# Patient Record
Sex: Female | Born: 1935 | Race: Black or African American | Hispanic: No | Smoking: Former smoker
Health system: Southern US, Community
[De-identification: ages and names within clinical notes are randomized; demographics above are authoritative.]

## PROBLEM LIST (undated history)

## (undated) DIAGNOSIS — C3490 Malignant neoplasm of unspecified part of unspecified bronchus or lung: Secondary | ICD-10-CM

## (undated) DIAGNOSIS — M1990 Unspecified osteoarthritis, unspecified site: Secondary | ICD-10-CM

## (undated) DIAGNOSIS — R0602 Shortness of breath: Secondary | ICD-10-CM

## (undated) DIAGNOSIS — I739 Peripheral vascular disease, unspecified: Secondary | ICD-10-CM

## (undated) DIAGNOSIS — E785 Hyperlipidemia, unspecified: Secondary | ICD-10-CM

## (undated) DIAGNOSIS — Z923 Personal history of irradiation: Secondary | ICD-10-CM

## (undated) DIAGNOSIS — J449 Chronic obstructive pulmonary disease, unspecified: Secondary | ICD-10-CM

## (undated) DIAGNOSIS — C801 Malignant (primary) neoplasm, unspecified: Secondary | ICD-10-CM

## (undated) DIAGNOSIS — I639 Cerebral infarction, unspecified: Secondary | ICD-10-CM

## (undated) DIAGNOSIS — I10 Essential (primary) hypertension: Secondary | ICD-10-CM

## (undated) HISTORY — DX: Cerebral infarction, unspecified: I63.9

## (undated) HISTORY — DX: Chronic obstructive pulmonary disease, unspecified: J44.9

## (undated) HISTORY — DX: Hyperlipidemia, unspecified: E78.5

## (undated) HISTORY — DX: Essential (primary) hypertension: I10

## (undated) HISTORY — PX: TONSILLECTOMY: SUR1361

## (undated) HISTORY — PX: HEMORRHOID SURGERY: SHX153

## (undated) HISTORY — DX: Unspecified osteoarthritis, unspecified site: M19.90

## (undated) HISTORY — PX: ABOVE KNEE LEG AMPUTATION: SUR20

## (undated) HISTORY — DX: Peripheral vascular disease, unspecified: I73.9

---

## 2006-11-20 ENCOUNTER — Encounter: Admission: RE | Admit: 2006-11-20 | Discharge: 2006-11-20 | Payer: Self-pay | Admitting: Family Medicine

## 2007-06-23 ENCOUNTER — Emergency Department (HOSPITAL_COMMUNITY): Admission: EM | Admit: 2007-06-23 | Discharge: 2007-06-23 | Payer: Self-pay | Admitting: Emergency Medicine

## 2007-08-13 ENCOUNTER — Ambulatory Visit: Payer: Self-pay | Admitting: Vascular Surgery

## 2007-11-21 ENCOUNTER — Encounter: Admission: RE | Admit: 2007-11-21 | Discharge: 2007-11-21 | Payer: Self-pay | Admitting: Geriatric Medicine

## 2008-02-11 ENCOUNTER — Ambulatory Visit: Payer: Self-pay | Admitting: Vascular Surgery

## 2008-02-24 ENCOUNTER — Ambulatory Visit (HOSPITAL_COMMUNITY): Admission: RE | Admit: 2008-02-24 | Discharge: 2008-02-24 | Payer: Self-pay | Admitting: Vascular Surgery

## 2008-02-24 ENCOUNTER — Ambulatory Visit: Payer: Self-pay | Admitting: Vascular Surgery

## 2008-03-17 ENCOUNTER — Ambulatory Visit: Payer: Self-pay | Admitting: Vascular Surgery

## 2008-05-26 ENCOUNTER — Ambulatory Visit: Payer: Self-pay | Admitting: Vascular Surgery

## 2008-09-22 ENCOUNTER — Ambulatory Visit: Payer: Self-pay | Admitting: Vascular Surgery

## 2008-11-23 ENCOUNTER — Encounter: Admission: RE | Admit: 2008-11-23 | Discharge: 2008-11-23 | Payer: Self-pay | Admitting: Geriatric Medicine

## 2009-01-29 ENCOUNTER — Emergency Department (HOSPITAL_COMMUNITY): Admission: EM | Admit: 2009-01-29 | Discharge: 2009-01-29 | Payer: Self-pay | Admitting: Emergency Medicine

## 2009-02-09 ENCOUNTER — Ambulatory Visit: Payer: Self-pay | Admitting: Vascular Surgery

## 2009-03-16 ENCOUNTER — Ambulatory Visit: Payer: Self-pay | Admitting: Vascular Surgery

## 2009-03-22 ENCOUNTER — Ambulatory Visit (HOSPITAL_COMMUNITY): Admission: RE | Admit: 2009-03-22 | Discharge: 2009-03-22 | Payer: Self-pay | Admitting: Vascular Surgery

## 2009-03-22 ENCOUNTER — Ambulatory Visit: Payer: Self-pay | Admitting: Vascular Surgery

## 2009-03-22 HISTORY — PX: ANGIOPLASTY / STENTING ILIAC: SUR31

## 2009-04-22 ENCOUNTER — Ambulatory Visit: Payer: Self-pay | Admitting: Vascular Surgery

## 2009-10-07 ENCOUNTER — Ambulatory Visit: Payer: Self-pay | Admitting: Vascular Surgery

## 2009-12-08 ENCOUNTER — Encounter: Admission: RE | Admit: 2009-12-08 | Discharge: 2009-12-08 | Payer: Self-pay | Admitting: Geriatric Medicine

## 2010-01-14 ENCOUNTER — Ambulatory Visit: Payer: Self-pay | Admitting: Vascular Surgery

## 2010-02-17 ENCOUNTER — Ambulatory Visit: Payer: Self-pay | Admitting: Vascular Surgery

## 2010-04-29 ENCOUNTER — Emergency Department (HOSPITAL_COMMUNITY): Admission: EM | Admit: 2010-04-29 | Discharge: 2010-04-29 | Payer: Self-pay | Admitting: Emergency Medicine

## 2010-06-06 ENCOUNTER — Encounter: Admission: RE | Admit: 2010-06-06 | Discharge: 2010-06-06 | Payer: Self-pay | Admitting: Neurology

## 2010-07-19 ENCOUNTER — Emergency Department (HOSPITAL_COMMUNITY)
Admission: EM | Admit: 2010-07-19 | Discharge: 2010-07-19 | Payer: Self-pay | Source: Home / Self Care | Admitting: Emergency Medicine

## 2010-07-26 ENCOUNTER — Encounter
Admission: RE | Admit: 2010-07-26 | Discharge: 2010-07-26 | Payer: Self-pay | Source: Home / Self Care | Attending: Internal Medicine | Admitting: Internal Medicine

## 2010-08-03 ENCOUNTER — Ambulatory Visit
Admission: RE | Admit: 2010-08-03 | Discharge: 2010-08-03 | Payer: Self-pay | Source: Home / Self Care | Attending: Vascular Surgery | Admitting: Vascular Surgery

## 2010-08-20 ENCOUNTER — Encounter: Payer: Self-pay | Admitting: Neurology

## 2010-10-10 LAB — POCT CARDIAC MARKERS
CKMB, poc: <1 ng/mL — ABNORMAL LOW (ref 1.0–8.0)
CKMB, poc: <1 ng/mL — ABNORMAL LOW (ref 1.0–8.0)
Myoglobin, poc: 40.8 ng/mL (ref 12–200)
Myoglobin, poc: 66.2 ng/mL (ref 12–200)
Troponin i, poc: <0.05 ng/mL (ref 0.00–0.09)
Troponin i, poc: <0.05 ng/mL (ref 0.00–0.09)

## 2010-10-10 LAB — POCT I-STAT, CHEM 8
BUN: 6 mg/dL (ref 6–23)
Calcium, Ion: 1.14 mmol/L (ref 1.12–1.32)
Chloride: 105 mEq/L (ref 96–112)
Creatinine, Ser: 1.1 mg/dL (ref 0.4–1.2)
Glucose, Bld: 99 mg/dL (ref 70–99)
HCT: 42 % (ref 36.0–46.0)
Hemoglobin: 14.3 g/dL (ref 12.0–15.0)
Potassium: 4.2 mEq/L (ref 3.5–5.1)
Sodium: 138 mEq/L (ref 135–145)
TCO2: 27 mmol/L (ref 0–100)

## 2010-10-13 LAB — POCT I-STAT, CHEM 8
BUN: 8 mg/dL (ref 6–23)
Calcium, Ion: 1.18 mmol/L (ref 1.12–1.32)
Chloride: 104 mEq/L (ref 96–112)
Creatinine, Ser: 0.8 mg/dL (ref 0.4–1.2)
Glucose, Bld: 115 mg/dL — ABNORMAL HIGH (ref 70–99)
HCT: 44 % (ref 36.0–46.0)
Hemoglobin: 15 g/dL (ref 12.0–15.0)
Potassium: 4.6 mEq/L (ref 3.5–5.1)
Sodium: 139 mEq/L (ref 135–145)
TCO2: 26 mmol/L (ref 0–100)

## 2010-11-05 LAB — POCT I-STAT, CHEM 8
BUN: 9 mg/dL (ref 6–23)
Calcium, Ion: 1.22 mmol/L (ref 1.12–1.32)
Chloride: 105 mEq/L (ref 96–112)
Creatinine, Ser: 0.9 mg/dL (ref 0.4–1.2)
Glucose, Bld: 92 mg/dL (ref 70–99)
HCT: 46 % (ref 36.0–46.0)
Hemoglobin: 15.6 g/dL — ABNORMAL HIGH (ref 12.0–15.0)
Potassium: 4.3 mEq/L (ref 3.5–5.1)
Sodium: 138 mEq/L (ref 135–145)
TCO2: 26 mmol/L (ref 0–100)

## 2010-11-06 LAB — BASIC METABOLIC PANEL
BUN: 11 mg/dL (ref 6–23)
CO2: 28 mEq/L (ref 19–32)
Calcium: 9.1 mg/dL (ref 8.4–10.5)
Chloride: 104 mEq/L (ref 96–112)
Creatinine, Ser: 0.92 mg/dL (ref 0.4–1.2)
GFR calc Af Amer: >60 mL/min (ref >60)
GFR calc non Af Amer: 59 mL/min — ABNORMAL LOW (ref >60)
Glucose, Bld: 105 mg/dL — ABNORMAL HIGH (ref 70–99)
Potassium: 4.4 mEq/L (ref 3.5–5.1)
Sodium: 139 mEq/L (ref 135–145)

## 2010-11-06 LAB — DIFFERENTIAL
Basophils Absolute: 0.1 10*3/uL (ref 0.0–0.1)
Basophils Relative: 1 % (ref 0–1)
Eosinophils Absolute: 0 10*3/uL (ref 0.0–0.7)
Eosinophils Relative: 1 % (ref 0–5)
Lymphocytes Relative: 37 % (ref 12–46)
Lymphs Abs: 1.8 10*3/uL (ref 0.7–4.0)
Monocytes Absolute: 0.3 10*3/uL (ref 0.1–1.0)
Monocytes Relative: 7 % (ref 3–12)
Neutro Abs: 2.7 10*3/uL (ref 1.7–7.7)
Neutrophils Relative %: 55 % (ref 43–77)

## 2010-11-06 LAB — CBC
HCT: 42.9 % (ref 36.0–46.0)
Hemoglobin: 14.6 g/dL (ref 12.0–15.0)
MCHC: 34.1 g/dL (ref 30.0–36.0)
MCV: 92.3 fL (ref 78.0–100.0)
Platelets: 218 10*3/uL (ref 150–400)
RBC: 4.65 MIL/uL (ref 3.87–5.11)
RDW: 12.8 % (ref 11.5–15.5)
WBC: 5 10*3/uL (ref 4.0–10.5)

## 2010-11-06 LAB — APTT: aPTT: 30 seconds (ref 24–37)

## 2010-11-06 LAB — PROTIME-INR
INR: 1 (ref 0.00–1.49)
Prothrombin Time: 13.7 seconds (ref 11.6–15.2)

## 2010-11-06 LAB — GLUCOSE, CAPILLARY: Glucose-Capillary: 99 mg/dL (ref 70–99)

## 2010-11-06 LAB — CK TOTAL AND CKMB (NOT AT ARMC)
CK, MB: 1.5 ng/mL (ref 0.3–4.0)
Relative Index: INVALID (ref 0.0–2.5)
Total CK: 99 U/L (ref 7–177)

## 2010-11-06 LAB — TROPONIN I: Troponin I: <0.01 ng/mL (ref 0.00–0.06)

## 2010-11-07 ENCOUNTER — Other Ambulatory Visit: Payer: Self-pay | Admitting: Internal Medicine

## 2010-11-07 DIAGNOSIS — Z1231 Encounter for screening mammogram for malignant neoplasm of breast: Secondary | ICD-10-CM

## 2010-12-12 ENCOUNTER — Ambulatory Visit
Admission: RE | Admit: 2010-12-12 | Discharge: 2010-12-12 | Disposition: A | Discharge status: Home / Self Care | Payer: Medicare Other | Source: Ambulatory Visit | Attending: Internal Medicine | Admitting: Internal Medicine

## 2010-12-12 DIAGNOSIS — Z1231 Encounter for screening mammogram for malignant neoplasm of breast: Secondary | ICD-10-CM

## 2010-12-13 NOTE — Op Note (Signed)
NAME:  [name], [name]                  ACCOUNT NO.:  [id number]   MEDICAL RECORD NO.:  [id number]          PATIENT TYPE:  AMB   LOCATION:  SDS                          FACILITY:  [facility]   PHYSICIAN:  Di Kindle. Edilia Bo, M.D.DATE OF BIRTH:  Nov 14, 1927   DATE OF PROCEDURE:  02/24/2008  DATE OF DISCHARGE:  02/24/2008                               OPERATIVE REPORT   PREOPERATIVE DIAGNOSIS:  Rest pain of the left leg with multilevel  arterial occlusive disease.   POSTOPERATIVE DIAGNOSIS:  Rest pain of the left leg with multilevel  arterial occlusive disease.   PROCEDURES:  1. Ultrasound-guided access to the left common femoral artery.  2. Aortogram with left lower extremity runoff.  3. Percutaneous transluminal angioplasty and stent to the left common      iliac artery with a Genesis PG [id number] stent.   TECHNIQUE:  The patient was taken to the [facility].  The left groin was  prepped and draped in the usual sterile fashion.  After the skin was  anesthetized with 1% lidocaine and under ultrasound guidance, the left  common femoral artery was cannulated and a guidewire was introduced into  the left iliac artery.  A 5-French sheath was introduced over the wire.  The [facility] wire would not advance into the aorta.  I used an angled  Glidewire to advance the wire into the infrarenal aorta.  Pigtail  catheter was then advanced over the wire, and a flush aortogram was  obtained.  The catheter was then positioned above the aortic bifurcation  and an oblique iliac projection was obtained.  There was a tight 90-95%  proximal left common iliac artery plaque, which was fairly extensive but  originated just below the origin of the common iliac artery.  The 5-  French sheath was exchanged for a 7-French sheath and the patient  received 3000 units of IV heparin.  A PG 397 stent was selected, and  this was positioned across the stenosis.  The dilator was used to  advance the sheath through the stenosis.  The  dilator was then removed.  The stent was positioned appropriately, and then the sheath was  retracted.  The balloon was inflated to 8 atmospheres for 30 seconds.  The balloon was then deflated.  Completion film showed no residual  stenosis.  Left lower extremity runoff film was then obtained.   FINDINGS:  Single renal arteries bilaterally with no significant renal  artery stenosis identified.  The infrarenal aorta is widely patent.  The  right common iliac, right external iliac, and hypogastric arteries are  patent.  On the left side, there was extensive plaque in the proximal  left common iliac artery, which was successfully ballooned and stented  as above.  The hypogastric and external iliac artery on the left are  patent.  The common femoral and deep femoral artery are patent on the  left.  The proximal superficial femoral artery is patent, is then  occluded in the mid thigh with reconstitution of the popliteal artery at  the level of the knee on single-vessel runoff via the  peroneal artery.  There are extensive collaterals reconstituting the below-knee popliteal  artery.   CONCLUSIONS:  1. Successful PTA and stenting of significant left common iliac artery      stenosis.  2. Superficial femoral artery occlusion with reconstitution of the      below-knee popliteal artery and single-vessel runoff via the      peroneal artery.      Di Kindle. Edilia Bo, M.D.  Electronically Signed     CSD/MEDQ  D:  02/24/2008  T:  02/25/2008  Job:  [id number]   cc:   Tanya D. Daphine Deutscher, M.D.

## 2010-12-13 NOTE — Procedures (Signed)
LOWER EXTREMITY ARTERIAL EVALUATION-SINGLE LEVEL   INDICATION:  Follow-up evaluation of left iliac PTA and stent.  Patient  denies claudication and rest pain.   HISTORY:  Diabetes:  No.  Cardiac:  No.  Hypertension:  Yes.  Smoking:  No.  Previous Surgery:  Left [facility] PTA and stent on 02/24/08 by Dr. Edilia Bo.  Patient has prosthetic right leg.   RESTING SYSTOLIC PRESSURES: (ABI)                          RIGHT                LEFT  Brachial:               134                  136  Anterior tibial:                             66  Posterior tibial:                            68 (0.50)  Peroneal:                                    64  DOPPLER WAVEFORM ANALYSIS:  Anterior tibial:                             Monophasic  Posterior tibial:                            Monophasic  Peroneal:                                    Monophasic   PREVIOUS ABI'S:  Date:  02/11/08  RIGHT:  LEFT:  0.35   IMPRESSION:  1. Patient with above knee amputation.  2. Left ankle brachial index is consistent with moderate-      moderate/severe arterial compromise, status post surgery, but has      increased from study on 02/11/08.   ___________________________________________  Di Kindle. Edilia Bo, M.D.   PB/MEDQ  D:  03/17/2008  T:  03/17/2008  Job:  [id number]

## 2010-12-13 NOTE — Assessment & Plan Note (Signed)
OFFICE VISIT   [name], [name]  DOB:  [birth year]                                       03/17/2008  ZOXWR#:[id number]   The patient has had a previous right above knee amputation and I have  been following her peripheral vascular disease on the left.  She  developed rest pain in the left foot and on 02/24/2008 underwent an  arteriogram which showed a 90% left common iliac artery stenosis which  was successfully ballooned and stented.  She was placed on Plavix for 3  months.  She also has a superficial femoral artery occlusion on the left  with extensive collaterals reconstituting with below knee popliteal  artery with single vessel runoff via the peroneal artery.  She comes in  for a followup visit.   She states that her symptoms in her left leg have improved markedly  since her PTA and stenting.  She is able to walk in the grocery store  without problems and has had no real claudication or rest pain.   PHYSICAL EXAMINATION:  On physical examination she has a palpable  femoral pulse on the left and good Doppler flow in the dorsalis pedis  position.   Overall, I am pleased with her progress.  Her ABI has gone from 35% to  50%.  I think it is unlikely she will require infrainguinal bypass.  I  will see her back in 6 months.  She knows to call sooner if she has  problems.   Di Kindle. Edilia Bo, M.D.  Electronically Signed   CSD/MEDQ  D:  03/17/2008  T:  03/18/2008  Job:  [id number]

## 2010-12-13 NOTE — Assessment & Plan Note (Signed)
OFFICE VISIT   [name], [name]  DOB:  November 04, 1927                                       01/14/2010  EAVWU#:[id number]   REASON FOR VISIT:  Left foot pain.   Patient is an 77 year old black female with a history of hypertension  and hypercholesterolemia.  She also has peripheral vascular disease with  known SFA occlusion.  She has had a PTA and stenting of her left common  iliac artery first in July 2009 for a tight 90% to 95% proximal stenosis  and then again in 03/22/2009.  On arteriogram, she had single vessel  runoff via her peroneal artery.  She was last seen by Dr. Edilia Bo in  04/22/2009, and ABI at that time was 60%,  up from 52%.  She also was  seen for a vascular lab study on 10/07/2009, and ABI at that time was  stable at 0.57.  She reports that around that time or just prior to  that, she began having left foot pain.  She felt that her symptoms  correlated with a new orthotic shoe that she has been wearing.  The pain  is somewhat progressed, and she describes a sharp pain on the top of her  left foot which occasionally radiates around her ankle.  She has mild  point tenderness on the dorsum of her left foot.  She says the pain is  fairly constant and does not necessarily seem worse with walking or with  rest.  She denies any known injury.  She takes Tylenol as needed for  pain with overall relief.  She has not had this type of pain before  March or February of this year.  She continues to have mild calf  cramping with ambulation only.  She does occasionally dangle at her  bedside during the night from her foot pain but does not notice a  significant change with the dependent position.  She has no open she has  no ulcerations.  Of note, she does have a history of right above-knee  amputation.  By her history, this was secondary to a right leg gangrene.   She is widowed.  She relies on public transportation.  She has 1 child.  She quit smoking in  2010.  She denies any premature cardiovascular  disease in her first-degree relatives.   REVIEW OF SYSTEMS:  Positive for weight gain, history of TIA, dizziness,  headaches and joint and muscle pain.  Apparently she said Dr. Edilia Bo  took her off Plavix, as she had some blood in her some stools, although  I did not see documentation of this.  She denies any recent  hematochezia.   ABIs today showed 0.61, which was no significant change from her prior  ABI in March 2011.  There was moderate to  severe decrease in the left  toe brachial index with a pulsatile PPG waveform of the left great toe  noted.   Physical exam findings show a heart rate of 66, respirations 20, oxygen  saturation 99% on room air.  She is a well-developed, pleasant female in  no acute distress.  Head is normocephalic atraumatic.  Her abdomen is  soft.  Her extremities show a 2+ femoral pulse in the left.  Evidence of  a right above-knee amputation.  Her left foot is warm with motor and  sensation  intact.  She does report some intermittent numbness of her  first to third toes on the left.  She has monophasic peroneal, posterior  tibial, and anterior tibial Doppler signals.  No ulcerations are noted.  On the dorsum of her left foot, there is a bony prominence just distal  to the ankle.  There is no surrounding erythema.  It is approximately 2  x 2 cm in size.  It is mildly tender to palpation.   Patient has moderately decreased left ABI, but this has remained stable  since September of last year.  She continues to have mild left calf  claudication symptoms but now presents with about a 3-56-month history of  left foot pain.  By her description, it does not seem exactly typical of  left foot rest pain, although the pain is fairly constant.  There does  not seem to be a significant relief in her symptoms with dependent  position of her legs, however.  She also reports that there is a  correlation between getting a new  orthotic shoe, and she does have point  tenderness on the dorsum of her foot.  Subsequently, I feel that  something other than her peripheral vascular disease may be contributing  to her left foot pain.  She is already established with a podiatrist,  Dr. Irving Shows at [facility].  I did called their office and  got her an appointment to see Dr. Wynelle Cleveland this coming Wednesday, June  22, at 1:45 p.m.  I will defer further workup of her left foot pain  until they have ruled out any orthopedic causes.  If her workup at the  podiatrist is negative, then I do feel that Dr. Edilia Bo will need to  reevaluate her and decide if he feels she would benefit for further  revascularization.  Per his last note, he felt that if her symptoms  progressed, she may need a left femoral-popliteal bypass grafting.   At this point she is supposed to see Dr. Edilia Bo in 1 month.  She can  continue as scheduled.  However, if the podiatry workup is negative, she  can call and set up an appointment sooner.   Jerold Coombe, PA   Larina Earthly, M.D.  Electronically Signed   AWZ/MEDQ  D:  01/14/2010  T:  01/14/2010  Job:  [id number]   cc:   Marlowe Aschoff, DPM

## 2010-12-13 NOTE — Assessment & Plan Note (Signed)
OFFICE VISIT   [name], [name]  DOB:  1928/06/15                                       02/11/2008  EAVWU#:[id number]   I saw the patient in the office today for continued followup of her  peripheral vascular disease.  She has had a previous right above knee  amputation and I have been following her multilevel arterial occlusive  disease in the left.  I had initially seen her in consultation on  January 13 of this year.  She comes in for a routine followup visit.  Since I saw her last she continues to have some moderate rest pain in  the left foot which appears to be quite tolerable and has not changed  since January.  She also has a slight callus on her left second toe but  no open ulcers.   REVIEW OF SYSTEMS:  On review of systems she has had no recent chest  pain, chest pressure, palpitations or arrhythmias.  She has had no  bronchitis, asthma or wheezing.  She has had no fever or chills.   PHYSICAL EXAMINATION:  General:  On physical examination this is a  pleasant 77 year old woman who appears her stated age.  Vital signs:  Blood pressure is 179/76, heart rate is 73.  Neck:  Neck is supple.  I  do not detect any carotid bruits.  Lungs:  Are clear bilaterally to  auscultation.  Cardiac:  She has a regular rate and rhythm.  Abdomen:  Soft and nontender.  She has a slightly diminished left femoral pulse.  I cannot palpate popliteal or pedal pulses on the left.  She has no open  ulcers on her left foot.  Her foot appears adequately perfused.   Doppler studies in our office today showed monophasic Doppler signals in  the left foot in a posterior tibial and dorsalis pedis position with an  ABI of 35% which has not changed significantly since January.   I have encouraged her to keep her skin well lubricated to prevent any  cracks.  I have encouraged her to stay as active as possible.  She  cannot take aspirin as she has a significant allergy from this.  I  plan  on seeing her back in 1 year.  She knows to call sooner if she has  problems.   Di Kindle. Edilia Bo, M.D.  Electronically Signed   CSD/MEDQ  D:  02/11/2008  T:  02/12/2008  Job:  [id number]

## 2010-12-13 NOTE — Assessment & Plan Note (Signed)
OFFICE VISIT   [name], [name]  DOB:  [birth year]                                       02/17/2010  EAVWU#:[id number]   I saw the patient in the office today for continued followup of her  peripheral vascular disease.  She has undergone a previous right above  the knee amputation and on the left side she has had PTA and stenting of  the left common iliac artery most recently in August of 2010.  She is  unable to take aspirin or Plavix because of bleeding problems in the  past.  She comes in for a routine visit.  She states that her biggest  complaint has been she has been having problems with her right above the  knee prosthesis and is frustrated with the company that she is working  with on this.  She would like referral to a different prosthetist.  In  essence, she is not happy with how she has to ambulate with her  prosthesis and they have not been willing to accommodate her concerns.  She does experience some mild claudication in the left calf which is  brought on by ambulation and relieved with rest.  She has had no rest  pain in the left foot and no nonhealing ulcers.   REVIEW OF SYSTEMS:  CARDIAC:  She has had no chest pain, chest pressure,  palpitations or arrhythmias.   PHYSICAL EXAMINATION:  General:  This is a pleasant 77 year old woman  who appears her stated age.  Vital signs:  Temperature is 98.  Blood  pressure is 180/71, heart rate is 71.  Lungs:  Are clear bilaterally to  auscultation.  Her right AKA has healed well.  On the left side she has  a normal femoral pulse.  I cannot palpate a popliteal or pedal pulse.  Based on previous arteriograms she has single vessel runoff via the  peroneal artery on the left side.  She also has a known superficial  femoral artery occlusion.   Her most recent Doppler study was on 01/14/2010 and showed an ABI of 61%  on the right which is stable compared to a previous ABI of 57%.  This is  consistent with her  SFA occlusion and tibial occlusive disease.  I have  scheduled her for a followup ABI in 6 months so we can continue to  follow her stent in the left common iliac artery.  She knows to call  sooner if she has problems.  In the meantime I have given her a  prescription for a new prosthetist at [employer].  Of note, she also quit  smoking 1 year ago.     Di Kindle. Edilia Bo, M.D.  Electronically Signed   CSD/MEDQ  D:  02/17/2010  T:  02/18/2010  Job:  [id number]   cc:   Marlowe Aschoff, DPM

## 2010-12-13 NOTE — Assessment & Plan Note (Signed)
OFFICE VISIT   [name], [name]  DOB:  [birth year]                                       09/22/2008  ZOXWR#:[id number]   I saw the patient in the office today for continued followup of her  peripheral vascular disease.  She has had a previous right below-the-  knee amputation.  In July of 2009 she had a PTA and stenting of the left  common iliac artery.  She has a superficial femoral artery occlusion  below this.  She has single vessel runoff on the left via the peroneal  artery.  She comes in for a routine followup visit.  She has had no  claudication or rest pain in the left leg.  She has been ambulating with  her prosthesis.  She has had no nonhealing ulcers.  Her only complaint  is some occasional heel pain.   REVIEW OF SYSTEMS:  She has had no recent chest pain, chest pressure,  palpitations or arrhythmias.  She has had no bronchitis, asthma or  wheezing.   PHYSICAL EXAMINATION:  General:  This is a pleasant 77 year old woman  who appears her stated age.  Vital signs:  Her blood pressure is 129/71,  heart rate is 65.  Lungs:  Are clear bilaterally to auscultation.  Cardiac:  She has a regular rate and rhythm.  She has a palpable left  femoral pulse with a warm, well-perfused left foot.  She has the  amputation on the right side.  She has no ischemic ulcers.   Duplex Doppler study in our office today shows an ABI of 55% on the left  which is not changed compared to her study in October of 2009.  She has  some mildly elevated velocities in the common iliac artery looks like  above her stent.  Will continue to follow her stent on our protocol.  If  her velocities increase significantly we would potentially have to  restudy her.  However, currently the velocities are only mildly  elevated.  I will see her back in 6 months.  She is on Plavix.   Di Kindle. Edilia Bo, M.D.  Electronically Signed   CSD/MEDQ  D:  09/22/2008  T:  09/23/2008  Job:  [id number]

## 2010-12-13 NOTE — Assessment & Plan Note (Signed)
OFFICE VISIT   [name], [name]  DOB:  01/12/1928                                       03/16/2009  ZOXWR#:[id number]   I saw the patient in the office today for continued followup of her  peripheral vascular disease.  This is a pleasant 77 year old woman who  had undergone PTA and stenting of a left common iliac artery stenosis on  02/24/2008.  This was for a tight 90-95% proximal left common iliac  artery stenosis which originated below the origin of the common iliac  artery.  This was addressed with a [id number] stent with an excellent result.  On her most recent followup study in February she was noted to have some  increased velocities in the common iliac artery and comes in for a  followup study today.  Of note, she is ambulatory with her prosthesis  which she has for her right leg.  She has not had any significant  claudication on the left although I think her activities are fairly  limited because she has been having some problems with her prosthesis.  She denies any history of rest pain or history of nonhealing ulcers on  the left.   REVIEW OF SYSTEMS:  She has had no recent chest pain, chest pressure,  palpitations or arrhythmias.  She has had no productive cough,  bronchitis, asthma or wheezing.   MEDICATIONS:  Of note, her medications do include Plavix.  They are also  listed on the medical history form in her chart.   PHYSICAL EXAMINATION:  This is a pleasant 77 year old woman who appears  her stated age.  Her blood pressure is 161/65, heart rate is 87.  Lungs  are clear bilaterally to auscultation.  On cardiac exam she has a  regular rate and rhythm.  Her abdomen  is soft and nontender.  She has  palpable femoral pulses.  I cannot palpate a popliteal or pedal pulses  on the left side.  On the right side she has an above the knee  amputation.   Today her ankle brachial index on the left has dropped slightly to 52%  from 55%.  She has a known  superficial femoral artery occlusion.  She  has elevated velocities in the proximal common iliac artery to 281  cm/sec and in the mid common iliac artery to 313 cm/sec.  There are no  significant elevated velocities in the external iliac artery.   Given the progression of the recurrent stenosis on the left side and a  drop in ABI I have recommend we proceed with arteriography and possible  left iliac artery angioplasty and stenting for her either recurrent  stenosis within the stent or development of a new stenosis adjacent to  the stent.  This has been scheduled for 03/22/2009.  We have discussed  the indications for the procedure and the potential complications.  All  of her questions were answered and she is agreeable to proceed.   Di Kindle. Edilia Bo, M.D.  Electronically Signed   CSD/MEDQ  D:  03/16/2009  T:  03/17/2009  Job:  [id number]

## 2010-12-13 NOTE — Assessment & Plan Note (Signed)
OFFICE VISIT   [name], [name]  DOB:  1927-12-06                                       04/22/2009  UEAVW#:[id number]   I saw the patient in the office today for followup after her recent PTA  and stenting of the left common iliac artery.  This a pleasant 77-year-  old woman who had a previous amputation on the right side who had  presented with left lower extremity claudication.  She had PTA and  stenting of the left common iliac artery in July of 2009 for a tight 90-  95% proximal left common iliac artery stenosis.  On a recent followup  study she was noted to have some increased velocities within the iliac  artery and she underwent an arteriogram on 03/22/2009 and was found to  have a recurrent left common iliac artery stenosis which was  successfully ballooned and stented.  On the left side she has a  superficial femoral artery occlusion with single vessel runoff via the  peroneal artery.  She comes in for a followup study.   PHYSICAL EXAMINATION:  Blood pressure is 154/76, heart rate is 72.  Lungs are clear bilaterally to auscultation.  On cardiac exam she has a  regular rate and rhythm.  She has a palpable femoral pulse on the left.  The left foot appears warm and adequately perfused without ischemic  ulcer.  ABI on the left has increased to 60% from 52% preoperatively.   Currently her symptoms are quite tolerable.  She has some mild  claudication in the left leg with a known superficial femoral artery  occlusion.  I would only consider left fem to below knee pop bypass  grafting if she developed progressive ischemia, rest pain or nonhealing  ulcer.  I plan on seeing her back in 6 months with followup ABIs.  She  knows to call sooner if she has problems.  Of note, she is on Plavix.   [name]. Edilia Bo, M.D.  Electronically Signed   CSD/MEDQ  D:  04/22/2009  T:  04/23/2009  Job:  [id number]

## 2010-12-13 NOTE — Assessment & Plan Note (Signed)
OFFICE VISIT   [name], Shilee F  DOB:  26-Aug-1927                                       05/26/2008  EAVWU#:[id number]   I saw the patient in the office today for continued followup of her  peripheral vascular disease.  She has undergone a PTA and stent of the  left common iliac artery.  Since I saw her last she has had no  claudication or rest pain on the left leg.  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  She  does complain of some pain in the stump on the right AKA.  This is  something that is relatively new.   REVIEW OF SYSTEMS:  On review of systems she has had no recent chest  pain, chest pressure, palpitations or arrhythmias.  She has had no  bronchitis, asthma or wheezing.   PHYSICAL EXAMINATION:  General:  On physical examination this is a  pleasant 77 year old woman who appears her stated age.  Vital signs:  Her blood pressure is 140/84, heart rate is 84.  Temperature 97.9.  Lungs:  Lungs are clear bilaterally to auscultation.  Cardiac:  She has  a regular rate and rhythm.  She has a palpable femoral pulse on the left  with a warm and well-perfused left foot.  No ischemic ulcers.  Her right  AKA looks fine.  I do not see any erythema or swelling.  There is good  soft tissue coverage over the bone.   I have recommended that she talk with [employer] about perhaps adjusting  her prosthesis as I do not see any problems with the amputation site  itself.  I will continue to follow her left iliac stent closely when she  is due back in 3 months.  I will see her back at that time.  She knows  to call sooner if she has any problems.  I have also given her a  prescription for mild compression stocking for the left leg.   Di Kindle. Edilia Bo, M.D.  Electronically Signed   CSD/MEDQ  D:  05/26/2008  T:  05/27/2008  Job:  [id number]

## 2010-12-13 NOTE — Procedures (Signed)
AORTA-ILIAC DUPLEX EVALUATION   INDICATION:  Follow up left common iliac artery stent.   HISTORY:  Diabetes:  No.  Cardiac:  No.  Hypertension:  Yes.  Smoking:  No.  Previous Surgery:  Left common iliac artery PTA and stent on 02/24/2008,  history of right above-knee amputation and left superficial femoral  artery occlusion.               SINGLE LEVEL ARTERIAL EXAM                              RIGHT                  LEFT  Brachial:                  156                    140  Anterior tibial:                                  86  Posterior tibial:                                 83  Peroneal:  Ankle/brachial index:                             0.55  Previous ABI/date:                                On 05/26/2008, 0.5   AORTA-ILIAC DUPLEX EXAM  Aorta - Proximal     51 cm/s  Aorta - Mid          69 cm/s  Aorta - Distal       61 cm/s   RIGHT                                   LEFT                    CIA-PROXIMAL          201 cm/s                    CIA-DISTAL            171 cm/s                    HYPOGASTRIC           Not visualized                    EIA-PROXIMAL          131 cm/s                    EIA-MID               102 cm/s                    EIA-DISTAL            132 cm/s   IMPRESSION:  1. Patent left common iliac artery stent with an increased velocity  of      201 cm/s noted in the proximal left common iliac artery.  2. Stable left ABI noted.  3. Decreased visualization of the aortoiliac system due to overlying      bowel gas patterns.   ___________________________________________  Di Kindle. Edilia Bo, M.D.   CH/MEDQ  D:  09/22/2008  T:  09/22/2008  Job:  [id number]

## 2010-12-13 NOTE — Consult Note (Signed)
NEW PATIENT CONSULTATION   [name], [name]  DOB:  1927-11-27                                       08/13/2007  JXBJY#:[id number]   HISTORY:  I saw the patient in the office today concerning her  peripheral vascular disease.  She was referred by Dr. Cephas Darby. Daphine Deutscher.  This is a pleasant 77 year old woman who had a right above the knee  amputation in the late 80s for gangrene of the right leg.  Since that  time she has been ambulatory with a prosthesis.  She denies any  claudication in the left calf and has had no rest pain in the left foot.  She does get some pain in her heel at night.  She has had no open ulcers  on the left foot.   PAST MEDICAL HISTORY:  Significant for hypertension,  hypercholesterolemia, glaucoma.  She denies any history of diabetes,  history of previous myocardial infarction, history of congestive heart  failure or history of COPD.   FAMILY HISTORY:  There is no history of premature cardiovascular disease  and no family history of diabetes.   SOCIAL HISTORY:  She is widowed.  She has 1 daughter.  She has smoked  for as long as she can remember, although she quit for about a year  after she had her amputation.  She smokes a pack per week.   REVIEW OF SYSTEMS:  Documented on the medical history form in her chart.   MEDICATIONS:  Documented on the medical history form in her chart.   PHYSICAL EXAMINATION:  General:  This is a pleasant 77 year old woman  who appears her stated age.  Vital signs:  Blood pressure is 166/71,  heart rate is 68.  HEENT:  Extraocular motions are intact.  There is no  cervical lymphadenopathy.  Neck:  Her neck is supple.  I do not detect  any carotid bruits.  Lungs:  Clear bilaterally to auscultation.  Cardiac:  She has a regular rate and rhythm.  Abdomen:  Soft and  nontender.  I could not palpate an aneurysm.  I could not palpate a  right femoral pulse.  She has a slightly diminished left femoral pulses.  I could  not palpable popliteal or pedal pulses on the left side.  She  has an AKA on the right.  She does have monophasic Doppler signals in  the posterior tibial and anterior tibial position on the left.  Vascular  study in our office today shows an ABI of 38% on the left.  She has no  open wound on her left foot.  She has no significant lower extremity  swelling.  Neurological:  Nonfocal.   This patient has evidence of multilevel arterial occlusive disease but  currently her symptoms are quite tolerable and she has no evidence of  limb threatening ischemia.  If she developed rest pain or nonhealing  ulcer we could consider arteriography.  She might potentially have an  iliac stenosis which is amenable to angioplasty.  However, currently I  would not recommend vascular intervention unless her symptoms progress.  I plan on seeing her back in 6 months.  She knows to call sooner if she  has problems.   Di Kindle. Edilia Bo, M.D.  Electronically Signed   CSD/MEDQ  D:  08/13/2007  T:  08/14/2007  Job:  [id number]  cc:   Tripp, Dr  Cephas Darby. Daphine Deutscher, M.D.

## 2010-12-13 NOTE — Procedures (Signed)
AORTA-ILIAC DUPLEX EVALUATION   INDICATION:  Follow up left common iliac artery stent.   HISTORY:  Diabetes:  No.  Cardiac:  No.  Hypertension:  Yes.  Smoking:  No.  Previous Surgery:  Left [facility] PTA/stent, 02/24/08.  History of right AKA.               SINGLE LEVEL ARTERIAL EXAM                              RIGHT                  LEFT  Brachial:                  144                    142  Anterior tibial:                                  75  Posterior tibial:                                 68  Peroneal:  Ankle/brachial index:      AKA                    0.52  Previous ABI/date:         09/22/08, AKA          09/22/08, 0.55   AORTA-ILIAC DUPLEX EXAM  Aorta - Proximal     79 cm/s  Aorta - Mid          74 cm/s  Aorta - Distal       52 cm/s   RIGHT                                   LEFT                    CIA-PROXIMAL          P = 281, M = 313                    CIA-DISTAL            124 cm/s                    HYPOGASTRIC           134 cm/s                    EIA-PROXIMAL          97 cm/s                    EIA-MID               126 cm/s                    EIA-DISTAL            114 cm/s   IMPRESSION:  1. Technically difficult study due to non-NPO and bowel gas.  2. Right above-the-knee amputation.  3. Left ankle brachial index appears stable from previous study.  4. Increase in left common iliac artery velocities suggestive  of >50%      stenosis.  5. Patent left common iliac artery stent.   ___________________________________________  Di Kindle. Edilia Bo, M.D.   AS/MEDQ  D:  02/09/2009  T:  02/09/2009  Job:  [id number]

## 2010-12-13 NOTE — Op Note (Signed)
NAME:  [name], Nicole                  ACCOUNT NO.:  [id number]   MEDICAL RECORD NO.:  [id number]          PATIENT TYPE:  AMB   LOCATION:  SDS                          FACILITY:  [facility]   PHYSICIAN:  Di Kindle. Edilia Bo, M.D.DATE OF BIRTH:  10/03/1927   DATE OF PROCEDURE:  DATE OF DISCHARGE:  03/22/2009                               OPERATIVE REPORT   PREOPERATIVE DIAGNOSIS:  Left common iliac artery stenosis with  multilevel arterial occlusive disease.   POSTOPERATIVE DIAGNOSIS:  Left common iliac artery stenosis with  multilevel arterial occlusive disease.   PROCEDURE:  1. Ultrasound-guided access to the left common femoral artery.  2. Aortogram with left lower extremity runoff.  3. Percutaneous transluminal angioplasty and stenting of the left      common iliac artery with a [id number] Cordis stent with postdilatation      with an 8 x 2 balloon.   SURGEON:  Di Kindle. Edilia Bo, MD   ANESTHESIA:  Local with sedation.   TECHNIQUE:  The patient was taken to the [facility] and received a milligram  of Versed.  After this, the left groin was prepped and draped in the  usual sterile fashion.  After the skin was anesthetized with 1%  lidocaine and under ultrasound guidance the left common femoral artery  was cannulated and a guidewire introduced into the infrarenal aorta  under fluoroscopic control.  A 5-French sheath was introduced over the  wire.  A pigtail catheter was positioned at the L1 vertebral body and  flush aortogram obtained.  The catheter was then repositioned above the  aortic bifurcation and an oblique iliac projection was obtained.  There  appeared to be at approximately 50% stenosis just proximal to the  previous common iliac artery stent.  There was also some irregularity  within the proximal stent.  I elected to balloon and re-stent this area.  I selected a Cordis PG [id number] stent which was selected and the 5-French  sheath was exchanged for a long 6-French sheath and then  the patient  received 2000 units of IV heparin.  The stent was positioned right at  the proximal left common iliac artery extending into the previous stent.  The sheath was retracted.  The stent was deployed to 8 atmospheres for  30 seconds.  I then did a completion run and elected to balloon with an  8 mm balloon within the stented areas.  This was done with 8 atmospheres  for 30 seconds in several areas.  Completion arteriogram showed an  excellent result with no residual stenosis.  Next, left lower extremity  runoff film was obtained.   FINDINGS:  1. Proximal left common iliac artery stenosis and proximal in-stent      stenosis on the left which was successfully ballooned and stented      as described above.  2. The left external and common femoral and deep femoral arteries are      patent.  The superficial femoral artery is occluded in the proximal      thigh, and there is reconstitution at the popliteal artery at  the      level of the knee.  There is single-vessel runoff on the left via      the peroneal artery.  On the right side the common iliac and      external iliac artery are patent.  The common femoral artery then      becomes quite diminutive in size.   CONCLUSIONS:  1. Successful PTA and stenting of the left common iliac artery.  2. Superficial femoral artery occlusion with single-vessel runoff via      the peroneal artery on the left.      Di Kindle. Edilia Bo, M.D.  Electronically Signed     CSD/MEDQ  D:  03/22/2009  T:  03/22/2009  Job:  [id number]

## 2011-02-16 ENCOUNTER — Encounter: Payer: Self-pay | Admitting: Vascular Surgery

## 2011-02-17 ENCOUNTER — Encounter: Payer: Self-pay | Admitting: Vascular Surgery

## 2011-02-22 ENCOUNTER — Ambulatory Visit (INDEPENDENT_AMBULATORY_CARE_PROVIDER_SITE_OTHER): Payer: Medicare Other | Admitting: Vascular Surgery

## 2011-02-22 ENCOUNTER — Encounter (INDEPENDENT_AMBULATORY_CARE_PROVIDER_SITE_OTHER): Payer: Medicare Other

## 2011-02-22 VITALS — BP 156/80 | HR 68 | Temp 97.9°F | Ht 64.0 in | Wt 154.0 lb

## 2011-02-22 DIAGNOSIS — I739 Peripheral vascular disease, unspecified: Secondary | ICD-10-CM

## 2011-02-22 DIAGNOSIS — I70219 Atherosclerosis of native arteries of extremities with intermittent claudication, unspecified extremity: Secondary | ICD-10-CM

## 2011-02-22 DIAGNOSIS — Z48812 Encounter for surgical aftercare following surgery on the circulatory system: Secondary | ICD-10-CM

## 2011-02-22 NOTE — Progress Notes (Signed)
Subjective:     Patient ID: [name], female   DOB: [birth year], 77 y.o.   MRN: [id number]  HPI I saw this patient in the office today with a chief complaint of cramps in the left foot. She was referred by Dr. Marlowe Aschoff. The cramps began approximately 3 months ago. They were gradual in onset. Her symptoms, come and go. She underwent a right above-knee amputation in [place] in the 80s. She is ambulatory with her prosthesis, although she has been having problems lately with the function of her prosthesis. I do not get any clear-cut history of claudication in the left leg, rest pain, or nonhealing ulcers in the left foot.  Of note this patient has had a previous common iliac artery angioplasty and stent placed and he is on Plavix.  Review of Systems  Constitutional: Negative for fever and chills.  Respiratory: Negative for chest tightness and shortness of breath.   Cardiovascular: Negative for chest pain and palpitations.       Objective:   Physical Exam  Constitutional: She is oriented to person, place, and time.  Neck: Neck supple. No JVD present. No thyromegaly present.  Cardiovascular: Normal rate, regular rhythm and normal heart sounds.  Exam reveals no friction rub.   No murmur heard. Pulses:      Radial pulses are 2+ on the right side, and 2+ on the left side.       Femoral pulses are 2+ on the right side, and 2+ on the left side.      Popliteal pulses are 0 on the left side.       Dorsalis pedis pulses are 0 on the left side.       Posterior tibial pulses are 0 on the left side.       She has a right above-the-knee amputation.  Pulmonary/Chest: Breath sounds normal. She has no wheezes. She has no rales.  Abdominal: Soft. Bowel sounds are normal. There is no tenderness.       No palpable aneurysm detected.  Musculoskeletal: She exhibits no edema.  Lymphadenopathy:    She has no cervical adenopathy.  Neurological: She is alert and oriented to person, place, and time.  She has normal strength. No sensory deficit.  Skin: No lesion and no rash noted.       Assessment:     I have independently interpreted her arterial Doppler study today which shows that she has an ankle-brachial index of 0.66 on the left. She has a biphasic dorsalis pedis and posterior tibial signal.  I have reviewed her previous arteriogram which shows that she has a known left superficial femoral artery occlusion with single vessel runoff via the peroneal artery on the left.  I believe that she has stable infrainguinal arterial occlusive disease. I do not think any further vascular workup is indicated at this time. I have recommended a followup at-year-old Doppler study in one year and I have ordered this study. Fortunately, she quit smoking 2 years ago. I have encouraged her to stay as active as possible.    Plan:       Followup in one year with ankle-brachial indices. The patient knows to call sooner if she has problems.

## 2011-02-22 NOTE — Progress Notes (Unsigned)
Pt c/o increase cramping in Left foot.   Prosthetic problems with fitting on Left AKA ( Went to [employer]) Pt states she fell in February and her leg has been hurting since, no xrays or treatment done then.

## 2011-04-28 LAB — POCT I-STAT, CHEM 8
BUN: 13
Calcium, Ion: 1.11 — ABNORMAL LOW
Chloride: 104
Creatinine, Ser: 1
Glucose, Bld: 99
HCT: 48 — ABNORMAL HIGH
Hemoglobin: 16.3 — ABNORMAL HIGH
Potassium: 3.3 — ABNORMAL LOW
Sodium: 139
TCO2: 25

## 2011-05-09 LAB — URINALYSIS, ROUTINE W REFLEX MICROSCOPIC
Bilirubin Urine: NEGATIVE
Glucose, UA: NEGATIVE
Hgb urine dipstick: NEGATIVE
Ketones, ur: NEGATIVE
Nitrite: NEGATIVE
Protein, ur: NEGATIVE
Specific Gravity, Urine: 1.013
Urobilinogen, UA: 0.2
pH: 6

## 2011-05-09 LAB — DIFFERENTIAL
Basophils Absolute: 0
Basophils Relative: 1
Eosinophils Absolute: 0 — ABNORMAL LOW
Eosinophils Relative: 1
Lymphocytes Relative: 33
Lymphs Abs: 2.3
Monocytes Absolute: 0.3
Monocytes Relative: 5
Neutro Abs: 4.3
Neutrophils Relative %: 61

## 2011-05-09 LAB — CBC
HCT: 44.3
Hemoglobin: 14.6
MCHC: 33.1
MCV: 90.8
Platelets: 218
RBC: 4.88
RDW: 12.7
WBC: 6.9

## 2011-05-09 LAB — BASIC METABOLIC PANEL
BUN: 14
CO2: 28
Calcium: 9.2
Chloride: 109
Creatinine, Ser: 1.06
GFR calc Af Amer: >60
GFR calc non Af Amer: 50 — ABNORMAL LOW
Glucose, Bld: 98
Potassium: 4.5
Sodium: 143

## 2011-08-17 ENCOUNTER — Other Ambulatory Visit: Payer: Self-pay | Admitting: Internal Medicine

## 2011-08-17 ENCOUNTER — Ambulatory Visit
Admission: RE | Admit: 2011-08-17 | Discharge: 2011-08-17 | Disposition: A | Discharge status: Home / Self Care | Payer: Medicare Other | Source: Ambulatory Visit | Attending: Internal Medicine | Admitting: Internal Medicine

## 2011-08-17 DIAGNOSIS — G8929 Other chronic pain: Secondary | ICD-10-CM

## 2011-09-01 HISTORY — PX: COLONOSCOPY: SHX174

## 2011-10-04 ENCOUNTER — Ambulatory Visit (INDEPENDENT_AMBULATORY_CARE_PROVIDER_SITE_OTHER): Payer: Medicare Other | Admitting: Cardiology

## 2011-10-04 ENCOUNTER — Encounter: Payer: Self-pay | Admitting: Cardiology

## 2011-10-04 DIAGNOSIS — I10 Essential (primary) hypertension: Secondary | ICD-10-CM

## 2011-10-04 DIAGNOSIS — R079 Chest pain, unspecified: Secondary | ICD-10-CM

## 2011-10-04 DIAGNOSIS — E785 Hyperlipidemia, unspecified: Secondary | ICD-10-CM

## 2011-10-04 NOTE — Assessment & Plan Note (Signed)
Continue statin. Management per primary care. 

## 2011-10-04 NOTE — Assessment & Plan Note (Signed)
They increase with moving her left upper extremity. Her chest wall pain is reproduced with palpation. She has no substernal symptoms and no exertional chest pain. I do not think further cardiac testing is indicated.

## 2011-10-04 NOTE — Assessment & Plan Note (Signed)
Continue present medications. She will monitor her blood pressure and her medications can be adjusted by primary care.

## 2011-10-04 NOTE — Progress Notes (Signed)
HPI: 78 year old female with no prior cardiac history for evaluation of chest and shoulder pain. Patient states she had a negative cardiac evaluation approximately 10 years ago. I do not have those records available. She complains of left shoulder pain for at least one year. The pain is persistent and increases with certain movements. There is no associated symptoms. She also has some pain in the left lateral chest that increases when lying on her left side. She does not have any substernal pain, exertional chest pain, dyspnea on exertion, orthopnea, PND, pedal edema, syncope. Because of her symptoms we were asked to further evaluate.  Current Outpatient Prescriptions  Medication Sig Dispense Refill  . Acetaminophen (TYLENOL EXTRA STRENGTH PO) Take by mouth as needed.      . cholecalciferol (VITAMIN D) 1000 UNITS tablet Take 1,000 Units by mouth daily.      . clopidogrel (PLAVIX) 75 MG tablet Take 75 mg by mouth daily.        . furosemide (LASIX) 20 MG tablet prn      . lisinopril (PRINIVIL,ZESTRIL) 40 MG tablet Take 40 mg by mouth daily.      . meclizine (ANTIVERT) 25 MG tablet Take 25 mg by mouth 3 (three) times daily as needed.        Marland Kitchen omeprazole (PRILOSEC) 20 MG capsule Take 20 mg by mouth daily.        Bertram Gala Glycol-Propyl Glycol (SYSTANE) 0.4-0.3 % SOLN Apply to eye.        . Sennosides (SENNA LAX PO) Take by mouth as needed.      . simvastatin (ZOCOR) 40 MG tablet Take 40 mg by mouth every evening.      . traMADol (ULTRAM) 50 MG tablet Take 50 mg by mouth as needed.          Allergies  Allergen Reactions  . Aspirin   . Morphine And Related     Past Medical History  Diagnosis Date  . Stroke   . Peripheral vascular disease, unspecified   . Arthritis   . Hypertension   . Hyperlipidemia   . Glaucoma     Past Surgical History  Procedure Date  . Above knee leg amputation     Right  . Angioplasty / stenting iliac 03/22/09    Aortogram-  left common iliac artery  .  Hemorrhoid surgery   . Tonsillectomy     History   Social History  . Marital Status: Widowed    Spouse Name: N/A    Number of Children: 1  . Years of Education: N/A   Occupational History  .     Social History Main Topics  . Smoking status: Former Smoker    Types: Cigarettes    Quit date: 02/28/2009  . Smokeless tobacco: Not on file  . Alcohol Use: No  . Drug Use: No  . Sexually Active:    Other Topics Concern  . Not on file   Social History Narrative  . No narrative on file    Family History  Problem Relation Age of Onset  . Heart disease Brother     unknown type    ROS: Some pain in right stump from prosthetic but no fevers or chills, productive cough, hemoptysis, dysphasia, odynophagia, melena, hematochezia, dysuria, hematuria, rash, seizure activity, orthopnea, PND, pedal edema, claudication. Remaining systems are negative.  Physical Exam:   Blood pressure 160/80, pulse 79, weight 151 lb (68.493 kg).  General:  Well developed/well nourished in NAD Skin warm/dry Patient  not depressed No peripheral clubbing Back-normal HEENT-normal/normal eyelids Neck supple/normal carotid upstroke bilaterally; no bruits; no JVD; no thyromegaly chest - CTA/ normal expansion; pain reproduced with palpation under left breast. CV - RRR/normal S1 and S2; no murmurs, rubs or gallops;  PMI nondisplaced Abdomen -NT/ND, no HSM, no mass, + bowel sounds, no bruit 2+ femoral pulses, no bruits Ext-no edema, chords; status post amputation above the knee on the right, decreased dorsalis pedis on the left. Neuro-grossly nonfocal  ECG  normal sinus rhythm at a rate of 79. Incomplete left bundle branch block.

## 2011-11-06 ENCOUNTER — Other Ambulatory Visit: Payer: Self-pay | Admitting: Geriatric Medicine

## 2011-11-06 DIAGNOSIS — Z1231 Encounter for screening mammogram for malignant neoplasm of breast: Secondary | ICD-10-CM

## 2011-12-13 ENCOUNTER — Ambulatory Visit
Admission: RE | Admit: 2011-12-13 | Discharge: 2011-12-13 | Disposition: A | Discharge status: Home / Self Care | Payer: Medicare Other | Source: Ambulatory Visit | Attending: Geriatric Medicine | Admitting: Geriatric Medicine

## 2011-12-13 DIAGNOSIS — Z1231 Encounter for screening mammogram for malignant neoplasm of breast: Secondary | ICD-10-CM

## 2012-02-15 ENCOUNTER — Other Ambulatory Visit: Payer: Self-pay

## 2012-02-15 ENCOUNTER — Encounter (HOSPITAL_COMMUNITY): Payer: Self-pay | Admitting: *Deleted

## 2012-02-15 ENCOUNTER — Emergency Department (HOSPITAL_COMMUNITY)
Admission: EM | Admit: 2012-02-15 | Discharge: 2012-02-15 | Disposition: A | Discharge status: Home / Self Care | Payer: Medicare Other | Attending: Emergency Medicine | Admitting: Emergency Medicine

## 2012-02-15 ENCOUNTER — Emergency Department (HOSPITAL_COMMUNITY): Payer: Medicare Other

## 2012-02-15 DIAGNOSIS — R5381 Other malaise: Secondary | ICD-10-CM | POA: Insufficient documentation

## 2012-02-15 DIAGNOSIS — Z8673 Personal history of transient ischemic attack (TIA), and cerebral infarction without residual deficits: Secondary | ICD-10-CM | POA: Insufficient documentation

## 2012-02-15 DIAGNOSIS — R079 Chest pain, unspecified: Secondary | ICD-10-CM

## 2012-02-15 DIAGNOSIS — I10 Essential (primary) hypertension: Secondary | ICD-10-CM | POA: Insufficient documentation

## 2012-02-15 DIAGNOSIS — E785 Hyperlipidemia, unspecified: Secondary | ICD-10-CM | POA: Insufficient documentation

## 2012-02-15 DIAGNOSIS — R51 Headache: Secondary | ICD-10-CM | POA: Insufficient documentation

## 2012-02-15 DIAGNOSIS — R0602 Shortness of breath: Secondary | ICD-10-CM | POA: Insufficient documentation

## 2012-02-15 DIAGNOSIS — I739 Peripheral vascular disease, unspecified: Secondary | ICD-10-CM | POA: Insufficient documentation

## 2012-02-15 DIAGNOSIS — S78119A Complete traumatic amputation at level between unspecified hip and knee, initial encounter: Secondary | ICD-10-CM | POA: Insufficient documentation

## 2012-02-15 DIAGNOSIS — R5383 Other fatigue: Secondary | ICD-10-CM | POA: Insufficient documentation

## 2012-02-15 DIAGNOSIS — Z79899 Other long term (current) drug therapy: Secondary | ICD-10-CM | POA: Insufficient documentation

## 2012-02-15 LAB — BASIC METABOLIC PANEL
BUN: 8 mg/dL (ref 6–23)
CO2: 24 mEq/L (ref 19–32)
Calcium: 9.1 mg/dL (ref 8.4–10.5)
Chloride: 100 mEq/L (ref 96–112)
Creatinine, Ser: 0.78 mg/dL (ref 0.50–1.10)
GFR calc Af Amer: 88 mL/min — ABNORMAL LOW (ref >90)
GFR calc non Af Amer: 76 mL/min — ABNORMAL LOW (ref >90)
Glucose, Bld: 91 mg/dL (ref 70–99)
Potassium: 4.2 mEq/L (ref 3.5–5.1)
Sodium: 136 mEq/L (ref 135–145)

## 2012-02-15 LAB — CBC
HCT: 40.5 % (ref 36.0–46.0)
Hemoglobin: 13.6 g/dL (ref 12.0–15.0)
MCH: 30 pg (ref 26.0–34.0)
MCHC: 33.6 g/dL (ref 30.0–36.0)
MCV: 89.2 fL (ref 78.0–100.0)
Platelets: 262 10*3/uL (ref 150–400)
RBC: 4.54 MIL/uL (ref 3.87–5.11)
RDW: 13 % (ref 11.5–15.5)
WBC: 5.9 10*3/uL (ref 4.0–10.5)

## 2012-02-15 LAB — D-DIMER, QUANTITATIVE: D-Dimer, Quant: 0.75 ug/mL-FEU — ABNORMAL HIGH (ref 0.00–0.48)

## 2012-02-15 LAB — POCT I-STAT TROPONIN I: Troponin i, poc: 0 ng/mL (ref 0.00–0.08)

## 2012-02-15 LAB — CARDIAC PANEL(CRET KIN+CKTOT+MB+TROPI)
CK, MB: 2.8 ng/mL (ref 0.3–4.0)
Relative Index: 2.2 (ref 0.0–2.5)
Total CK: 126 U/L (ref 7–177)
Troponin I: <0.3 ng/mL (ref <0.30)

## 2012-02-15 MED ORDER — PANTOPRAZOLE SODIUM 20 MG PO TBEC: 20.0000 mg | DELAYED_RELEASE_TABLET | Freq: Every day | ORAL | Status: DC

## 2012-02-15 MED ORDER — HYDRALAZINE HCL 10 MG PO TABS: 10.0000 mg | ORAL_TABLET | Freq: Two times a day (BID) | ORAL | Status: DC

## 2012-02-15 MED ORDER — XENON XE 133 GAS
20.0000 | GAS_FOR_INHALATION | Freq: Once | RESPIRATORY_TRACT | Status: AC | PRN
  Administered 2012-02-15: 20 via RESPIRATORY_TRACT

## 2012-02-15 MED ORDER — TECHNETIUM TO 99M ALBUMIN AGGREGATED
3.0000 | Freq: Once | INTRAVENOUS | Status: AC | PRN
  Administered 2012-02-15: 3 via INTRAVENOUS

## 2012-02-15 MED ORDER — ALBUTEROL SULFATE HFA 108 (90 BASE) MCG/ACT IN AERS
2.0000 | INHALATION_SPRAY | Freq: Four times a day (QID) | RESPIRATORY_TRACT | Status: DC
  Administered 2012-02-15: 2 via RESPIRATORY_TRACT
  Filled 2012-02-15: qty 6.7

## 2012-02-15 NOTE — ED Provider Notes (Addendum)
History     CSN: [id number]  Arrival date & time 02/15/12  1017   First MD Initiated Contact with Patient 02/15/12 1100      Chief Complaint  Patient presents with  . Shortness of Breath  . Headache   HPI Pt recently had a change in her medication.  Her hydralizine was increased from 10 mg twice daily to 50 mg twice daily.  She has been having trouble with shortness of breath.  She noticed it after first taking the pill on Tuesday.  It has been steady.  She feels worse with exertion but she feels like she is choking more and she gets more "flickering" in her head.  No chest pain although she did have discomfort in her upper abdomen and back yesterday. No history of heart problems.  Pt has had cardiac testing in the past year that was normal. No fever, no vomiting or diarrhea.  Past Medical History  Diagnosis Date  . Stroke   . Peripheral vascular disease, unspecified   . Arthritis   . Hypertension   . Hyperlipidemia   . Glaucoma     Past Surgical History  Procedure Date  . Above knee leg amputation     Right  . Angioplasty / stenting iliac 03/22/09    Aortogram-  left common iliac artery  . Hemorrhoid surgery   . Tonsillectomy     Family History  Problem Relation Age of Onset  . Heart disease Brother     unknown type    History  Substance Use Topics  . Smoking status: Former Smoker    Types: Cigarettes    Quit date: 02/28/2009  . Smokeless tobacco: Not on file  . Alcohol Use: No    OB History    Grav Para Term Preterm Abortions TAB SAB Ect Mult Living                  Review of Systems  Constitutional: Negative for fever.  All other systems reviewed and are negative.    Allergies  Aspirin and Morphine and related  Home Medications   Current Outpatient Rx  Name Route Sig Dispense Refill  . TYLENOL EXTRA STRENGTH PO Oral Take by mouth as needed.    Marland Kitchen VITAMIN D 1000 UNITS PO TABS Oral Take 1,000 Units by mouth daily.    Marland Kitchen CLOPIDOGREL BISULFATE 75  MG PO TABS Oral Take 75 mg by mouth 3 (three) times a week. Mon, Wed and Friday    . FUROSEMIDE 20 MG PO TABS Oral Take 40 mg by mouth as needed. Twice weekly as needed for fluid    . HYDRALAZINE HCL 50 MG PO TABS Oral Take 50 mg by mouth 2 (two) times daily.    Marland Kitchen LISINOPRIL 40 MG PO TABS Oral Take 40 mg by mouth daily.    Marland Kitchen MECLIZINE HCL 25 MG PO TABS Oral Take 25 mg by mouth 3 (three) times daily as needed. For dizziness    . OMEPRAZOLE 20 MG PO CPDR Oral Take 20 mg by mouth daily.      Marland Kitchen POLYETHYL GLYCOL-PROPYL GLYCOL 0.4-0.3 % OP SOLN Ophthalmic Apply to eye.      . SENNA LAX PO Oral Take 1-2 tablets by mouth daily as needed. For constipation    . SIMVASTATIN 40 MG PO TABS Oral Take 40 mg by mouth every evening.    Marland Kitchen TRAMADOL HCL 50 MG PO TABS Oral Take 50 mg by mouth daily as needed. For pain  BP 182/66  Pulse 66  Temp 97.5 F (36.4 C) (Oral)  Resp 24  Ht 5\' 4"  (1.626 m)  SpO2 100%  Physical Exam  Nursing note and vitals reviewed. Constitutional: She appears well-developed and well-nourished. No distress.  HENT:  Head: Normocephalic and atraumatic.  Right Ear: External ear normal.  Left Ear: External ear normal.  Eyes: Conjunctivae are normal. Right eye exhibits no discharge. Left eye exhibits no discharge. No scleral icterus.  Neck: Neck supple. No tracheal deviation present.  Cardiovascular: Normal rate, regular rhythm and intact distal pulses.   Pulmonary/Chest: Effort normal and breath sounds normal. No stridor. No respiratory distress. She has no wheezes. She has no rales.  Abdominal: Soft. Bowel sounds are normal. She exhibits no distension. There is no tenderness. There is no rebound and no guarding.  Musculoskeletal: She exhibits no edema and no tenderness.       Artificial limb RLE  Neurological: She is alert. She has normal strength. No sensory deficit. Cranial nerve deficit:  no gross defecits noted. She exhibits normal muscle tone. She displays no seizure  activity. Coordination normal.  Skin: Skin is warm and dry. No rash noted.  Psychiatric: She has a normal mood and affect.    ED Course  Procedures (including critical care time) EKG Rate 63 Normal sinus rhythm Low voltage QRS No significant change was found from 07-19-10   Labs Reviewed  BASIC METABOLIC PANEL - Abnormal; Notable for the following:    GFR calc non Af Amer 76 (*)     GFR calc Af Amer 88 (*)     All other components within normal limits  D-DIMER, QUANTITATIVE - Abnormal; Notable for the following:    D-Dimer, Quant 0.75 (*)     All other components within normal limits  CBC   Dg Chest 2 View  02/15/2012  *RADIOLOGY REPORT*  Clinical Data: Shortness of breath and weakness.  CHEST - 2 VIEW  Comparison: 01/29/2009.  Findings: Trachea is midline.  Heart size normal.  Thoracic aorta is calcified.  There is added density in the apex of the right hemithorax, in the region of the right first costochondral junction, more prominent than on 01/29/2009.  Minimal scarring at the left lung base.  Lungs are otherwise clear.  No pleural fluid. Degenerative changes are seen in the mid thoracic spine.  IMPRESSION: Added density in the region of the right first costochondral junction, increased in prominence from 01/29/2009.  In this patient with a smoking history, non emergent of CT chest without contrast would be helpful in further evaluation, as clinically indicated, as a pulmonary nodule cannot be excluded. These results will be called to the ordering clinician or representative by the Radiologist Assistant, and communication documented in the PACS Dashboard.  Original Report Authenticated By: Reyes Ivan, M.D.      MDM  Reviewed old records.  Pt has been seen by [facility] in the past year.  Last cath per their records was 10 years ago at another facility.  Pt presents with choking sensation , chest discomfort.  Will discuss with [facility] cardiology.  VQ scan ordered to  assess for possible PE considering her increased d dimer although no other risk factors.  Pt does not have a large enough IV for ct angio chest.    3:14 PM  Pt was seen by Dr Graciela Husbands.  Feels this is not likely to be cardiac in nature.  With her persistent symptoms and her normal troponin does not need  further cardiac workup.  Will check her vq scan and reassess.  If negative would consider adding and antacid and follow up with PCP, GI  Celene Kras, MD 02/15/12 1515

## 2012-02-15 NOTE — ED Notes (Signed)
Pt having [facility] Pulmonary per & vent completed at this time

## 2012-02-15 NOTE — ED Notes (Signed)
Patient reports they changed her bp medication,  She started the new med on Tuesday and she has not felt well since.  Patient was on hydralazine 10 mg 2 x day and she is now on hydralazine 50 mg 2 x day.  Patient states she is feeling short of breath and feels like her head is "flickering"  She had chest pain/upper abd pain on yesterday

## 2012-02-15 NOTE — ED Notes (Signed)
Pt back from VQ scan

## 2012-02-15 NOTE — Consult Note (Signed)
CARDIOLOGY CONSULT NOTE  Patient ID: [name], MRN: [id number], DOB/AGE: [id number] 77 y.o. Admit date: 02/15/2012 Date of Consult: 02/15/2012  Primary Physician: Florentina Jenny, MD Primary Cardiologist: bc  Chief Complaint: chest pain   HPI [name] is a 78 y.o. female seen at the request of the emergency room because of an unusual chest pain syndrome.  She has a history of hypertension and remotely had a chest pain evaluation was apparently negative. When she saw Dr. Jens Som in March there was left-sided chest pain which he thought was not cardiac and no further testing was indicated.  She recently had her hydralazine dose increased. That occurred on Tuesday. By the second dosing, she began to have a collection of complaints that have been intermittent/persistent since then. Specifically, she has had a tightness in her upper chest which has been nonstop for the last 48 hours and is unrelated to exertion or position. She has had a dysphonia which is identified also by her caregiver. This has been constant. She has noted some intermittent shortness of breath with exertion and has had a positional "rattling" in her head. She usesher hands tingling  to try to describe it. It is relieved or at least goes back to baseline when she sits still.  Her past medical history is notable for hyperlipidemia and peripheral vascular disease status post iliac artery angioplasty for which she takes Plavix. She also has a long-standing history of gastroesophageal reflux   Past Medical History  Diagnosis Date  . Stroke   . Peripheral vascular disease, unspecified   . Arthritis   . Hypertension   . Hyperlipidemia   . Glaucoma       Surgical History:  Past Surgical History  Procedure Date  . Above knee leg amputation     Right  . Angioplasty / stenting iliac 03/22/09    Aortogram-  left common iliac artery  . Hemorrhoid surgery   . Tonsillectomy      Home Meds: Prior to Admission  medications   Medication Sig Start Date End Date Taking? Authorizing Provider  Acetaminophen (TYLENOL EXTRA STRENGTH PO) Take by mouth as needed.   Yes Historical Provider, MD  cholecalciferol (VITAMIN D) 1000 UNITS tablet Take 1,000 Units by mouth daily.   Yes Historical Provider, MD  clopidogrel (PLAVIX) 75 MG tablet Take 75 mg by mouth 3 (three) times a week. Mon, Wed and Friday   Yes Historical Provider, MD  furosemide (LASIX) 20 MG tablet Take 40 mg by mouth as needed. Twice weekly as needed for fluid   Yes Historical Provider, MD  hydrALAZINE (APRESOLINE) 50 MG tablet Take 50 mg by mouth 2 (two) times daily.   Yes Historical Provider, MD  lisinopril (PRINIVIL,ZESTRIL) 40 MG tablet Take 40 mg by mouth daily.   Yes Historical Provider, MD  meclizine (ANTIVERT) 25 MG tablet Take 25 mg by mouth 3 (three) times daily as needed. For dizziness   Yes Historical Provider, MD  omeprazole (PRILOSEC) 20 MG capsule Take 20 mg by mouth daily.     Yes Historical Provider, MD  Polyethyl Glycol-Propyl Glycol (SYSTANE) 0.4-0.3 % SOLN Apply to eye.     Yes Historical Provider, MD  Sennosides (SENNA LAX PO) Take 1-2 tablets by mouth daily as needed. For constipation   Yes Historical Provider, MD  simvastatin (ZOCOR) 40 MG tablet Take 40 mg by mouth every evening.   Yes Historical Provider, MD  traMADol (ULTRAM) 50 MG tablet Take 50 mg by mouth daily  as needed. For pain   Yes Historical Provider, MD    Allergies:  Allergies  Allergen Reactions  . Aspirin Anaphylaxis  . Morphine And Related Other (See Comments)    unknown    History   Social History  . Marital Status: Widowed    Spouse Name: N/A    Number of Children: 1  . Years of Education: N/A   Occupational History  .     Social History Main Topics  . Smoking status: Former Smoker    Types: Cigarettes    Quit date: 02/28/2009  . Smokeless tobacco: Not on file  . Alcohol Use: No  . Drug Use: No  . Sexually Active:    Other Topics  Concern  . Not on file   Social History Narrative  . No narrative on file     Family History  Problem Relation Age of Onset  . Heart disease Brother     unknown type     ROS:  Please see the history of present illness.     All other systems reviewed and negative.    Physical Exam:  Blood pressure 177/83, pulse 70, temperature 98.2 F (36.8 C), temperature source Oral, resp. rate 24, height 5\' 4"  (1.626 m), SpO2 99.00%. General: Well developed, well nourished  age appearing African American female in no acute distress. Head: Normocephalic, atraumatic, sclera non-icteric, no xanthomas, nares are without discharge. Lymph Nodes:  none Neck: Negative for carotid bruits. JVD not elevated. Back:without scoliosis  Lungs: Clear bilaterally to auscultation without wheezes, rales, or rhonchi. Breathing is unlabored. Heart: RRR with S1 S2. 2/6 systolic  murmur . No rubs, or gallops appreciated. Abdomen: Soft, non-tender, non-distended with normoactive bowel sounds. No hepatomegaly. No rebound/guarding. No obvious abdominal masses. Msk:  Strength and tone appear normal for age. Extremities: No clubbing or cyanosis. No  edema.  Distal pedal pulses are 2+ and equal bilaterally. Skin: Warm and Dry Neuro: Alert and oriented X 3. CN III-XII intact Grossly normal sensory and motor function . Psych:  Responds to questions appropriately with a normal affect.      Labs: Cardiac Enzymes No results found for this basename: CKTOTAL:4,CKMB:4,TROPONINI:4 in the last 72 hours CBC Lab Results  Component Value Date   WBC 5.9 02/15/2012   HGB 13.6 02/15/2012   HCT 40.5 02/15/2012   MCV 89.2 02/15/2012   PLT 262 02/15/2012   PROTIME: No results found for this basename: LABPROT:3,INR:3 in the last 72 hours Chemistry  Lab 02/15/12 1113  NA 136  K 4.2  CL 100  CO2 24  BUN 8  CREATININE 0.78  CALCIUM 9.1  PROT --  BILITOT --  ALKPHOS --  ALT --  AST --  GLUCOSE 91   Lipids No results found  for this basename: CHOL, HDL, LDLCALC, TRIG   BNP No results found for this basename: probnp   Miscellaneous Lab Results  Component Value Date   DDIMER 0.75* 02/15/2012    Radiology/Studies:  Dg Chest 2 View  02/15/2012  *RADIOLOGY REPORT*  Clinical Data: Shortness of breath and weakness.  CHEST - 2 VIEW  Comparison: 01/29/2009.  Findings: Trachea is midline.  Heart size normal.  Thoracic aorta is calcified.  There is added density in the apex of the right hemithorax, in the region of the right first costochondral junction, more prominent than on 01/29/2009.  Minimal scarring at the left lung base.  Lungs are otherwise clear.  No pleural fluid. Degenerative changes are seen in the  mid thoracic spine.  IMPRESSION: Added density in the region of the right first costochondral junction, increased in prominence from 01/29/2009.  In this patient with a smoking history, non emergent of CT chest without contrast would be helpful in further evaluation, as clinically indicated, as a pulmonary nodule cannot be excluded. These results will be called to the ordering clinician or representative by the Radiologist Assistant, and communication documented in the PACS Dashboard.  Original Report Authenticated By: Reyes Ivan, M.D.    EKG: Sinus rhythm with intervals 16/11/43 axis is 102 which is markedly right shifted from March 2013; is not clearly related to lead placement error   Assessment and Plan:  The patient presents with an atypical chest pain syndrome with an elevated d-dimer and right axis deviation. VQ scan is pending  Symptoms are quite atypical; cardiac troponins are still pending. In the event that they are negative after 48 hours of constant discomfort, I think it is likely the context of her atypical: Current symptoms to say that this is not ischemic in origin not withstanding the pretest probability being moderate given her known severe peripheral vascular disease.   Given the temporal  association with the new medication any associated dysphonia I wonder whether she might have pill esophagitis. This would not explain her usual sensation in her head however. I don't have a good explanation for that.  Recommendations based on the above I will therefore 1. Check serial troponin 2. Repeat electrocardiogram 3. If the above are negative/unchanged, I don't think there is a indication for cardiac workup at this time.  Thank you for the consultation  Sherryl Manges

## 2012-02-15 NOTE — ED Notes (Signed)
Pt not in room at this time

## 2012-02-15 NOTE — ED Notes (Signed)
PT AMBULATED TO THE RESTROOM WITH HER WALKER. TOLERATED WELL.

## 2012-02-15 NOTE — ED Provider Notes (Signed)
6:07 PM Patient sent to CDU holding for troponin and VQ scan.   Sign out received from Dr Roselyn Bering.  Patient reports she is currently feeling fine, no CP, SOB.  On exam, pt is A&Ox4, NAD, RRR, no m/r/g, CTAB though with shallow breathing/decreased air movement throughout, abd soft, NT, LLE without edema, distal pulses intact and equal bilaterally.  VQ scan shows low probability for PE, troponin is negative.  Discussed results with patient.  Plan is for d/c home with PPI. Pt to follow up with PCP.  Pt is out of low dose hydralazine that she was taking (10mg  BID) prior to being increased to 50mg  BID and symptoms starting.  I have told her I will refill this but have asked that she follow up with Dr Redmond School tomorrow morning to discuss instructions for these medications. I will also add an albuterol inhaler given COPD changes on VQ scan and shallow breathing on exam.  Return precautions given.  Patient verbalizes understanding and agrees with plan.    Results for orders placed during the hospital encounter of 02/15/12  CBC      Component Value Range   WBC 5.9  4.0 - 10.5 K/uL   RBC 4.54  3.87 - 5.11 MIL/uL   Hemoglobin 13.6  12.0 - 15.0 g/dL   HCT 96.0  45.4 - 09.8 %   MCV 89.2  78.0 - 100.0 fL   MCH 30.0  26.0 - 34.0 pg   MCHC 33.6  30.0 - 36.0 g/dL   RDW 11.9  14.7 - 82.9 %   Platelets 262  150 - 400 K/uL  BASIC METABOLIC PANEL      Component Value Range   Sodium 136  135 - 145 mEq/L   Potassium 4.2  3.5 - 5.1 mEq/L   Chloride 100  96 - 112 mEq/L   CO2 24  19 - 32 mEq/L   Glucose, Bld 91  70 - 99 mg/dL   BUN 8  6 - 23 mg/dL   Creatinine, Ser 5.62  0.50 - 1.10 mg/dL   Calcium 9.1  8.4 - 13.0 mg/dL   GFR calc non Af Amer 76 (*) >90 mL/min   GFR calc Af Amer 88 (*) >90 mL/min  D-DIMER, QUANTITATIVE      Component Value Range   D-Dimer, Quant 0.75 (*) 0.00 - 0.48 ug/mL-FEU  CARDIAC PANEL(CRET KIN+CKTOT+MB+TROPI)      Component Value Range   Total CK 126  7 - 177 U/L   CK, MB 2.8  0.3 - 4.0  ng/mL   Troponin I <0.30  <0.30 ng/mL   Relative Index 2.2  0.0 - 2.5  POCT I-STAT TROPONIN I      Component Value Range   Troponin i, poc 0.00  0.00 - 0.08 ng/mL   Comment 3            Dg Chest 2 View  02/15/2012  *RADIOLOGY REPORT*  Clinical Data: Shortness of breath and weakness.  CHEST - 2 VIEW  Comparison: 01/29/2009.  Findings: Trachea is midline.  Heart size normal.  Thoracic aorta is calcified.  There is added density in the apex of the right hemithorax, in the region of the right first costochondral junction, more prominent than on 01/29/2009.  Minimal scarring at the left lung base.  Lungs are otherwise clear.  No pleural fluid. Degenerative changes are seen in the mid thoracic spine.  IMPRESSION: Added density in the region of the right first costochondral junction, increased in  prominence from 01/29/2009.  In this patient with a smoking history, non emergent of CT chest without contrast would be helpful in further evaluation, as clinically indicated, as a pulmonary nodule cannot be excluded. These results will be called to the ordering clinician or representative by the Radiologist Assistant, and communication documented in the PACS Dashboard.  Original Report Authenticated By: Reyes Ivan, M.D.   Nm Pulmonary Per & Vent  02/15/2012  *RADIOLOGY REPORT*  Clinical Data:  Short of breath  NUCLEAR MEDICINE VENTILATION - PERFUSION LUNG SCAN  Technique:  Wash-in, equilibrium, and wash-out phase ventilation images were obtained using Xe-133 gas.  Perfusion images were obtained in multiple projections after intravenous injection of Tc- 64m MAA.  Radiopharmaceuticals:  20.0 mCi Xe-133 gas and 3.0 mCi Tc-74m MAA.  Comparison: Chest radiograph 02/15/2012  Findings:  Ventilation:  There is decreased ventilation to the right upper lobe.  Mild air trapping at the bases.  Perfusion:  There is decreased perfusion to the right upper lobe to a lesser degree than the ventilation defect.  Overall there is  a heterogeneous perfusion.  No wedge shaped peripheral perfusion defects.  IMPRESSION:  1.  Very low probably for acute pulmonary embolism. 2.  Decreased ventilation and perfusion in the right upper lobe may relate to obstructive pulmonary disease.  3.  Air trapping and heterogeneous heterogeneous perfusion relates to COPD.  4.  See recommendation on comparison chest radiograph for follow-up of potential right upper lobe pulmonary nodule.  Original Report Authenticated By: Genevive Bi, M.D.      Dillard Cannon [facility]) [place], [place] 02/15/12 [phone]

## 2012-02-15 NOTE — ED Notes (Signed)
Error in charting phlebotomy completed all blood draws

## 2012-02-16 NOTE — ED Provider Notes (Signed)
Medical screening examination/treatment/procedure(s) were conducted as a shared visit with non-physician practitioner(s) and myself.  I personally evaluated the patient during the encounter  Please see my note same visit.  Additionally, did discuss follow up testing regarding the chest xray finding with the patient.  Celene Kras, MD 02/16/12 [phone]

## 2012-02-21 ENCOUNTER — Ambulatory Visit: Payer: Medicare Other | Admitting: Neurosurgery

## 2012-02-27 ENCOUNTER — Encounter: Payer: Self-pay | Admitting: Neurosurgery

## 2012-02-28 ENCOUNTER — Ambulatory Visit (INDEPENDENT_AMBULATORY_CARE_PROVIDER_SITE_OTHER): Payer: Medicare Other | Admitting: Neurosurgery

## 2012-02-28 ENCOUNTER — Ambulatory Visit (INDEPENDENT_AMBULATORY_CARE_PROVIDER_SITE_OTHER): Payer: Medicare Other | Admitting: *Deleted

## 2012-02-28 ENCOUNTER — Encounter: Payer: Self-pay | Admitting: Neurosurgery

## 2012-02-28 VITALS — BP 162/79 | HR 75 | Resp 18 | Ht 64.0 in | Wt 160.7 lb

## 2012-02-28 DIAGNOSIS — I739 Peripheral vascular disease, unspecified: Secondary | ICD-10-CM

## 2012-02-28 DIAGNOSIS — Z48812 Encounter for surgical aftercare following surgery on the circulatory system: Secondary | ICD-10-CM

## 2012-02-28 DIAGNOSIS — T82898A Other specified complication of vascular prosthetic devices, implants and grafts, initial encounter: Secondary | ICD-10-CM

## 2012-02-28 NOTE — Progress Notes (Addendum)
[facility] [place] [facility] Office Note  CC: Annual ABIs for surveillance Referring Physician: Edilia Bo  History of Present Illness: 78 year old female patient of Dr. Edilia Bo who is status post a right AKA in [place] in the 1980s, she had a left CIA stent and PTA in August 2010. Patient states she does have some increasing pain in her left lower extremity although her ABI is unchanged from one year ago. The patient also states she has some left rib cage type pain that radiates upwards that's been worse for about one week. Patient denies any new medical diagnoses or recent surgery.  Past Medical History  Diagnosis Date  . Stroke   . Arthritis   . Hypertension   . Hyperlipidemia   . Glaucoma   . Peripheral vascular disease, unspecified     with Claudication    ROS: [x]  Positive   [ ]  Denies    General: [ ]  Weight loss, [ ]  Fever, [ ]  chills Neurologic: [ ]  Dizziness, [ ]  Blackouts, [ ]  Seizure [ ]  Stroke, [ ]  "Mini stroke", [ ]  Slurred speech, [ ]  Temporary blindness; [ ]  weakness in arms or legs, [ ]  Hoarseness Cardiac: [x ] Chest pain/pressure, [ ]  Shortness of breath at rest [ ]  Shortness of breath with exertion, [ x] Atrial fibrillation or irregular heartbeat Vascular: [x ] Pain in legs with walking, [ ]  Pain in legs at rest, [ ]  Pain in legs at night,  [ ]  Non-healing ulcer, [ ]  Blood clot in vein/DVT,   Pulmonary: [ ]  Home oxygen, [ ]  Productive cough, [ ]  Coughing up blood, [ ]  Asthma,  [ ]  Wheezing Musculoskeletal:  [ ]  Arthritis, [ ]  Low back pain, [ ]  Joint pain Hematologic: [ ]  Easy Bruising, [ ]  Anemia; [ ]  Hepatitis Gastrointestinal: [ ]  Blood in stool, [ ]  Gastroesophageal Reflux/heartburn, [ ]  Trouble swallowing Urinary: [ ]  chronic Kidney disease, [ ]  on HD - [ ]  MWF or [ ]  TTHS, [ ]  Burning with urination, [ ]  Difficulty urinating Skin: [ ]  Rashes, [ ]  Wounds Psychological: [ ]  Anxiety, [ ]  Depression   Social History History  Substance  Use Topics  . Smoking status: Former Smoker    Types: Cigarettes    Quit date: 02/28/2009  . Smokeless tobacco: Not on file  . Alcohol Use: No    Family History Family History  Problem Relation Age of Onset  . Heart disease Brother     unknown type    Allergies  Allergen Reactions  . Aspirin Anaphylaxis  . Morphine And Related Other (See Comments)    unknown    Current Outpatient Prescriptions  Medication Sig Dispense Refill  . Acetaminophen (TYLENOL EXTRA STRENGTH PO) Take 500 mg by mouth as needed.       Marland Kitchen albuterol (PROVENTIL) (2.5 MG/3ML) 0.083% nebulizer solution Take 2.5 mg by nebulization as needed.      . cholecalciferol (VITAMIN D) 1000 UNITS tablet Take 1,000 Units by mouth daily.      . clopidogrel (PLAVIX) 75 MG tablet Take 75 mg by mouth 3 (three) times a week. Mon, Wed and Friday      . furosemide (LASIX) 20 MG tablet Take 40 mg by mouth as needed. Twice weekly as needed for fluid      . hydrALAZINE (APRESOLINE) 10 MG tablet Take 1 tablet (10 mg total) by mouth 2 (two) times daily.  60 tablet  0  . meclizine (ANTIVERT) 25 MG  tablet Take 25 mg by mouth 3 (three) times daily as needed. For dizziness      . pantoprazole (PROTONIX) 20 MG tablet Take 1 tablet (20 mg total) by mouth daily.  30 tablet  0  . Polyethyl Glycol-Propyl Glycol (SYSTANE) 0.4-0.3 % SOLN Apply to eye.        . Sennosides (SENNA LAX PO) Take 1-2 tablets by mouth daily as needed. For constipation      . simvastatin (ZOCOR) 40 MG tablet Take 40 mg by mouth every evening.      . traMADol (ULTRAM) 50 MG tablet Take 50 mg by mouth daily as needed. For pain      . lisinopril (PRINIVIL,ZESTRIL) 40 MG tablet Take 40 mg by mouth daily.      Marland Kitchen omeprazole (PRILOSEC) 20 MG capsule Take 20 mg by mouth daily.          Physical Examination  Filed Vitals:   02/28/12 0947  BP: 162/79  Pulse: 75  Resp: 18    Body mass index is 27.58 kg/(m^2).  General:  WDWN in NAD Gait: Normal HEENT: WNL Eyes:  Pupils equal Pulmonary: normal non-labored breathing , without Rales, rhonchi,  wheezing Cardiac: RRR, without  Murmurs, rubs or gallops; No carotid bruits Abdomen: soft, NT, no masses Skin: no rashes, ulcers noted Vascular Exam/Pulses: Palpable femoral pulse on the left, lower stream the pulses are not palpable, no carotid bruits are heard  Extremities without ischemic changes, no Gangrene , no cellulitis; no open wounds;  Musculoskeletal: no muscle wasting or atrophy  Neurologic: A&O X 3; Appropriate Affect ; SENSATION: normal; MOTOR FUNCTION:  moving all extremities equally. Speech is fluent/normal  Non-Invasive Vascular Imaging: Left ABI today is 0.61 monophasic  ASSESSMENT/PLAN: I discussed the above findings with Dr. Edilia Bo who has requested a duplex of her left CIA stent and return to see him in the next one to 2 weeks. The patient's in agreement with this, her questions were encouraged and answered.  Lauree Chandler ANP  Clinic M.D.: Edilia Bo

## 2012-02-28 NOTE — Addendum Note (Signed)
Addended by: Sharee Pimple on: 02/28/2012 12:47 PM   Modules accepted: Orders

## 2012-03-06 ENCOUNTER — Other Ambulatory Visit: Payer: Self-pay | Admitting: Otolaryngology

## 2012-03-06 DIAGNOSIS — H9319 Tinnitus, unspecified ear: Secondary | ICD-10-CM

## 2012-03-06 DIAGNOSIS — H905 Unspecified sensorineural hearing loss: Secondary | ICD-10-CM

## 2012-03-12 ENCOUNTER — Encounter: Payer: Self-pay | Admitting: Vascular Surgery

## 2012-03-13 ENCOUNTER — Encounter: Payer: Self-pay | Admitting: Vascular Surgery

## 2012-03-13 ENCOUNTER — Ambulatory Visit (INDEPENDENT_AMBULATORY_CARE_PROVIDER_SITE_OTHER): Payer: Medicare Other | Admitting: *Deleted

## 2012-03-13 ENCOUNTER — Other Ambulatory Visit (INDEPENDENT_AMBULATORY_CARE_PROVIDER_SITE_OTHER): Payer: Medicare Other | Admitting: *Deleted

## 2012-03-13 ENCOUNTER — Ambulatory Visit (INDEPENDENT_AMBULATORY_CARE_PROVIDER_SITE_OTHER): Payer: Medicare Other | Admitting: Vascular Surgery

## 2012-03-13 VITALS — BP 181/64 | HR 84 | Resp 18 | Ht 64.0 in | Wt 161.0 lb

## 2012-03-13 DIAGNOSIS — I739 Peripheral vascular disease, unspecified: Secondary | ICD-10-CM

## 2012-03-13 DIAGNOSIS — Z48812 Encounter for surgical aftercare following surgery on the circulatory system: Secondary | ICD-10-CM

## 2012-03-13 DIAGNOSIS — I771 Stricture of artery: Secondary | ICD-10-CM

## 2012-03-13 NOTE — Progress Notes (Signed)
[facility] [place]  Patient name: [name] MRN: [id number] DOB: [birth year] Sex: female  REASON FOR VISIT: peripheral vascular disease  HPI: [name] is a 78 y.o. female has a right AKA. She is ambulatory with her prosthesis. We keep a very close eye on her left leg because of her history. She had a left common iliac artery stent placed in 2010. She came in for a routine study and ABIs. She has stable claudication of the left calf. Her pain is brought on by ambulation and relieved with rest. She's had no rest pain in the left foot. She does get cramps in her left leg. She also experiences some pain in her thigh at night which I do not think and be attributed to peripheral vascular disease.   REVIEW OF SYSTEMS: [name] ] denotes positive finding; [  ] denotes negative finding  CARDIOVASCULAR:  [ ]  chest pain   [ ]  dyspnea on exertion    CONSTITUTIONAL:  [ ]  fever   [ ]  chills  PHYSICAL EXAM: Filed Vitals:   03/13/12 0932  BP: 181/64  Pulse: 84  Resp: 18  Height: 5\' 4"  (1.626 m)  Weight: 161 lb (73.029 kg)   Body mass index is 27.64 kg/(m^2). GENERAL: The patient is a well-nourished female, in no acute distress. The vital signs are documented above. CARDIOVASCULAR: There is a regular rate and rhythm  PULMONARY: There is good air exchange bilaterally without wheezing or rales. She has a palpable left femoral pulse. I cannot palpate a right femoral pulse. She has monophasic Doppler signals in the left foot. She has no ischemic ulcers.  I independently interpreted her ABIs which showed an ABI of 63% on the right. The stent could not be visualized because of overlying bowel gas.  MEDICAL ISSUES:  Peripheral vascular disease, unspecified This patient has fairly stable ABIs on the left and stable claudication of the left leg. Unfortunately we were unable to visualize her stent with the duplex today because of overlying bowel gas. She has some pain in the thigh at rest  which I do not think can be attributed to peripheral vascular disease. At this point given that she has a good left femoral pulse and stable ABIs with a known superficial femoral artery occlusion, I do not think we need to proceed with arteriography at this time. I have ordered follow up ABIs in duplex of her iliac stent in 6 months and I'll see her back at that time. She knows to call sooner if she has problems. Fortunately she quit smoking in 2010 at the time of her original stent.   Sheniece Ruggles S [facility] [place] Beeper: [phone]

## 2012-03-13 NOTE — Assessment & Plan Note (Signed)
This patient has fairly stable ABIs on the left and stable claudication of the left leg. Unfortunately we were unable to visualize her stent with the duplex today because of overlying bowel gas. She has some pain in the thigh at rest which I do not think can be attributed to peripheral vascular disease. At this point given that she has a good left femoral pulse and stable ABIs with a known superficial femoral artery occlusion, I do not think we need to proceed with arteriography at this time. I have ordered follow up ABIs in duplex of her iliac stent in 6 months and I'll see her back at that time. She knows to call sooner if she has problems. Fortunately she quit smoking in 2010 at the time of her original stent.

## 2012-03-14 ENCOUNTER — Other Ambulatory Visit: Payer: Medicare Other

## 2012-03-14 NOTE — Addendum Note (Signed)
Addended by: Sharee Pimple on: 03/14/2012 07:50 AM   Modules accepted: Orders

## 2012-03-21 ENCOUNTER — Ambulatory Visit
Admission: RE | Admit: 2012-03-21 | Discharge: 2012-03-21 | Disposition: A | Discharge status: Home / Self Care | Payer: Medicare Other | Source: Ambulatory Visit | Attending: Otolaryngology | Admitting: Otolaryngology

## 2012-03-21 DIAGNOSIS — H9319 Tinnitus, unspecified ear: Secondary | ICD-10-CM

## 2012-03-21 DIAGNOSIS — H905 Unspecified sensorineural hearing loss: Secondary | ICD-10-CM

## 2012-03-21 MED ORDER — GADOBENATE DIMEGLUMINE 529 MG/ML IV SOLN
15.0000 mL | Freq: Once | INTRAVENOUS | Status: AC | PRN
  Administered 2012-03-21: 15 mL via INTRAVENOUS

## 2012-09-05 ENCOUNTER — Other Ambulatory Visit: Payer: Self-pay | Admitting: *Deleted

## 2012-09-05 DIAGNOSIS — I739 Peripheral vascular disease, unspecified: Secondary | ICD-10-CM

## 2012-09-05 DIAGNOSIS — Z48812 Encounter for surgical aftercare following surgery on the circulatory system: Secondary | ICD-10-CM

## 2012-09-17 ENCOUNTER — Encounter: Payer: Self-pay | Admitting: Vascular Surgery

## 2012-09-18 ENCOUNTER — Encounter (INDEPENDENT_AMBULATORY_CARE_PROVIDER_SITE_OTHER): Payer: Medicare Other | Admitting: *Deleted

## 2012-09-18 ENCOUNTER — Other Ambulatory Visit: Payer: Self-pay | Admitting: *Deleted

## 2012-09-18 ENCOUNTER — Ambulatory Visit (INDEPENDENT_AMBULATORY_CARE_PROVIDER_SITE_OTHER): Payer: Medicare Other | Admitting: Vascular Surgery

## 2012-09-18 ENCOUNTER — Encounter: Payer: Self-pay | Admitting: Vascular Surgery

## 2012-09-18 ENCOUNTER — Other Ambulatory Visit (INDEPENDENT_AMBULATORY_CARE_PROVIDER_SITE_OTHER): Payer: Medicare Other | Admitting: *Deleted

## 2012-09-18 VITALS — BP 167/82 | HR 84 | Ht 64.0 in | Wt 166.1 lb

## 2012-09-18 DIAGNOSIS — Z48812 Encounter for surgical aftercare following surgery on the circulatory system: Secondary | ICD-10-CM

## 2012-09-18 DIAGNOSIS — I739 Peripheral vascular disease, unspecified: Secondary | ICD-10-CM

## 2012-09-18 DIAGNOSIS — I771 Stricture of artery: Secondary | ICD-10-CM

## 2012-09-18 NOTE — Assessment & Plan Note (Signed)
The patient's left common iliac artery stent is widely patent with biphasic flow throughout the iliac system on the left. She does have a infrainguinal arterial occlusive disease in her ABI is down slightly to 55%, from 63% in August of 2013. Fortunately she is not a smoker. I've encouraged her to stay as active as possible. We'll have her come back in 6 months for follow up ABIs in duplex of her stent. She knows to call sooner if she has problems.

## 2012-09-18 NOTE — Progress Notes (Signed)
[facility] [place]  Patient name: [name] MRN: [id number] DOB: May 27, 1928 Sex: female  REASON FOR VISIT: follow up of left common iliac artery stent and peripheral vascular disease.  HPI: [name] is a 79 y.o. female who has undergone previous left common iliac artery stenting in August of 2010. She has known infrainguinal arterial occlusive disease. She is status post previous right above-the-knee amputation and is ambulatory with her prosthesis. He quit smoking 4 years ago. She denies claudication in her left leg and denies rest pain. She does occasionally get some pain in the arch of her left foot. She denies any problems with her right above-the-knee amputation site.   Past Medical History  Diagnosis Date  . Stroke   . Arthritis   . Hypertension   . Hyperlipidemia   . Glaucoma   . Peripheral vascular disease, unspecified     with Claudication    Family History  Problem Relation Age of Onset  . Heart disease Brother     unknown type    SOCIAL HISTORY: History  Substance Use Topics  . Smoking status: Former Smoker    Types: Cigarettes    Quit date: 02/28/2009  . Smokeless tobacco: Never Used  . Alcohol Use: No    Allergies  Allergen Reactions  . Aspirin Anaphylaxis  . Morphine And Related Other (See Comments)    unknown    Current Outpatient Prescriptions  Medication Sig Dispense Refill  . Acetaminophen (TYLENOL EXTRA STRENGTH PO) Take 500 mg by mouth as needed.       Marland Kitchen albuterol (PROVENTIL) (2.5 MG/3ML) 0.083% nebulizer solution Take 2.5 mg by nebulization as needed.      . cholecalciferol (VITAMIN D) 1000 UNITS tablet Take 1,000 Units by mouth daily.      . clopidogrel (PLAVIX) 75 MG tablet Take 75 mg by mouth 3 (three) times a week. Mon, Wed and Friday      . furosemide (LASIX) 20 MG tablet Take 40 mg by mouth as needed. Twice weekly as needed for fluid      . hydrALAZINE (APRESOLINE) 10 MG tablet Take 1 tablet (10 mg total) by  mouth 2 (two) times daily.  60 tablet  0  . lisinopril (PRINIVIL,ZESTRIL) 40 MG tablet Take 40 mg by mouth daily.      . meclizine (ANTIVERT) 25 MG tablet Take 25 mg by mouth 3 (three) times daily as needed. For dizziness      . omeprazole (PRILOSEC) 20 MG capsule Take 20 mg by mouth daily.        . pantoprazole (PROTONIX) 20 MG tablet Take 1 tablet (20 mg total) by mouth daily.  30 tablet  0  . Polyethyl Glycol-Propyl Glycol (SYSTANE) 0.4-0.3 % SOLN Apply to eye.        . Sennosides (SENNA LAX PO) Take 1-2 tablets by mouth daily as needed. For constipation      . simvastatin (ZOCOR) 40 MG tablet Take 40 mg by mouth every evening.      . traMADol (ULTRAM) 50 MG tablet Take 50 mg by mouth daily as needed. For pain       No current facility-administered medications for this visit.    REVIEW OF SYSTEMS: Arly.Keller ] denotes positive finding; [  ] denotes negative finding  CARDIOVASCULAR:  [ ]  chest pain   [ ]  chest pressure   [ ]  palpitations   [ ]  orthopnea   [ ]  dyspnea on exertion   Arly.Keller ]  claudication   [ ]  rest pain   [ ]  DVT   [ ]  phlebitis PULMONARY:   [ ]  productive cough   [ ]  asthma   [ ]  wheezing NEUROLOGIC:   [ ]  weakness  [ ]  paresthesias  [ ]  aphasia  [ ]  amaurosis  Arly.Keller ] dizziness- Occasional HEMATOLOGIC:   [ ]  bleeding problems   [ ]  clotting disorders MUSCULOSKELETAL:  [ ]  joint pain   [ ]  joint swelling [ ]  leg swelling GASTROINTESTINAL: [ ]   blood in stool  [ ]   hematemesis GENITOURINARY:  Arly.Keller ]  dysuria  [ ]   hematuria PSYCHIATRIC:  [ ]  history of major depression INTEGUMENTARY:  [ ]  rashes  [ ]  ulcers CONSTITUTIONAL:  [ ]  fever   [ ]  chills  PHYSICAL EXAM: Filed Vitals:   09/18/12 0946  BP: 167/82  Pulse: 84  Height: 5\' 4"  (1.626 m)  Weight: 166 lb 1.6 oz (75.342 kg)  SpO2: 100%   Body mass index is 28.5 kg/(m^2). GENERAL: The patient is a well-nourished female, in no acute distress. The vital signs are documented above. CARDIOVASCULAR: There is a regular rate and rhythm. I  do not detect carotid bruits. She has palpable left femoral pulse with diminished right femoral pulse. I cannot palpate a popliteal or pedal pulses on the left. PULMONARY: There is good air exchange bilaterally without wheezing or rales. ABDOMEN: Soft and non-tender with normal pitched bowel sounds.  MUSCULOSKELETAL: There are no major deformities or cyanosis. NEUROLOGIC: No focal weakness or paresthesias are detected. SKIN: There are no ulcers or rashes noted. PSYCHIATRIC: The patient has a normal affect.  DATA:  I have independently interpreted her duplex of her stent which shows biphasic signals throughout the common iliac artery stent which is patent.  I have also independently interpreted her arterial Doppler study which shows monophasic Doppler signals in the dorsalis pedis and posterior tibial positions on the left with an ABI of 55%. This is decreased slightly compared to her study in August of 2013 when her ABI was 63%.  MEDICAL ISSUES:  Peripheral vascular disease, unspecified The patient's left common iliac artery stent is widely patent with biphasic flow throughout the iliac system on the left. She does have a infrainguinal arterial occlusive disease in her ABI is down slightly to 55%, from 63% in August of 2013. Fortunately she is not a smoker. I've encouraged her to stay as active as possible. We'll have her come back in 6 months for follow up ABIs in duplex of her stent. She knows to call sooner if she has problems.   [name] S [facility] [place] Beeper: [phone]

## 2012-11-04 ENCOUNTER — Other Ambulatory Visit: Payer: Self-pay

## 2012-11-04 DIAGNOSIS — Z1231 Encounter for screening mammogram for malignant neoplasm of breast: Secondary | ICD-10-CM

## 2012-12-05 ENCOUNTER — Other Ambulatory Visit (HOSPITAL_COMMUNITY): Payer: Self-pay | Admitting: Cardiology

## 2012-12-05 DIAGNOSIS — R079 Chest pain, unspecified: Secondary | ICD-10-CM

## 2012-12-13 ENCOUNTER — Ambulatory Visit
Admission: RE | Admit: 2012-12-13 | Discharge: 2012-12-13 | Disposition: A | Discharge status: Home / Self Care | Payer: Medicare Other | Source: Ambulatory Visit

## 2012-12-13 DIAGNOSIS — Z1231 Encounter for screening mammogram for malignant neoplasm of breast: Secondary | ICD-10-CM

## 2012-12-18 ENCOUNTER — Ambulatory Visit (HOSPITAL_COMMUNITY)
Admission: RE | Admit: 2012-12-18 | Discharge: 2012-12-18 | Disposition: A | Discharge status: Home / Self Care | Payer: Medicare Other | Source: Ambulatory Visit | Attending: Cardiology | Admitting: Cardiology

## 2012-12-18 ENCOUNTER — Encounter (HOSPITAL_COMMUNITY)
Admission: RE | Admit: 2012-12-18 | Discharge: 2012-12-18 | Disposition: A | Discharge status: Home / Self Care | Payer: Medicare Other | Source: Ambulatory Visit | Attending: Cardiology | Admitting: Cardiology

## 2012-12-18 ENCOUNTER — Other Ambulatory Visit: Payer: Self-pay

## 2012-12-18 DIAGNOSIS — R079 Chest pain, unspecified: Secondary | ICD-10-CM | POA: Insufficient documentation

## 2012-12-18 DIAGNOSIS — I10 Essential (primary) hypertension: Secondary | ICD-10-CM | POA: Insufficient documentation

## 2012-12-18 DIAGNOSIS — E785 Hyperlipidemia, unspecified: Secondary | ICD-10-CM | POA: Insufficient documentation

## 2012-12-18 MED ORDER — TECHNETIUM TC 99M SESTAMIBI GENERIC - CARDIOLITE
30.0000 | Freq: Once | INTRAVENOUS | Status: AC | PRN
  Administered 2012-12-18: 30 via INTRAVENOUS

## 2012-12-18 MED ORDER — REGADENOSON 0.4 MG/5ML IV SOLN
0.4000 mg | Freq: Once | INTRAVENOUS | Status: AC
  Administered 2012-12-18: 0.4 mg via INTRAVENOUS

## 2012-12-18 MED ORDER — TECHNETIUM TC 99M SESTAMIBI GENERIC - CARDIOLITE
10.0000 | Freq: Once | INTRAVENOUS | Status: AC | PRN
  Administered 2012-12-18: 10 via INTRAVENOUS

## 2013-02-17 ENCOUNTER — Ambulatory Visit (INDEPENDENT_AMBULATORY_CARE_PROVIDER_SITE_OTHER): Payer: Medicare Other | Admitting: Obstetrics & Gynecology

## 2013-02-17 ENCOUNTER — Encounter: Payer: Self-pay | Admitting: Obstetrics & Gynecology

## 2013-02-17 ENCOUNTER — Ambulatory Visit: Payer: Self-pay | Admitting: Obstetrics & Gynecology

## 2013-02-17 VITALS — BP 147/77 | HR 80 | Temp 98.4°F | Ht 60.0 in | Wt 164.0 lb

## 2013-02-17 DIAGNOSIS — N951 Menopausal and female climacteric states: Secondary | ICD-10-CM

## 2013-02-17 DIAGNOSIS — Z01419 Encounter for gynecological examination (general) (routine) without abnormal findings: Secondary | ICD-10-CM

## 2013-02-17 NOTE — Progress Notes (Signed)
.   Subjective:     [name] is a 79 y.o. female here for a routine exam.  No current complaints.  Personal health questionnaire reviewed: no.   Gynecologic History No LMP recorded. Patient is postmenopausal. Contraception: none Last Pap: 2001. Results were: normal Last mammogram: 2014. Results were: normal  Obstetric History OB History   Grav Para Term Preterm Abortions TAB SAB Ect Mult Living                   The following portions of the patient's history were reviewed and updated as appropriate: allergies, current medications, past family history, past medical history, past social history, past surgical history and problem list.  Review of Systems Pertinent items are noted in HPI.    Objective:   General:  alert  Breasts No masses, NT, no discharge  Abdomen: soft, non-tender; bowel sounds normal; no masses,  no organomegaly   Vulva:  atrophic  Vagina: atrophic vagina  Cervix:  no lesions  Corpus: normal size, contour, position, consistency, mobility, non-tender  Adnexa:  normal adnexa      Assessment:    Healthy female exam.    Plan:    Return prn

## 2013-02-18 ENCOUNTER — Encounter: Payer: Self-pay | Admitting: Obstetrics & Gynecology

## 2013-02-18 NOTE — Patient Instructions (Signed)

## 2013-02-20 ENCOUNTER — Other Ambulatory Visit: Payer: Self-pay | Admitting: *Deleted

## 2013-02-20 DIAGNOSIS — Z48812 Encounter for surgical aftercare following surgery on the circulatory system: Secondary | ICD-10-CM

## 2013-02-20 DIAGNOSIS — I739 Peripheral vascular disease, unspecified: Secondary | ICD-10-CM

## 2013-03-05 ENCOUNTER — Other Ambulatory Visit: Payer: Self-pay | Admitting: Cardiology

## 2013-03-05 ENCOUNTER — Ambulatory Visit
Admission: RE | Admit: 2013-03-05 | Discharge: 2013-03-05 | Disposition: A | Discharge status: Home / Self Care | Payer: Medicare Other | Source: Ambulatory Visit | Attending: Cardiology | Admitting: Cardiology

## 2013-03-05 DIAGNOSIS — K048 Radicular cyst: Secondary | ICD-10-CM

## 2013-03-06 ENCOUNTER — Other Ambulatory Visit (HOSPITAL_COMMUNITY): Payer: Self-pay | Admitting: Cardiology

## 2013-03-06 DIAGNOSIS — R05 Cough: Secondary | ICD-10-CM

## 2013-03-06 DIAGNOSIS — R059 Cough, unspecified: Secondary | ICD-10-CM

## 2013-03-06 DIAGNOSIS — R0602 Shortness of breath: Secondary | ICD-10-CM

## 2013-03-11 ENCOUNTER — Ambulatory Visit (HOSPITAL_COMMUNITY)
Admission: RE | Admit: 2013-03-11 | Discharge: 2013-03-11 | Disposition: A | Discharge status: Home / Self Care | Payer: Medicare Other | Source: Ambulatory Visit | Attending: Cardiology | Admitting: Cardiology

## 2013-03-11 ENCOUNTER — Encounter (HOSPITAL_COMMUNITY): Payer: Self-pay

## 2013-03-11 DIAGNOSIS — R490 Dysphonia: Secondary | ICD-10-CM | POA: Insufficient documentation

## 2013-03-11 DIAGNOSIS — R059 Cough, unspecified: Secondary | ICD-10-CM | POA: Insufficient documentation

## 2013-03-11 DIAGNOSIS — J438 Other emphysema: Secondary | ICD-10-CM | POA: Insufficient documentation

## 2013-03-11 DIAGNOSIS — R0602 Shortness of breath: Secondary | ICD-10-CM

## 2013-03-11 DIAGNOSIS — R05 Cough: Secondary | ICD-10-CM | POA: Insufficient documentation

## 2013-03-11 DIAGNOSIS — R61 Generalized hyperhidrosis: Secondary | ICD-10-CM | POA: Insufficient documentation

## 2013-03-11 DIAGNOSIS — R222 Localized swelling, mass and lump, trunk: Secondary | ICD-10-CM | POA: Insufficient documentation

## 2013-03-11 MED ORDER — IOHEXOL 300 MG/ML  SOLN
100.0000 mL | Freq: Once | INTRAMUSCULAR | Status: AC | PRN
  Administered 2013-03-11: 100 mL via INTRAVENOUS

## 2013-03-14 ENCOUNTER — Ambulatory Visit (INDEPENDENT_AMBULATORY_CARE_PROVIDER_SITE_OTHER): Payer: Medicare Other | Admitting: Internal Medicine

## 2013-03-14 ENCOUNTER — Encounter: Payer: Self-pay | Admitting: Internal Medicine

## 2013-03-14 VITALS — BP 130/66 | HR 70 | Temp 97.3°F | Ht 60.0 in | Wt 163.8 lb

## 2013-03-14 DIAGNOSIS — R918 Other nonspecific abnormal finding of lung field: Secondary | ICD-10-CM | POA: Insufficient documentation

## 2013-03-14 DIAGNOSIS — R911 Solitary pulmonary nodule: Secondary | ICD-10-CM

## 2013-03-14 MED ORDER — OMEPRAZOLE 20 MG PO CPDR: 20.0000 mg | DELAYED_RELEASE_CAPSULE | Freq: Every day | ORAL | Status: DC

## 2013-03-14 NOTE — Patient Instructions (Addendum)
Be sure you're not taking prinivil lisinorpil or zestoril  Continue tramadol for shoulder pain and let Dr Sharyn Lull refill or change it if it's not strong enough  Increase omeprazole 20 mg Take 30- 60 min before your first and last meals of the day   GERD (REFLUX)  is an extremely common cause of respiratory symptoms, many times with no significant heartburn at all.    It can be treated with medication, but also with lifestyle changes including avoidance of late meals, excessive alcohol, smoking cessation, and avoid fatty foods, chocolate, peppermint, colas, red wine, and acidic juices such as orange juice.  NO MINT OR MENTHOL PRODUCTS SO NO COUGH DROPS  USE SUGARLESS CANDY INSTEAD (jolley ranchers or Stover's)  NO OIL BASED VITAMINS - use powdered substitutes.    Please schedule a follow up office visit in  2 weeks

## 2013-03-14 NOTE — Progress Notes (Signed)
  Subjective:    Patient ID: [name], female    DOB: Jan 31, 1928  MRN: [id number]  HPI  55 yobf quit smoking 2011 referred 03/14/13 to pulmonary clinic by Dr Sharyn Lull for eval of lung nodule   03/14/2013 1st pulmonary eval cc sob x 5 weeks with anything but sitting still or lying down assoc with dry cough ? On ACei,  indolent progressive > CT chest c/w Rapical mass and copd, no PE or effusion. Also R > L bilateral should pain and stiffness over about the same period of time.   Some better with saba bur very confused with meds/ inhalers/ nebs which ones she's actually taking and whether or not she's still on ACEi  No obvious daytime variabilty or assoc chronic cough or cp or chest tightness, subjective wheeze overt sinus or hb symptoms. No unusual exp hx or h/o childhood pna/ asthma or knowledge of premature birth.   Sleeping ok without nocturnal  or early am exacerbation  of respiratory  c/o's or need for noct saba. Also denies any obvious fluctuation of symptoms with weather or environmental changes or other aggravating or alleviating factors except as outlined above   Current Medications, Allergies, Past Medical History, Past Surgical History, Family History, and Social History were reviewed in [employer] record.             Review of Systems  Constitutional: Negative for fever, chills and unexpected weight change.  HENT: Negative for ear pain, nosebleeds, congestion, sore throat, rhinorrhea, sneezing, trouble swallowing, dental problem, voice change, postnasal drip and sinus pressure.   Eyes: Negative for visual disturbance.  Respiratory: Positive for cough and shortness of breath. Negative for choking.   Cardiovascular: Negative for chest pain and leg swelling.  Gastrointestinal: Negative for vomiting, abdominal pain and diarrhea.  Genitourinary: Negative for difficulty urinating.  Musculoskeletal: Positive for arthralgias.  Skin: Negative for rash.   Neurological: Positive for headaches. Negative for tremors and syncope.  Hematological: Does not bruise/bleed easily.       Objective:   Physical Exam  amb bf needed 1 person assist and sob x 5 ft to exam table very hoarse   Wt Readings from Last 3 Encounters:  03/14/13 163 lb 12.8 oz (74.299 kg)  02/17/13 164 lb (74.39 kg)  09/18/12 166 lb 1.6 oz (75.342 kg)    HEENT mild turbinate edema.  Oropharynx no thrush or excess pnd or cobblestoning.  No JVD or cervical adenopathy. Mild accessory muscle hypertrophy. Trachea midline, nl thryroid. Chest was hyperinflated by percussion with diminished breath sounds and moderate increased exp time without wheeze. Hoover sign positive at mid inspiration. Regular rate and rhythm without murmur gallop or rub or increase P2 or edema.  Abd: no hsm, nl excursion. Ext warm without cyanosis or clubbing.     CT chest 03/11/13 1. 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 Tissue sampling recommended.  2. No evidence of metastatic disease.  3. Mild centrilobular emphysema.     Assessment & Plan:

## 2013-03-16 NOTE — Assessment & Plan Note (Addendum)
Although there are clearly abnormalities on CT scan, they should probably be considered "microscopic" in the big picture.     In the setting of obvious "macroscopic" health issues,  I am very reluctatnt to embark on an invasive w/u at this point but will arrange consevative  follow up and in the meantime see what we can do to address the patient's subjective concerns, ie sob x 5 feet, as can't consider any form of dx or therapuetic intervention at this point   First need to be sure not on acei and rx gerd with meds/diet and then return in 2 weeks to see if any better and get pfts to see if she's a candidate for fob bx as clearly no a candidate for excisional bx and poor fna candidate with high risk ptx  Discussed in detail all the  indications, usual  risks and alternatives  relative to the benefits with patient who agrees to proceed with rx as planned

## 2013-03-18 ENCOUNTER — Encounter: Payer: Self-pay | Admitting: Family

## 2013-03-19 ENCOUNTER — Ambulatory Visit (INDEPENDENT_AMBULATORY_CARE_PROVIDER_SITE_OTHER): Payer: Medicare Other | Admitting: Family

## 2013-03-19 ENCOUNTER — Encounter (INDEPENDENT_AMBULATORY_CARE_PROVIDER_SITE_OTHER): Payer: Medicare Other | Admitting: *Deleted

## 2013-03-19 ENCOUNTER — Encounter: Payer: Self-pay | Admitting: Family

## 2013-03-19 ENCOUNTER — Other Ambulatory Visit (INDEPENDENT_AMBULATORY_CARE_PROVIDER_SITE_OTHER): Payer: Medicare Other | Admitting: *Deleted

## 2013-03-19 VITALS — BP 174/79 | HR 78 | Resp 20 | Ht 60.0 in | Wt 163.0 lb

## 2013-03-19 DIAGNOSIS — I739 Peripheral vascular disease, unspecified: Secondary | ICD-10-CM

## 2013-03-19 DIAGNOSIS — Z48812 Encounter for surgical aftercare following surgery on the circulatory system: Secondary | ICD-10-CM

## 2013-03-19 DIAGNOSIS — I771 Stricture of artery: Secondary | ICD-10-CM

## 2013-03-19 DIAGNOSIS — M25569 Pain in unspecified knee: Secondary | ICD-10-CM

## 2013-03-19 DIAGNOSIS — M25562 Pain in left knee: Secondary | ICD-10-CM

## 2013-03-19 NOTE — Progress Notes (Addendum)
[facility] [place] HISTORY AND PHYSICAL -PAD  History of Present Illness  [name] is a 79 y.o. female patient who has undergone previous left common iliac artery stenting in August of 2010. She has known infrainguinal arterial occlusive disease. She is status post previous right above-the-knee amputation and is ambulatory with her prosthesis. She quit smoking 4 1/2 years ago. Her chief complaint is pain in both shoulders for which she is undergoing evaluation. She does have some pain in the muscle of her left leg when walking what seems to be short distance with her walker. She also complains of occasional right stump pain.  She denies non healing ulcers on lower extremities.  Pt Diabetic: No Pt smoker: No  Pt meds include: Statin :Yes Betablocker: No ASA: No Other anticoagulants/antiplatelets:Plavix  Past Medical History  Diagnosis Date  . Stroke   . Arthritis   . Hypertension   . Hyperlipidemia   . Glaucoma   . Peripheral vascular disease, unspecified     with Claudication    Social History History  Substance Use Topics  . Smoking status: Former Smoker -- 0.50 packs/day for 60 years    Types: Cigarettes    Quit date: 07/31/2009  . Smokeless tobacco: Never Used  . Alcohol Use: No    Family History Family History  Problem Relation Age of Onset  . Stroke Brother   . Stomach cancer Mother   . Asthma Sister   . Colon cancer Brother     Past Surgical History  Procedure Laterality Date  . Above knee leg amputation      Right  . Angioplasty / stenting iliac  03/22/09    Aortogram-  left common iliac artery  . Hemorrhoid surgery    . Tonsillectomy    . Colonoscopy  Feb. 2013    Allergies  Allergen Reactions  . Aspirin Anaphylaxis  . Morphine And Related Other (See Comments)    unknown    Current Outpatient Prescriptions  Medication Sig Dispense Refill  . Acetaminophen (TYLENOL EXTRA STRENGTH PO) Take 500 mg by mouth as needed.        Marland Kitchen albuterol (PROAIR HFA) 108 (90 BASE) MCG/ACT inhaler Inhale 2 puffs into the lungs every 6 (six) hours as needed for wheezing.      Marland Kitchen albuterol (PROVENTIL) (2.5 MG/3ML) 0.083% nebulizer solution Take 2.5 mg by nebulization as needed.      . cholecalciferol (VITAMIN D) 1000 UNITS tablet Take 1,000 Units by mouth daily.      . clopidogrel (PLAVIX) 75 MG tablet Take 75 mg by mouth 3 (three) times a week. Mon, Wed and Friday      . furosemide (LASIX) 20 MG tablet 2 tablets twice per wk      . hydrALAZINE (APRESOLINE) 25 MG tablet Take 25 mg by mouth 3 (three) times daily.      . meclizine (ANTIVERT) 25 MG tablet Take 25 mg by mouth 3 (three) times daily as needed. For dizziness      . omeprazole (PRILOSEC) 20 MG capsule Take 1 capsule (20 mg total) by mouth daily.  60 capsule  2  . pravastatin (PRAVACHOL) 20 MG tablet Take 20 mg by mouth daily.      . RESTASIS 0.05 % ophthalmic emulsion Place 1 drop into both eyes every 12 (twelve) hours.       . Sennosides (SENNA LAX PO) Take 1-2 tablets by mouth daily as needed. For constipation      .  traMADol (ULTRAM) 50 MG tablet Take 50 mg by mouth daily as needed. For pain       No current facility-administered medications for this visit.    ROS: [x]  Positive   [ ]  Denies  General:[ ]  Weight loss,  [ ]  Weight gain, [ ]  Fever, [ ]  chills Neurologic: [ ]  Dizziness, [ ]  Blackouts, [ ]  Seizure [ ]  Stroke, Arly.Keller ] "Mini stroke" 3 years ago, [ ]  Slurred speech, [ ]  Temporary blindness;  [ ] weakness, [ ]  Hoarseness Cardiac: [ ]  Chest pain/pressure, [ ]  Shortness of breath at rest [ ]  Shortness of breath with exertion,  [ ]   Atrial fibrillation or irregular heartbeat Vascular:[X ] Pain in legs with walking, [ ]  Pain in legs at rest ,[ ]  Pain in legs at night,  [ ]   Non-healing ulcer, Arly.Keller ] Blood clot in vein/DVT [history],   Pulmonary: [ ]  Home oxygen, [ ]   Productive cough, [ ]  Coughing up blood,  [ ]  Asthma,  Arly.Keller ] Wheezing Musculoskeletal:  [ ]  Arthritis,  [ ]  Low back pain,  Arly.Keller ] Joint pain Hematologic:[x ] Easy Bruising, [ ]  Anemia; [ ]  Hepatitis Gastrointestinal: [ ]  Blood in stool,  [ ]  Gastroesophageal Reflux, [ ]  Trouble swallowing Urinary: [ ]  chronic Kidney disease, [ ]  on HD, [ ]  Burning with urination, [ ]  Frequent urination, [ ]  Difficulty urinating;  Skin: [ ]  Rashes, [ ]  Wounds     Physical Examination  Filed Vitals:   03/19/13 1140  BP: 174/79  Pulse: 78  Resp: 20    General: A&O x 3, WDWN,  Gait: with walker Eyes: PERRLA, Pulmonary: CTAB, without wheezes , rales or rhonchi Cardiac: regular Rythm , without murmur          Carotid Bruits Left Right   Negative Negative                             VASCULAR EXAM: Extremities: left leg without ischemic changes, right AKA with prosthesis in place. No open wounds.                                                                                                          LE Pulses LEFT RIGHT       FEMORAL  not palpable  not palpable        POPLITEAL  not palpable   AKA       POSTERIOR TIBIAL  palpable   AKA        DORSALIS PEDIS      ANTERIOR TIBIAL NOT palpable  AKA    Abdomen: soft, NT, no masses Skin: no rashes, ulcers noted Musculoskeletal: no muscle wasting or atrophy  Neurologic: A&O X 3; Appropriate Affect ; SENSATION: normal; MOTOR FUNCTION:  moving all extremities equally. Speech is fluent/normal. Hard of hearing.  Non-Invasive Vascular Imaging: DATE: 03/19/2013  LEFT 0.66, Waveforms: MONOPHASIC DUPLEX SCAN OF stent: suggests greater than 50% stenosis left proximal common iliac artery; 6 months ago demonstrated  less than 50% stenosis.   ASSESSMENT: DARRELYN [name] is a 79 y.o. female who presents with: greater than 50% stenosis left PCIA, seems slightly worse than 6 months ago.  PLAN: After discussion with Dr. Edilia Bo and based on the patient's vascular studies and examination, pt will return to clinic in 6 months for repeat ABI left leg and aorto-iliac  Duplex for surveillance of stent and stenosis. Charisse March, RN, MSN, FNP-C Office Phone: [phone]  Clinic MD: Edilia Bo  03/19/2013 12:55 PM

## 2013-03-28 ENCOUNTER — Encounter: Payer: Self-pay | Admitting: Internal Medicine

## 2013-03-28 ENCOUNTER — Ambulatory Visit (INDEPENDENT_AMBULATORY_CARE_PROVIDER_SITE_OTHER): Payer: Medicare Other | Admitting: Internal Medicine

## 2013-03-28 ENCOUNTER — Ambulatory Visit: Payer: Medicare Other | Admitting: Internal Medicine

## 2013-03-28 VITALS — BP 130/64 | HR 66 | Temp 97.8°F | Ht 60.0 in | Wt 163.2 lb

## 2013-03-28 DIAGNOSIS — R911 Solitary pulmonary nodule: Secondary | ICD-10-CM

## 2013-03-28 NOTE — Progress Notes (Signed)
  Subjective:    Patient ID: [name], female    DOB: 19-Aug-1927  MRN: [id number]    Brief patient profile:  47 yobf quit smoking 2011 referred 03/14/13 to pulmonary clinic by Dr Sharyn Lull for eval of lung nodule  HPI 03/14/2013 1st pulmonary eval cc sob x 5 weeks with anything but sitting still or lying down assoc with dry cough ? On ACei,  indolent progressive > CT chest c/w Rapical mass and copd, no PE or effusion. Also R > L bilateral should pain and stiffness over about the same period of time.   Some better with saba bur very confused with meds/ inhalers/ nebs which ones she's actually taking and whether or not she's still on ACEi rec Be sure you're not taking prinivil lisinorpil or zestoril Continue tramadol for shoulder pain and let Dr Sharyn Lull refill or change it if it's not strong enough Increase omeprazole 20 mg Take 30- 60 min before your first and last meals of the day  GERD diet   03/28/2013 f/u ov/[name] f/u re copd and need for lung bx  Chief Complaint  Patient presents with  . Follow-up    Pt states that her breathing is much improved since her last visit. No new co's today.     No obvious daytime variabilty or assoc chronic cough or cp or chest tightness, subjective wheeze overt sinus or hb symptoms. No unusual exp hx or h/o childhood pna/ asthma or knowledge of premature birth.   Sleeping ok without nocturnal  or early am exacerbation  of respiratory  c/o's or need for noct saba. Also denies any obvious fluctuation of symptoms with weather or environmental changes or other aggravating or alleviating factors except as outlined above   Current Medications, Allergies, Past Medical History, Past Surgical History, Family History, and Social History were reviewed in [employer] record.                   Objective:   Physical Exam  amb bf needed 1 person assist and sob x 5 ft to exam table very hoarse   03/30/2013       163  Wt Readings from  Last 3 Encounters:  03/14/13 163 lb 12.8 oz (74.299 kg)  02/17/13 164 lb (74.39 kg)  09/18/12 166 lb 1.6 oz (75.342 kg)    HEENT mild turbinate edema.  Oropharynx no thrush or excess pnd or cobblestoning.  No JVD or cervical adenopathy. Mild accessory muscle hypertrophy. Trachea midline, nl thryroid. Chest was hyperinflated by percussion with diminished breath sounds and moderate increased exp time without wheeze. Hoover sign positive at mid inspiration. Regular rate and rhythm without murmur gallop or rub or increase P2 or edema.  Abd: no hsm, nl excursion. Ext warm without cyanosis or clubbing.     CT chest 03/11/13 1. 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 Tissue sampling recommended.  2. No evidence of metastatic disease.  3. Mild centrilobular emphysema.     Assessment & Plan:

## 2013-03-28 NOTE — Patient Instructions (Addendum)
Stop plavix on Monday the 8th and come to outpatient registration 9/11 at 715 am [facility] outpatient registration and nothing to eat after midnight on Wednesday but ok to take pills with small amt of water.

## 2013-03-30 NOTE — Assessment & Plan Note (Signed)
CT chest 03/11/13  1. 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 Tissue sampling recommended.  2. No evidence of metastatic disease.  3. Mild centrilobular emphysema.  I had an extended discussion with the patient today lasting 15 to 20 minutes of a 25 minute visit on the following issues:  This is almost certainly a tumor,  Not scar tissue, and since her berthing is so much better I feel there is a potential benefit at relatively low risk of diagnosing the cause with fob and then considering RT sooner rather than later (when she will likely live long enough to experience symptoms from this or symptoms from metastatic dz)  Discussed in detail all the  indications, usual  risks and alternatives  relative to the benefits with patient who agrees to proceed with bronchoscopy with biopsy.

## 2013-04-10 ENCOUNTER — Ambulatory Visit (HOSPITAL_COMMUNITY)
Admission: RE | Admit: 2013-04-10 | Discharge: 2013-04-10 | Disposition: A | Discharge status: Home / Self Care | Payer: Medicare Other | Source: Ambulatory Visit | Attending: Internal Medicine | Admitting: Internal Medicine

## 2013-04-10 ENCOUNTER — Ambulatory Visit (HOSPITAL_COMMUNITY): Payer: Medicare Other

## 2013-04-10 ENCOUNTER — Encounter (HOSPITAL_COMMUNITY)
Admission: RE | Disposition: A | Discharge status: Home / Self Care | Payer: Self-pay | Source: Ambulatory Visit | Attending: Internal Medicine

## 2013-04-10 ENCOUNTER — Encounter (HOSPITAL_COMMUNITY): Payer: Self-pay | Admitting: Respiratory Therapy

## 2013-04-10 ENCOUNTER — Encounter (HOSPITAL_COMMUNITY)
Admission: RE | Disposition: A | Discharge status: Home / Self Care | Payer: Medicare Other | Source: Ambulatory Visit | Attending: Internal Medicine

## 2013-04-10 ENCOUNTER — Inpatient Hospital Stay (HOSPITAL_COMMUNITY)
Admission: RE | Admit: 2013-04-10 | Discharge: 2013-04-11 | DRG: 920 | Disposition: A | Discharge status: Home / Self Care | Payer: Medicare Other | Source: Ambulatory Visit | Attending: Internal Medicine | Admitting: Internal Medicine

## 2013-04-10 DIAGNOSIS — R222 Localized swelling, mass and lump, trunk: Secondary | ICD-10-CM | POA: Diagnosis present

## 2013-04-10 DIAGNOSIS — R0789 Other chest pain: Secondary | ICD-10-CM | POA: Diagnosis present

## 2013-04-10 DIAGNOSIS — R061 Stridor: Secondary | ICD-10-CM | POA: Diagnosis present

## 2013-04-10 DIAGNOSIS — E785 Hyperlipidemia, unspecified: Secondary | ICD-10-CM

## 2013-04-10 DIAGNOSIS — Z8673 Personal history of transient ischemic attack (TIA), and cerebral infarction without residual deficits: Secondary | ICD-10-CM

## 2013-04-10 DIAGNOSIS — Y849 Medical procedure, unspecified as the cause of abnormal reaction of the patient, or of later complication, without mention of misadventure at the time of the procedure: Secondary | ICD-10-CM | POA: Diagnosis present

## 2013-04-10 DIAGNOSIS — J438 Other emphysema: Secondary | ICD-10-CM | POA: Diagnosis present

## 2013-04-10 DIAGNOSIS — R918 Other nonspecific abnormal finding of lung field: Secondary | ICD-10-CM

## 2013-04-10 DIAGNOSIS — IMO0002 Reserved for concepts with insufficient information to code with codable children: Principal | ICD-10-CM | POA: Diagnosis present

## 2013-04-10 DIAGNOSIS — I739 Peripheral vascular disease, unspecified: Secondary | ICD-10-CM

## 2013-04-10 DIAGNOSIS — M25561 Pain in right knee: Secondary | ICD-10-CM

## 2013-04-10 DIAGNOSIS — S78119A Complete traumatic amputation at level between unspecified hip and knee, initial encounter: Secondary | ICD-10-CM

## 2013-04-10 DIAGNOSIS — H409 Unspecified glaucoma: Secondary | ICD-10-CM | POA: Diagnosis present

## 2013-04-10 DIAGNOSIS — I10 Essential (primary) hypertension: Secondary | ICD-10-CM

## 2013-04-10 DIAGNOSIS — Z87891 Personal history of nicotine dependence: Secondary | ICD-10-CM

## 2013-04-10 DIAGNOSIS — I771 Stricture of artery: Secondary | ICD-10-CM

## 2013-04-10 DIAGNOSIS — K219 Gastro-esophageal reflux disease without esophagitis: Secondary | ICD-10-CM | POA: Diagnosis present

## 2013-04-10 DIAGNOSIS — Y9210 Unspecified residential institution as the place of occurrence of the external cause: Secondary | ICD-10-CM | POA: Diagnosis present

## 2013-04-10 DIAGNOSIS — R911 Solitary pulmonary nodule: Secondary | ICD-10-CM

## 2013-04-10 DIAGNOSIS — R042 Hemoptysis: Secondary | ICD-10-CM | POA: Diagnosis present

## 2013-04-10 HISTORY — PX: VIDEO BRONCHOSCOPY: SHX5072

## 2013-04-10 LAB — CBC WITH DIFFERENTIAL/PLATELET
Basophils Absolute: 0 10*3/uL (ref 0.0–0.1)
Basophils Relative: 0 % (ref 0–1)
Eosinophils Absolute: 0 10*3/uL (ref 0.0–0.7)
Eosinophils Relative: 0 % (ref 0–5)
HCT: 40.9 % (ref 36.0–46.0)
Hemoglobin: 13.3 g/dL (ref 12.0–15.0)
Lymphocytes Relative: 26 % (ref 12–46)
Lymphs Abs: 1.8 10*3/uL (ref 0.7–4.0)
MCH: 29.3 pg (ref 26.0–34.0)
MCHC: 32.5 g/dL (ref 30.0–36.0)
MCV: 90.1 fL (ref 78.0–100.0)
Monocytes Absolute: 0.4 10*3/uL (ref 0.1–1.0)
Monocytes Relative: 5 % (ref 3–12)
Neutro Abs: 4.8 10*3/uL (ref 1.7–7.7)
Neutrophils Relative %: 69 % (ref 43–77)
Platelets: 238 10*3/uL (ref 150–400)
RBC: 4.54 MIL/uL (ref 3.87–5.11)
RDW: 13.5 % (ref 11.5–15.5)
WBC: 7 10*3/uL (ref 4.0–10.5)

## 2013-04-10 LAB — PHOSPHORUS: Phosphorus: 3.7 mg/dL (ref 2.3–4.6)

## 2013-04-10 LAB — COMPREHENSIVE METABOLIC PANEL
ALT: 10 U/L (ref 0–35)
AST: 15 U/L (ref 0–37)
Albumin: 3.3 g/dL — ABNORMAL LOW (ref 3.5–5.2)
Alkaline Phosphatase: 54 U/L (ref 39–117)
BUN: 13 mg/dL (ref 6–23)
CO2: 25 mEq/L (ref 19–32)
Calcium: 8.9 mg/dL (ref 8.4–10.5)
Chloride: 105 mEq/L (ref 96–112)
Creatinine, Ser: 0.86 mg/dL (ref 0.50–1.10)
GFR calc Af Amer: 70 mL/min — ABNORMAL LOW (ref >90)
GFR calc non Af Amer: 61 mL/min — ABNORMAL LOW (ref >90)
Glucose, Bld: 108 mg/dL — ABNORMAL HIGH (ref 70–99)
Potassium: 4.1 mEq/L (ref 3.5–5.1)
Sodium: 137 mEq/L (ref 135–145)
Total Bilirubin: 0.2 mg/dL — ABNORMAL LOW (ref 0.3–1.2)
Total Protein: 6.6 g/dL (ref 6.0–8.3)

## 2013-04-10 LAB — APTT: aPTT: 28 seconds (ref 24–37)

## 2013-04-10 LAB — TROPONIN I
Troponin I: <0.3 ng/mL (ref <0.30)
Troponin I: <0.3 ng/mL (ref <0.30)
Troponin I: <0.3 ng/mL (ref <0.30)

## 2013-04-10 LAB — PROTIME-INR
INR: 1.14 (ref 0.00–1.49)
Prothrombin Time: 14.4 seconds (ref 11.6–15.2)

## 2013-04-10 LAB — PRO B NATRIURETIC PEPTIDE: Pro B Natriuretic peptide (BNP): 195.9 pg/mL (ref 0–450)

## 2013-04-10 LAB — MAGNESIUM: Magnesium: 2.1 mg/dL (ref 1.5–2.5)

## 2013-04-10 SURGERY — BRONCHOSCOPY, FLEXIBLE
Anesthesia: Moderate Sedation | Laterality: Bilateral

## 2013-04-10 SURGERY — BRONCHOSCOPY, WITH FLUOROSCOPY
Anesthesia: Moderate Sedation | Laterality: Bilateral

## 2013-04-10 MED ORDER — LIDOCAINE HCL 1 % IJ SOLN
INTRAMUSCULAR | Status: DC | PRN
  Administered 2013-04-10: 6 mL via RESPIRATORY_TRACT

## 2013-04-10 MED ORDER — MEPERIDINE HCL 25 MG/ML IJ SOLN
50.0000 mg | Freq: Once | INTRAMUSCULAR | Status: AC
  Administered 2013-04-10: 50 mg via INTRAVENOUS

## 2013-04-10 MED ORDER — SIMVASTATIN 10 MG PO TABS
10.0000 mg | ORAL_TABLET | Freq: Every day | ORAL | Status: DC
  Administered 2013-04-10: 10 mg via ORAL
  Filled 2013-04-10 (×2): qty 1

## 2013-04-10 MED ORDER — IPRATROPIUM BROMIDE 0.02 % IN SOLN
0.5000 mg | Freq: Four times a day (QID) | RESPIRATORY_TRACT | Status: DC
  Administered 2013-04-10 – 2013-04-11 (×4): 0.5 mg via RESPIRATORY_TRACT
  Filled 2013-04-10 (×5): qty 2.5

## 2013-04-10 MED ORDER — HYDROCODONE-ACETAMINOPHEN 5-325 MG PO TABS: 1.0000 | ORAL_TABLET | ORAL | Status: DC | PRN

## 2013-04-10 MED ORDER — CYCLOSPORINE 0.05 % OP EMUL
1.0000 [drp] | Freq: Two times a day (BID) | OPHTHALMIC | Status: DC
  Administered 2013-04-10 – 2013-04-11 (×2): 1 [drp] via OPHTHALMIC
  Filled 2013-04-10 (×4): qty 1

## 2013-04-10 MED ORDER — HYDROCOD POLST-CHLORPHEN POLST 10-8 MG/5ML PO LQCR
5.0000 mL | Freq: Two times a day (BID) | ORAL | Status: DC | PRN
  Administered 2013-04-11: 5 mL via ORAL
  Filled 2013-04-10: qty 5

## 2013-04-10 MED ORDER — PHENYLEPHRINE HCL 0.25 % NA SOLN
NASAL | Status: DC | PRN
  Administered 2013-04-10: 1 via NASAL

## 2013-04-10 MED ORDER — SODIUM CHLORIDE 0.9 % IV SOLN
INTRAVENOUS | Status: DC
  Administered 2013-04-10: 08:00:00 via INTRAVENOUS

## 2013-04-10 MED ORDER — MIDAZOLAM HCL 10 MG/2ML IJ SOLN
INTRAMUSCULAR | Status: DC | PRN
  Administered 2013-04-10: 2.5 mg via INTRAVENOUS

## 2013-04-10 MED ORDER — MEPERIDINE HCL 100 MG/ML IJ SOLN
INTRAMUSCULAR | Status: AC
  Filled 2013-04-10: qty 2

## 2013-04-10 MED ORDER — MEPERIDINE HCL 25 MG/ML IJ SOLN
INTRAMUSCULAR | Status: DC | PRN
  Administered 2013-04-10: 25 mg via INTRAVENOUS

## 2013-04-10 MED ORDER — LIDOCAINE HCL 2 % EX GEL
CUTANEOUS | Status: DC | PRN
  Administered 2013-04-10: 1

## 2013-04-10 MED ORDER — ALBUTEROL SULFATE (5 MG/ML) 0.5% IN NEBU
2.5000 mg | INHALATION_SOLUTION | Freq: Four times a day (QID) | RESPIRATORY_TRACT | Status: DC
  Administered 2013-04-10 – 2013-04-11 (×4): 2.5 mg via RESPIRATORY_TRACT
  Filled 2013-04-10 (×5): qty 0.5

## 2013-04-10 MED ORDER — MEPERIDINE HCL 100 MG/ML IJ SOLN
INTRAMUSCULAR | Status: AC
  Filled 2013-04-10: qty 1

## 2013-04-10 MED ORDER — EPINEPHRINE HCL 0.1 MG/ML IJ SOSY
PREFILLED_SYRINGE | INTRAMUSCULAR | Status: DC | PRN
  Administered 2013-04-10: 1 via INTRAVENOUS

## 2013-04-10 MED ORDER — INFLUENZA VAC SPLIT QUAD 0.5 ML IM SUSP
0.5000 mL | INTRAMUSCULAR | Status: AC
  Administered 2013-04-11: 0.5 mL via INTRAMUSCULAR
  Filled 2013-04-10 (×2): qty 0.5

## 2013-04-10 MED ORDER — VITAMIN D3 25 MCG (1000 UNIT) PO TABS
1000.0000 [IU] | ORAL_TABLET | Freq: Every day | ORAL | Status: DC
  Administered 2013-04-10 – 2013-04-11 (×2): 1000 [IU] via ORAL
  Filled 2013-04-10 (×2): qty 1

## 2013-04-10 MED ORDER — SODIUM CHLORIDE 0.9 % IV SOLN
INTRAVENOUS | Status: DC
  Administered 2013-04-10 – 2013-04-11 (×2): via INTRAVENOUS

## 2013-04-10 MED ORDER — MIDAZOLAM HCL 10 MG/2ML IJ SOLN
INTRAMUSCULAR | Status: AC
  Filled 2013-04-10: qty 4

## 2013-04-10 MED ORDER — PANTOPRAZOLE SODIUM 40 MG PO TBEC
40.0000 mg | DELAYED_RELEASE_TABLET | Freq: Every day | ORAL | Status: DC
  Administered 2013-04-10 – 2013-04-11 (×2): 40 mg via ORAL
  Filled 2013-04-10 (×2): qty 1

## 2013-04-10 MED ORDER — TRAMADOL HCL 50 MG PO TABS
50.0000 mg | ORAL_TABLET | Freq: Four times a day (QID) | ORAL | Status: DC
  Administered 2013-04-10 – 2013-04-11 (×5): 50 mg via ORAL
  Filled 2013-04-10 (×5): qty 1

## 2013-04-10 NOTE — Discharge Summary (Signed)
Physician Discharge Summary     Patient ID: [name] MRN: [id number] DOB/AGE: 1928-07-16 79 y.o.  Admit date: 04/10/2013 Discharge date: 04/11/2013  Discharge Diagnoses:  Right upper lobe (RUL) Lung mass Chest pain Hemoptysis RUL pulmonary infiltrate COPD/emphysema HTN H/o CVA and PVD GERD Detailed Hospital Course:   79 year old female f/b Dr Sherene Sires in outpt setting for RUL lung mass. She underwent RUL washings and TbBx early in the am of 9/11. Post-procedure had marked RUL CP, dyspnea and wheezing, followed by hemoptysis. PCXR suggesting new RUL hemorrhage. Will be admitted for CP and hemoptysis. Was admitted to the medical/tele ward after bronch.. Troponin I and BNP was negative. We treated her with cough suppression and focused on observation. Her cough improved, as did her chest discomfort. She was cleared for discharge as of 9/12. She will return to home on cough suppression w/ Hycodan Will be clear to resume plavix as of 9/15 and follow up with [place] on 9/19 at 1030.      Discharge Plan by diagnoses  RUL Lung mass w/ new Chest pain/ hemoptysis and RUL infiltrate s/p bronch.  Think that this represents mild post bx intraparenchymal hemorrhage.  Plan  Resume plavix on 9/15 assuming no more hemoptysis Home with cough suppression Hycodan  F/u cytology / biopsy results  F/u with [facility] on 9/19  COPD/emphysema  Plan:  Cont BDs  Cont FIO2   H/O HTN   Plan:  Cont  oral antihypertensives.   Prior CVA and PVD.  Plan:  Resume plavix per above  GERD  Plan:  Cont PPI   glaucoma  Plan:  Cont eye gtts.   Significant Hospital tests/ studies/ interventions and procedures   SIGNIFICANT EVENTS / STUDIES:  RUL washings and TbBx 9/11  Cytology 9/11>>> neg Pathology 9/11>>>   CULTURES:  Sputum 9/11>>>  Discharge Exam: BP 142/47  Pulse 74  Temp(Src) 98.1 F (36.7 C) (Oral)  Resp 16  Ht 5' (1.524 m)  Wt 69.945 kg (154 lb 3.2 oz)  BMI 30.12 kg/m2  SpO2  100%  General: 79 year old female, not in acute distress.  Neuro: Awake, alert, no focal def  HEENT: [place], no JVD  Cardiovascular: rrr  Lungs: Crackles/ on rul  Abdomen: Soft. nt + bowel sounds  Musculoskeletal: RLE prosthesis    Labs at discharge Lab Results  Component Value Date   CREATININE 0.86 04/10/2013   BUN 13 04/10/2013   NA 137 04/10/2013   K 4.1 04/10/2013   CL 105 04/10/2013   CO2 25 04/10/2013   Lab Results  Component Value Date   WBC 7.0 04/10/2013   HGB 13.3 04/10/2013   HCT 40.9 04/10/2013   MCV 90.1 04/10/2013   PLT 238 04/10/2013   Lab Results  Component Value Date   ALT 10 04/10/2013   AST 15 04/10/2013   ALKPHOS 54 04/10/2013   BILITOT 0.2* 04/10/2013   Lab Results  Component Value Date   INR 1.14 04/10/2013   INR 1.0 01/29/2009    Current radiology studies Dg Chest Port 1 View  04/11/2013   *RADIOLOGY REPORT*  Clinical Data: Chest pain  PORTABLE CHEST - 1 VIEW  Comparison: 04/10/2013  Findings: Right upper lobe mass lesion unchanged.  Surrounding right upper lobe infiltrate shows slight interval improvement and may represent post biopsy hemorrhage or pneumonia.  No pneumothorax.  No significant pleural effusion.  Left lung remains clear.  IMPRESSION: Mild improvement in right upper lobe airspace disease which may represent post  biopsy hemorrhage.  No pneumothorax.   Original Report Authenticated By: Janeece Riggers, M.D.   Dg Chest Port 1 View  04/10/2013   CLINICAL DATA:  Post bronchoscopy  EXAM: PORTABLE CHEST - 1 VIEW  COMPARISON:  Prior chest x-ray same day  FINDINGS: The heart size and mediastinal contours are within normal limits. No diagnostic pneumothorax. Again noted spiculated lesion in right upper lobe measures about 3.4 cm. Persistent hazy alveolar airspace is in right upper lobe suspicious for hemorrhage or pneumonia. Slight improved aeration in right apex. Left lung is clear.  IMPRESSION: No diagnostic pneumothorax. Again noted spiculated lesion in right  upper lobe measures about 3.4 cm. Persistent hazy alveolar airspace is in right upper lobe suspicious for hemorrhage or pneumonia. Slight improved aeration in right apex. Left lung is clear.   Electronically Signed   By: Natasha Mead   On: 04/10/2013 10:08   Dg Chest Port 1 View  04/10/2013   *RADIOLOGY REPORT*  Clinical Data: Status post bronchoscopy.  PORTABLE CHEST - 1 VIEW  Comparison: January 03, 2013.  Findings: Cardiomediastinal silhouette appears normal.  No pneumothorax is noted.  Left lung is clear. There is interval development of large alveolar density seen in the right upper lobe; given the history of biopsy via bronchoscopy of right apical lesion, this is most consistent with pulmonary contusion or hemorrhage.  However, pneumonia cannot be excluded.  IMPRESSION: No pneumothorax is seen following bronchoscopy.  However, there is a large area of alveolar densities seen in the right upper lobe in the vicinity of the right apical lesion which was object of the biopsy via bronchoscopy, and therefore this is concerning for pulmonary contusion or hemorrhage.  Pneumonia cannot be excluded.   Original Report Authenticated By: Lupita Raider.,  M.D.   Dg C-arm Bronchoscopy  04/10/2013   CLINICAL DATA: bronch   C-ARM BRONCHOSCOPY  Fluoroscopy was utilized by the requesting physician.  No radiographic  interpretation.   agree/ improved aeration   Disposition:  01-Home or Self Care      Discharge Orders   Future Appointments Provider Department Dept Phone   04/18/2013 10:30 AM Nyoka Cowden, MD [facility] [phone]   09/18/2013 9:00 AM [facility] [phone]   Eat a light meal the night before the exam but please avoid gaseous foods.   Nothing to eat or drink for at least 8 hours prior to the exam. No gum chewing or smoking the morning of the exam. Please take your morning medications with small sips of water, especially blood pressure  medication. If you have several vascular lab exams and will see physician, please bring a snack with you.   09/18/2013 10:00 AM [facility] [phone]   09/18/2013 10:40 AM Carma Lair Nickel, NP [facility] [phone]   Future Orders Complete By Expires   Diet - low sodium heart healthy  As directed    Discharge instructions  As directed    Comments:     Resume plavix on Monday if not coughing up any more blood. If still having blood then would resume Wednesday instead.   Increase activity slowly  As directed        Medication List         acetaminophen 500 MG tablet  Commonly known as:  TYLENOL  Take 500 mg by mouth 2 (two) times daily as needed for pain.     cholecalciferol  1000 UNITS tablet  Commonly known as:  VITAMIN D  Take 1,000 Units by mouth daily.     clopidogrel 75 MG tablet  Commonly known as:  PLAVIX  Resume M/WF next week     furosemide 20 MG tablet  Commonly known as:  LASIX  Take 20 mg by mouth See admin instructions. Takes 2 tablets twice a week on Mon and Thurs as needed for edema.     hydrALAZINE 25 MG tablet  Commonly known as:  APRESOLINE  Take 25 mg by mouth 2 (two) times daily.     HYDROcodone-acetaminophen 5-325 MG per tablet  Commonly known as:  NORCO/VICODIN  Take 1-2 tablets by mouth every 4 (four) hours as needed.     HYDROcodone-homatropine 5-1.5 MG/5ML syrup  Commonly known as:  HYCODAN  Take 2.5 mLs by mouth every 6 (six) hours as needed for cough.     meclizine 25 MG tablet  Commonly known as:  ANTIVERT  Take 25 mg by mouth 3 (three) times daily as needed. For dizziness     omeprazole 20 MG capsule  Commonly known as:  PRILOSEC  Take 20 mg by mouth daily with breakfast.     pravastatin 20 MG tablet  Commonly known as:  PRAVACHOL  Take 20 mg by mouth daily at 6 PM.     PROAIR HFA 108 (90 BASE) MCG/ACT inhaler  Generic drug:  albuterol  Inhale 2 puffs into  the lungs every 6 (six) hours as needed for wheezing.     RESTASIS 0.05 % ophthalmic emulsion  Generic drug:  cycloSPORINE  Place 1 drop into both eyes every 12 (twelve) hours.     SENNA LAX PO  Take 1-2 tablets by mouth daily as needed. For constipation     traMADol 50 MG tablet  Commonly known as:  ULTRAM  Take 0.5 tablets (25 mg total) by mouth 2 (two) times daily as needed for pain.       Follow-up Information   Follow up with Sandrea Hughs, MD On 04/18/2013. (1030 am )    Specialty:  Pulmonary Disease   Contact information:   [address] [place] [address] [phone]       Discharged Condition: good  Physician Statement:   The Patient was personally examined, the discharge assessment and plan has been personally reviewed and I agree with ACNP Babcock's assessment and plan. > 30 minutes of time have been dedicated to discharge assessment, planning and discharge instructions.   Signed: BABCOCK,PETE 04/11/2013, 2:56 PM   Staff Dr. Kalman Shan, M.D., F.C.C.P Pulmonary and Critical Care Medicine Staff Physician [facility] Pager: [phone], If no answer or between  15:00h - 7:00h: call [phone]  04/16/2013 7:15 AM

## 2013-04-10 NOTE — H&P (Signed)
  Brief patient profile:  85 yobf quit smoking 2011 referred 03/14/13 to pulmonary clinic by Dr [name] for eval of lung nodule   HPI  03/14/2013 1st pulmonary eval cc sob x 5 weeks with anything but sitting still or lying down assoc with dry cough ? On ACei, indolent progressive > CT chest c/w Rapical mass and copd, no PE or effusion.  Also R > L bilateral should pain and stiffness over about the same period of time.  Some better with saba bur very confused with meds/ inhalers/ nebs which ones she's actually taking and whether or not she's still on ACEi  rec  Be sure you're not taking prinivil lisinorpil or zestoril  Continue tramadol for shoulder pain and let Dr [name] refill or change it if it's not strong enough  Increase omeprazole 20 mg Take 30- 60 min before your first and last meals of the day  GERD diet   03/28/2013 f/u ov/[name] f/u re copd and need for lung bx  Chief Complaint   Patient presents with   .  Follow-up     Pt states that her breathing is much improved since her last visit. No new co's today.   No obvious daytime variabilty or assoc chronic cough or cp or chest tightness, subjective wheeze overt sinus or hb symptoms. No unusual exp hx or h/o childhood pna/ asthma or knowledge of premature birth.  Imp apical mass, enlarging  rec fob   04/10/2013 FOB :  No new complaints   Sleeping ok without nocturnal or early am exacerbation of respiratory c/o's or need for noct saba. Also denies any obvious fluctuation of symptoms with weather or environmental changes or other aggravating or alleviating factors except as outlined above  Current Medications, Allergies, Past Medical History, Past Surgical History, Family History, and Social History were reviewed in [employer] record.    Current Medications, Allergies, Complete Past Medical History, Past Surgical History, Family History, and Social History were reviewed in [employer]  record.  ROS  The following are not active complaints unless bolded sore throat, dysphagia, dental problems, itching, sneezing,  nasal congestion or excess/ purulent secretions, ear ache,   fever, chills, sweats, unintended wt loss, pleuritic or exertional cp, hemoptysis,  orthopnea pnd or leg swelling, presyncope, palpitations, heartburn, abdominal pain, anorexia, nausea, vomiting, diarrhea  or change in bowel or urinary habits, change in stools or urine, dysuria,hematuria,  rash, arthralgias, visual complaints, headache, numbness weakness or ataxia or problems with walking or coordination,  change in mood/affect or memory.       Pex  HEENT mild turbinate edema.  Oropharynx no thrush or excess pnd or cobblestoning.  No JVD or cervical adenopathy. Mild accessory muscle hypertrophy. Trachea midline, nl thryroid. Chest was hyperinflated by percussion with diminished breath sounds and moderate increased exp time without wheeze. Hoover sign positive at mid inspiration. Regular rate and rhythm without murmur gallop or rub or increase P2 or edema.  Abd: no hsm, nl excursion. Ext warm without cyanosis or clubbing.    Imp:   R apical mass  Discussed in detail all the  indications, usual  risks and alternatives  relative to the benefits with patient who agrees to proceed with bronchoscopy with biopsy.    Nicole Hughs, MD Pulmonary and Critical Care Medicine [facility] Cell [phone] After 5:30 PM or weekends, call [phone]

## 2013-04-10 NOTE — Op Note (Addendum)
Bronchoscopy Procedure Note  Date of Operation: 04/10/2013   Pre-op Diagnosis: lung mass  Post-op Diagnosis: same  Surgeon: Sandrea Hughs  Anesthesia: Monitored Local Anesthesia with Sedation  Operation: Video Flexible fiberoptic bronchoscopy, diagnostic   Findings: nl airways  Specimen: bronchial washings RUL/ Tbbx RUL  Estimated Blood Loss: 25 cc   Complications: alv hem noted p tbbx and unable to wedge scope effectively so placed in RLat decub position and given one amp of epi into RUL /  monitored for excess bleeding   Indications and History: See updated H and P same date. The risks, benefits, complications, treatment options and expected outcomes were discussed with the patient.  The possibilities of reaction to medication, pulmonary aspiration, perforation of a viscus, bleeding, failure to diagnose a condition and creating a complication requiring transfusion or operation were discussed with the patient who freely signed the consent.    Description of Procedure: The patient was re-examined in the bronchoscopy suite and the site of surgery properly noted/marked.  The patient was identified  and the procedure verified as Flexible Fiberoptic Bronchoscopy.  A Time Out was held and the above information confirmed.   After the induction of topical nasopharyngeal anesthesia, the patient was positioned  and the bronchoscope was passed through the R naris. The vocal cords were visualized and  1% buffered lidocaine 5 ml was topically placed onto the cords. The cords were nl. The scope was then passed into the trachea.  1% buffered lidocaine given topically. Airways inspected bilaterally to the subsegmental level with the following findings:  All airways patent to subsegmental level although there was marked collapse with expiration  Interventions  Bronchial Washings RUL Tbbx RUL for histology x 4     The Patient was taken to the Endoscopy Recovery area in satisfactory  condition.  Attestation: I performed the procedure.  Sandrea Hughs, MD Pulmonary and Critical Care Medicine [facility] Cell [phone] After 5:30 PM or weekends, call [phone]

## 2013-04-10 NOTE — Progress Notes (Signed)
Post procedure note  Pt taken to endo recovery.  While in transport pt c/o chest discomfort while breathing and coughing.  Once in endo pt was placed on 2 lpm [place]. MD was called a second chest x-ray ordered.. RN was given orders for additional pain medication and Dr. Sherene Sires will have nurse practitioner assess pt.

## 2013-04-10 NOTE — Progress Notes (Signed)
Seen by PA.  Coughing up large amounts of blood-tinged mucous. Encouraged to keep coughing up productively.  The hemoptysis persists.

## 2013-04-10 NOTE — Progress Notes (Signed)
Pain evaluated as a 7 at this time.  Report called to [facility] nurse on 4W division.  Appears more comfortable and calm.  Will transfer to telemetry.

## 2013-04-10 NOTE — Progress Notes (Addendum)
Demerol order received from Dr. Sherene Sires to give 50 mg IV.  Demerol given, CXR ordered and completed.  Pt. In semifowlers position.  States feeling slightly better since Dermerol given Rate from 10 to 9.  PA called and will be down to evaluate pt.  Bp stable.  O2 remains at 2 with 98% sat.Wheezes noted with inspiration.  Lungs sound fairly clear in lower lobes.

## 2013-04-10 NOTE — Progress Notes (Signed)
Patient declines activation of MyChart-no computer 

## 2013-04-10 NOTE — Progress Notes (Signed)
Video Bronchoscopy done  Intervention Bronchial washings  done Intervention Bronchial biopsy done  Procedure tolerated well

## 2013-04-10 NOTE — H&P (Signed)
PULMONARY  / CRITICAL CARE MEDICINE  Name: [name] MRN: [id number] DOB: [birth year]    ADMISSION DATE:  04/23/2013 CONSULTATION DATE:  Apr 24, 2023  REFERRING MD :  Sherene Sires  PRIMARY SERVICE:  PCCM   CHIEF COMPLAINT:  CP and hemoptysis s/p FOB   BRIEF PATIENT DESCRIPTION:  This is a 79 year old female f/b Dr Sherene Sires in outpt setting for RUL lung mass. She underwent RUL washings and TbBx early in the am of 2023-04-24. Post-procedure had marked RUL CP, dyspnea and wheezing, followed by hemoptysis. PCXR suggesting new RUL hemorrhage. Will be admitted for CP and hemoptysis   SIGNIFICANT EVENTS / STUDIES:   RUL washings and TbBx 04-24-2023 Cytology 9/11>>>  LINES / TUBES:  CULTURES: Sputum 9/11>>>  ANTIBIOTICS:   HISTORY OF PRESENT ILLNESS:   This is a 79 year old female f/b Dr Sherene Sires in outpt setting for RUL lung mass. She underwent RUL washings and TbBx early in the am of 24-Apr-2023. Post-procedure had marked RUL CP, dyspnea and wheezing, followed by hemoptysis. PCXR suggesting new RUL hemorrhage. Will be admitted for CP and hemoptysis   PAST MEDICAL HISTORY :  Past Medical History  Diagnosis Date  . Stroke   . Arthritis   . Hypertension   . Hyperlipidemia   . Glaucoma   . Peripheral vascular disease, unspecified     with Claudication   Past Surgical History  Procedure Laterality Date  . Above knee leg amputation      Right  . Angioplasty / stenting iliac  03/22/09    Aortogram-  left common iliac artery  . Hemorrhoid surgery    . Tonsillectomy    . Colonoscopy  Feb. 2013   Prior to Admission medications   Medication Sig Start Date End Date Taking? Authorizing Provider  Acetaminophen (TYLENOL EXTRA STRENGTH PO) Take 500 mg by mouth as needed.     Historical Provider, MD  albuterol (PROAIR HFA) 108 (90 BASE) MCG/ACT inhaler Inhale 2 puffs into the lungs every 6 (six) hours as needed for wheezing.    Historical Provider, MD  albuterol (PROVENTIL) (2.5 MG/3ML) 0.083% nebulizer solution Take 2.5 mg by  nebulization as needed.    Historical Provider, MD  cholecalciferol (VITAMIN D) 1000 UNITS tablet Take 1,000 Units by mouth daily.    Historical Provider, MD  furosemide (LASIX) 20 MG tablet 2 tablets twice per wk    Historical Provider, MD  hydrALAZINE (APRESOLINE) 25 MG tablet Take 25 mg by mouth 3 (three) times daily.    Historical Provider, MD  meclizine (ANTIVERT) 25 MG tablet Take 25 mg by mouth 3 (three) times daily as needed. For dizziness    Historical Provider, MD  omeprazole (PRILOSEC) 20 MG capsule Take 20 mg by mouth 2 (two) times daily before a meal. 03/14/13   Nyoka Cowden, MD  pravastatin (PRAVACHOL) 20 MG tablet Take 20 mg by mouth daily.    Historical Provider, MD  RESTASIS 0.05 % ophthalmic emulsion Place 1 drop into both eyes every 12 (twelve) hours.  01/06/13   Historical Provider, MD  Sennosides (SENNA LAX PO) Take 1-2 tablets by mouth daily as needed. For constipation    Historical Provider, MD  traMADol (ULTRAM) 50 MG tablet Take 50 mg by mouth daily as needed. For pain    Historical Provider, MD   Allergies  Allergen Reactions  . Aspirin Anaphylaxis  . Morphine And Related Other (See Comments)    unknown    FAMILY HISTORY:  Family History  Problem Relation Age of Onset  . Stroke Brother   . Stomach cancer Mother   . Asthma Sister   . Colon cancer Brother    SOCIAL HISTORY:  reports that she quit smoking about 3 years ago. Her smoking use included Cigarettes. She has a 30 pack-year smoking history. She has never used smokeless tobacco. She reports that she does not drink alcohol or use illicit drugs.  REVIEW OF SYSTEMS positives are highlighted :   Constitutional: Negative for fever, chills, weight loss, malaise/fatigue and diaphoresis initially, now resolved.  HENT: Negative for hearing loss, ear pain, nosebleeds, congestion, sore throat, neck pain, tinnitus and ear discharge.   Eyes: Negative for blurred vision, double vision, photophobia, pain, discharge and  redness.  Respiratory:, hemoptysis, sputum production, no shortness of breath, wheezing, initially s/p FOB, now resolved and stridor.   Cardiovascular: chest pain, pleuritic in nature over right chest, palpitations, orthopnea, claudication, leg swelling and PND.  Gastrointestinal: Negative for heartburn, nausea, vomiting, abdominal pain, diarrhea, constipation, blood in stool and melena.  Genitourinary: Negative for dysuria, urgency, frequency, hematuria and flank pain.  Musculoskeletal: Negative for myalgias, back pain, joint pain and falls.  Skin: Negative for itching and rash.  Neurological: Negative for dizziness, tingling, tremors, sensory change, speech change, focal weakness, seizures, loss of consciousness, weakness and headaches.  Endo/Heme/Allergies: Negative for environmental allergies and polydipsia. Does not bruise/bleed easily.  SUBJECTIVE:  C/o Right sided CP VITAL SIGNS: Temp:  [98 F (36.7 C)] 98 F (36.7 C) (09/11 0917) Pulse Rate:  [66-100] 80 (09/11 0910) Resp:  [12-44] 18 (09/11 1000) BP: (95-163)/(40-83) 95/50 mmHg (09/11 1000) SpO2:  [90 %-100 %] 98 % (09/11 1000)  PHYSICAL EXAMINATION: General:  79 year old female, not in acute distress.  Neuro:  Awake, alert, no focal def  HEENT:  [place], no JVD  Cardiovascular:  rrr Lungs:  Crackles/rales on right  Abdomen:  Soft. nt + bowel sounds  Musculoskeletal:  RLE prosthesis   No results found for this basename: NA, K, CL, CO2, BUN, CREATININE, GLUCOSE,  in the last 168 hours No results found for this basename: HGB, HCT, WBC, PLT,  in the last 168 hours Dg Chest Port 1 View  04/10/2013   CLINICAL DATA:  Post bronchoscopy  EXAM: PORTABLE CHEST - 1 VIEW  COMPARISON:  Prior chest x-ray same day  FINDINGS: The heart size and mediastinal contours are within normal limits. No diagnostic pneumothorax. Again noted spiculated lesion in right upper lobe measures about 3.4 cm. Persistent hazy alveolar airspace is in right upper lobe  suspicious for hemorrhage or pneumonia. Slight improved aeration in right apex. Left lung is clear.  IMPRESSION: No diagnostic pneumothorax. Again noted spiculated lesion in right upper lobe measures about 3.4 cm. Persistent hazy alveolar airspace is in right upper lobe suspicious for hemorrhage or pneumonia. Slight improved aeration in right apex. Left lung is clear.   Electronically Signed   By: Natasha Mead   On: 04/10/2013 10:08   Dg Chest Port 1 View  04/10/2013   *RADIOLOGY REPORT*  Clinical Data: Status post bronchoscopy.  PORTABLE CHEST - 1 VIEW  Comparison: January 03, 2013.  Findings: Cardiomediastinal silhouette appears normal.  No pneumothorax is noted.  Left lung is clear. There is interval development of large alveolar density seen in the right upper lobe; given the history of biopsy via bronchoscopy of right apical lesion, this is most consistent with pulmonary contusion or hemorrhage.  However, pneumonia cannot be excluded.  IMPRESSION: No  pneumothorax is seen following bronchoscopy.  However, there is a large area of alveolar densities seen in the right upper lobe in the vicinity of the right apical lesion which was object of the biopsy via bronchoscopy, and therefore this is concerning for pulmonary contusion or hemorrhage.  Pneumonia cannot be excluded.   Original Report Authenticated By: Lupita Raider.,  M.D.  agree concerned about new RUL hemorrhage s/p bronch   ASSESSMENT / PLAN: 1) RUL Lung mass w/ new Chest pain/ hemoptysis and RUL infiltrate s/p bronch.  Think that this represents mild post bx intraparenchymal hemorrhage. Plan Admit to tele eval CEs, 12 lead EKG Sputum culture  Hold plavix, no heparin PRN analgesia  Cough suppression Supplemental oxygen   F/u cytology / biopsy results  2) COPD/emphysema Plan: Cont BDs Cont FIO2  3) H/O HTN Currently BP on low side Plan: Hold oral antihypertensives.   4) Prior CVA and PVD.  Plan: Will be holding plavix for now due  to hemoptysis  5) GERD Plan: Cont PPI  6) glaucoma Plan: Cont eye gtts.    Hope to discharge in 24 h   [facility] V.  Pulmonary and Critical Care Medicine [facility] Pager: [phone]  04/10/2013, 10:36 AM

## 2013-04-11 ENCOUNTER — Inpatient Hospital Stay (HOSPITAL_COMMUNITY): Payer: Medicare Other

## 2013-04-11 ENCOUNTER — Encounter (HOSPITAL_COMMUNITY): Payer: Self-pay | Admitting: Internal Medicine

## 2013-04-11 MED ORDER — HYDROCODONE-ACETAMINOPHEN 5-325 MG PO TABS: 1.0000 | ORAL_TABLET | ORAL | Status: DC | PRN

## 2013-04-11 MED ORDER — HYDROCODONE-HOMATROPINE 5-1.5 MG/5ML PO SYRP: 2.5000 mL | ORAL_SOLUTION | Freq: Four times a day (QID) | ORAL | Status: DC | PRN

## 2013-04-11 MED ORDER — TRAMADOL HCL 50 MG PO TABS: 25.0000 mg | ORAL_TABLET | Freq: Two times a day (BID) | ORAL | Status: DC | PRN

## 2013-04-11 MED ORDER — CLOPIDOGREL BISULFATE 75 MG PO TABS: ORAL_TABLET | ORAL | Status: DC

## 2013-04-11 NOTE — Progress Notes (Signed)
Spoke with pt concerning discharge needs, Home Health. Pt states she do not need any help at present time.

## 2013-04-16 ENCOUNTER — Encounter: Payer: Self-pay | Admitting: Internal Medicine

## 2013-04-18 ENCOUNTER — Encounter: Payer: Self-pay | Admitting: Internal Medicine

## 2013-04-18 ENCOUNTER — Ambulatory Visit (INDEPENDENT_AMBULATORY_CARE_PROVIDER_SITE_OTHER): Payer: Medicare Other | Admitting: Internal Medicine

## 2013-04-18 VITALS — BP 134/62 | HR 89 | Temp 98.1°F | Ht 60.0 in | Wt 163.0 lb

## 2013-04-18 DIAGNOSIS — R222 Localized swelling, mass and lump, trunk: Secondary | ICD-10-CM

## 2013-04-18 DIAGNOSIS — R0600 Dyspnea, unspecified: Secondary | ICD-10-CM

## 2013-04-18 DIAGNOSIS — J449 Chronic obstructive pulmonary disease, unspecified: Secondary | ICD-10-CM | POA: Insufficient documentation

## 2013-04-18 DIAGNOSIS — R0609 Other forms of dyspnea: Secondary | ICD-10-CM

## 2013-04-18 DIAGNOSIS — J441 Chronic obstructive pulmonary disease with (acute) exacerbation: Secondary | ICD-10-CM

## 2013-04-18 DIAGNOSIS — R918 Other nonspecific abnormal finding of lung field: Secondary | ICD-10-CM

## 2013-04-18 DIAGNOSIS — R0989 Other specified symptoms and signs involving the circulatory and respiratory systems: Secondary | ICD-10-CM

## 2013-04-18 HISTORY — DX: Chronic obstructive pulmonary disease, unspecified: J44.9

## 2013-04-18 NOTE — Patient Instructions (Signed)
Please see patient coordinator before you leave today  to schedule PET scan and I will call you with the next step when I have a chance to review

## 2013-04-18 NOTE — Progress Notes (Signed)
Subjective:    Patient ID: [name], female    DOB: Jan 31, 1928  MRN: [id number]    Brief patient profile:  46 yobf quit smoking 2011 referred 03/14/13 to pulmonary clinic by Dr Sharyn Lull for eval of lung nodule  HPI 03/14/2013 1st pulmonary eval cc sob x 5 weeks with anything but sitting still or lying down assoc with dry cough ? On ACei,  indolent progressive > CT chest c/w Rapical mass and copd, no PE or effusion. Also R > L bilateral should pain and stiffness over about the same period of time.   Some better with saba bur very confused with meds/ inhalers/ nebs which ones she's actually taking and whether or not she's still on ACEi rec Be sure you're not taking prinivil lisinorpil or zestoril Continue tramadol for shoulder pain and let Dr Sharyn Lull refill or change it if it's not strong enough Increase omeprazole 20 mg Take 30- 60 min before your first and last meals of the day  GERD diet   03/28/2013 f/u ov/[name] f/u re copd and need for lung bx  Chief Complaint  Patient presents with  . Follow-up    Pt states that her breathing is much improved since her last visit. No new co's today.   rec fob 9/11> neg bx / alv hem   04/18/2013 f/u ov/Hobson Lax re: lung mass Chief Complaint  Patient presents with  . HFU    Pt states her breathing is overall doing well, with less cough. She states using proair at least twice per day.    experienced pleuritic cp p bx but no effusion/ ptx/ cp almost completely gone  Sob walking across the room    No obvious day to day or daytime variabilty or assoc   or chest tightness, subjective wheeze overt sinus or hb symptoms. No unusual exp hx or h/o childhood pna/ asthma or knowledge of premature birth.  Sleeping ok without nocturnal  or early am exacerbation  of respiratory  c/o's or need for noct saba. Also denies any obvious fluctuation of symptoms with weather or environmental changes or other aggravating or alleviating factors except as outlined above    Current Medications, Allergies, Complete Past Medical History, Past Surgical History, Family History, and Social History were reviewed in [employer] record.  ROS  The following are not active complaints unless bolded sore throat, dysphagia, dental problems, itching, sneezing,  nasal congestion or excess/ purulent secretions, ear ache,   fever, chills, sweats, unintended wt loss, pleuritic or exertional cp, hemoptysis,  orthopnea pnd or leg swelling, presyncope, palpitations, heartburn, abdominal pain, anorexia, nausea, vomiting, diarrhea  or change in bowel or urinary habits, change in stools or urine, dysuria,hematuria,  rash, arthralgias, visual complaints, headache, numbness weakness or ataxia or problems with walking or coordination,  change in mood/affect or memory.                        Objective:   Physical Exam  amb bf needed 1 person assist and sob x 5 ft to exam table     Wt Readings from Last 3 Encounters:  04/18/13 163 lb (73.936 kg)  04/11/13 154 lb 3.2 oz (69.945 kg)  04/11/13 154 lb 3.2 oz (69.945 kg)       HEENT mild turbinate edema.  Oropharynx no thrush or excess pnd or cobblestoning.  No JVD or cervical adenopathy. Mild accessory muscle hypertrophy. Trachea midline, nl thryroid. Chest was hyperinflated by percussion with  diminished breath sounds and moderate increased exp time without wheeze. Hoover sign positive at mid inspiration. Regular rate and rhythm without murmur gallop or rub or increase P2 or edema.  Abd: no hsm, nl excursion. Ext warm without cyanosis or clubbing with R aka/prosthesis.     CT chest 03/11/13 1. 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 Tissue sampling recommended.  2. No evidence of metastatic disease.  3. Mild centrilobular emphysema.  pcxr 04/10/13  Mild improvement in right upper lobe airspace disease which may  represent post biopsy hemorrhage. No pneumothorax       Assessment & Plan:

## 2013-04-20 ENCOUNTER — Encounter: Payer: Self-pay | Admitting: Internal Medicine

## 2013-04-20 NOTE — Assessment & Plan Note (Signed)
1. 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 Tissue sampling recommended.  2. No evidence of metastatic disease.  3. Mild centrilobular emphysema. - FOB 04/10/2013 > nl airways x collapse on exp > tbbx RUL > small lymphoid aggregate  I thought the bx's were adequate but pathology doubts this sample is representative.  Looking at her today I strongly doubt she'd tol RULobectomy but might benefit from chemo/RT if we can establish a tissue dx.  Discussed in detail all the  indications, usual  risks and alternatives  relative to the benefits with patient who agrees to proceed with PET next

## 2013-04-30 ENCOUNTER — Encounter (HOSPITAL_COMMUNITY)
Admission: RE | Admit: 2013-04-30 | Discharge: 2013-04-30 | Disposition: A | Discharge status: Home / Self Care | Payer: Medicare Other | Source: Ambulatory Visit | Attending: Internal Medicine | Admitting: Internal Medicine

## 2013-04-30 DIAGNOSIS — D1430 Benign neoplasm of unspecified bronchus and lung: Secondary | ICD-10-CM | POA: Insufficient documentation

## 2013-04-30 DIAGNOSIS — R918 Other nonspecific abnormal finding of lung field: Secondary | ICD-10-CM

## 2013-04-30 LAB — GLUCOSE, CAPILLARY: Glucose-Capillary: 95 mg/dL (ref 70–99)

## 2013-04-30 MED ORDER — FLUDEOXYGLUCOSE F - 18 (FDG) INJECTION
17.2000 | Freq: Once | INTRAVENOUS | Status: AC | PRN
  Administered 2013-04-30: 17.2 via INTRAVENOUS

## 2013-05-01 ENCOUNTER — Encounter: Payer: Self-pay | Admitting: Internal Medicine

## 2013-05-05 ENCOUNTER — Telehealth: Payer: Self-pay | Admitting: Internal Medicine

## 2013-05-05 NOTE — Telephone Encounter (Signed)
These are the exact kinds of details the surgeon will go over with her and if she's been cleared by cards then I strongly rec  go ahead and refer to gerheardt and hendrickson  If she still declines set up ov with her fm present to regroup and "put the card on the table"

## 2013-05-05 NOTE — Telephone Encounter (Signed)
I spoke with pt. She wants to know what will happen if she does not have the surgery to have tumor removed? Are there any tests she will have to have done before and after surgery? What are the risks/complications of the surgery? She stated she wants to know anything and everything Dr. Sherene Sires can tell her and does not want him to hold back. She also reports she saw her cardiologists last week and was told her heart was fine to have surgery. Please advise MW thanks

## 2013-05-05 NOTE — Telephone Encounter (Signed)
I spoke with pt and she wants to set up OV with MW. appt scheduled and nothing further needed

## 2013-05-07 ENCOUNTER — Ambulatory Visit: Payer: Medicare Other | Admitting: Internal Medicine

## 2013-05-12 ENCOUNTER — Other Ambulatory Visit: Payer: Self-pay | Admitting: *Deleted

## 2013-05-12 ENCOUNTER — Encounter: Payer: Self-pay | Admitting: Internal Medicine

## 2013-05-12 ENCOUNTER — Ambulatory Visit (INDEPENDENT_AMBULATORY_CARE_PROVIDER_SITE_OTHER): Payer: Medicare Other | Admitting: Internal Medicine

## 2013-05-12 VITALS — BP 144/80 | HR 74 | Temp 98.1°F | Ht 60.0 in | Wt 160.0 lb

## 2013-05-12 DIAGNOSIS — R222 Localized swelling, mass and lump, trunk: Secondary | ICD-10-CM

## 2013-05-12 DIAGNOSIS — J449 Chronic obstructive pulmonary disease, unspecified: Secondary | ICD-10-CM

## 2013-05-12 DIAGNOSIS — R918 Other nonspecific abnormal finding of lung field: Secondary | ICD-10-CM

## 2013-05-12 NOTE — Assessment & Plan Note (Signed)
CT chest 03/11/13  1. 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 Tissue sampling recommended.  2. No evidence of metastatic disease.  3. Mild centrilobular emphysema. - FOB 04/10/2013 > nl airways x collapse on exp > tbbx RUL > small lymphoid aggregate - PETscheduled  04/30/13 > c/w T1N1M0 >  05/12/2013 rec consider RULobectomy vs wedge and RT> referred for T surg eval  I had an extended discussion with the patient today lasting 15 to 20 minutes of a 25 minute visit on the following issues:  Discussed in detail all the  indications, usual  risks and alternatives  relative to the benefits with patient who agrees to proceed with t surgery opinion as Dr Sharyn Lull has cleared her heart for surgery and she could tol RULobectomy if T surgery agrees.

## 2013-05-12 NOTE — Progress Notes (Signed)
Subjective:    Patient ID: [name], female    DOB: [birth year]  MRN: [id number]    Brief patient profile:  71 yobf quit smoking 2011 referred 03/14/13 to pulmonary clinic by Dr Sharyn Lull for eval of lung nodule.    HPI 03/14/2013 1st pulmonary eval cc sob x 5 weeks with anything but sitting still or lying down assoc with dry cough ? On ACei,  indolent progressive > CT chest c/w R apical mass and copd, no PE or effusion. Also R > L bilateral should pain and stiffness over about the same period of time.   Some better with saba bur very confused with meds/ inhalers/ nebs which ones she's actually taking and whether or not she's still on ACEi rec Be sure you're not taking prinivil lisinorpil or zestoril Continue tramadol for shoulder pain and let Dr Sharyn Lull refill or change it if it's not strong enough Increase omeprazole 20 mg Take 30- 60 min before your first and last meals of the day  GERD diet   03/28/2013 f/u ov/Shravan Salahuddin f/u re copd and need for lung bx  Chief Complaint  Patient presents with  . Follow-up    Pt states that her breathing is much improved since her last visit. No new co's today.   rec fob 9/11> neg bx / alv hem   04/18/2013 f/u ov/[name] re: lung mass Chief Complaint  Patient presents with  . HFU    Pt states her breathing is overall doing well, with less cough. She states using proair at least twice per day.   Experienced pleuritic cp p bx but no effusion/ ptx/ cp almost completely gone  Sob walking across the room. rec PET >  C/w T1N1 dz RUL    05/12/2013 f/u ov/Anuar Walgren re:  Chief Complaint  Patient presents with  . Follow-up    Pt states here to discuss surgery options. She c/o having back pain that wraps around to underneath breasts x 1 wk. No changes in her breathing.    Pain is positional and present for 6 months, just worse for the last week. No limiting sob, no cough.   Not using any inhalers at all   No obvious day to day or daytime variabilty or assoc cp,  chest tightness, subjective wheeze overt sinus or hb symptoms. No unusual exp hx or h/o childhood pna/ asthma or knowledge of premature birth.  Sleeping ok without nocturnal  or early am exacerbation  of respiratory  c/o's or need for noct saba. Also denies any obvious fluctuation of symptoms with weather or environmental changes or other aggravating or alleviating factors except as outlined above   Current Medications, Allergies, Complete Past Medical History, Past Surgical History, Family History, and Social History were reviewed in [employer] record.  ROS  The following are not active complaints unless bolded sore throat, dysphagia, dental problems, itching, sneezing,  nasal congestion or excess/ purulent secretions, ear ache,   fever, chills, sweats, unintended wt loss, pleuritic or exertional cp, hemoptysis,  orthopnea pnd or leg swelling, presyncope, palpitations, heartburn, abdominal pain, anorexia, nausea, vomiting, diarrhea  or change in bowel or urinary habits, change in stools or urine, dysuria,hematuria,  rash, arthralgias, visual complaints, headache, numbness weakness or ataxia or problems with walking or coordination,  change in mood/affect or memory.                        Objective:   Physical Exam  amb bf  no longer needing assist to get on exam table, much more robust  05/12/2013       160  Wt Readings from Last 3 Encounters:  04/18/13 163 lb (73.936 kg)  04/11/13 154 lb 3.2 oz (69.945 kg)  04/11/13 154 lb 3.2 oz (69.945 kg)       HEENT mild turbinate edema.  Oropharynx no thrush or excess pnd or cobblestoning.  No JVD or cervical adenopathy. Mild accessory muscle hypertrophy. Trachea midline, nl thryroid. Chest was min hyperinflated by percussion with diminished breath sounds and mild  increased exp time without wheeze. Hoover sign positive at very end of inspiration. Regular rate and rhythm without murmur gallop or rub or increase P2 or  edema.  Abd: no hsm, nl excursion. Ext warm without cyanosis or clubbing with R aka/prosthesis.     CT chest 03/11/13 1. 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 Tissue sampling recommended.  2. No evidence of metastatic disease.  3. Mild centrilobular emphysema.  pcxr 04/10/13  Mild improvement in right upper lobe airspace disease which may  represent post biopsy hemorrhage. No pneumothorax      Assessment & Plan:

## 2013-05-12 NOTE — Assessment & Plan Note (Signed)
-  spirometry 04/18/2013 > FEV1  1.08 (84%) ratio 54   Not limited by breathing at all and could tol RULobectomy assuming no complications, though would need to accept the usual risks involved

## 2013-05-12 NOTE — Patient Instructions (Addendum)
Please see patient coordinator before you leave today  to schedule evaluation by T surgery and call me if you have questions after the evaluation

## 2013-05-13 ENCOUNTER — Institutional Professional Consult (permissible substitution) (INDEPENDENT_AMBULATORY_CARE_PROVIDER_SITE_OTHER): Payer: Medicare Other | Admitting: Cardiothoracic Surgery

## 2013-05-13 ENCOUNTER — Ambulatory Visit (HOSPITAL_COMMUNITY)
Admission: RE | Admit: 2013-05-13 | Discharge: 2013-05-13 | Disposition: A | Discharge status: Home / Self Care | Payer: Medicare Other | Source: Ambulatory Visit | Attending: Cardiothoracic Surgery | Admitting: Cardiothoracic Surgery

## 2013-05-13 ENCOUNTER — Other Ambulatory Visit: Payer: Self-pay | Admitting: *Deleted

## 2013-05-13 ENCOUNTER — Encounter: Payer: Self-pay | Admitting: Cardiothoracic Surgery

## 2013-05-13 ENCOUNTER — Other Ambulatory Visit: Payer: Medicare Other

## 2013-05-13 VITALS — BP 170/80 | HR 88 | Resp 20 | Ht 60.0 in | Wt 160.0 lb

## 2013-05-13 DIAGNOSIS — R0989 Other specified symptoms and signs involving the circulatory and respiratory systems: Secondary | ICD-10-CM | POA: Insufficient documentation

## 2013-05-13 DIAGNOSIS — R918 Other nonspecific abnormal finding of lung field: Secondary | ICD-10-CM

## 2013-05-13 DIAGNOSIS — R222 Localized swelling, mass and lump, trunk: Secondary | ICD-10-CM | POA: Insufficient documentation

## 2013-05-13 DIAGNOSIS — J988 Other specified respiratory disorders: Secondary | ICD-10-CM | POA: Insufficient documentation

## 2013-05-13 DIAGNOSIS — R0609 Other forms of dyspnea: Secondary | ICD-10-CM | POA: Insufficient documentation

## 2013-05-13 LAB — PULMONARY FUNCTION TEST

## 2013-05-13 MED ORDER — ALBUTEROL SULFATE (5 MG/ML) 0.5% IN NEBU
2.5000 mg | INHALATION_SOLUTION | Freq: Once | RESPIRATORY_TRACT | Status: AC
  Administered 2013-05-13: 2.5 mg via RESPIRATORY_TRACT

## 2013-05-13 NOTE — Patient Instructions (Signed)
Lung Cancer  Lung cancer is a tumor which starts as a growth in your lungs. Cancer is a group of many related diseases that begin in cells, the building blocks of the body. Normally, cells grow and divide to produce more cells only when the body needs them. Sometimes cells keep dividing when new cells are not needed. These extra cells may form a mass of tissue called a growth or tumor. Tumors can be either benign (not cancerous) or malignant (cancerous). Cancer can begin in any organ or tissue of the body. The original tumor (where the tumor started out) is called the primary cancer and is usually named for where it begins.   Lung cancer is the most common cause of cancer death in men and women. There are several different types of lung cancers. Usually, lung cancer is described as either small-cell lung cancer or non-small-cell lung cancer. Other types of cancer occur in the lungs, including carcinoid and cancers spread from other organs. The types of cancer have different behavior and treatment.  CAUSES   This cancer usually starts when the lungs are exposed to harmful chemicals. When you quit smoking, your risk of lung cancer falls each year (but is never the same as a person who has never smoked).   Other risks include:    Radon gas exposure.   Asbestos and other industrial substance exposure.   Second hand tobacco smoke.   Air pollution.   Family or personal history of lung cancer.   Age over 65.  SYMPTOMS   Lung cancer can cause many symptoms. They depend on the type of cancer, its location and other factors.  Symptoms of lung cancer can include:   Cough (either new, different or more severe).   Shortness of breath.   Coughing up blood (hemoptysis).   Chest pain.   Hoarseness.   Swelling of the face.   Drooping eyelid.   Changes in blood tests: low sodium (hyponatremia), high calcium (hypercalcemia) or low blood count (anemia).   Weight loss.  In its early stages, lung cancer may not have  symptoms and can be discovered by accident. Many of the symptoms above can be caused by diseases other than lung cancer.  DIAGNOSIS   In early lung cancer, the patient often does not notice problems. It usually has spread by the time problems are first noticed. Your caregiver may suspect lung cancer based on your symptoms, your exam or based on tests (such as x-rays) obtained for other reasons. Common tests that help your caregiver diagnose your condition include:   Chest x-ray.   CT scan of the lungs and chest.   Blood tests.  If a tumor is found, a biopsy will be necessary to confirm that cancer is present and to determine the type of cancer.  TREATMENT    Surgery offers a hope for a cure if the cancer has not spread and the cancer is not a small cell (oat cell) cancer of the lung. Surgery cannot cure the small cell type of cancer.   Radiation Therapy is a form of high energy X-ray that helps slow or kill the cancer. It is often used along with medications (chemotherapy) to help treat the cancer and control pain.   Chemotherapy is used in combination with surgery in advanced cancer. It is also used in all small cell cancers.   Many new treatments look promising.   Your caregiver can give you more information and discuss treatment options that are best for   your type of cancer.  HOME CARE INSTRUCTIONS    If you smoke, stop!   Take all medications as told.   Keep all appointments with your caregiver and other specialists.   Ask your caregiver if you should see a cancer specialist, if that has not been arranged.   If you require oxygen or breathing equipment, be sure you know how to use it and who to call with questions.   Follow any special diet directions. If you have problems with appetite, ask your caregiver for help.  SEEK MEDICAL CARE IF:    You have had a surgical procedure are you are having trouble recovering.   You have ongoing weight loss.   You have decreased strength or energy past the  point when your caregiver said you would feel better.   You develop nausea or lightheadedness.   You have pain that is not improving.  SEEK IMMEDIATE MEDICAL CARE IF:    You cough up clotted blood or bright red blood.   Your pain is uncontrolled.   You develop new difficulty breathing or chest pain.   You develop swelling in one or both ankles or legs, or swelling in your face or neck.   You develop new headache or confusion.  Document Released: 10/23/2000 Document Revised: 10/09/2011 Document Reviewed: 08/03/2008  Exit[employer] Patient Information 2014 [employer].

## 2013-05-13 NOTE — Progress Notes (Signed)
[address]       [place] [address]             [phone]                    KATRYN PLUMMER [facility] Medical Record #[id number] Date of Birth: 13-Feb-1928  Referring: Nyoka Cowden, MD Primary Care: Florentina Jenny, MD  Chief Complaint:    Chief Complaint  Patient presents with  . Lung Mass    Surgical eval on Lung mass, Chest CT 03/11/13, PET scan 04/30/13, Path 04/10/13, PFT's 05/13/13    History of Present Illness:    Patient is an 79 year old female referred by Dr. Sherene Sires for a 3.5 cm right upper lobe lung mass highly suspicious for carcinoma the lung. The patient had a chest x-ray in July of 2013 which commented on possible right upper lung lesion. Subsequent chest x-ray almost a year later showed this area to be more prominent. A CT scan of the chest was performed leading to a PET scan and bronchoscopy. The patient developed shortness of breath and pulmonary bleed following bronchoscopy and was hospitalized for several days ultimately discharged home. The path was negative. She is now referred to thoracic surgery office for evaluation of treatment options.  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 The patient's granddaughter notes that she does get short of breath with exertion but is able to walk around the grocery store with assistance. In larger stores she usually rides a cart. The patient has been a long-term smoker more than 50 years and quit, quit in 2011.     Current Activity/ Functional Status:  Patient is independent with mobility/ambulation, transfers, ADL's, IADL's.  Zubrod Score: At the time of surgery this patient's most appropriate activity status/level should be described as: []  Normal activity, no symptoms []  Symptoms, fully ambulatory [x]  Symptoms, in bed less than or  equal to 50% of the time []  Symptoms, in bed greater than 50% of the time but less than 100% []  Bedridden []  Moribund   Past Medical History  Diagnosis Date  . Stroke   . Arthritis   . Hypertension   . Hyperlipidemia   . Glaucoma   . Peripheral vascular disease, unspecified     with Claudication  . COPD GOLD I 04/18/2013    Past Surgical History  Procedure Laterality Date  . Above knee leg amputation- [place]  1993    Right  . Angioplasty / stenting iliac  03/22/09    Aortogram-  left common iliac artery  . Hemorrhoid surgery    . Tonsillectomy    . Colonoscopy  Feb. 2013  . Video bronchoscopy Bilateral 04/10/2013    Procedure: VIDEO BRONCHOSCOPY WITH FLUORO;  Surgeon: Nyoka Cowden, MD;  Location: [facility];  Service: Cardiopulmonary;  Laterality: Bilateral;    Family History  Problem Relation Age of Onset  . Stroke Brother   . Stomach cancer Mother   . Asthma Sister   . Colon cancer Brother     History   Social History  . Marital Status: Widowed    Spouse Name: N/A    Number of Children: 1  . Years of Education: N/A   Occupational History  . 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 Smoking status: Former Smoker -- 0.50 packs/day for 60 years    Types: Cigarettes    Quit date: 07/31/2009  . Smokeless tobacco: Never Used  . Alcohol Use: No  . Drug Use: No  . Sexual Activity: No          Social History Narrative   Pt lives alone. She is HOH, especially left ear.    History  Smoking status  . Former Smoker -- 0.50 packs/day for 60 years  . Types: Cigarettes  . Quit date: 07/31/2009  Smokeless tobacco  . Never Used    History  Alcohol Use No     Allergies  Allergen Reactions  . Aspirin Anaphylaxis  . Morphine And Related Other (See Comments)    confusion    Current Outpatient Prescriptions  Medication Sig Dispense Refill  . acetaminophen (TYLENOL) 500 MG  tablet Take 500 mg by mouth 2 (two) times daily as needed for pain.      Marland Kitchen albuterol (PROAIR HFA) 108 (90 BASE) MCG/ACT inhaler Inhale 2 puffs into the lungs every 6 (six) hours as needed for wheezing.      . cholecalciferol (VITAMIN D) 1000 UNITS tablet Take 1,000 Units by mouth daily.      . clopidogrel (PLAVIX) 75 MG tablet Resume M/WF next week      . furosemide (LASIX) 20 MG tablet Take 20 mg by mouth See admin instructions. Takes 2 tablets twice a week on Mon and Thurs as needed for edema.      . hydrALAZINE (APRESOLINE) 25 MG tablet Take 25 mg by mouth 2 (two) times daily.      Marland Kitchen HYDROcodone-acetaminophen (NORCO/VICODIN) 5-325 MG per tablet Take 1-2 tablets by mouth every 4 (four) hours as needed.  30 tablet  0  . HYDROcodone-homatropine (HYCODAN) 5-1.5 MG/5ML syrup Take 2.5 mLs by mouth every 6 (six) hours as needed for cough.  120 mL  0  . meclizine (ANTIVERT) 25 MG tablet Take 25 mg by mouth 3 (three) times daily as needed. For dizziness      . omeprazole (PRILOSEC) 20 MG capsule Take 20 mg by mouth daily as needed.       . pravastatin (PRAVACHOL) 20 MG tablet Take 20 mg by mouth daily at 6 PM.      . RESTASIS 0.05 % ophthalmic emulsion Place 1 drop into both eyes every 12 (twelve) hours.       . Sennosides (SENNA LAX PO) Take 1-2 tablets by mouth daily as needed. For constipation      . traMADol (ULTRAM) 50 MG tablet Take 0.5 tablets (25 mg total) by mouth 2 (two) times daily as needed for pain.  30 tablet     No current facility-administered medications for this visit.       Review of Systems:     Cardiac Review of Systems: Y or N  Chest Pain [   n ]  Resting SOB [n   ] Exertional SOB  Cove.Etienne  ]  Orthopnea [ n ]   Pedal Edema [ n  ]    Palpitations [n  ] Syncope  [ n ]   Presyncope [  y ]  General Review of Systems: [Y] = yes [  ]=no Constitional: recent weight change [ n ]; anorexia [  n]; fatigue [ y ]; nausea [n  ]; night sweats [ n ]; fever [ n ]; or chills [n  ];  Dental: poor dentition[ n ]; Last Dentist visit:   Eye : blurred vision [  ]; diplopia [   ]; vision changes [ n ];  Amaurosis fugax[n  ]; history of 3 tis but does not remember symptoms Resp: cough [ y ];  wheezing[n  ];  hemoptysis[n  ]; shortness of breath[y  ]; paroxysmal nocturnal dyspnea[n  ]; dyspnea on exertion[  y]; or orthopnea[  ];  GI:  gallstones[  ], vomiting[ n ];  dysphagia[  ]; melena[  ];  hematochezia [ n ]; heartburn[ n ];   Hx of  Colonoscopy[  ]; GU: kidney stones [  ]; hematuria[  ];   dysuria [  ];  nocturia[  ];  history of     obstruction [  ]; urinary frequency [  ]             Skin: rash, swelling[  ];, hair loss[  ];  peripheral edema[  ];  or itching[  ]; Musculosketetal: myalgias[  ];  joint swelling[  ];  joint erythema[  ];  joint pain[  ];  back pain[  ];  Heme/Lymph: bruising[  ];  bleeding[  ];  anemia[  ];  Neuro: TIA[  ];  headaches[  ];  stroke[  ];  vertigo[  ];  seizures[ n ];   paresthesias[  ];  difficulty walking[ y ];  Psych:depression[  ]; anxiety[  ];  Endocrine: diabetes[ n ];  thyroid dysfunction[n  ];  Immunizations: Flu [ y ]; Pneumococcal[n  ];  Other:  Physical Exam: BP 170/80  Pulse 88  Resp 20  Ht 5' (1.524 m)  Wt 160 lb (72.576 kg)  BMI 31.25 kg/m2  SpO2 96%  General appearance: alert, cooperative and appears stated age Neurologic: intact Heart: regular rate and rhythm, S1, S2 normal, no murmur, click, rub or gallop Lungs: clear to auscultation bilaterally Abdomen: soft, non-tender; bowel sounds normal; no masses,  no organomegaly Extremities: extremities normal, atraumatic, no cyanosis or edema and rt aka, no pedal pulses on left, no carotid bruits Patient has no cervical supraclavicular or axillary adenopathy   Diagnostic Studies & Laboratory data:     Recent Radiology Findings:  Nm Pet Image Initial  (pi) Skull Base To Thigh  04/30/2013   CLINICAL DATA:  Initial treatment strategy for pulmonary nodule.  EXAM: NUCLEAR MEDICINE PET SKULL BASE TO THIGH  FASTING BLOOD GLUCOSE:  Value:  95 mg/dl  TECHNIQUE: 47.8 mCi G-95 FDG was injected intravenously. CT data was obtained and used for attenuation correction and anatomic localization only. (This was not acquired as a diagnostic CT examination.) Additional exam technical data entered on technologist worksheet.  COMPARISON:  CT thorax 03/11/2013  FINDINGS: NECK  No hypermetabolic lymph nodes in the neck.  CHEST  Within the right upper lobe there is a hypermetabolic spiculated mass measuring 34 x 28 mm with SUV max = 23.5. No additional hypermetabolic nodules are identified. There is a single hypermetabolic right hilar lymph node which is difficult to define on the CT portion but felt to correlate to a 12 mm node on image 77 with SUV max = max 7.4. No discrete hypermetabolic mediastinal lymph nodes.  ABDOMEN/PELVIS  No abnormal hypermetabolic activity within the liver, pancreas, adrenal glands, or spleen. No hypermetabolic lymph nodes in the abdomen or pelvis.  SKELETON  No focal hypermetabolic activity to suggest skeletal metastasis.  IMPRESSION: Hypermetabolic right upper lobe mass without right hilar nodal metastasis. Staging by FDG PET/CT imaging  T2a N1 M0 .   Electronically Signed   By: Genevive Bi M.D.   On: 04/30/2013 16:09   RADIOLOGY REPORT*  02/15/2012 Clinical Data: Shortness of breath and weakness.  CHEST - 2 VIEW  Comparison: 01/29/2009.  Findings: Trachea is midline. Heart size normal. Thoracic aorta  is calcified. There is added density in the apex of the right  hemithorax, in the region of the right first costochondral  junction, more prominent than on 01/29/2009. Minimal scarring at  the left lung base. Lungs are otherwise clear. No pleural fluid.  Degenerative changes are seen in the mid thoracic spine.  IMPRESSION:  Added density in  the region of the right first costochondral  junction, increased in prominence from 01/29/2009. In this patient  with a smoking history, non emergent of CT chest without contrast  would be helpful in further evaluation, as clinically indicated, as  a pulmonary nodule cannot be excluded. These results will be called  to the ordering clinician or representative by the Radiologist  Assistant, and communication documented in the PACS Dashboard.  Original Report Authenticated By: Reyes Ivan, M.D.   RADIOLOGY REPORT*  Clinical Data: Cough and shortness of breath. Known apical mass.  CHEST - 2 VIEW  Comparison: 02/15/2012.  Findings: The cardiac silhouette, mediastinal and hilar contours  are normal and stable. Mild tortuosity and calcification of the  thoracic aorta. The pulmonary hila appear normal. There is a  right apical lung lesion worrisome for neoplasm and definitely  progressive since prior chest x-ray. No infiltrates or effusions.  The bony thorax is intact.  IMPRESSION:  Right apical lesion. Recommend chest CT for further evaluation.  No acute infiltrates or effusions.  Original Report Authenticated By: Rudie Meyer, M.D.      RADIOLOGY REPORT*  Clinical Data: Enlarging right apical mass on recent chest  radiographs. Cough with night sweats and hoarseness.  CT CHEST WITH CONTRAST  Technique: Multidetector CT imaging of the chest was performed  following the standard protocol during bolus administration of  intravenous contrast.  Contrast: OMNIPAQUE IOHEXOL 300 MG/ML SOLN  Comparison: Chest radiographs 03/05/2013 and 02/15/2012.  Findings: Corresponding with the radiographic finding is a  spiculated right apical mass measuring 3.6 x 2.9 x 2.0 cm  consistent with bronchogenic carcinoma. This lesion demonstrates  no chest wall involvement. No other suspicious pulmonary nodules  demonstrated. There is mild centrilobular emphysema.  Small AP window and right hilar  lymph nodes are not pathologically  enlarged. There is no pleural or pericardial effusion.  There is mild atherosclerosis of the aorta, great vessels and  coronary arteries. No suspicious findings are present within the  upper abdomen. There are small calcified splenic granulomas.  There is mild reflux of contrasted blood into the IVC and hepatic  veins. There are no worrisome osseous findings.  IMPRESSION:  1. 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 Tissue sampling recommended.  2. No evidence of metastatic disease.  3. Mild centrilobular emphysema.  Original Report Authenticated By: Carey Bullocks, M.D.   Recent Lab Findings: Lab Results  Component Value Date   WBC 7.0 04/10/2013   HGB 13.3 04/10/2013   HCT 40.9 04/10/2013   PLT 238 04/10/2013   GLUCOSE 108* 04/10/2013   ALT 10 04/10/2013   AST 15 04/10/2013   NA 137 04/10/2013   K 4.1 04/10/2013   CL 105 04/10/2013   CREATININE 0.86 04/10/2013   BUN 13 04/10/2013   CO2 25 04/10/2013   INR  1.14 04/10/2013   PFT's ;Attempted DLCO but unable to do, FEV1 1.1 82% - 6% increase with bronchiodilators watled in office more then 5 min- became SOB O2 sat 98% on RA  Assessment / Plan:   Clinicial cT2a,cN1,cM0 Carcinoma of  the right Upper Lung Limitied mobility secondary to rt leg ambulation and peripherial vascular disease Mild COPD, unable to do DLCO  Have spent more than an hour face-to-face with the patient and her granddaughter reviewing the radiographic findings the probable diagnosis of clinical stage IIB carcinoma the lung. Consideration for surgical resection was discussed with the patient. At this point she is not sure how she would like to proceed. We will obtain a MRI of the brain over the next one to 2 days and I will plan to see her back on October 16 2 further review the studies and treatment options.    Delight Ovens MD      [address] [place] [place] [address] Office  [phone]   Beeper [phone]  05/13/2013 4:58 PM

## 2013-05-14 ENCOUNTER — Ambulatory Visit
Admission: RE | Admit: 2013-05-14 | Discharge: 2013-05-14 | Disposition: A | Discharge status: Home / Self Care | Payer: Medicare Other | Source: Ambulatory Visit | Attending: Cardiothoracic Surgery | Admitting: Cardiothoracic Surgery

## 2013-05-14 DIAGNOSIS — R918 Other nonspecific abnormal finding of lung field: Secondary | ICD-10-CM

## 2013-05-14 MED ORDER — GADOBENATE DIMEGLUMINE 529 MG/ML IV SOLN: 15.0000 mL | Freq: Once | INTRAVENOUS | Status: AC | PRN

## 2013-05-15 ENCOUNTER — Ambulatory Visit (INDEPENDENT_AMBULATORY_CARE_PROVIDER_SITE_OTHER): Payer: Medicare Other | Admitting: Cardiothoracic Surgery

## 2013-05-15 ENCOUNTER — Telehealth: Payer: Self-pay | Admitting: Internal Medicine

## 2013-05-15 ENCOUNTER — Other Ambulatory Visit: Payer: Medicare Other

## 2013-05-15 ENCOUNTER — Encounter: Payer: Self-pay | Admitting: Cardiothoracic Surgery

## 2013-05-15 VITALS — BP 156/70 | HR 85 | Resp 20 | Ht 60.0 in | Wt 160.0 lb

## 2013-05-15 DIAGNOSIS — R918 Other nonspecific abnormal finding of lung field: Secondary | ICD-10-CM

## 2013-05-15 DIAGNOSIS — R222 Localized swelling, mass and lump, trunk: Secondary | ICD-10-CM

## 2013-05-15 NOTE — Telephone Encounter (Signed)
Discussed with pt, she is favoring surgery and I supported that

## 2013-05-15 NOTE — Progress Notes (Signed)
[address]       [place] [address]             [id number]                        DESHAUN SCHOU [facility] Medical Record #[id number] Date of Birth: 02-Mar-1928  Referring: Nyoka Cowden, MD Primary Care: Florentina Jenny, MD  Chief Complaint:    Chief Complaint  Patient presents with  . Lung Mass    Surgical eval on Lung mass, Chest CT 03/11/13, PET scan 04/30/13, Path 04/10/13, PFT's 05/13/13    History of Present Illness:    Patient is an 79 year old female referred by Dr. Sherene Sires for a 3.5 cm right upper lobe lung mass highly suspicious for carcinoma the lung. The patient had a chest x-ray in July of 2013 which commented on possible right upper lung lesion. Subsequent chest x-ray almost a year later showed this area to be more prominent. A CT scan of the chest was performed leading to a PET scan and bronchoscopy. The patient developed shortness of breath and pulmonary bleed following bronchoscopy and was hospitalized for several days ultimately discharged home. The path was negative. She is now referred to thoracic surgery office for evaluation of treatment options.  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 The patient's granddaughter notes that she does get short of breath with exertion but is able to walk around the grocery store with assistance. In larger stores she usually rides a cart. The patient has been a long-term smoker more than 50 years and quit in 2011.     Current Activity/ Functional Status:  Patient is independent with mobility/ambulation, transfers, ADL's, IADL's.  Zubrod Score: At the time of surgery this patient's most appropriate activity status/level should be described as: []  Normal activity, no symptoms []  Symptoms, fully ambulatory [x]  Symptoms, in bed less  than or equal to 50% of the time []  Symptoms, in bed greater than 50% of the time but less than 100% []  Bedridden []  Moribund   Past Medical History  Diagnosis Date  . Stroke   . Arthritis   . Hypertension   . Hyperlipidemia   . Glaucoma   . Peripheral vascular disease, unspecified     with Claudication  . COPD GOLD I 04/18/2013    Past Surgical History  Procedure Laterality Date  . Above knee leg amputation- [place]  1993    Right  . Angioplasty / stenting iliac  03/22/09    Aortogram-  left common iliac artery  . Hemorrhoid surgery    . Tonsillectomy    . Colonoscopy  Feb. 2013  . Video bronchoscopy Bilateral 04/10/2013    Procedure: VIDEO BRONCHOSCOPY WITH FLUORO;  Surgeon: Nyoka Cowden, MD;  Location: [facility];  Service: Cardiopulmonary;  Laterality: Bilateral;    Family History  Problem Relation Age of Onset  . Stroke Brother   . Stomach cancer Mother   . Asthma Sister   . Colon cancer Brother     History   Social History  . Marital Status: Widowed    Spouse Name: N/A    Number of Children: 1  . Years of Education: N/A   Occupational History  . 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 Smoking status: Former Smoker -- 0.50 packs/day for 60 years    Types: Cigarettes    Quit date: 07/31/2009  . Smokeless tobacco: Never Used  . Alcohol Use: No  . Drug Use: No  . Sexual Activity: No          Social History Narrative   Pt lives alone. She is HOH, especially left ear.    History  Smoking status  . Former Smoker -- 0.50 packs/day for 60 years  . Types: Cigarettes  . Quit date: 07/31/2009  Smokeless tobacco  . Never Used    History  Alcohol Use No     Allergies  Allergen Reactions  . Aspirin Anaphylaxis  . Morphine And Related Other (See Comments)    confusion    Current Outpatient Prescriptions  Medication Sig Dispense Refill  . acetaminophen (TYLENOL)  500 MG tablet Take 500 mg by mouth 2 (two) times daily as needed for pain.      Marland Kitchen albuterol (PROAIR HFA) 108 (90 BASE) MCG/ACT inhaler Inhale 2 puffs into the lungs every 6 (six) hours as needed for wheezing.      . cholecalciferol (VITAMIN D) 1000 UNITS tablet Take 1,000 Units by mouth daily.      . clopidogrel (PLAVIX) 75 MG tablet Resume M/WF next week      . furosemide (LASIX) 20 MG tablet Take 20 mg by mouth See admin instructions. Takes 2 tablets twice a week on Mon and Thurs as needed for edema.      . hydrALAZINE (APRESOLINE) 25 MG tablet Take 25 mg by mouth 2 (two) times daily.      Marland Kitchen HYDROcodone-acetaminophen (NORCO/VICODIN) 5-325 MG per tablet Take 1-2 tablets by mouth every 4 (four) hours as needed.  30 tablet  0  . HYDROcodone-homatropine (HYCODAN) 5-1.5 MG/5ML syrup Take 2.5 mLs by mouth every 6 (six) hours as needed for cough.  120 mL  0  . meclizine (ANTIVERT) 25 MG tablet Take 25 mg by mouth 3 (three) times daily as needed. For dizziness      . omeprazole (PRILOSEC) 20 MG capsule Take 20 mg by mouth daily as needed.       . pravastatin (PRAVACHOL) 20 MG tablet Take 20 mg by mouth daily at 6 PM.      . RESTASIS 0.05 % ophthalmic emulsion Place 1 drop into both eyes every 12 (twelve) hours.       . Sennosides (SENNA LAX PO) Take 1-2 tablets by mouth daily as needed. For constipation      . traMADol (ULTRAM) 50 MG tablet Take 0.5 tablets (25 mg total) by mouth 2 (two) times daily as needed for pain.  30 tablet     No current facility-administered medications for this visit.       Review of Systems:     Cardiac Review of Systems: Y or N  Chest Pain [   n ]  Resting SOB [n   ] Exertional SOB  Cove.Etienne  ]  Orthopnea [ n ]   Pedal Edema [ n  ]    Palpitations [n  ] Syncope  [ n ]   Presyncope [  y ]  General Review of Systems: [Y] = yes [  ]=no Constitional: recent weight change [ n ]; anorexia [  n]; fatigue [ y ]; nausea [n  ]; night sweats [ n ]; fever [ n ];  or chills Milo.Brash  ];                                                                                                                                           Dental: poor dentition[ n ]; Last Dentist visit:   Eye : blurred vision [  ]; diplopia [   ]; vision changes [ n ];  Amaurosis fugax[n  ]; history of 3 tis but does not remember symptoms Resp: cough [ y ];  wheezing[n  ];  hemoptysis[n  ]; shortness of breath[y  ]; paroxysmal nocturnal dyspnea[n  ]; dyspnea on exertion[  y]; or orthopnea[  ];  GI:  gallstones[  ], vomiting[ n ];  dysphagia[  ]; melena[  ];  hematochezia [ n ]; heartburn[ n ];   Hx of  Colonoscopy[  ]; GU: kidney stones [  ]; hematuria[  ];   dysuria [  ];  nocturia[  ];  history of     obstruction [  ]; urinary frequency [  ]             Skin: rash, swelling[  ];, hair loss[  ];  peripheral edema[  ];  or itching[  ]; Musculosketetal: myalgias[  ];  joint swelling[  ];  joint erythema[  ];  joint pain[  ];  back pain[  ];  Heme/Lymph: bruising[  ];  bleeding[  ];  anemia[  ];  Neuro: TIA[  ];  headaches[  ];  stroke[  ];  vertigo[  ];  seizures[ n ];   paresthesias[  ];  difficulty walking[ y ];  Psych:depression[  ]; anxiety[  ];  Endocrine: diabetes[ n ];  thyroid dysfunction[n  ];  Immunizations: Flu [ y ]; Pneumococcal[n  ];  Other:  Physical Exam: BP 156/70  Pulse 85  Resp 20  Ht 5' (1.524 m)  Wt 160 lb (72.576 kg)  BMI 31.25 kg/m2  SpO2 97%  General appearance: alert, cooperative and appears stated age Neurologic: intact Heart: regular rate and rhythm, S1, S2 normal, no murmur, click, rub or gallop Lungs: clear to auscultation bilaterally Abdomen: soft, non-tender; bowel sounds normal; no masses,  no organomegaly Extremities: extremities normal, atraumatic, no cyanosis or edema and rt aka, no pedal pulses on left, no carotid bruits Patient has no cervical supraclavicular or axillary adenopathy   Diagnostic Studies & Laboratory data:     Recent Radiology Findings:  Mr Lodema Pilot  Contrast  05/14/2013   CLINICAL DATA:  Lung cancer. Question intracranial metastatic disease. Hypertension and hyperlipidemia.  EXAM: MRI HEAD WITHOUT AND WITH CONTRAST  TECHNIQUE: Multiplanar, multiecho pulse sequences of the brain and surrounding structures were obtained according to standard protocol without and with intravenous contrast  CONTRAST:  15 cc MultiHance.  COMPARISON:  03/21/2012.  FINDINGS: No acute infarct.  No intracranial hemorrhage.  Incidentally noted is a small  developmental venous anomaly right cerebellum.  No intracranial enhancing lesion or bony destructive lesion to suggest the presence of intracranial metastatic disease.  Partial opacification left mastoid air cells without obstructing lesion noted in the posterior superior nasopharynx. Minimal to mild paranasal sinus scratch at minimal paranasal sinus mucosal thickening.  Major intracranial vascular structures are patent.  Moderate small vessel disease type changes.  Global atrophy without hydrocephalus.  Cervical spondylotic changes and spinal stenosis C3-4 and C4-5.  Orbital structures unremarkable.  IMPRESSION: No evidence of intracranial metastatic disease. Please see above.   Electronically Signed   By: Bridgett Larsson M.D.   On: 05/14/2013 12:32   Nm Pet Image Initial (pi) Skull Base To Thigh  04/30/2013   CLINICAL DATA:  Initial treatment strategy for pulmonary nodule.  EXAM: NUCLEAR MEDICINE PET SKULL BASE TO THIGH  FASTING BLOOD GLUCOSE:  Value:  95 mg/dl  TECHNIQUE: 09.8 mCi J-19 FDG was injected intravenously. CT data was obtained and used for attenuation correction and anatomic localization only. (This was not acquired as a diagnostic CT examination.) Additional exam technical data entered on technologist worksheet.  COMPARISON:  CT thorax 03/11/2013  FINDINGS: NECK  No hypermetabolic lymph nodes in the neck.  CHEST  Within the right upper lobe there is a hypermetabolic spiculated mass measuring 34 x 28 mm with SUV max =  23.5. No additional hypermetabolic nodules are identified. There is a single hypermetabolic right hilar lymph node which is difficult to define on the CT portion but felt to correlate to a 12 mm node on image 77 with SUV max = max 7.4. No discrete hypermetabolic mediastinal lymph nodes.  ABDOMEN/PELVIS  No abnormal hypermetabolic activity within the liver, pancreas, adrenal glands, or spleen. No hypermetabolic lymph nodes in the abdomen or pelvis.  SKELETON  No focal hypermetabolic activity to suggest skeletal metastasis.  IMPRESSION: Hypermetabolic right upper lobe mass without right hilar nodal metastasis. Staging by FDG PET/CT imaging T2a N1 M0 .   Electronically Signed   By: Genevive Bi M.D.   On: 04/30/2013 16:09   RADIOLOGY REPORT*  02/15/2012 Clinical Data: Shortness of breath and weakness.  CHEST - 2 VIEW  Comparison: 01/29/2009.  Findings: Trachea is midline. Heart size normal. Thoracic aorta  is calcified. There is added density in the apex of the right  hemithorax, in the region of the right first costochondral  junction, more prominent than on 01/29/2009. Minimal scarring at  the left lung base. Lungs are otherwise clear. No pleural fluid.  Degenerative changes are seen in the mid thoracic spine.  IMPRESSION:  Added density in the region of the right first costochondral  junction, increased in prominence from 01/29/2009. In this patient  with a smoking history, non emergent of CT chest without contrast  would be helpful in further evaluation, as clinically indicated, as  a pulmonary nodule cannot be excluded. These results will be called  to the ordering clinician or representative by the Radiologist  Assistant, and communication documented in the PACS Dashboard.  Original Report Authenticated By: Reyes Ivan, M.D.   RADIOLOGY REPORT*  Clinical Data: Cough and shortness of breath. Known apical mass.  CHEST - 2 VIEW  Comparison: 02/15/2012.  Findings: The cardiac  silhouette, mediastinal and hilar contours  are normal and stable. Mild tortuosity and calcification of the  thoracic aorta. The pulmonary hila appear normal. There is a  right apical lung lesion worrisome for neoplasm and definitely  progressive since prior chest x-ray. No infiltrates or effusions.  The bony thorax is intact.  IMPRESSION:  Right apical lesion. Recommend chest CT for further evaluation.  No acute infiltrates or effusions.  Original Report Authenticated By: Rudie Meyer, M.D.      RADIOLOGY REPORT*  Clinical Data: Enlarging right apical mass on recent chest  radiographs. Cough with night sweats and hoarseness.  CT CHEST WITH CONTRAST  Technique: Multidetector CT imaging of the chest was performed  following the standard protocol during bolus administration of  intravenous contrast.  Contrast: OMNIPAQUE IOHEXOL 300 MG/ML SOLN  Comparison: Chest radiographs 03/05/2013 and 02/15/2012.  Findings: Corresponding with the radiographic finding is a  spiculated right apical mass measuring 3.6 x 2.9 x 2.0 cm  consistent with bronchogenic carcinoma. This lesion demonstrates  no chest wall involvement. No other suspicious pulmonary nodules  demonstrated. There is mild centrilobular emphysema.  Small AP window and right hilar lymph nodes are not pathologically  enlarged. There is no pleural or pericardial effusion.  There is mild atherosclerosis of the aorta, great vessels and  coronary arteries. No suspicious findings are present within the  upper abdomen. There are small calcified splenic granulomas.  There is mild reflux of contrasted blood into the IVC and hepatic  veins. There are no worrisome osseous findings.  IMPRESSION:  1. 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 Tissue sampling recommended.  2. No evidence of metastatic disease.  3. Mild centrilobular emphysema.  Original Report Authenticated By: Carey Bullocks, M.D.   Recent  Lab Findings: Lab Results  Component Value Date   WBC 7.0 04/10/2013   HGB 13.3 04/10/2013   HCT 40.9 04/10/2013   PLT 238 04/10/2013   GLUCOSE 108* 04/10/2013   ALT 10 04/10/2013   AST 15 04/10/2013   NA 137 04/10/2013   K 4.1 04/10/2013   CL 105 04/10/2013   CREATININE 0.86 04/10/2013   BUN 13 04/10/2013   CO2 25 04/10/2013   INR 1.14 04/10/2013   PFT's ;Attempted DLCO but unable to do, FEV1 1.1 82% - 6% increase with bronchiodilators Walked  in office more then 5 min- became SOB but with  O2 sat 98% on RA  Assessment / Plan:   Clinicial cT2a,cN1,cM0 Carcinoma of  the right Upper Lung Limitied mobility secondary to rt leg ambulation and peripherial vascular disease Mild COPD, unable to do DLCO MRI of the brain shows no evidence of metastatic disease Patient case was presented at the multidisciplinary thoracic oncology conference today, treatment options of high risk surgical resection versus biopsy with needle or ENB were discussed with chemo radiation were discussed. The options were again discussed with the patient and her grandaughter.  Have spent more than an 45 min face-to-face with the patient and her granddaughter reviewing the options. She will discuss with her family over the weekend and call back on Monday. She is on plavix so resection or bx would have to be delayed for plavix washout.  Delight Ovens MD   [address] [place] [place] [address] Office [phone]   Beeper [phone]  05/15/2013 5:59 PM

## 2013-05-15 NOTE — Telephone Encounter (Signed)
I spoke with pt. She reports she wants to speak with MW regarding her appt with Dr. Tyrone Sage today. She stated she would like to discuss some things. Please advise MW thanks

## 2013-05-16 ENCOUNTER — Telehealth: Payer: Self-pay | Admitting: Internal Medicine

## 2013-05-16 DIAGNOSIS — R918 Other nonspecific abnormal finding of lung field: Secondary | ICD-10-CM

## 2013-05-16 NOTE — Telephone Encounter (Signed)
Why don't we send her to [facility] but explain usually that's only done after the diagnostic procedure because can't give a prognosis or treatment plan s a tissue dx

## 2013-05-16 NOTE — Telephone Encounter (Signed)
I spoke with pt grand daughter. She stated pt is requesting to have a second opinion regarding options. They are requesting a referral to see someone at the [facility] cancer center. Please advise Dr. Sherene Sires thanks

## 2013-05-19 NOTE — Telephone Encounter (Signed)
Returning call.

## 2013-05-19 NOTE — Telephone Encounter (Signed)
[email or web address] x1 

## 2013-05-19 NOTE — Telephone Encounter (Signed)
Called, spoke with pt's grandaugter, [name].  Informed her of below per MW.  She verbalized understanding of this and is ok with proceeding with [facility] referral.  I have placed order for this.  [name] aware she will receive another call regarding appt date and time. She voiced no further questions or concerns at this time.

## 2013-05-20 ENCOUNTER — Telehealth: Payer: Self-pay | Admitting: *Deleted

## 2013-05-20 ENCOUNTER — Other Ambulatory Visit: Payer: Self-pay

## 2013-05-20 ENCOUNTER — Telehealth: Payer: Self-pay | Admitting: Internal Medicine

## 2013-05-20 DIAGNOSIS — D381 Neoplasm of uncertain behavior of trachea, bronchus and lung: Secondary | ICD-10-CM

## 2013-05-20 NOTE — Telephone Encounter (Signed)
Looks like this was sent over to [facility] yesterday. Please advise PCC's thanks

## 2013-05-20 NOTE — Telephone Encounter (Signed)
Spoke with pt today. I explained that Dr. Arbutus Ped needs tissue dx before she could be treated.  I further explained the process of dx lung mass.  She verbalized understanding and will call [facility] today to be set up for tissue dx.

## 2013-05-20 NOTE — Telephone Encounter (Signed)
Referral was sent on 05/19/13. We are currently waiting on this appointment. I have reached out to [facility] and waiting on her reply. Rhonda J Cobb

## 2013-05-21 NOTE — Telephone Encounter (Signed)
Referral was sent to [facility] and after speaking with Dr. Dennie Maizes office, biopsy has been scheduled for Monday 05/26/13 at 9:00 at [facility]. Pt decided to move forward with biopsy instead of obtaining second opinion.  Called and spoke with patient and she is aware of this appointment. She stated to" thank Dr. Sherene Sires for everything he has done for her, that she appreciates [place] all". Advised patient that if she needed [place] to call us.  [name]

## 2013-05-21 NOTE — Telephone Encounter (Signed)
Will forward to MW as an FYI 

## 2013-05-22 ENCOUNTER — Encounter (HOSPITAL_COMMUNITY): Payer: Self-pay | Admitting: Pharmacy Technician

## 2013-05-22 ENCOUNTER — Other Ambulatory Visit: Payer: Self-pay | Admitting: Radiology

## 2013-05-23 ENCOUNTER — Telehealth: Payer: Self-pay

## 2013-05-23 ENCOUNTER — Telehealth: Payer: Self-pay | Admitting: Internal Medicine

## 2013-05-23 NOTE — Telephone Encounter (Signed)
Will forward to MW so that he can be aware of this.

## 2013-05-26 ENCOUNTER — Telehealth: Payer: Self-pay | Admitting: *Deleted

## 2013-05-26 ENCOUNTER — Ambulatory Visit (HOSPITAL_COMMUNITY): Admission: RE | Admit: 2013-05-26 | Payer: Medicare Other | Source: Ambulatory Visit

## 2013-05-28 ENCOUNTER — Other Ambulatory Visit: Payer: Self-pay | Admitting: Radiology

## 2013-05-29 ENCOUNTER — Encounter (HOSPITAL_COMMUNITY): Payer: Self-pay

## 2013-05-29 ENCOUNTER — Ambulatory Visit (HOSPITAL_COMMUNITY)
Admission: RE | Admit: 2013-05-29 | Discharge: 2013-05-29 | Disposition: A | Discharge status: Home / Self Care | Payer: Medicare Other | Source: Ambulatory Visit | Attending: Interventional Radiology | Admitting: Interventional Radiology

## 2013-05-29 ENCOUNTER — Ambulatory Visit (HOSPITAL_COMMUNITY)
Admission: RE | Admit: 2013-05-29 | Discharge: 2013-05-29 | Disposition: A | Discharge status: Home / Self Care | Payer: Medicare Other | Source: Ambulatory Visit | Attending: Cardiothoracic Surgery | Admitting: Cardiothoracic Surgery

## 2013-05-29 DIAGNOSIS — J449 Chronic obstructive pulmonary disease, unspecified: Secondary | ICD-10-CM | POA: Diagnosis not present

## 2013-05-29 DIAGNOSIS — R222 Localized swelling, mass and lump, trunk: Secondary | ICD-10-CM | POA: Diagnosis present

## 2013-05-29 DIAGNOSIS — J4489 Other specified chronic obstructive pulmonary disease: Secondary | ICD-10-CM | POA: Insufficient documentation

## 2013-05-29 DIAGNOSIS — E785 Hyperlipidemia, unspecified: Secondary | ICD-10-CM | POA: Insufficient documentation

## 2013-05-29 DIAGNOSIS — I739 Peripheral vascular disease, unspecified: Secondary | ICD-10-CM | POA: Insufficient documentation

## 2013-05-29 DIAGNOSIS — Z8673 Personal history of transient ischemic attack (TIA), and cerebral infarction without residual deficits: Secondary | ICD-10-CM | POA: Insufficient documentation

## 2013-05-29 DIAGNOSIS — I10 Essential (primary) hypertension: Secondary | ICD-10-CM | POA: Diagnosis not present

## 2013-05-29 DIAGNOSIS — Z87891 Personal history of nicotine dependence: Secondary | ICD-10-CM | POA: Diagnosis not present

## 2013-05-29 DIAGNOSIS — C3410 Malignant neoplasm of upper lobe, unspecified bronchus or lung: Secondary | ICD-10-CM | POA: Insufficient documentation

## 2013-05-29 DIAGNOSIS — D381 Neoplasm of uncertain behavior of trachea, bronchus and lung: Secondary | ICD-10-CM

## 2013-05-29 LAB — CBC
HCT: 38.5 % (ref 36.0–46.0)
Hemoglobin: 13 g/dL (ref 12.0–15.0)
MCH: 30 pg (ref 26.0–34.0)
MCHC: 33.8 g/dL (ref 30.0–36.0)
MCV: 88.9 fL (ref 78.0–100.0)
Platelets: 290 10*3/uL (ref 150–400)
RBC: 4.33 MIL/uL (ref 3.87–5.11)
RDW: 13.3 % (ref 11.5–15.5)
WBC: 5.3 10*3/uL (ref 4.0–10.5)

## 2013-05-29 LAB — PROTIME-INR
INR: 1.11 (ref 0.00–1.49)
Prothrombin Time: 14.1 seconds (ref 11.6–15.2)

## 2013-05-29 LAB — APTT: aPTT: 31 seconds (ref 24–37)

## 2013-05-29 MED ORDER — SODIUM CHLORIDE 0.9 % IV SOLN: Freq: Once | INTRAVENOUS | Status: DC

## 2013-05-29 MED ORDER — MIDAZOLAM HCL 2 MG/2ML IJ SOLN
INTRAMUSCULAR | Status: AC
  Filled 2013-05-29: qty 4

## 2013-05-29 MED ORDER — LIDOCAINE HCL 1 % IJ SOLN
INTRAMUSCULAR | Status: AC
  Filled 2013-05-29: qty 10

## 2013-05-29 MED ORDER — MIDAZOLAM HCL 2 MG/2ML IJ SOLN
INTRAMUSCULAR | Status: AC | PRN
  Administered 2013-05-29 (×2): 1 mg via INTRAVENOUS

## 2013-05-29 MED ORDER — FENTANYL CITRATE 0.05 MG/ML IJ SOLN
INTRAMUSCULAR | Status: AC
  Filled 2013-05-29: qty 4

## 2013-05-29 MED ORDER — FENTANYL CITRATE 0.05 MG/ML IJ SOLN
INTRAMUSCULAR | Status: AC | PRN
  Administered 2013-05-29 (×3): 25 ug via INTRAVENOUS

## 2013-05-29 NOTE — H&P (Signed)
Chief Complaint: "I am here for a right lung biopsy." Referring Physician: Dr. Tyrone Sage HPI: [name] is an 79 y.o. female with PMHx of > 50 year tobacco use, quit 2011. The patient also has a history of PVD with stents and is on plavix which she states she has stopped 6 days ago. She denies any active bleeding, blood in her stool or urine. She has RLE AKA secondary to gangrene. She denies any recent illness, fever or chills. She does admit to night sweats which have been going on for awhile with no recent change. She denies any weight loss or decrease in appetite. She denies any chest pain or change in chronic shortness of breath. She had CXR 01/2012 which revealed prominent density in upper lobe of the right lung compared to 01/2009 CXR. Follow-up CT 02/2013 revealed spiculated right apical mass consistent with bronchogenic carcinoma. The patient underwent bronchoscopy 04/10/13 and pathology showed no malignant cells. PET performed 04/2013 showed hypermetabolic activity RUL, patient is here today for requested and scheduled image guided biopsy of upper lobe mass of right lung.   Past Medical History:  Past Medical History  Diagnosis Date  . Stroke   . Arthritis   . Hypertension   . Hyperlipidemia   . Glaucoma   . Peripheral vascular disease, unspecified     with Claudication  . COPD GOLD I 04/18/2013    Past Surgical History:  Past Surgical History  Procedure Laterality Date  . Above knee leg amputation      Right  . Angioplasty / stenting iliac  03/22/09    Aortogram-  left common iliac artery  . Hemorrhoid surgery    . Tonsillectomy    . Colonoscopy  Feb. 2013  . Video bronchoscopy Bilateral 04/10/2013    Procedure: VIDEO BRONCHOSCOPY WITH FLUORO;  Surgeon: Nyoka Cowden, MD;  Location: [facility];  Service: Cardiopulmonary;  Laterality: Bilateral;    Family History:  Family History  Problem Relation Age of Onset  . Stroke Brother   . Stomach cancer Mother   . Asthma Sister    . Colon cancer Brother     Social History:  reports that she quit smoking about 3 years ago. Her smoking use included Cigarettes. She has a 30 pack-year smoking history. She has never used smokeless tobacco. She reports that she does not drink alcohol or use illicit drugs.  Allergies:  Allergies  Allergen Reactions  . Aspirin Anaphylaxis  . Morphine And Related Other (See Comments)    confusion      Medication List    ASK your doctor about these medications       acetaminophen 500 MG tablet  Commonly known as:  TYLENOL  Take 500 mg by mouth 2 (two) times daily as needed for pain.     cholecalciferol 1000 UNITS tablet  Commonly known as:  VITAMIN D  Take 1,000 Units by mouth daily.     clopidogrel 75 MG tablet  Commonly known as:  PLAVIX  Take 75 mg by mouth 3 (three) times a week. Monday, Wednesday, friday     furosemide 20 MG tablet  Commonly known as:  LASIX  Take 20 mg by mouth 2 (two) times a week. 1 tablet twice a week on Mon and Thurs as needed for edema.     hydrALAZINE 25 MG tablet  Commonly known as:  APRESOLINE  Take 25 mg by mouth 2 (two) times daily.     meclizine 25 MG tablet  Commonly  known as:  ANTIVERT  Take 25 mg by mouth 3 (three) times daily as needed. For dizziness     omeprazole 20 MG capsule  Commonly known as:  PRILOSEC  Take 20 mg by mouth daily before breakfast.     pravastatin 20 MG tablet  Commonly known as:  PRAVACHOL  Take 20 mg by mouth at bedtime.     PROAIR HFA 108 (90 BASE) MCG/ACT inhaler  Generic drug:  albuterol  Inhale 2 puffs into the lungs every 6 (six) hours as needed for wheezing.     RESTASIS 0.05 % ophthalmic emulsion  Generic drug:  cycloSPORINE  Place 1 drop into both eyes every 12 (twelve) hours.     SENNA LAX PO  Take 1-2 tablets by mouth daily as needed. For constipation     traMADol 50 MG tablet  Commonly known as:  ULTRAM  Take 0.5 tablets (25 mg total) by mouth 2 (two) times daily as needed for pain.        Please HPI for pertinent positives, otherwise complete 10 system ROS negative.  Physical Exam: BP 169/78  Pulse 62  Temp(Src) 98.1 F (36.7 C) (Oral)  Resp 18  Ht 5' (1.524 m)  Wt 163 lb (73.936 kg)  BMI 31.83 kg/m2  SpO2 99% Body mass index is 31.83 kg/(m^2).  General Appearance:  Alert, cooperative, no distress  Head:  Normocephalic, without obvious abnormality, atraumatic  Neck: Supple, symmetrical, trachea midline  Lungs:   Clear to auscultation bilaterally, no w/r/r, respirations unlabored without use of accessory muscles.  Chest Wall:  No tenderness or deformity  Heart:  Regular rate and rhythm, S1, S2 normal, no murmur, rub or gallop.  Abdomen:   Soft, non-tender, non distended, (+) BS  Extremities: RLE AKA amputation with prosthesis, atraumatic, no cyanosis or edema  Pulses: LLE 1+ and symmetric  Neurologic: Normal affect, no gross deficits.   Results for orders placed during the hospital encounter of 05/29/13 (from the past 48 hour(s))  APTT     Status: None   Collection Time    05/29/13 10:07 AM      Result Value Range   aPTT 31  24 - 37 seconds  CBC     Status: None   Collection Time    05/29/13 10:07 AM      Result Value Range   WBC 5.3  4.0 - 10.5 K/uL   RBC 4.33  3.87 - 5.11 MIL/uL   Hemoglobin 13.0  12.0 - 15.0 g/dL   HCT 11.9  14.7 - 82.9 %   MCV 88.9  78.0 - 100.0 fL   MCH 30.0  26.0 - 34.0 pg   MCHC 33.8  30.0 - 36.0 g/dL   RDW 56.2  13.0 - 86.5 %   Platelets 290  150 - 400 K/uL  PROTIME-INR     Status: None   Collection Time    05/29/13 10:07 AM      Result Value Range   Prothrombin Time 14.1  11.6 - 15.2 seconds   INR 1.11  0.00 - 1.49   No results found.  Assessment/Plan Right upper lobe lung mass (+) PET 04/2013 S/p bronchoscopy 04/10/13 with no malignant cells seen on pathology. History of > 50 years tobacco use, quit 2011. Request for image guided right upper lobe lung mass biopsy. Patient has held plavix 6 days, NPO and labs  reviewed. Risks and Benefits discussed with the patient including but not limited to infection, bleeding, collapse of the  lung or even death. All of the patient's questions were answered, patient is agreeable to proceed. Consent signed and in chart.   Pattricia Boss D PA-C 05/29/2013, 10:41 AM

## 2013-05-29 NOTE — Progress Notes (Signed)
Voided 300cc of yellow urine 

## 2013-05-29 NOTE — Procedures (Signed)
Successful RUL MASS 18 G CORE BXS NO COMP STABLE FULL REPORT IN PACS

## 2013-05-29 NOTE — Progress Notes (Signed)
Discharge instruction given to Pt and cg .  Pt and CG able to verbalize understanding of instructons.  Pt able to tolerate fluid and food intake.   Pt to car via wheelchair.

## 2013-06-03 ENCOUNTER — Telehealth: Payer: Self-pay | Admitting: *Deleted

## 2013-06-03 NOTE — Telephone Encounter (Signed)
Tried to call with appt for mtoc this week. Phone line busy will call back

## 2013-06-03 NOTE — Telephone Encounter (Signed)
Spoke with pt regarding appt for mtoc 06/05/13 at 1:00.  She verbalized understanding of time and place of appt

## 2013-06-05 ENCOUNTER — Ambulatory Visit (HOSPITAL_BASED_OUTPATIENT_CLINIC_OR_DEPARTMENT_OTHER): Payer: Medicare Other | Admitting: Internal Medicine

## 2013-06-05 ENCOUNTER — Ambulatory Visit
Admission: RE | Admit: 2013-06-05 | Discharge: 2013-06-05 | Disposition: A | Discharge status: Home / Self Care | Payer: Medicare Other | Source: Ambulatory Visit | Attending: Radiation Oncology | Admitting: Radiation Oncology

## 2013-06-05 ENCOUNTER — Ambulatory Visit: Payer: Medicare Other | Attending: Internal Medicine | Admitting: Physical Therapy

## 2013-06-05 ENCOUNTER — Encounter: Payer: Self-pay | Admitting: Internal Medicine

## 2013-06-05 ENCOUNTER — Encounter: Payer: Self-pay | Admitting: *Deleted

## 2013-06-05 DIAGNOSIS — R269 Unspecified abnormalities of gait and mobility: Secondary | ICD-10-CM | POA: Insufficient documentation

## 2013-06-05 DIAGNOSIS — C3410 Malignant neoplasm of upper lobe, unspecified bronchus or lung: Secondary | ICD-10-CM

## 2013-06-05 DIAGNOSIS — C3490 Malignant neoplasm of unspecified part of unspecified bronchus or lung: Secondary | ICD-10-CM | POA: Insufficient documentation

## 2013-06-05 DIAGNOSIS — M25519 Pain in unspecified shoulder: Secondary | ICD-10-CM | POA: Insufficient documentation

## 2013-06-05 DIAGNOSIS — IMO0001 Reserved for inherently not codable concepts without codable children: Secondary | ICD-10-CM | POA: Insufficient documentation

## 2013-06-05 NOTE — Progress Notes (Signed)
[facility] [phone]   [phone] Multidisciplinary thoracic oncology clinic (MTOC)  CONSULT NOTE  REFERRING PHYSICIAN: Dr. Sheliah Plane  REASON FOR CONSULTATION:  79 years old African American female recently diagnosed with lung cancer.  HPI STEELE [name] is a 79 y.o. female with a past medical history significant for multiple medical problems including history of many TIA, hypertension, dyslipidemia, COPD, peripheral vascular disease status post angioplasty, arthritis as well as long history of smoking but quit in 2011. The patient mentions that in July of 2013, she was complaining of right shoulder pain and chest x-ray performed on 02/15/2012 and it showed density in the apex of the right hemithorax. This was followed by observation and repeat chest x-ray on 03/05/2013 showed right apical lesion.  CT scan of the chest on 03/11/2013 showed a spiculated right apical mass measuring 3.6 x 2.9 x 2.0 cm consistent with bronchogenic carcinoma. This lesion demonstrates no chest wall involvement. No other suspicious pulmonary nodules demonstrated. There is mild centrilobular emphysema. Small AP window and right hilar lymph nodes are not pathologically enlarged. There is no pleural or pericardial effusion. The patient was referred to Dr. Tyrone Sage and a PET scan was performed on 10/02/2014and it showed hypermetabolic right upper lobe mass with right hilar nodal metastasis.  MRI of the brain on 05/14/2013 was negative for brain metastasis. On 05/29/2013 the patient underwent CT-guided core biopsy of the right upper lobe lung mass. The final pathology (Accession: [id number]) showed adenocarcinoma. Sections of the needle core biopsies show an invasive non-small cell carcinoma with features of adenocarcinoma. Some of the tumor cells display clear cytoplasmic features. Confirmatory immunohistochemical stains were performed including Cytokeratin 7, Cytokeratin 20,  TTF-1, Napsin A and CDX-2 with appropriate controls. The tumor cells stain with Cytokeratin 7, Napsin A and TTF-1. The tumor cells are negative for CDX-2 and mostly negative for Cytokeratin 20. The features are compatible with adenocarcinoma of lung primary. Dr. Tyrone Sage recommended surgical resection for the patient but she preferred to talk to a medical oncologist before proceeding with surgery. He kindly referred her to me today for evaluation and discussion of her treatment options. The patient is feeling fine except for anxiety about her recent diagnosis she also continues to complain of bilateral shoulder pain and shortness breath with exertion but no cough or hemoptysis. She lost around 12 pounds over the last 4 months. She has no nausea or vomiting. The patient denied having any headache or blurry vision. Her family history significant for a mother diagnosed with stomach cancer, brother with colon cancer and another brother with stroke. The patient is a widow and has one living child. She is currently retired and used to do a Statistician. She has a history of smoking for around 60 years but quit in 2011 and no history of alcohol or drug abuse. HPI  Past Medical History  Diagnosis Date  . Stroke   . Arthritis   . Hypertension   . Hyperlipidemia   . Glaucoma   . Peripheral vascular disease, unspecified     with Claudication  . COPD GOLD I 04/18/2013    Past Surgical History  Procedure Laterality Date  . Above knee leg amputation      Right  . Angioplasty / stenting iliac  03/22/09    Aortogram-  left common iliac artery  . Hemorrhoid surgery    . Tonsillectomy    . Colonoscopy  Feb. 2013  . Video bronchoscopy Bilateral 04/10/2013  Procedure: VIDEO BRONCHOSCOPY WITH FLUORO;  Surgeon: Nyoka Cowden, MD;  Location: [facility];  Service: Cardiopulmonary;  Laterality: Bilateral;    Family History  Problem Relation Age of Onset  . Stroke Brother   . Stomach cancer Mother    . Asthma Sister   . Colon cancer Brother     Social History History  Substance Use Topics  . Smoking status: Former Smoker -- 0.50 packs/day for 60 years    Types: Cigarettes    Quit date: 07/31/2009  . Smokeless tobacco: Never Used  . Alcohol Use: No    Allergies  Allergen Reactions  . Aspirin Anaphylaxis  . Morphine And Related Other (See Comments)    confusion    Current Outpatient Prescriptions  Medication Sig Dispense Refill  . acetaminophen (TYLENOL) 500 MG tablet Take 500 mg by mouth 2 (two) times daily as needed for pain.      Marland Kitchen albuterol (PROAIR HFA) 108 (90 BASE) MCG/ACT inhaler Inhale 2 puffs into the lungs every 6 (six) hours as needed for wheezing.      . cholecalciferol (VITAMIN D) 1000 UNITS tablet Take 1,000 Units by mouth daily.      . clopidogrel (PLAVIX) 75 MG tablet Take 75 mg by mouth 3 (three) times a week. Monday, Wednesday, friday      . furosemide (LASIX) 20 MG tablet Take 20 mg by mouth 2 (two) times a week. 1 tablet twice a week on Mon and Thurs as needed for edema.      . hydrALAZINE (APRESOLINE) 25 MG tablet Take 25 mg by mouth 2 (two) times daily.      . meclizine (ANTIVERT) 25 MG tablet Take 25 mg by mouth 3 (three) times daily as needed. For dizziness      . omeprazole (PRILOSEC) 20 MG capsule Take 20 mg by mouth daily before breakfast.       . pravastatin (PRAVACHOL) 20 MG tablet Take 20 mg by mouth at bedtime.       . RESTASIS 0.05 % ophthalmic emulsion Place 1 drop into both eyes every 12 (twelve) hours.       . Sennosides (SENNA LAX PO) Take 1-2 tablets by mouth daily as needed. For constipation      . traMADol (ULTRAM) 50 MG tablet Take 0.5 tablets (25 mg total) by mouth 2 (two) times daily as needed for pain.  30 tablet     No current facility-administered medications for this visit.    Review of Systems  Constitutional: negative Eyes: negative Ears, nose, mouth, throat, and face: negative Respiratory: positive for dyspnea on  exertion Cardiovascular: negative Gastrointestinal: negative Genitourinary:negative Integument/breast: negative Hematologic/lymphatic: negative Musculoskeletal:positive for arthralgias Neurological: negative Behavioral/Psych: positive for anxiety Endocrine: negative Allergic/Immunologic: negative  Physical Exam  ZOX:WRUEA, healthy, no distress, well nourished, well developed and anxious SKIN: skin color, texture, turgor are normal, no rashes or significant lesions HEAD: Normocephalic, No masses, lesions, tenderness or abnormalities EYES: normal, PERRLA EARS: External ears normal OROPHARYNX:no exudate, no erythema and lips, buccal mucosa, and tongue normal  NECK: supple, no adenopathy, no JVD LYMPH:  no palpable lymphadenopathy, no hepatosplenomegaly BREAST:not examined LUNGS: clear to auscultation , and palpation HEART: regular rate & rhythm, no murmurs and no gallops ABDOMEN:abdomen soft, non-tender, normal bowel sounds and no masses or organomegaly BACK: Back symmetric, no curvature., No CVA tenderness EXTREMITIES:no joint deformities, effusion, or inflammation, no edema, no skin discoloration  NEURO: alert & oriented x 3 with fluent speech, no focal motor/sensory deficits  PERFORMANCE STATUS: ECOG 1  LABORATORY DATA: Lab Results  Component Value Date   WBC 5.3 05/29/2013   HGB 13.0 05/29/2013   HCT 38.5 05/29/2013   MCV 88.9 05/29/2013   PLT 290 05/29/2013      Chemistry      Component Value Date/Time   NA 137 04/10/2013 1200   K 4.1 04/10/2013 1200   CL 105 04/10/2013 1200   CO2 25 04/10/2013 1200   BUN 13 04/10/2013 1200   CREATININE 0.86 04/10/2013 1200      Component Value Date/Time   CALCIUM 8.9 04/10/2013 1200   ALKPHOS 54 04/10/2013 1200   AST 15 04/10/2013 1200   ALT 10 04/10/2013 1200   BILITOT 0.2* 04/10/2013 1200       RADIOGRAPHIC STUDIES: Dg Chest 1 View  05/29/2013   CLINICAL DATA:  History of right upper lobe biopsy.  EXAM: CHEST - 1 VIEW   COMPARISON:  CHEST x-ray 04/11/2013.  FINDINGS: Previously noted right upper lobe mass in his similar in appearance. No definite evidence of significant right-sided pneumothorax. No acute consolidative airspace disease. No pleural effusions. Pulmonary vasculature is normal. Mild cardiomegaly. Upper mediastinal contours are unremarkable. Atherosclerosis in the thoracic aorta.  IMPRESSION: 1. No evidence for post procedural pneumothorax in this patient status post right upper lobe biopsy. 2. The appearance the chest is similar to prior studies, as above.   Electronically Signed   By: Trudie Reed M.D.   On: 05/29/2013 14:12   Mr Laqueta Jean ZO Contrast  05/14/2013   CLINICAL DATA:  Lung cancer. Question intracranial metastatic disease. Hypertension and hyperlipidemia.  EXAM: MRI HEAD WITHOUT AND WITH CONTRAST  TECHNIQUE: Multiplanar, multiecho pulse sequences of the brain and surrounding structures were obtained according to standard protocol without and with intravenous contrast  CONTRAST:  15 cc MultiHance.  COMPARISON:  03/21/2012.  FINDINGS: No acute infarct.  No intracranial hemorrhage.  Incidentally noted is a small developmental venous anomaly right cerebellum.  No intracranial enhancing lesion or bony destructive lesion to suggest the presence of intracranial metastatic disease.  Partial opacification left mastoid air cells without obstructing lesion noted in the posterior superior nasopharynx. Minimal to mild paranasal sinus scratch at minimal paranasal sinus mucosal thickening.  Major intracranial vascular structures are patent.  Moderate small vessel disease type changes.  Global atrophy without hydrocephalus.  Cervical spondylotic changes and spinal stenosis C3-4 and C4-5.  Orbital structures unremarkable.  IMPRESSION: No evidence of intracranial metastatic disease. Please see above.   Electronically Signed   By: Bridgett Larsson M.D.   On: 05/14/2013 12:32   Ct Biopsy  05/29/2013   CLINICAL DATA:  3.4  CM RIGHT UPPER LOBE SPICULATED MASS  EXAM: CT GUIDED NEEDLE CORE BIOPSY OF RIGHT UPPER LOBE MASS  ANESTHESIA/SEDATION: 2  mg IV Versed; 75 mcg IV Fentanyl  Total Moderate Sedation Time: 15 minutes.  PROCEDURE: The procedure risks, benefits, and alternatives were explained to the patient. Questions regarding the procedure were encouraged and answered. The patient understands and consents to the procedure.  The right anterior chest was prepped with betadinein a sterile fashion, and a sterile drape was applied covering the operative field. A sterile gown and sterile gloves were used for the procedure. Local anesthesia was provided with 1% Lidocaine.  Patient was positioned supine. Noncontrast localization CT performed. Right upper lobe anterior mass was localized. Under sterile conditions and local anesthesia, CT guidance was utilized to advance a 17 gauge 11.8 cm access needle from an anterior approach.  Needle position confirmed within the lesion by CT. 18 gauge core biopsies obtained and placed in formalin. No immediate complication. Needle tract was embolized with a blood patch. No effusion or pneumothorax following needle removal. Patient tolerated the biopsy well.  Complications: None immediate  FINDINGS: CT imaging confirms needle placed in in the anterior right upper lobe mass.  IMPRESSION: Successful CT-guided right upper lobe mass 18 gauge core biopsy   Electronically Signed   By: Ruel Favors M.D.   On: 05/29/2013 13:28    ASSESSMENT: This is a very pleasant 79 years old African American female recently diagnosed with a stage IIA (T2a., N1, M0) non-small cell lung cancer, adenocarcinoma diagnosed in October of 2014.   PLAN:The patient is doing fine today. I had a lengthy discussion with the patient and her caregiver about her current disease stage, prognosis and treatment options. I explained to the patient that surgical resection is probably the best option for her at this point if she is a candidate  for resection. I recommended for her to see Dr. Tyrone Sage again for evaluation and consideration of surgical resection of her tumor. If the patient is not a surgical candidate for resection, we may consider her for a course of concurrent chemoradiation. The patient had several questions today and I answered them completely to her satisfaction. Based on her final staging after resection, the patient may benefit from adjuvant chemotherapy and I would be happy to see her again after her surgical resection for discussion of this option.  I did not give her a follow up appointment but I will monitor her case closely and will get her to come back to the cancer center few weeks after her surgery The patient was advised to call immediately if she has any concerning symptoms in the interval. The patient voices understanding of current disease status and treatment options and is in agreement with the current care plan.  All questions were answered. The patient knows to call the clinic with any problems, questions or concerns. We can certainly see the patient much sooner if necessary.  Thank you so much for allowing me to participate in the care of [name]. I will continue to follow up the patient with you and assist in her care.  I spent 40 minutes counseling the patient face to face. The total time spent in the appointment was 60 minutes.  Raya Mckinstry K. 06/05/2013, 2:00 PM

## 2013-06-06 ENCOUNTER — Encounter: Payer: Self-pay | Admitting: *Deleted

## 2013-06-06 NOTE — Progress Notes (Signed)
Gas card application sent today to [facility]

## 2013-06-06 NOTE — Progress Notes (Signed)
Faxed [facility] to pathology dept

## 2013-06-07 DIAGNOSIS — C3490 Malignant neoplasm of unspecified part of unspecified bronchus or lung: Secondary | ICD-10-CM

## 2013-06-07 DIAGNOSIS — C3432 Malignant neoplasm of lower lobe, left bronchus or lung: Secondary | ICD-10-CM | POA: Insufficient documentation

## 2013-06-07 HISTORY — DX: Malignant neoplasm of unspecified part of unspecified bronchus or lung: C34.90

## 2013-06-07 NOTE — Patient Instructions (Signed)
You are recently diagnosed with a stage IIb non-small cell lung cancer. I recommended for you surgical resection and follow up visit with Dr. Tyrone Sage

## 2013-06-09 ENCOUNTER — Encounter: Payer: Self-pay | Admitting: Cardiothoracic Surgery

## 2013-06-09 ENCOUNTER — Ambulatory Visit (INDEPENDENT_AMBULATORY_CARE_PROVIDER_SITE_OTHER): Payer: Medicare Other | Admitting: Cardiothoracic Surgery

## 2013-06-09 ENCOUNTER — Other Ambulatory Visit: Payer: Self-pay | Admitting: *Deleted

## 2013-06-09 VITALS — BP 156/76 | HR 104 | Resp 20 | Ht 60.0 in | Wt 159.0 lb

## 2013-06-09 DIAGNOSIS — R918 Other nonspecific abnormal finding of lung field: Secondary | ICD-10-CM

## 2013-06-09 DIAGNOSIS — R222 Localized swelling, mass and lump, trunk: Secondary | ICD-10-CM

## 2013-06-09 NOTE — Progress Notes (Signed)
[name] [facility] Medical Record #[id number] Date of Birth: 02-08-1928  Referring: Nyoka Cowden, MD Primary Care: Florentina Jenny, MD  Chief Complaint:    Chief Complaint  Patient presents with  . Lung Mass    Surgical eval on Lung mass, Chest CT 03/11/13, PET scan 04/30/13, Path 04/10/13, PFT's 05/13/13    History of Present Illness:    Patient is an 79 year old female referred by Dr. Sherene Sires for a 3.5 cm right upper lobe lung mass highly suspicious for carcinoma the lung. The patient had a chest x-ray in July of 2013 which commented on possible right upper lung lesion. Subsequent chest x-ray almost a year later showed this area to be more prominent. A CT scan of the chest was performed leading to a PET scan and bronchoscopy. The patient developed shortness of breath and pulmonary bleed following bronchoscopy and was hospitalized for several days ultimately discharged home. The path was negative. She is now referred to thoracic surgery office for evaluation of treatment options.  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 The patient's granddaughter notes that she does get short of breath with exertion but is able to walk around the grocery store with assistance. In larger stores she usually rides a cart. The patient has been a long-term smoker more than 50 years and quit in 2011.     Current Activity/ Functional Status:  Patient is independent with mobility/ambulation, transfers, ADL's, IADL's.  Zubrod Score: At the time of surgery this patient's most appropriate activity status/level should be described as: []  Normal activity, no symptoms []  Symptoms, fully ambulatory [x]  Symptoms, in bed less than or equal to 50% of the time []  Symptoms, in bed greater than 50% of the time  but less than 100% []  Bedridden []  Moribund   Past Medical History  Diagnosis Date  . Stroke   . Arthritis   . Hypertension   . Hyperlipidemia   . Glaucoma   . Peripheral vascular disease, unspecified     with Claudication  . COPD GOLD I 04/18/2013    Past Surgical History  Procedure Laterality Date  . Above knee leg amputation- [place]  1993    Right  . Angioplasty / stenting iliac  03/22/09    Aortogram-  left common iliac artery  . Hemorrhoid surgery    . Tonsillectomy    . Colonoscopy  Feb. 2013  . Video bronchoscopy Bilateral 04/10/2013    Procedure: VIDEO BRONCHOSCOPY WITH FLUORO;  Surgeon: Nyoka Cowden, MD;  Location: [facility];  Service: Cardiopulmonary;  Laterality: Bilateral;    Family History  Problem Relation Age of Onset  . Stroke Brother   . Stomach cancer Mother   . Asthma Sister   . Colon cancer Brother     History   Social History  . Marital Status: Widowed    Spouse Name: N/A    Number of Children: 1  . Years of Education: N/A   Occupational History  . 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 Smoking status: Former Smoker -- 0.50 packs/day for 60 years  Types: Cigarettes    Quit date: 07/31/2009  . Smokeless tobacco: Never Used  . Alcohol Use: No  . Drug Use: No  . Sexual Activity: No          Social History Narrative   Pt lives alone. She is HOH, especially left ear.    History  Smoking status  . Former Smoker -- 0.50 packs/day for 60 years  . Types: Cigarettes  . Quit date: 07/31/2009  Smokeless tobacco  . Never Used    History  Alcohol Use No     Allergies  Allergen Reactions  . Aspirin Anaphylaxis  . Morphine And Related Other (See Comments)    confusion    Current Outpatient Prescriptions  Medication Sig Dispense Refill  . acetaminophen (TYLENOL) 500 MG tablet Take 500 mg by mouth 2 (two) times daily as needed for pain.      Marland Kitchen  albuterol (PROAIR HFA) 108 (90 BASE) MCG/ACT inhaler Inhale 2 puffs into the lungs every 6 (six) hours as needed for wheezing.      . cholecalciferol (VITAMIN D) 1000 UNITS tablet Take 1,000 Units by mouth daily.      . furosemide (LASIX) 20 MG tablet Take 20 mg by mouth 2 (two) times a week. 1 tablet twice a week on Mon and Thurs as needed for edema.      . hydrALAZINE (APRESOLINE) 25 MG tablet Take 25 mg by mouth 2 (two) times daily.      . meclizine (ANTIVERT) 25 MG tablet Take 25 mg by mouth 3 (three) times daily as needed. For dizziness      . omeprazole (PRILOSEC) 20 MG capsule Take 20 mg by mouth daily before breakfast.       . pravastatin (PRAVACHOL) 20 MG tablet Take 20 mg by mouth at bedtime.       . RESTASIS 0.05 % ophthalmic emulsion Place 1 drop into both eyes every 12 (twelve) hours.       . Sennosides (SENNA LAX PO) Take 1-2 tablets by mouth daily as needed. For constipation      . traMADol (ULTRAM) 50 MG tablet Take 0.5 tablets (25 mg total) by mouth 2 (two) times daily as needed for pain.  30 tablet    . clopidogrel (PLAVIX) 75 MG tablet Take 75 mg by mouth 3 (three) times a week. Monday, Wednesday, friday       No current facility-administered medications for this visit.       Review of Systems:     Cardiac Review of Systems: Y or N  Chest Pain [   n ]  Resting SOB [n   ] Exertional SOB  Cove.Etienne  ]  Orthopnea [ n ]   Pedal Edema [ n  ]    Palpitations [n  ] Syncope  [ n ]   Presyncope [  y ]  General Review of Systems: [Y] = yes [  ]=no Constitional: recent weight change [ n ]; anorexia [  n]; fatigue [ y ]; nausea [n  ]; night sweats [ n ]; fever [ n ]; or chills [n  ];  Dental: poor dentition[ n ]; Last Dentist visit:   Eye : blurred vision [  ]; diplopia [   ]; vision changes [ n ];  Amaurosis fugax[n  ]; history of 3 tis but does not  remember symptoms Resp: cough [ y ];  wheezing[n  ];  hemoptysis[n  ]; shortness of breath[y  ]; paroxysmal nocturnal dyspnea[n  ]; dyspnea on exertion[  y]; or orthopnea[  ];  GI:  gallstones[  ], vomiting[ n ];  dysphagia[  ]; melena[  ];  hematochezia [ n ]; heartburn[ n ];   Hx of  Colonoscopy[  ]; GU: kidney stones [  ]; hematuria[  ];   dysuria [  ];  nocturia[  ];  history of     obstruction [  ]; urinary frequency [  ]             Skin: rash, swelling[  ];, hair loss[  ];  peripheral edema[  ];  or itching[  ]; Musculosketetal: myalgias[  ];  joint swelling[  ];  joint erythema[  ];  joint pain[  ];  back pain[  ];  Heme/Lymph: bruising[  ];  bleeding[  ];  anemia[  ];  Neuro: TIA[  ];  headaches[  ];  stroke[  ];  vertigo[  ];  seizures[ n ];   paresthesias[  ];  difficulty walking[ y ];  Psych:depression[  ]; anxiety[  ];  Endocrine: diabetes[ n ];  thyroid dysfunction[n  ];  Immunizations: Flu [ y ]; Pneumococcal[n  ];  Other:  Physical Exam: BP 156/76  Pulse 104  Resp 20  Ht 5' (1.524 m)  Wt 159 lb (72.122 kg)  BMI 31.05 kg/m2  SpO2 97%  General appearance: alert, cooperative and appears stated age Neurologic: intact Heart: regular rate and rhythm, S1, S2 normal, no murmur, click, rub or gallop Lungs: clear to auscultation bilaterally Abdomen: soft, non-tender; bowel sounds normal; no masses,  no organomegaly Extremities: extremities normal, atraumatic, no cyanosis or edema and rt aka, no pedal pulses on left, no carotid bruits Patient has no cervical supraclavicular or axillary adenopathy   Diagnostic Studies & Laboratory data:     Recent Radiology Findings:  Mr Lodema Pilot Contrast  05/14/2013   CLINICAL DATA:  Lung cancer. Question intracranial metastatic disease. Hypertension and hyperlipidemia.  EXAM: MRI HEAD WITHOUT AND WITH CONTRAST  TECHNIQUE: Multiplanar, multiecho pulse sequences of the brain and surrounding structures were obtained according to standard  protocol without and with intravenous contrast  CONTRAST:  15 cc MultiHance.  COMPARISON:  03/21/2012.  FINDINGS: No acute infarct.  No intracranial hemorrhage.  Incidentally noted is a small developmental venous anomaly right cerebellum.  No intracranial enhancing lesion or bony destructive lesion to suggest the presence of intracranial metastatic disease.  Partial opacification left mastoid air cells without obstructing lesion noted in the posterior superior nasopharynx. Minimal to mild paranasal sinus scratch at minimal paranasal sinus mucosal thickening.  Major intracranial vascular structures are patent.  Moderate small vessel disease type changes.  Global atrophy without hydrocephalus.  Cervical spondylotic changes and spinal stenosis C3-4 and C4-5.  Orbital structures unremarkable.  IMPRESSION: No evidence of intracranial metastatic disease. Please see above.   Electronically Signed   By: Bridgett Larsson M.D.   On: 05/14/2013 12:32   Nm Pet Image Initial (pi) Skull Base To Thigh  04/30/2013   CLINICAL DATA:  Initial treatment strategy for pulmonary nodule.  EXAM: NUCLEAR MEDICINE PET SKULL BASE TO  THIGH  FASTING BLOOD GLUCOSE:  Value:  95 mg/dl  TECHNIQUE: 16.1 mCi W-96 FDG was injected intravenously. CT data was obtained and used for attenuation correction and anatomic localization only. (This was not acquired as a diagnostic CT examination.) Additional exam technical data entered on technologist worksheet.  COMPARISON:  CT thorax 03/11/2013  FINDINGS: NECK  No hypermetabolic lymph nodes in the neck.  CHEST  Within the right upper lobe there is a hypermetabolic spiculated mass measuring 34 x 28 mm with SUV max = 23.5. No additional hypermetabolic nodules are identified. There is a single hypermetabolic right hilar lymph node which is difficult to define on the CT portion but felt to correlate to a 12 mm node on image 77 with SUV max = max 7.4. No discrete hypermetabolic mediastinal lymph nodes.   ABDOMEN/PELVIS  No abnormal hypermetabolic activity within the liver, pancreas, adrenal glands, or spleen. No hypermetabolic lymph nodes in the abdomen or pelvis.  SKELETON  No focal hypermetabolic activity to suggest skeletal metastasis.  IMPRESSION: Hypermetabolic right upper lobe mass without right hilar nodal metastasis. Staging by FDG PET/CT imaging T2a N1 M0 .   Electronically Signed   By: Genevive Bi M.D.   On: 04/30/2013 16:09   RADIOLOGY REPORT*  02/15/2012 Clinical Data: Shortness of breath and weakness.  CHEST - 2 VIEW  Comparison: 01/29/2009.  Findings: Trachea is midline. Heart size normal. Thoracic aorta  is calcified. There is added density in the apex of the right  hemithorax, in the region of the right first costochondral  junction, more prominent than on 01/29/2009. Minimal scarring at  the left lung base. Lungs are otherwise clear. No pleural fluid.  Degenerative changes are seen in the mid thoracic spine.  IMPRESSION:  Added density in the region of the right first costochondral  junction, increased in prominence from 01/29/2009. In this patient  with a smoking history, non emergent of CT chest without contrast  would be helpful in further evaluation, as clinically indicated, as  a pulmonary nodule cannot be excluded. These results will be called  to the ordering clinician or representative by the Radiologist  Assistant, and communication documented in the PACS Dashboard.  Original Report Authenticated By: Reyes Ivan, M.D.   RADIOLOGY REPORT*  Clinical Data: Cough and shortness of breath. Known apical mass.  CHEST - 2 VIEW  Comparison: 02/15/2012.  Findings: The cardiac silhouette, mediastinal and hilar contours  are normal and stable. Mild tortuosity and calcification of the  thoracic aorta. The pulmonary hila appear normal. There is a  right apical lung lesion worrisome for neoplasm and definitely  progressive since prior chest x-ray. No infiltrates or  effusions.  The bony thorax is intact.  IMPRESSION:  Right apical lesion. Recommend chest CT for further evaluation.  No acute infiltrates or effusions.  Original Report Authenticated By: Rudie Meyer, M.D.      RADIOLOGY REPORT*  Clinical Data: Enlarging right apical mass on recent chest  radiographs. Cough with night sweats and hoarseness.  CT CHEST WITH CONTRAST  Technique: Multidetector CT imaging of the chest was performed  following the standard protocol during bolus administration of  intravenous contrast.  Contrast: OMNIPAQUE IOHEXOL 300 MG/ML SOLN  Comparison: Chest radiographs 03/05/2013 and 02/15/2012.  Findings: Corresponding with the radiographic finding is a  spiculated right apical mass measuring 3.6 x 2.9 x 2.0 cm  consistent with bronchogenic carcinoma. This lesion demonstrates  no chest wall involvement. No other suspicious pulmonary nodules  demonstrated. There is  mild centrilobular emphysema.  Small AP window and right hilar lymph nodes are not pathologically  enlarged. There is no pleural or pericardial effusion.  There is mild atherosclerosis of the aorta, great vessels and  coronary arteries. No suspicious findings are present within the  upper abdomen. There are small calcified splenic granulomas.  There is mild reflux of contrasted blood into the IVC and hepatic  veins. There are no worrisome osseous findings.  IMPRESSION:  1. 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 Tissue sampling recommended.  2. No evidence of metastatic disease.  3. Mild centrilobular emphysema.  Original Report Authenticated By: Carey Bullocks, M.D.   Recent Lab Findings: Lab Results  Component Value Date   WBC 5.3 05/29/2013   HGB 13.0 05/29/2013   HCT 38.5 05/29/2013   PLT 290 05/29/2013   GLUCOSE 108* 04/10/2013   ALT 10 04/10/2013   AST 15 04/10/2013   NA 137 04/10/2013   K 4.1 04/10/2013   CL 105 04/10/2013   CREATININE 0.86 04/10/2013     BUN 13 04/10/2013   CO2 25 04/10/2013   INR 1.11 05/29/2013   PFT's ;Attempted DLCO but unable to do, FEV1 1.1 82% - 6% increase with bronchiodilators Walked  in office more then 5 min- became SOB but with  O2 sat 98% on RA  Assessment / Plan:   Clinicial cT2a,cN1,cM0 Carcinoma of  the right Upper Lung Limitied mobility secondary to rt leg ambulation and peripherial vascular disease Mild COPD, unable to do DLCO MRI of the brain shows no evidence of metastatic disease Patient case was seen at the multidisciplinary thoracic oncology conference last week, treatment options of high risk surgical resection versus biopsy with needle or ENB were discussed with chemo radiation were discussed. The options were again discussed with the patient and her grandaughter.  After multiple visits and review with the patient and her family, the patient wishes to proceed with surgical resection. She understands there is some increase surgical risk because of her age, but is willing to proceed. Tentative arrangements for surgery Monday, November 17.    Delight Ovens MD   [address] [place].[address] [place] [address] Office [phone]   Beeper [phone]  06/09/2013 2:58 PM

## 2013-06-09 NOTE — Patient Instructions (Signed)
Lung Cancer  Lung cancer is a tumor which starts as a growth in your lungs. Cancer is a group of many related diseases that begin in cells, the building blocks of the body. Normally, cells grow and divide to produce more cells only when the body needs them. Sometimes cells keep dividing when new cells are not needed. These extra cells may form a mass of tissue called a growth or tumor. Tumors can be either benign (not cancerous) or malignant (cancerous). Cancer can begin in any organ or tissue of the body. The original tumor (where the tumor started out) is called the primary cancer and is usually named for where it begins.   Lung cancer is the most common cause of cancer death in men and women. There are several different types of lung cancers. Usually, lung cancer is described as either small-cell lung cancer or non-small-cell lung cancer. Other types of cancer occur in the lungs, including carcinoid and cancers spread from other organs. The types of cancer have different behavior and treatment.  CAUSES   This cancer usually starts when the lungs are exposed to harmful chemicals. When you quit smoking, your risk of lung cancer falls each year (but is never the same as a person who has never smoked).   Other risks include:    Radon gas exposure.   Asbestos and other industrial substance exposure.   Second hand tobacco smoke.   Air pollution.   Family or personal history of lung cancer.   Age over 65.  SYMPTOMS   Lung cancer can cause many symptoms. They depend on the type of cancer, its location and other factors.  Symptoms of lung cancer can include:   Cough (either new, different or more severe).   Shortness of breath.   Coughing up blood (hemoptysis).   Chest pain.   Hoarseness.   Swelling of the face.   Drooping eyelid.   Changes in blood tests: low sodium (hyponatremia), high calcium (hypercalcemia) or low blood count (anemia).   Weight loss.  In its early stages, lung cancer may not have  symptoms and can be discovered by accident. Many of the symptoms above can be caused by diseases other than lung cancer.  DIAGNOSIS   In early lung cancer, the patient often does not notice problems. It usually has spread by the time problems are first noticed. Your caregiver may suspect lung cancer based on your symptoms, your exam or based on tests (such as x-rays) obtained for other reasons. Common tests that help your caregiver diagnose your condition include:   Chest x-ray.   CT scan of the lungs and chest.   Blood tests.  If a tumor is found, a biopsy will be necessary to confirm that cancer is present and to determine the type of cancer.  TREATMENT    Surgery offers a hope for a cure if the cancer has not spread and the cancer is not a small cell (oat cell) cancer of the lung. Surgery cannot cure the small cell type of cancer.   Radiation Therapy is a form of high energy X-ray that helps slow or kill the cancer. It is often used along with medications (chemotherapy) to help treat the cancer and control pain.   Chemotherapy is used in combination with surgery in advanced cancer. It is also used in all small cell cancers.   Many new treatments look promising.   Your caregiver can give you more information and discuss treatment options that are best for   see a cancer specialist, if that has not been arranged.  If you require oxygen or breathing equipment, be sure you know how to use it and who to call with questions.  Follow any special diet directions. If you have problems with appetite, ask your caregiver for help. SEEK MEDICAL CARE IF:   You have had a surgical procedure are you are having trouble recovering.  You have ongoing weight loss.  You have decreased strength or energy past the  point when your caregiver said you would feel better.  You develop nausea or lightheadedness.  You have pain that is not improving. SEEK IMMEDIATE MEDICAL CARE IF:   You cough up clotted blood or bright red blood.  Your pain is uncontrolled.  You develop new difficulty breathing or chest pain.  You develop swelling in one or both ankles or legs, or swelling in your face or neck.  You develop new headache or confusion. Document Released: 10/23/2000 Document Revised: 10/09/2011 Document Reviewed: 08/03/2008 [facility] Patient Information 2014 [place], [place]. Lung Resection A lung resection is surgery to remove a lung. When an entire lung is removed, the procedure is called a pneumonectomy. When only part of a lung is removed, the procedure is called a lobectomy. A lung resection is typically done to get rid of a tumor or cancer. This surgery can help relieve some or all of your symptoms. The surgery can also help keep the problem from getting worse. It may provide the best chance for curing your disease. However, surgery may not necessarily cure lung cancer, if that is the problem. Most people need to stay in the hospital for several days after this procedure.  LET YOUR CAREGIVER KNOW ABOUT:  Allergies to food or medicine.  Medicines taken, including vitamins, herbs, eyedrops, over-the-counter medicines, and creams.  Use of steroids (by mouth or creams).  Previous problems with anesthetics or numbing medicines.  History of bleeding problems or blood clots.  Previous surgery.  Other health problems, including diabetes and kidney problems.  Possibility of pregnancy, if this applies. RISKS AND COMPLICATIONS  Lung resections have been done for many years with good results and few complications. However, all surgery is associated with possible risks. Some of these risks are:  Excessive bleeding.  Infection.  Inability to breath without a ventilator.  Persistent shortness of  breath.  Heart problems, including abnormal rhythms and a risk of heart attack or heart failure.  Blood clots.  Injury to a blood vessel.  Injury to a nerve.  Failure to heal properly.  Stroke.  Bronchopleural fistula. This is a small hole between one of the main breathing tubes and the lining of the lungs. BEFORE THE PROCEDURE  In order to prepare for surgery, your caregiver may ask for several tests to be done. These may include:  Blood tests.  Urine tests.  X-rays.  Imaging tests, such as CT scans, MRI scans, and PET scans. These tests are done to find the exact size and location of the tumor that will be removed.  Pulmonary function tests (PFTs). These are breathing tests to assess the function of your lungs before surgery and to decide how to best help your breathing after surgery.  Heart testing. This is done to make sure your heart is strong enough for the procedure.  Bronchoscopy. This is a technique that allows your caregiver to look at the inside of your airways. This is done using a soft, flexible tube (bronchoscope). Along with imaging tests, this can help your  caregiver know the exact location and size of the area that will be removed during surgery.  Lymph node sampling. This may need to be done to see if the tumor has spread. It may be done as a separate surgery or right before your lung resection procedure. PROCEDURE  An intravenous line (IV) will be placed in your arm. You will be given medicine that makes you sleep (general anesthetic).  Once you are asleep, a breathing tube is placed into your windpipe. You may also get pain medicine through a thin, flexible tube (catheter) in your back. The catheter is put through your skin and next to your spinal cord, where it releases anesthetic medicine.  Next, you will be turned onto your side. This makes it easier for your surgeon to reach the area of your ribcage where the surgical cut (incision) will be made. This  area is washed with a disinfectant solution and might also be shaved. A catheter will be put into your bladder to collect urine. Another tube will be carefully passed through your throat and into your stomach.  The surgeon will make an incision on your side, which will start between two of your ribs and go around to your back. Your ribs will be spread and held open. Part of one rib may be removed to make it easier for the surgeon to reach your lung.  Your surgeon will carefully cut the veins, arteries, and bronchus leading to the lung. After being cut, each of these pieces will be sewn or stapled closed. Then, the lung or part of the lung will be removed.  Your surgeon will check inside your chest to make sure there is no bleeding in or around the lungs. Lymph nodes near the lung may also be removed for later tests. This is done to check if your problems have spread to the lymph nodes.  Depending on your situation, your surgeon may put tubes into your chest to drain extra fluid and air from the chest cavity after surgery. After the tubes are in, your ribcage will be closed with stitches. The stitches help your ribcage heal and keep it from moving. After this, the layers of tissue under the skin are closed with more stitches, which will dissolve inside your body over time. Finally, your skin is closed with stitches or staples and covered with a bandage. AFTER THE PROCEDURE   After surgery, you will be taken to the recovery area where a nurse will monitor your progress. You may still have a breathing tube, spinal catheter, bladder catheter, stomach tube, and possibly chest tubes inside your body. These will be removed during your recovery. You may be put on a respirator following surgery if some assistance is needed to help your breathing. When you are awake, stable, and without complications, you will likely continue recovery in the intensive care unit (ICU).  As you wake up, you might feel some aches  and pains in your chest and throat. Sometimes during recovery, patients may shiver or feel nauseous. Both of these symptoms are temporary and may be caused by the anesthesia. Your caregivers can give you medicine to help these problems go away.  The breathing tube will be taken out as soon as your caregivers feel you can breathe on your own. For most people, this happens on the same day as the surgery.  If your surgery and time in the ICU go well, most of the tubes and equipment will be taken out within the first 1  to 2 days after surgery. This is about how long most people stay in the ICU. You may need to stay longer, depending on how you are doing.  You should also start respiratory therapy in the ICU. This therapy uses breathing exercises to help your other lung stay healthy and get stronger.  As you improve, you will be moved to a regular hospital room for continued respiratory therapy, help with your bladder and bowels, and to continue medicines. Most people stay in the hospital for 5 to 7 days. However, your stay may be longer, depending on how your surgery went and how well you are doing.  After your lung or part of your lung is taken out, there will be a space inside your chest. This space will often fill up with fluid over time. The amount of time this takes is different for each person. Because your chest needs to fill with fluid, your surgeon may or may not put a drainage tube in your chest. If there is a chest tube, it will most likely be removed within 24 hours after the surgery.  You will receive care until you are doing well and your caregiver feels it is safe for you to go home or to transfer to an extended care facility. Document Released: 10/07/2002 Document Revised: 10/09/2011 Document Reviewed: 03/16/2011 [facility] Patient Information 2014 [place], [place]. Lung Resection Care After Refer to this sheet in the next few weeks. These instructions provide you with information on caring  for yourself after your procedure. Your caregiver may also give you more specific instructions. Your treatment has been planned according to current medical practices, but problems sometimes occur. Call your caregiver if you have any problems or questions after your procedure. HOME CARE INSTRUCTIONS  You may resume a normal diet and activities as directed.  Do not smoke or use tobacco products.  Change your bandages (dressings) as directed.  Only take over-the-counter or prescription medicines for pain, discomfort, or fever as directed by your caregiver.  Keep all follow-up appointments as directed.  Try to breathe deeply and cough as directed. Holding a pillow firmly over your ribs may help with discomfort.  If you were given an incentive spirometer in the hospital, continue to use it as directed.  Walk as directed by your caregiver.  You may take a shower and gently wash the area of your surgical cut (incision) with water and soap as directed. Do not use anything else to clean your incision except as directed by your caregiver. Do not take baths or sit in a hot tub. SEEK MEDICAL CARE IF:  You notice redness, swelling, or increasing pain in the incision.  You are bleeding from the incision.  You see pus coming from the incision.  You notice a bad smell coming from the incision or dressing.  Your incision breaks open.  You cough up blood or pus, or you develop a cough that produces bad smelling sputum.  You have pain or swelling in your legs.  You have increasing pain that is not controlled with medicine.  You have trouble managing any of the tubes that have been left in place after surgery. SEEK IMMEDIATE MEDICAL CARE IF:   You have a fever or chills.  You have any reaction or side effects to medicines given.  You have chest pain or an irregular or rapid heartbeat.  You have dizzy episodes or fainting.  You have shortness of breath or difficulty breathing.  You  have persistent nausea or  vomiting.  You have a rash. MAKE SURE YOU:  Understand these instructions.  Will watch your condition.  Will get help right away if you are not doing well or get worse. Document Released: 02/03/2005 Document Revised: 10/09/2011 Document Reviewed: 03/16/2011 [facility] Patient Information 2014 [place], [place].

## 2013-06-10 ENCOUNTER — Ambulatory Visit: Payer: Medicare Other | Admitting: Cardiothoracic Surgery

## 2013-06-11 NOTE — Pre-Procedure Instructions (Signed)
[name]  06/11/2013   Your procedure is scheduled on:  06/16/13  [facility]  2 * 3 at 530 AM.  Call this number if you have problems the morning of surgery: [phone]   Remember:   Do not eat food or drink liquids after midnight.   Take these medicines the morning of surgery with A SIP OF WATER: all inhalers,hydrazine,prilosec   Do not wear jewelry, make-up or nail polish.  Do not wear lotions, powders, or perfumes. You may wear deodorant.  Do not shave 48 hours prior to surgery. Men may shave face and neck.  Do not bring valuables to the hospital.  [facility] is not responsible                  for any belongings or valuables.               Contacts, dentures or bridgework may not be worn into surgery.  Leave suitcase in the car. After surgery it may be brought to your room.  For patients admitted to the hospital, discharge time is determined by your                treatment team.               Patients discharged the day of surgery will not be allowed to drive  home.  Name and phone number of your driver: family  Special Instructions: Shower using CHG 2 nights before surgery and the night before surgery.  If you shower the day of surgery use CHG.  Use special wash - you have one bottle of CHG for all showers.  You should use approximately 1/3 of the bottle for each shower.   Please read over the following fact sheets that you were given: Pain Booklet, Coughing and Deep Breathing, Blood Transfusion Information, MRSA Information and Surgical Site Infection Prevention

## 2013-06-12 ENCOUNTER — Other Ambulatory Visit: Payer: Self-pay

## 2013-06-12 ENCOUNTER — Ambulatory Visit (HOSPITAL_COMMUNITY)
Admission: RE | Admit: 2013-06-12 | Discharge: 2013-06-12 | Disposition: A | Discharge status: Home / Self Care | Payer: Medicare Other | Source: Ambulatory Visit | Attending: Cardiothoracic Surgery | Admitting: Cardiothoracic Surgery

## 2013-06-12 ENCOUNTER — Encounter (HOSPITAL_COMMUNITY): Payer: Self-pay

## 2013-06-12 ENCOUNTER — Encounter (HOSPITAL_COMMUNITY)
Admission: RE | Admit: 2013-06-12 | Discharge: 2013-06-12 | Disposition: A | Discharge status: Home / Self Care | Payer: Medicare Other | Source: Ambulatory Visit | Attending: Cardiothoracic Surgery | Admitting: Cardiothoracic Surgery

## 2013-06-12 VITALS — BP 142/69 | HR 74 | Temp 97.8°F | Resp 20 | Ht 60.0 in | Wt 158.5 lb

## 2013-06-12 DIAGNOSIS — R918 Other nonspecific abnormal finding of lung field: Secondary | ICD-10-CM

## 2013-06-12 DIAGNOSIS — R222 Localized swelling, mass and lump, trunk: Secondary | ICD-10-CM | POA: Diagnosis not present

## 2013-06-12 DIAGNOSIS — Z01818 Encounter for other preprocedural examination: Secondary | ICD-10-CM | POA: Insufficient documentation

## 2013-06-12 HISTORY — DX: Shortness of breath: R06.02

## 2013-06-12 HISTORY — DX: Malignant (primary) neoplasm, unspecified: C80.1

## 2013-06-12 LAB — URINALYSIS, ROUTINE W REFLEX MICROSCOPIC
Bilirubin Urine: NEGATIVE
Glucose, UA: NEGATIVE mg/dL
Hgb urine dipstick: NEGATIVE
Ketones, ur: NEGATIVE mg/dL
Nitrite: NEGATIVE
Protein, ur: NEGATIVE mg/dL
Specific Gravity, Urine: 1.021 (ref 1.005–1.030)
Urobilinogen, UA: 1 mg/dL (ref 0.0–1.0)
pH: 5.5 (ref 5.0–8.0)

## 2013-06-12 LAB — COMPREHENSIVE METABOLIC PANEL
ALT: 9 U/L (ref 0–35)
AST: 17 U/L (ref 0–37)
Albumin: 3.4 g/dL — ABNORMAL LOW (ref 3.5–5.2)
Alkaline Phosphatase: 61 U/L (ref 39–117)
BUN: 13 mg/dL (ref 6–23)
CO2: 23 mEq/L (ref 19–32)
Calcium: 9.1 mg/dL (ref 8.4–10.5)
Chloride: 104 mEq/L (ref 96–112)
Creatinine, Ser: 0.87 mg/dL (ref 0.50–1.10)
GFR calc Af Amer: 69 mL/min — ABNORMAL LOW (ref >90)
GFR calc non Af Amer: 60 mL/min — ABNORMAL LOW (ref >90)
Glucose, Bld: 103 mg/dL — ABNORMAL HIGH (ref 70–99)
Potassium: 3.8 mEq/L (ref 3.5–5.1)
Sodium: 138 mEq/L (ref 135–145)
Total Bilirubin: 0.1 mg/dL — ABNORMAL LOW (ref 0.3–1.2)
Total Protein: 7.3 g/dL (ref 6.0–8.3)

## 2013-06-12 LAB — BLOOD GAS, ARTERIAL
Acid-base deficit: 2.8 mmol/L — ABNORMAL HIGH (ref 0.0–2.0)
Bicarbonate: 20.6 mEq/L (ref 20.0–24.0)
Drawn by: 344381
FIO2: 0.21 %
O2 Saturation: 97 %
Patient temperature: 98.6
TCO2: 21.5 mmol/L (ref 0–100)
pCO2 arterial: 30.2 mmHg — ABNORMAL LOW (ref 35.0–45.0)
pH, Arterial: 7.448 (ref 7.350–7.450)
pO2, Arterial: 84.3 mmHg (ref 80.0–100.0)

## 2013-06-12 LAB — CBC
HCT: 40.4 % (ref 36.0–46.0)
Hemoglobin: 13.5 g/dL (ref 12.0–15.0)
MCH: 29.4 pg (ref 26.0–34.0)
MCHC: 33.4 g/dL (ref 30.0–36.0)
MCV: 88 fL (ref 78.0–100.0)
Platelets: 243 10*3/uL (ref 150–400)
RBC: 4.59 MIL/uL (ref 3.87–5.11)
RDW: 13.7 % (ref 11.5–15.5)
WBC: 6.7 10*3/uL (ref 4.0–10.5)

## 2013-06-12 LAB — ABO/RH: ABO/RH(D): AB POS

## 2013-06-12 LAB — SURGICAL PCR SCREEN
MRSA, PCR: NEGATIVE
Staphylococcus aureus: NEGATIVE

## 2013-06-12 LAB — APTT: aPTT: 30 seconds (ref 24–37)

## 2013-06-12 LAB — URINE MICROSCOPIC-ADD ON

## 2013-06-12 LAB — PROTIME-INR
INR: 1.02 (ref 0.00–1.49)
Prothrombin Time: 13.2 seconds (ref 11.6–15.2)

## 2013-06-12 NOTE — Pre-Procedure Instructions (Signed)
[name]  06/12/2013   Your procedure is scheduled on:  06/16/13  Report to [facility]  2 * 3 at 530 AM.  Call this number if you have problems the morning of surgery: [phone]   Remember:   Do not eat food or drink liquids after midnight.   Take these medicines the morning of surgery with A SIP OF WATER: all inhalers,hydrlazine,prilosec   Do not wear jewelry, make-up or nail polish.  Do not wear lotions, powders, or perfumes. You may wear deodorant.  Do not shave 48 hours prior to surgery. Men may shave face and neck.  Do not bring valuables to the hospital.  [facility] is not responsible                  for any belongings or valuables.               Contacts, dentures or bridgework may not be worn into surgery.  Leave suitcase in the car. After surgery it may be brought to your room.  For patients admitted to the hospital, discharge time is determined by your                treatment team.               Patients discharged the day of surgery will not be allowed to drive  home.  Name and phone number of your driver: family  Special Instructions: Incentive Spirometry - Practice and bring it with you on the day of surgery.   Please read over the following fact sheets that you were given: Pain Booklet, Coughing and Deep Breathing, Blood Transfusion Information, Surgical Site Infection Prevention and Anesthesia Post-op Instructions

## 2013-06-13 NOTE — Progress Notes (Signed)
Anesthesia Chart Review:  Patient is a 79 year old female scheduled for bronchoscopy, right VATS, lung resection for RUL mass on 06/16/13 by Dr. Tyrone Sage.  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 History includes former smoker, lung cancer, obesity, HTN, HLD, CVA, glaucoma, COPD Gold I, PAD s/p left CIA stent and s/p right AKA, left BBB. Pulmonologist is Dr. Sherene Sires. ONC is Dr. Arbutus Ped. VASC is Dr. Edilia Bo.  She saw cardiologist Dr. Jens Som in 2013, but more recently had a stress test in May 2014 that was ordered by Dr. Sharyn Lull.  EKG on 06/12/13 showed NSR, left BBB. It was not felt significantly changed from her previous EKG.  Nuclear stress test on 12/18/12 showed: 1. Small left ventricle without evidence of pharmacologically induced myocardial ischemia.  2. The calculated left ventricular ejection fraction is 96%. Ejection fractions are typically overestimated with this technique and small cardiac volumes. She had an incomplete left BBB on baseline EKG.  PFTs from 05/13/13: Attempted DLCO but unable to do, FEV1 1.1 82% - 6% increase with bronchodilators. Walked in office more then 5 min- became SOB but with O2 sat 98% on RA.  Preoperative CXR and labs noted.  She has a left BBB, but with a non-ischemic stress test earlier this year.  If no acute changes then I would anticipate that she could proceed as planned.  Velna Ochs [facility] Stay [facility]/Anesthesiology Phone [phone] 06/13/2013 9:58 AM

## 2013-06-15 ENCOUNTER — Encounter (HOSPITAL_COMMUNITY): Payer: Self-pay | Admitting: Certified Registered Nurse Anesthetist

## 2013-06-15 MED ORDER — DEXTROSE 5 % IV SOLN
1.5000 g | INTRAVENOUS | Status: AC
  Administered 2013-06-16: 1.5 g via INTRAVENOUS
  Filled 2013-06-15: qty 1.5

## 2013-06-16 ENCOUNTER — Encounter (HOSPITAL_COMMUNITY): Payer: Medicare Other | Admitting: Vascular Surgery

## 2013-06-16 ENCOUNTER — Encounter (HOSPITAL_COMMUNITY)
Admission: RE | Disposition: A | Discharge status: Home Health | Payer: Self-pay | Source: Ambulatory Visit | Attending: Cardiothoracic Surgery

## 2013-06-16 ENCOUNTER — Inpatient Hospital Stay (HOSPITAL_COMMUNITY): Payer: Medicare Other | Admitting: Certified Registered Nurse Anesthetist

## 2013-06-16 ENCOUNTER — Inpatient Hospital Stay (HOSPITAL_COMMUNITY)
Admission: RE | Admit: 2013-06-16 | Discharge: 2013-06-25 | DRG: 164 | Disposition: A | Discharge status: Home Health | Payer: Medicare Other | Source: Ambulatory Visit | Attending: Cardiothoracic Surgery | Admitting: Cardiothoracic Surgery

## 2013-06-16 ENCOUNTER — Encounter (HOSPITAL_COMMUNITY): Payer: Self-pay | Admitting: *Deleted

## 2013-06-16 ENCOUNTER — Inpatient Hospital Stay (HOSPITAL_COMMUNITY): Payer: Medicare Other

## 2013-06-16 DIAGNOSIS — M129 Arthropathy, unspecified: Secondary | ICD-10-CM | POA: Diagnosis present

## 2013-06-16 DIAGNOSIS — J9382 Other air leak: Secondary | ICD-10-CM | POA: Diagnosis not present

## 2013-06-16 DIAGNOSIS — D381 Neoplasm of uncertain behavior of trachea, bronchus and lung: Secondary | ICD-10-CM

## 2013-06-16 DIAGNOSIS — I10 Essential (primary) hypertension: Secondary | ICD-10-CM | POA: Diagnosis present

## 2013-06-16 DIAGNOSIS — R918 Other nonspecific abnormal finding of lung field: Secondary | ICD-10-CM

## 2013-06-16 DIAGNOSIS — J449 Chronic obstructive pulmonary disease, unspecified: Secondary | ICD-10-CM | POA: Diagnosis present

## 2013-06-16 DIAGNOSIS — R062 Wheezing: Secondary | ICD-10-CM | POA: Diagnosis not present

## 2013-06-16 DIAGNOSIS — C3432 Malignant neoplasm of lower lobe, left bronchus or lung: Secondary | ICD-10-CM | POA: Diagnosis present

## 2013-06-16 DIAGNOSIS — S78119A Complete traumatic amputation at level between unspecified hip and knee, initial encounter: Secondary | ICD-10-CM

## 2013-06-16 DIAGNOSIS — D649 Anemia, unspecified: Secondary | ICD-10-CM | POA: Diagnosis not present

## 2013-06-16 DIAGNOSIS — R5381 Other malaise: Secondary | ICD-10-CM | POA: Diagnosis not present

## 2013-06-16 DIAGNOSIS — C3410 Malignant neoplasm of upper lobe, unspecified bronchus or lung: Principal | ICD-10-CM | POA: Diagnosis present

## 2013-06-16 DIAGNOSIS — I739 Peripheral vascular disease, unspecified: Secondary | ICD-10-CM | POA: Diagnosis present

## 2013-06-16 DIAGNOSIS — K219 Gastro-esophageal reflux disease without esophagitis: Secondary | ICD-10-CM | POA: Diagnosis present

## 2013-06-16 DIAGNOSIS — Z87891 Personal history of nicotine dependence: Secondary | ICD-10-CM

## 2013-06-16 DIAGNOSIS — E119 Type 2 diabetes mellitus without complications: Secondary | ICD-10-CM | POA: Diagnosis present

## 2013-06-16 DIAGNOSIS — K5900 Constipation, unspecified: Secondary | ICD-10-CM | POA: Diagnosis not present

## 2013-06-16 DIAGNOSIS — J982 Interstitial emphysema: Secondary | ICD-10-CM | POA: Diagnosis present

## 2013-06-16 DIAGNOSIS — Z8673 Personal history of transient ischemic attack (TIA), and cerebral infarction without residual deficits: Secondary | ICD-10-CM

## 2013-06-16 DIAGNOSIS — E785 Hyperlipidemia, unspecified: Secondary | ICD-10-CM | POA: Diagnosis present

## 2013-06-16 HISTORY — DX: Malignant neoplasm of unspecified part of unspecified bronchus or lung: C34.90

## 2013-06-16 HISTORY — PX: VIDEO BRONCHOSCOPY: SHX5072

## 2013-06-16 HISTORY — PX: VIDEO ASSISTED THORACOSCOPY (VATS)/WEDGE RESECTION: SHX6174

## 2013-06-16 LAB — BLOOD GAS, ARTERIAL
Acid-base deficit: 3.3 mmol/L — ABNORMAL HIGH (ref 0.0–2.0)
Bicarbonate: 22.3 mEq/L (ref 20.0–24.0)
O2 Content: 4 L/min
O2 Saturation: 96.7 %
Patient temperature: 98.6
TCO2: 23.8 mmol/L (ref 0–100)
pCO2 arterial: 48.4 mmHg — ABNORMAL HIGH (ref 35.0–45.0)
pH, Arterial: 7.287 — ABNORMAL LOW (ref 7.350–7.450)
pO2, Arterial: 103 mmHg — ABNORMAL HIGH (ref 80.0–100.0)

## 2013-06-16 LAB — TYPE AND SCREEN
ABO/RH(D): AB POS
ABO/RH(D): AB POS
Antibody Screen: NEGATIVE
Antibody Screen: NEGATIVE

## 2013-06-16 SURGERY — BRONCHOSCOPY, VIDEO-ASSISTED
Anesthesia: General | Site: Chest | Laterality: Right | Wound class: Clean Contaminated

## 2013-06-16 MED ORDER — ONDANSETRON HCL 4 MG/2ML IJ SOLN: 4.0000 mg | Freq: Once | INTRAMUSCULAR | Status: DC | PRN

## 2013-06-16 MED ORDER — ALBUTEROL SULFATE HFA 108 (90 BASE) MCG/ACT IN AERS
4.0000 | INHALATION_SPRAY | Freq: Four times a day (QID) | RESPIRATORY_TRACT | Status: AC
  Administered 2013-06-16 – 2013-06-18 (×7): 4 via RESPIRATORY_TRACT
  Filled 2013-06-16 (×2): qty 6.7

## 2013-06-16 MED ORDER — KCL IN DEXTROSE-NACL 20-5-0.45 MEQ/L-%-% IV SOLN
INTRAVENOUS | Status: DC
  Administered 2013-06-16 – 2013-06-20 (×4): via INTRAVENOUS
  Filled 2013-06-16 (×8): qty 1000

## 2013-06-16 MED ORDER — LIDOCAINE HCL (CARDIAC) 20 MG/ML IV SOLN
INTRAVENOUS | Status: DC | PRN
  Administered 2013-06-16: 80 mg via INTRAVENOUS

## 2013-06-16 MED ORDER — HEMOSTATIC AGENTS (NO CHARGE) OPTIME
TOPICAL | Status: DC | PRN
  Administered 2013-06-16: 1 via TOPICAL

## 2013-06-16 MED ORDER — EPINEPHRINE HCL 0.1 MG/ML IJ SOSY: PREFILLED_SYRINGE | INTRAMUSCULAR | Status: DC | PRN

## 2013-06-16 MED ORDER — NEOSTIGMINE METHYLSULFATE 1 MG/ML IJ SOLN
INTRAMUSCULAR | Status: DC | PRN
  Administered 2013-06-16: 3 mg via INTRAVENOUS

## 2013-06-16 MED ORDER — FENTANYL 10 MCG/ML IV SOLN
INTRAVENOUS | Status: DC
  Administered 2013-06-16: 16:00:00 via INTRAVENOUS
  Administered 2013-06-16: 130 ug via INTRAVENOUS
  Administered 2013-06-16: 60 ug via INTRAVENOUS
  Administered 2013-06-17: 07:00:00 via INTRAVENOUS
  Administered 2013-06-17: 70 ug via INTRAVENOUS
  Administered 2013-06-17: 180 ug via INTRAVENOUS
  Administered 2013-06-17: 30 ug/h via INTRAVENOUS
  Administered 2013-06-17: 90 ug via INTRAVENOUS
  Administered 2013-06-17: 100 ug via INTRAVENOUS
  Administered 2013-06-18: 60 ug via INTRAVENOUS
  Administered 2013-06-18: 20 ug via INTRAVENOUS
  Administered 2013-06-18 (×2): 30 ug via INTRAVENOUS
  Administered 2013-06-19: 20 ug via INTRAVENOUS
  Administered 2013-06-19: 10 ug via INTRAVENOUS
  Administered 2013-06-19: 50 ug via INTRAVENOUS
  Administered 2013-06-19: 14:00:00 via INTRAVENOUS
  Administered 2013-06-19: 70 ug via INTRAVENOUS
  Administered 2013-06-20: 108.1 ug via INTRAVENOUS
  Administered 2013-06-20: 40 ug via INTRAVENOUS
  Administered 2013-06-20: 30 ug via INTRAVENOUS
  Administered 2013-06-20: 95.99 ug via INTRAVENOUS
  Administered 2013-06-20: 100 ug via INTRAVENOUS
  Administered 2013-06-20: 40 ug via INTRAVENOUS
  Administered 2013-06-20: 20:00:00 via INTRAVENOUS
  Administered 2013-06-20: 22 ug via INTRAVENOUS
  Administered 2013-06-21: 80 ug via INTRAVENOUS
  Administered 2013-06-21: 10 ug via INTRAVENOUS
  Administered 2013-06-21: 20 ug via INTRAVENOUS
  Administered 2013-06-21: 40 ug via INTRAVENOUS
  Administered 2013-06-21 – 2013-06-22 (×2): 20 ug via INTRAVENOUS
  Administered 2013-06-22: 10 ug via INTRAVENOUS
  Administered 2013-06-22: 20 ug via INTRAVENOUS
  Administered 2013-06-22: 10 ug via INTRAVENOUS
  Administered 2013-06-22: 50 ug via INTRAVENOUS
  Administered 2013-06-23: 10 ug/h via INTRAVENOUS
  Administered 2013-06-23: 3 ug via INTRAVENOUS
  Administered 2013-06-23: 10 ug via INTRAVENOUS
  Filled 2013-06-16 (×4): qty 50

## 2013-06-16 MED ORDER — HYDROMORPHONE HCL PF 1 MG/ML IJ SOLN
0.2500 mg | INTRAMUSCULAR | Status: DC | PRN
  Administered 2013-06-16 (×4): 0.5 mg via INTRAVENOUS

## 2013-06-16 MED ORDER — CYCLOSPORINE 0.05 % OP EMUL
1.0000 [drp] | Freq: Two times a day (BID) | OPHTHALMIC | Status: DC
  Administered 2013-06-16 – 2013-06-18 (×5): 1 [drp] via OPHTHALMIC
  Administered 2013-06-19: 11:00:00 via OPHTHALMIC
  Administered 2013-06-19 – 2013-06-24 (×11): 1 [drp] via OPHTHALMIC
  Filled 2013-06-16 (×20): qty 1

## 2013-06-16 MED ORDER — HYDROMORPHONE HCL PF 1 MG/ML IJ SOLN
INTRAMUSCULAR | Status: AC
  Filled 2013-06-16: qty 1

## 2013-06-16 MED ORDER — LACTATED RINGERS IV SOLN
INTRAVENOUS | Status: DC | PRN
  Administered 2013-06-16 (×2): via INTRAVENOUS

## 2013-06-16 MED ORDER — ACETAMINOPHEN 160 MG/5ML PO SOLN: 1000.0000 mg | Freq: Four times a day (QID) | ORAL | Status: AC

## 2013-06-16 MED ORDER — MIDAZOLAM HCL 5 MG/5ML IJ SOLN
INTRAMUSCULAR | Status: DC | PRN
  Administered 2013-06-16: 1 mg via INTRAVENOUS

## 2013-06-16 MED ORDER — DIPHENHYDRAMINE HCL 12.5 MG/5ML PO ELIX
12.5000 mg | ORAL_SOLUTION | Freq: Four times a day (QID) | ORAL | Status: DC | PRN
  Filled 2013-06-16: qty 5

## 2013-06-16 MED ORDER — BUPIVACAINE 0.5 % ON-Q PUMP SINGLE CATH 400 ML
400.0000 mL | INJECTION | Status: DC
  Filled 2013-06-16: qty 400

## 2013-06-16 MED ORDER — PHENYLEPHRINE HCL 10 MG/ML IJ SOLN
INTRAMUSCULAR | Status: DC | PRN
  Administered 2013-06-16 (×2): 40 ug via INTRAVENOUS

## 2013-06-16 MED ORDER — DIPHENHYDRAMINE HCL 50 MG/ML IJ SOLN: 12.5000 mg | Freq: Four times a day (QID) | INTRAMUSCULAR | Status: DC | PRN

## 2013-06-16 MED ORDER — PROPOFOL 10 MG/ML IV BOLUS
INTRAVENOUS | Status: DC | PRN
  Administered 2013-06-16: 200 mg via INTRAVENOUS

## 2013-06-16 MED ORDER — BUPIVACAINE ON-Q PAIN PUMP (FOR ORDER SET NO CHG)
INJECTION | Status: DC
  Filled 2013-06-16: qty 1

## 2013-06-16 MED ORDER — ACETAMINOPHEN 500 MG PO TABS
1000.0000 mg | ORAL_TABLET | Freq: Four times a day (QID) | ORAL | Status: AC
  Administered 2013-06-16 – 2013-06-17 (×3): 1000 mg via ORAL
  Filled 2013-06-16 (×4): qty 2

## 2013-06-16 MED ORDER — OXYCODONE HCL 5 MG PO TABS: 5.0000 mg | ORAL_TABLET | ORAL | Status: AC | PRN

## 2013-06-16 MED ORDER — HYDRALAZINE HCL 25 MG PO TABS
25.0000 mg | ORAL_TABLET | Freq: Two times a day (BID) | ORAL | Status: DC
  Administered 2013-06-16 – 2013-06-24 (×17): 25 mg via ORAL
  Filled 2013-06-16 (×19): qty 1

## 2013-06-16 MED ORDER — GLYCOPYRROLATE 0.2 MG/ML IJ SOLN
INTRAMUSCULAR | Status: DC | PRN
  Administered 2013-06-16: .4 mg via INTRAVENOUS

## 2013-06-16 MED ORDER — BUPIVACAINE 0.5 % ON-Q PUMP SINGLE CATH 400 ML
INJECTION | Status: DC | PRN
  Administered 2013-06-16: 400 mL

## 2013-06-16 MED ORDER — FENTANYL CITRATE 0.05 MG/ML IJ SOLN
INTRAMUSCULAR | Status: DC | PRN
  Administered 2013-06-16 (×4): 50 ug via INTRAVENOUS
  Administered 2013-06-16: 150 ug via INTRAVENOUS
  Administered 2013-06-16: 100 ug via INTRAVENOUS
  Administered 2013-06-16: 50 ug via INTRAVENOUS

## 2013-06-16 MED ORDER — OXYCODONE-ACETAMINOPHEN 5-325 MG PO TABS
ORAL_TABLET | ORAL | Status: AC
  Filled 2013-06-16: qty 1

## 2013-06-16 MED ORDER — ONDANSETRON HCL 4 MG/2ML IJ SOLN
4.0000 mg | Freq: Four times a day (QID) | INTRAMUSCULAR | Status: DC | PRN
  Administered 2013-06-16: 4 mg via INTRAVENOUS

## 2013-06-16 MED ORDER — ROCURONIUM BROMIDE 100 MG/10ML IV SOLN
INTRAVENOUS | Status: DC | PRN
  Administered 2013-06-16: 10 mg via INTRAVENOUS
  Administered 2013-06-16: 50 mg via INTRAVENOUS
  Administered 2013-06-16: 10 mg via INTRAVENOUS

## 2013-06-16 MED ORDER — BISACODYL 5 MG PO TBEC
10.0000 mg | DELAYED_RELEASE_TABLET | Freq: Every day | ORAL | Status: DC
  Administered 2013-06-16 – 2013-06-23 (×7): 10 mg via ORAL
  Filled 2013-06-16 (×9): qty 2

## 2013-06-16 MED ORDER — NALOXONE HCL 0.4 MG/ML IJ SOLN
0.4000 mg | INTRAMUSCULAR | Status: DC | PRN
  Filled 2013-06-16: qty 1

## 2013-06-16 MED ORDER — PANTOPRAZOLE SODIUM 40 MG PO TBEC
40.0000 mg | DELAYED_RELEASE_TABLET | Freq: Every day | ORAL | Status: DC
  Administered 2013-06-16 – 2013-06-24 (×9): 40 mg via ORAL
  Filled 2013-06-16 (×8): qty 1

## 2013-06-16 MED ORDER — SIMVASTATIN 10 MG PO TABS
10.0000 mg | ORAL_TABLET | Freq: Every day | ORAL | Status: DC
  Administered 2013-06-16 – 2013-06-24 (×8): 10 mg via ORAL
  Filled 2013-06-16 (×10): qty 1

## 2013-06-16 MED ORDER — TRAMADOL HCL 50 MG PO TABS
50.0000 mg | ORAL_TABLET | Freq: Four times a day (QID) | ORAL | Status: DC | PRN
  Administered 2013-06-18 – 2013-06-21 (×4): 100 mg via ORAL
  Administered 2013-06-23 (×2): 50 mg via ORAL
  Filled 2013-06-16 (×2): qty 2
  Filled 2013-06-16 (×2): qty 1
  Filled 2013-06-16 (×2): qty 2

## 2013-06-16 MED ORDER — SENNOSIDES-DOCUSATE SODIUM 8.6-50 MG PO TABS
1.0000 | ORAL_TABLET | Freq: Every evening | ORAL | Status: DC | PRN
  Filled 2013-06-16: qty 1

## 2013-06-16 MED ORDER — ONDANSETRON HCL 4 MG/2ML IJ SOLN
INTRAMUSCULAR | Status: DC | PRN
  Administered 2013-06-16: 4 mg via INTRAVENOUS

## 2013-06-16 MED ORDER — OXYCODONE-ACETAMINOPHEN 5-325 MG PO TABS
1.0000 | ORAL_TABLET | ORAL | Status: DC | PRN
  Administered 2013-06-16 – 2013-06-23 (×2): 1 via ORAL
  Filled 2013-06-16: qty 2
  Filled 2013-06-16: qty 1

## 2013-06-16 MED ORDER — LACTATED RINGERS IV SOLN
INTRAVENOUS | Status: DC | PRN
  Administered 2013-06-16: 07:00:00 via INTRAVENOUS

## 2013-06-16 MED ORDER — SODIUM CHLORIDE 0.9 % IJ SOLN: 9.0000 mL | INTRAMUSCULAR | Status: DC | PRN

## 2013-06-16 MED ORDER — POTASSIUM CHLORIDE 10 MEQ/50ML IV SOLN: 10.0000 meq | Freq: Every day | INTRAVENOUS | Status: DC | PRN

## 2013-06-16 MED ORDER — VITAMIN D3 25 MCG (1000 UNIT) PO TABS
1000.0000 [IU] | ORAL_TABLET | Freq: Every day | ORAL | Status: DC
  Administered 2013-06-16 – 2013-06-24 (×9): 1000 [IU] via ORAL
  Filled 2013-06-16 (×10): qty 1

## 2013-06-16 MED ORDER — 0.9 % SODIUM CHLORIDE (POUR BTL) OPTIME
TOPICAL | Status: DC | PRN
  Administered 2013-06-16: 3000 mL

## 2013-06-16 MED ORDER — EPHEDRINE SULFATE 50 MG/ML IJ SOLN
INTRAMUSCULAR | Status: DC | PRN
  Administered 2013-06-16 (×4): 10 mg via INTRAVENOUS

## 2013-06-16 MED ORDER — DEXTROSE 5 % IV SOLN
1.5000 g | Freq: Two times a day (BID) | INTRAVENOUS | Status: AC
  Administered 2013-06-16 – 2013-06-17 (×2): 1.5 g via INTRAVENOUS
  Filled 2013-06-16 (×2): qty 1.5

## 2013-06-16 MED ORDER — ONDANSETRON HCL 4 MG/2ML IJ SOLN
4.0000 mg | Freq: Four times a day (QID) | INTRAMUSCULAR | Status: DC | PRN
  Filled 2013-06-16: qty 2

## 2013-06-16 SURGICAL SUPPLY — 98 items
ADH SKN CLS APL DERMABOND .7 (GAUZE/BANDAGES/DRESSINGS) ×2
APL SRG 22X2 LUM MLBL SLNT (VASCULAR PRODUCTS)
APL SRG 7X2 LUM MLBL SLNT (VASCULAR PRODUCTS)
APPLICATOR TIP COSEAL (VASCULAR PRODUCTS) IMPLANT
APPLICATOR TIP EXT COSEAL (VASCULAR PRODUCTS) IMPLANT
BAG RETRIEVAL 10 (BASKET) ×1
BLADE SURG 11 STRL SS (BLADE) IMPLANT
BRUSH CYTOL CELLEBRITY 1.5X140 (MISCELLANEOUS) IMPLANT
CANISTER SUCTION 2500CC (MISCELLANEOUS) ×3 IMPLANT
CATH KIT ON Q 5IN SLV (PAIN MANAGEMENT) ×1 IMPLANT
CATH THORACIC 28FR (CATHETERS) IMPLANT
CATH THORACIC 36FR (CATHETERS) IMPLANT
CATH THORACIC 36FR RT ANG (CATHETERS) IMPLANT
CLIP TI MEDIUM 6 (CLIP) IMPLANT
CONN 3/8X3/8 GISH STERILE (MISCELLANEOUS) ×1 IMPLANT
CONN ST 1/4X3/8  BEN (MISCELLANEOUS) ×1
CONN ST 1/4X3/8 BEN (MISCELLANEOUS) IMPLANT
CONN Y 3/8X3/8X3/8  BEN (MISCELLANEOUS)
CONN Y 3/8X3/8X3/8 BEN (MISCELLANEOUS) IMPLANT
CONT SPEC 4OZ CLIKSEAL STRL BL (MISCELLANEOUS) ×13 IMPLANT
COVER TABLE BACK 60X90 (DRAPES) ×3 IMPLANT
DERMABOND ADVANCED (GAUZE/BANDAGES/DRESSINGS) ×1
DERMABOND ADVANCED .7 DNX12 (GAUZE/BANDAGES/DRESSINGS) IMPLANT
DRAIN CHANNEL 32F RND 10.7 FF (WOUND CARE) ×1 IMPLANT
DRAPE LAPAROSCOPIC ABDOMINAL (DRAPES) ×3 IMPLANT
DRAPE WARM FLUID 44X44 (DRAPE) ×3 IMPLANT
DRILL BIT 7/64X5 (BIT) ×1 IMPLANT
DRSG AQUACEL AG ADV 3.5X14 (GAUZE/BANDAGES/DRESSINGS) ×3 IMPLANT
ELECT BLADE 4.0 EZ CLEAN MEGAD (MISCELLANEOUS) ×3
ELECT REM PT RETURN 9FT ADLT (ELECTROSURGICAL) ×3
ELECTRODE BLDE 4.0 EZ CLN MEGD (MISCELLANEOUS) ×2 IMPLANT
ELECTRODE REM PT RTRN 9FT ADLT (ELECTROSURGICAL) ×2 IMPLANT
FORCEPS BIOP RJ4 1.8 (CUTTING FORCEPS) IMPLANT
GLOVE BIO SURGEON STRL SZ 6.5 (GLOVE) ×7 IMPLANT
GLOVE BIOGEL PI IND STRL 7.0 (GLOVE) IMPLANT
GLOVE BIOGEL PI INDICATOR 7.0 (GLOVE) ×1
GOWN PREVENTION PLUS XLARGE (GOWN DISPOSABLE) ×2 IMPLANT
GOWN STRL NON-REIN LRG LVL3 (GOWN DISPOSABLE) ×12 IMPLANT
HANDLE STAPLE ENDO GIA SHORT (STAPLE) ×1
KIT BASIN OR (CUSTOM PROCEDURE TRAY) ×3 IMPLANT
KIT ROOM TURNOVER OR (KITS) ×3 IMPLANT
KIT SUCTION CATH 14FR (SUCTIONS) ×3 IMPLANT
MARKER SKIN DUAL TIP RULER LAB (MISCELLANEOUS) ×3 IMPLANT
NDL BIOPSY TRANSBRONCH 21G (NEEDLE) IMPLANT
NEEDLE BIOPSY TRANSBRONCH 21G (NEEDLE) IMPLANT
NS IRRIG 1000ML POUR BTL (IV SOLUTION) ×6 IMPLANT
OIL SILICONE PENTAX (PARTS (SERVICE/REPAIRS)) ×3 IMPLANT
PACK CHEST (CUSTOM PROCEDURE TRAY) ×3 IMPLANT
PAD ARMBOARD 7.5X6 YLW CONV (MISCELLANEOUS) ×6 IMPLANT
PASSER SUT SWANSON 36MM LOOP (INSTRUMENTS) ×1 IMPLANT
RELOAD EGIA 45 TAN VASC (STAPLE) ×1 IMPLANT
RELOAD EGIA 60 MED/THCK PURPLE (STAPLE) ×6 IMPLANT
RELOAD EGIA BLACK ROTIC 45MM (STAPLE) ×6 IMPLANT
RELOAD EGIA TRIS TAN 45 CVD (STAPLE) ×6 IMPLANT
RELOAD STAPLE 45 BLK XTHK (STAPLE) IMPLANT
RELOAD STAPLE 45 TAN MED CVD (STAPLE) IMPLANT
RELOAD STAPLE 60 MED/THCK ART (STAPLE) IMPLANT
SCISSORS LAP 5X35 DISP (ENDOMECHANICALS) IMPLANT
SEALANT PROGEL (MISCELLANEOUS) IMPLANT
SEALANT SURG COSEAL 4ML (VASCULAR PRODUCTS) IMPLANT
SEALANT SURG COSEAL 8ML (VASCULAR PRODUCTS) ×1 IMPLANT
SOLUTION ANTI FOG 6CC (MISCELLANEOUS) ×3 IMPLANT
SPONGE GAUZE 4X4 12PLY (GAUZE/BANDAGES/DRESSINGS) ×3 IMPLANT
STAPLER ENDO GIA 12 SHRT THIN (STAPLE) IMPLANT
STAPLER ENDO GIA 12MM SHORT (STAPLE) ×2 IMPLANT
SUT PROLENE 3 0 SH DA (SUTURE) IMPLANT
SUT PROLENE 4 0 RB 1 (SUTURE) ×3
SUT PROLENE 4-0 RB1 .5 CRCL 36 (SUTURE) IMPLANT
SUT SILK  1 MH (SUTURE) ×6
SUT SILK 1 MH (SUTURE) ×4 IMPLANT
SUT SILK 2 0 SH (SUTURE) IMPLANT
SUT SILK 2 0SH CR/8 30 (SUTURE) IMPLANT
SUT SILK 3 0SH CR/8 30 (SUTURE) IMPLANT
SUT STEEL 1 (SUTURE) IMPLANT
SUT VIC AB 1 CTX 18 (SUTURE) ×1 IMPLANT
SUT VIC AB 1 CTX 36 (SUTURE)
SUT VIC AB 1 CTX36XBRD ANBCTR (SUTURE) IMPLANT
SUT VIC AB 2-0 CTX 36 (SUTURE) ×1 IMPLANT
SUT VIC AB 2-0 UR6 27 (SUTURE) IMPLANT
SUT VIC AB 3-0 SH 8-18 (SUTURE) IMPLANT
SUT VIC AB 3-0 X1 27 (SUTURE) ×1 IMPLANT
SUT VICRYL 2 TP 1 (SUTURE) ×1 IMPLANT
SWAB COLLECTION DEVICE MRSA (MISCELLANEOUS) IMPLANT
SYR 20ML ECCENTRIC (SYRINGE) ×3 IMPLANT
SYS BAG RETRIEVAL 10MM (BASKET) ×2
SYSTEM BAG RETRIEVAL 10MM (BASKET) IMPLANT
SYSTEM SAHARA CHEST DRAIN ATS (WOUND CARE) ×3 IMPLANT
TAPE CLOTH SURG 4X10 WHT LF (GAUZE/BANDAGES/DRESSINGS) ×1 IMPLANT
TIP APPLICATOR SPRAY EXTEND 16 (VASCULAR PRODUCTS) IMPLANT
TOWEL OR 17X24 6PK STRL BLUE (TOWEL DISPOSABLE) ×6 IMPLANT
TOWEL OR 17X26 10 PK STRL BLUE (TOWEL DISPOSABLE) ×6 IMPLANT
TOWEL OR NON WOVEN STRL DISP B (DISPOSABLE) ×1 IMPLANT
TRAP SPECIMEN MUCOUS 40CC (MISCELLANEOUS) ×3 IMPLANT
TRAY FOLEY CATH 14FRSI W/METER (CATHETERS) ×3 IMPLANT
TUBE ANAEROBIC SPECIMEN COL (MISCELLANEOUS) IMPLANT
TUBE CONNECTING 12X1/4 (SUCTIONS) ×6 IMPLANT
TUNNELER SHEATH ON-Q 11GX8 DSP (PAIN MANAGEMENT) ×4 IMPLANT
WATER STERILE IRR 1000ML POUR (IV SOLUTION) ×6 IMPLANT

## 2013-06-16 NOTE — Anesthesia Postprocedure Evaluation (Signed)
  Anesthesia Post-op Note  Patient: [name]  Procedure(s) Performed: Procedure(s): VIDEO BRONCHOSCOPY (N/A) VIDEO ASSISTED THORACOSCOPY (VATS)/WEDGE RESECTION (Right)  Patient Location: PACU  Anesthesia Type:General  Level of Consciousness: awake, oriented, sedated and patient cooperative  Airway and Oxygen Therapy: Patient Spontanous Breathing  Post-op Pain: mild  Post-op Assessment: Post-op Vital signs reviewed, Patient's Cardiovascular Status Stable, Respiratory Function Stable, Patent Airway, No signs of Nausea or vomiting, Adequate PO intake and Pain level controlled  Post-op Vital Signs: stable  Complications: No apparent anesthesia complications

## 2013-06-16 NOTE — Progress Notes (Signed)
[facility] BRIEF SICU PROGRESS NOTE  Day of Surgery  S/P Procedure(s) (LRB): VIDEO BRONCHOSCOPY (N/A) VIDEO ASSISTED THORACOSCOPY (VATS)/WEDGE RESECTION (Right)   Resting comfortably in SICU, good analgesia NSR w/ stable BP O2 sats 98% Chest tube output low, no air leak  Plan: Continue routine early postop  Nhia Heaphy H 06/16/2013 5:25 PM

## 2013-06-16 NOTE — Transfer of Care (Signed)
Immediate Anesthesia Transfer of Care Note  Patient: [name]  Procedure(s) Performed: Procedure(s): VIDEO BRONCHOSCOPY (N/A) VIDEO ASSISTED THORACOSCOPY (VATS)/WEDGE RESECTION (Right)  Patient Location: PACU  Anesthesia Type:General  Level of Consciousness: awake, alert  and oriented  Airway & Oxygen Therapy: Patient Spontanous Breathing  Post-op Assessment: Report given to PACU RN  Post vital signs: Reviewed and stable  Complications: No apparent anesthesia complications

## 2013-06-16 NOTE — Brief Op Note (Addendum)
      [address]       [place] [address]             [phone]      06/16/2013  11:47 AM  PATIENT:  Tonye Becket  79 y.o. female  PRE-OPERATIVE DIAGNOSIS:  RIGHT UPPER LOBE MASS  POST-OPERATIVE DIAGNOSIS:  NON-SMALL CELL CARCINOMA, RIGHT UPPER LOBE  PROCEDURE:   VIDEO BRONCHOSCOPY  RIGHT VIDEO ASSISTED THORACOSCOPY/MINI-THORACOTOMY, RIGHT UPPER LOBECTOMY, LYMPH NODE DISSECTION  Placement of ON Q   SURGEON:  Delight Ovens, MD  ASSISTANT: Coral Ceo, PA-C  ANESTHESIA:   general  SPECIMEN:  Source of Specimen:  right upper lobe, mediastinal lymph nodes  DISPOSITION OF SPECIMEN:  Pathology  DRAINS: 28 Fr CT, 32 Fr Bard   PATIENT CONDITION:  PACU - hemodynamically stable. no heparin dur to risk of bleeding

## 2013-06-16 NOTE — H&P (Signed)
[address]       [place] [address]             [phone]                        ANNELISA RYBACK [facility] Medical Record #[id number] Date of Birth: October 19, 1927  Referring: Nyoka Cowden, MD Primary Care: Florentina Jenny, MD  Chief Complaint:    Chief Complaint  Patient presents with  . Lung Mass    Surgical eval on Lung mass, Chest CT 03/11/13, PET scan 04/30/13, Path 04/10/13, PFT's 05/13/13    History of Present Illness:    Patient is an 79 year old female referred by Dr. Sherene Sires for a 3.5 cm right upper lobe lung mass highly suspicious for carcinoma the lung. The patient had a chest x-ray in July of 2013 which commented on possible right upper lung lesion. Subsequent chest x-ray almost a year later showed this area to be more prominent. A CT scan of the chest was performed leading to a PET scan and bronchoscopy. The patient developed shortness of breath and pulmonary bleed following bronchoscopy and was hospitalized for several days ultimately discharged home. The path was negative. She is now referred to thoracic surgery office for evaluation of treatment options.  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 The patient's granddaughter notes that she does get short of breath with exertion but is able to walk around the grocery store with assistance. In larger stores she usually rides a cart. The patient has been a long-term smoker more than 50 years and quit in 2011.     Current Activity/ Functional Status:  Patient is independent with mobility/ambulation, transfers, ADL's, IADL's.  Zubrod Score: At the time of surgery this patient's most appropriate activity status/level should be described as: []  Normal activity, no symptoms []  Symptoms, fully ambulatory [x]  Symptoms, in bed less than  or equal to 50% of the time []  Symptoms, in bed greater than 50% of the time but less than 100% []  Bedridden []  Moribund   Past Medical History  Diagnosis Date  . Stroke   . Arthritis   . Hypertension   . Hyperlipidemia   . Glaucoma   . Peripheral vascular disease, unspecified     with Claudication  . COPD GOLD I 04/18/2013  . Cancer     rt lung  . Shortness of breath     Past Surgical History  Procedure Laterality Date  . Above knee leg amputation- [place]  1993    Right  . Angioplasty / stenting iliac  03/22/09    Aortogram-  left common iliac artery  . Hemorrhoid surgery    . Tonsillectomy    . Colonoscopy  Feb. 2013  . Video bronchoscopy Bilateral 04/10/2013    Procedure: VIDEO BRONCHOSCOPY WITH FLUORO;  Surgeon: Nyoka Cowden, MD;  Location: [facility];  Service: Cardiopulmonary;  Laterality: Bilateral;    Family History  Problem Relation Age of Onset  . Stroke Brother   . Stomach cancer Mother   . Asthma Sister   . Colon cancer Brother     History   Social History  . Marital Status: Widowed    Spouse Name: N/A    Number of Children: 1  . Years of Education: N/A  Occupational History  . 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 Smoking status: Former Smoker -- 0.50 packs/day for 60 years    Types: Cigarettes    Quit date: 07/31/2009  . Smokeless tobacco: Never Used  . Alcohol Use: No  . Drug Use: No  . Sexual Activity: No          Social History Narrative   Pt lives alone. She is HOH, especially left ear.    History  Smoking status  . Former Smoker -- 0.50 packs/day for 60 years  . Types: Cigarettes  . Quit date: 07/31/2009  Smokeless tobacco  . Never Used    History  Alcohol Use No     Allergies  Allergen Reactions  . Aspirin Anaphylaxis  . Morphine And Related Other (See Comments)    confusion    Current Facility-Administered Medications    Medication Dose Route Frequency Provider Last Rate Last Dose  . cefUROXime (ZINACEF) 1.5 g in dextrose 5 % 50 mL IVPB  1.5 g Intravenous 60 min Pre-Op Delight Ovens, MD       Facility-Administered Medications Ordered in Other Encounters  Medication Dose Route Frequency Provider Last Rate Last Dose  . lactated ringers infusion    Continuous PRN Dairl Ponder, CRNA           Review of Systems:     Cardiac Review of Systems: Y or N  Chest Pain [   n ]  Resting SOB [n   ] Exertional SOB  Cove.Etienne  ]  Orthopnea [ n ]   Pedal Edema [ n  ]    Palpitations [n  ] Syncope  [ n ]   Presyncope [  y ]  General Review of Systems: [Y] = yes [  ]=no Constitional: recent weight change [ n ]; anorexia [  n]; fatigue [ y ]; nausea [n  ]; night sweats [ n ]; fever [ n ]; or chills [n  ];                                                                                                                                          Dental: poor dentition[ n ]; Last Dentist visit:   Eye : blurred vision [  ]; diplopia [   ]; vision changes [ n ];  Amaurosis fugax[n  ]; history of 3 tis but does not remember symptoms Resp: cough [ y ];  wheezing[n  ];  hemoptysis[n  ]; shortness of breath[y  ]; paroxysmal nocturnal dyspnea[n  ]; dyspnea on exertion[  y]; or orthopnea[  ];  GI:  gallstones[  ], vomiting[ n ];  dysphagia[  ]; melena[  ];  hematochezia [ n ]; heartburn[ n ];   Hx of  Colonoscopy[  ]; GU: kidney stones [  ];  hematuria[  ];   dysuria [  ];  nocturia[  ];  history of     obstruction [  ]; urinary frequency [  ]             Skin: rash, swelling[  ];, hair loss[  ];  peripheral edema[  ];  or itching[  ]; Musculosketetal: myalgias[  ];  joint swelling[  ];  joint erythema[  ];  joint pain[  ];  back pain[  ];  Heme/Lymph: bruising[  ];  bleeding[  ];  anemia[  ];  Neuro: TIA[  ];  headaches[  ];  stroke[  ];  vertigo[  ];  seizures[ n ];   paresthesias[  ];  difficulty walking[ y ];  Psych:depression[  ];  anxiety[  ];  Endocrine: diabetes[ n ];  thyroid dysfunction[n  ];  Immunizations: Flu [ y ]; Pneumococcal[n  ];  Other:  Physical Exam: BP 161/67  Pulse 69  Temp(Src) 97 F (36.1 C) (Oral)  Resp 20  SpO2 99%  General appearance: alert, cooperative and appears stated age Neurologic: intact Heart: regular rate and rhythm, S1, S2 normal, no murmur, click, rub or gallop Lungs: clear to auscultation bilaterally Abdomen: soft, non-tender; bowel sounds normal; no masses,  no organomegaly Extremities: extremities normal, atraumatic, no cyanosis or edema and rt aka, no pedal pulses on left, no carotid bruits Patient has no cervical supraclavicular or axillary adenopathy   Diagnostic Studies & Laboratory data:     Recent Radiology Findings:    Dg Chest 2 View  06/12/2013   CLINICAL DATA:  Preoperative evaluation for right lung surgery  EXAM: CHEST  2 VIEW  COMPARISON:  05/29/2013  FINDINGS: Persistent right upper lobe mass lesion is identified. The cardiac shadow is within normal limits. The lungs are otherwise clear. No acute bony abnormality is noted.  IMPRESSION: Stable right upper lobe mass.   Electronically Signed   By: Alcide Clever M.D.   On: 06/12/2013 17:00   Ct Biopsy  05/29/2013   CLINICAL DATA:  3.4 CM RIGHT UPPER LOBE SPICULATED MASS  EXAM: CT GUIDED NEEDLE CORE BIOPSY OF RIGHT UPPER LOBE MASS  ANESTHESIA/SEDATION: 2  mg IV Versed; 75 mcg IV Fentanyl  Total Moderate Sedation Time: 15 minutes.  PROCEDURE: The procedure risks, benefits, and alternatives were explained to the patient. Questions regarding the procedure were encouraged and answered. The patient understands and consents to the procedure.  The right anterior chest was prepped with betadinein a sterile fashion, and a sterile drape was applied covering the operative field. A sterile gown and sterile gloves were used for the procedure. Local anesthesia was provided with 1% Lidocaine.  Patient was positioned supine.  Noncontrast localization CT performed. Right upper lobe anterior mass was localized. Under sterile conditions and local anesthesia, CT guidance was utilized to advance a 17 gauge 11.8 cm access needle from an anterior approach. Needle position confirmed within the lesion by CT. 18 gauge core biopsies obtained and placed in formalin. No immediate complication. Needle tract was embolized with a blood patch. No effusion or pneumothorax following needle removal. Patient tolerated the biopsy well.  Complications: None immediate  FINDINGS: CT imaging confirms needle placed in in the anterior right upper lobe mass.  IMPRESSION: Successful CT-guided right upper lobe mass 18 gauge core biopsy   Electronically Signed   By: Ruel Favors M.D.   On: 05/29/2013 13:28 Mr Laqueta Jean AV Contrast  05/14/2013   CLINICAL DATA:  Lung cancer. Question  intracranial metastatic disease. Hypertension and hyperlipidemia.  EXAM: MRI HEAD WITHOUT AND WITH CONTRAST  TECHNIQUE: Multiplanar, multiecho pulse sequences of the brain and surrounding structures were obtained according to standard protocol without and with intravenous contrast  CONTRAST:  15 cc MultiHance.  COMPARISON:  03/21/2012.  FINDINGS: No acute infarct.  No intracranial hemorrhage.  Incidentally noted is a small developmental venous anomaly right cerebellum.  No intracranial enhancing lesion or bony destructive lesion to suggest the presence of intracranial metastatic disease.  Partial opacification left mastoid air cells without obstructing lesion noted in the posterior superior nasopharynx. Minimal to mild paranasal sinus scratch at minimal paranasal sinus mucosal thickening.  Major intracranial vascular structures are patent.  Moderate small vessel disease type changes.  Global atrophy without hydrocephalus.  Cervical spondylotic changes and spinal stenosis C3-4 and C4-5.  Orbital structures unremarkable.  IMPRESSION: No evidence of intracranial metastatic disease. Please see  above.   Electronically Signed   By: Bridgett Larsson M.D.   On: 05/14/2013 12:32   Nm Pet Image Initial (pi) Skull Base To Thigh  04/30/2013   CLINICAL DATA:  Initial treatment strategy for pulmonary nodule.  EXAM: NUCLEAR MEDICINE PET SKULL BASE TO THIGH  FASTING BLOOD GLUCOSE:  Value:  95 mg/dl  TECHNIQUE: 16.1 mCi W-96 FDG was injected intravenously. CT data was obtained and used for attenuation correction and anatomic localization only. (This was not acquired as a diagnostic CT examination.) Additional exam technical data entered on technologist worksheet.  COMPARISON:  CT thorax 03/11/2013  FINDINGS: NECK  No hypermetabolic lymph nodes in the neck.  CHEST  Within the right upper lobe there is a hypermetabolic spiculated mass measuring 34 x 28 mm with SUV max = 23.5. No additional hypermetabolic nodules are identified. There is a single hypermetabolic right hilar lymph node which is difficult to define on the CT portion but felt to correlate to a 12 mm node on image 77 with SUV max = max 7.4. No discrete hypermetabolic mediastinal lymph nodes.  ABDOMEN/PELVIS  No abnormal hypermetabolic activity within the liver, pancreas, adrenal glands, or spleen. No hypermetabolic lymph nodes in the abdomen or pelvis.  SKELETON  No focal hypermetabolic activity to suggest skeletal metastasis.  IMPRESSION: Hypermetabolic right upper lobe mass without right hilar nodal metastasis. Staging by FDG PET/CT imaging T2a N1 M0 .   Electronically Signed   By: Genevive Bi M.D.   On: 04/30/2013 16:09   RADIOLOGY REPORT*  02/15/2012 Clinical Data: Shortness of breath and weakness.  CHEST - 2 VIEW  Comparison: 01/29/2009.  Findings: Trachea is midline. Heart size normal. Thoracic aorta  is calcified. There is added density in the apex of the right  hemithorax, in the region of the right first costochondral  junction, more prominent than on 01/29/2009. Minimal scarring at  the left lung base. Lungs are otherwise clear. No  pleural fluid.  Degenerative changes are seen in the mid thoracic spine.  IMPRESSION:  Added density in the region of the right first costochondral  junction, increased in prominence from 01/29/2009. In this patient  with a smoking history, non emergent of CT chest without contrast  would be helpful in further evaluation, as clinically indicated, as  a pulmonary nodule cannot be excluded. These results will be called  to the ordering clinician or representative by the Radiologist  Assistant, and communication documented in the PACS Dashboard.  Original Report Authenticated By: Reyes Ivan, M.D.   RADIOLOGY REPORT*  Clinical Data: Cough and shortness of breath.  Known apical mass.  CHEST - 2 VIEW  Comparison: 02/15/2012.  Findings: The cardiac silhouette, mediastinal and hilar contours  are normal and stable. Mild tortuosity and calcification of the  thoracic aorta. The pulmonary hila appear normal. There is a  right apical lung lesion worrisome for neoplasm and definitely  progressive since prior chest x-ray. No infiltrates or effusions.  The bony thorax is intact.  IMPRESSION:  Right apical lesion. Recommend chest CT for further evaluation.  No acute infiltrates or effusions.  Original Report Authenticated By: Rudie Meyer, M.D.      RADIOLOGY REPORT*  Clinical Data: Enlarging right apical mass on recent chest  radiographs. Cough with night sweats and hoarseness.  CT CHEST WITH CONTRAST  Technique: Multidetector CT imaging of the chest was performed  following the standard protocol during bolus administration of  intravenous contrast.  Contrast: OMNIPAQUE IOHEXOL 300 MG/ML SOLN  Comparison: Chest radiographs 03/05/2013 and 02/15/2012.  Findings: Corresponding with the radiographic finding is a  spiculated right apical mass measuring 3.6 x 2.9 x 2.0 cm  consistent with bronchogenic carcinoma. This lesion demonstrates  no chest wall involvement. No other suspicious  pulmonary nodules  demonstrated. There is mild centrilobular emphysema.  Small AP window and right hilar lymph nodes are not pathologically  enlarged. There is no pleural or pericardial effusion.  There is mild atherosclerosis of the aorta, great vessels and  coronary arteries. No suspicious findings are present within the  upper abdomen. There are small calcified splenic granulomas.  There is mild reflux of contrasted blood into the IVC and hepatic  veins. There are no worrisome osseous findings.  IMPRESSION:  1. 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 Tissue sampling recommended.  2. No evidence of metastatic disease.  3. Mild centrilobular emphysema.  Original Report Authenticated By: Carey Bullocks, M.D.   Recent Lab Findings: Lab Results  Component Value Date   WBC 6.7 06/12/2013   HGB 13.5 06/12/2013   HCT 40.4 06/12/2013   PLT 243 06/12/2013   GLUCOSE 103* 06/12/2013   ALT 9 06/12/2013   AST 17 06/12/2013   NA 138 06/12/2013   K 3.8 06/12/2013   CL 104 06/12/2013   CREATININE 0.87 06/12/2013   BUN 13 06/12/2013   CO2 23 06/12/2013   INR 1.02 06/12/2013   PFT's ;Attempted DLCO but unable to do, FEV1 1.1 82% - 6% increase with bronchiodilators Walked  in office more then 5 min- became SOB but with  O2 sat 98% on RA  Assessment / Plan:   Clinicial cT2a,cN1,cM0 Carcinoma of  the right Upper Lung Limitied mobility secondary to rt leg ambulation and peripherial vascular disease Mild COPD, unable to do DLCO MRI of the brain shows no evidence of metastatic disease Patient case was seen at the multidisciplinary thoracic oncology conference last week, treatment options of high risk surgical resection versus biopsy with needle or ENB were discussed with chemo radiation were discussed. The options were again discussed with the patient and her grandaughter.  After multiple visits and review with the patient and her family, the patient wishes to  proceed with surgical resection. She understands there is some increase surgical risk because of her age, but is willing to proceed.   The goals risks and alternatives of the planned surgical procedure bronchoscopy, VATS, Lung Resection  have been discussed with the patient in detail. The risks of the procedure including death, infection, stroke, myocardial infarction, bleeding, blood transfusion have all been discussed specifically.  I have quoted SHARAYA BORUFF a 5 % of perioperative mortality and a complication rate as high as 30%. The patient's questions have been answered.RAYEN PALEN is willing  to proceed with the planned procedure.   Delight Ovens MD   [address] [place] [place] [address] Office [phone]   Beeper [phone]  06/16/2013 7:19 AM

## 2013-06-16 NOTE — Anesthesia Preprocedure Evaluation (Addendum)
Anesthesia Evaluation  Patient identified by MRN, date of birth, ID band Patient awake    Reviewed: Allergy & Precautions, H&P , NPO status , Patient's Chart, lab work & pertinent test results  Airway Mallampati: I TM Distance: >3 FB Neck ROM: Full    Dental  (+) Edentulous Upper and Edentulous Lower   Pulmonary COPDCurrent Smoker, former smoker,  breath sounds clear to auscultation        Cardiovascular hypertension, + Peripheral Vascular Disease Rhythm:Regular Rate:Normal     Neuro/Psych CVA    GI/Hepatic   Endo/Other    Renal/GU      Musculoskeletal   Abdominal Normal abdominal exam  (+)   Peds  Hematology   Anesthesia Other Findings   Reproductive/Obstetrics                          Anesthesia Physical Anesthesia Plan  ASA: III  Anesthesia Plan: General   Post-op Pain Management:    Induction: Intravenous  Airway Management Planned: Double Lumen EBT  Additional Equipment: Arterial line and CVP  Intra-op Plan:   Post-operative Plan: Extubation in OR and Possible Post-op intubation/ventilation  Informed Consent: I have reviewed the patients History and Physical, chart, labs and discussed the procedure including the risks, benefits and alternatives for the proposed anesthesia with the patient or authorized representative who has indicated his/her understanding and acceptance.     Plan Discussed with: CRNA, Anesthesiologist and Surgeon  Anesthesia Plan Comments:        Anesthesia Quick Evaluation

## 2013-06-17 ENCOUNTER — Encounter: Payer: Self-pay | Admitting: *Deleted

## 2013-06-17 ENCOUNTER — Inpatient Hospital Stay (HOSPITAL_COMMUNITY): Payer: Medicare Other

## 2013-06-17 LAB — CBC
HCT: 35.8 % — ABNORMAL LOW (ref 36.0–46.0)
Hemoglobin: 12.1 g/dL (ref 12.0–15.0)
MCH: 30.1 pg (ref 26.0–34.0)
MCHC: 33.8 g/dL (ref 30.0–36.0)
MCV: 89.1 fL (ref 78.0–100.0)
Platelets: 234 10*3/uL (ref 150–400)
RBC: 4.02 MIL/uL (ref 3.87–5.11)
RDW: 13.7 % (ref 11.5–15.5)
WBC: 7.8 10*3/uL (ref 4.0–10.5)

## 2013-06-17 LAB — POCT I-STAT 3, ART BLOOD GAS (G3+)
Acid-base deficit: 4 mmol/L — ABNORMAL HIGH (ref 0.0–2.0)
Bicarbonate: 20.6 mEq/L (ref 20.0–24.0)
O2 Saturation: 96 %
Patient temperature: 98
TCO2: 22 mmol/L (ref 0–100)
pCO2 arterial: 35.9 mmHg (ref 35.0–45.0)
pH, Arterial: 7.367 (ref 7.350–7.450)
pO2, Arterial: 86 mmHg (ref 80.0–100.0)

## 2013-06-17 LAB — BASIC METABOLIC PANEL
BUN: 9 mg/dL (ref 6–23)
CO2: 23 mEq/L (ref 19–32)
Calcium: 8.7 mg/dL (ref 8.4–10.5)
Chloride: 106 mEq/L (ref 96–112)
Creatinine, Ser: 0.66 mg/dL (ref 0.50–1.10)
GFR calc Af Amer: >90 mL/min (ref >90)
GFR calc non Af Amer: 79 mL/min — ABNORMAL LOW (ref >90)
Glucose, Bld: 126 mg/dL — ABNORMAL HIGH (ref 70–99)
Potassium: 4 mEq/L (ref 3.5–5.1)
Sodium: 138 mEq/L (ref 135–145)

## 2013-06-17 MED ORDER — FUROSEMIDE 10 MG/ML IJ SOLN
INTRAMUSCULAR | Status: AC
  Administered 2013-06-17: 40 mg via INTRAVENOUS
  Filled 2013-06-17: qty 4

## 2013-06-17 MED ORDER — FUROSEMIDE 10 MG/ML IJ SOLN
40.0000 mg | Freq: Once | INTRAMUSCULAR | Status: AC
  Administered 2013-06-17: 40 mg via INTRAVENOUS

## 2013-06-17 MED ORDER — ENOXAPARIN SODIUM 30 MG/0.3ML ~~LOC~~ SOLN
30.0000 mg | SUBCUTANEOUS | Status: DC
  Administered 2013-06-17 – 2013-06-24 (×8): 30 mg via SUBCUTANEOUS
  Filled 2013-06-17 (×9): qty 0.3

## 2013-06-17 NOTE — Progress Notes (Signed)
Patient ID: [name], female   DOB: 01-20-1928, 79 y.o.   MRN: [id number] [facility] DAILY ICU PROGRESS NOTE                   [address]            [place] [address]          [phone]   1 Day Post-Op Procedure(s) (LRB): VIDEO BRONCHOSCOPY (N/A) VIDEO ASSISTED THORACOSCOPY (VATS)/WEDGE RESECTION (Right)  Total Length of Stay:  LOS: 1 day   Subjective: Up to chair, awake and alert good pain control  Objective: Vital signs in last 24 hours: Temp:  [95.6 F (35.3 C)-98.2 F (36.8 C)] 98.1 F (36.7 C) (11/18 0723) Pulse Rate:  [65-83] 73 (11/18 0800) Cardiac Rhythm:  [-] Normal sinus rhythm (11/18 0400) Resp:  [12-22] 16 (11/18 0800) BP: (101-172)/(41-110) 101/42 mmHg (11/18 0800) SpO2:  [76 %-100 %] 98 % (11/18 0927) Arterial Line BP: (126-214)/(42-86) 126/59 mmHg (11/18 0800) Weight:  [150 lb 5.7 oz (68.2 kg)-155 lb 10.3 oz (70.6 kg)] 155 lb 10.3 oz (70.6 kg) (11/18 0723)  Filed Weights   06/16/13 1530 06/17/13 0723  Weight: 150 lb 5.7 oz (68.2 kg) 155 lb 10.3 oz (70.6 kg)    Weight change:    Hemodynamic parameters for last 24 hours:    Intake/Output from previous day: 11/17 0701 - 11/18 0700 In: 3202 [P.O.:120; I.V.:3032; IV Piggyback:50] Out: 1796 [Urine:1220; Blood:200; Chest Tube:376]  Intake/Output this shift: Total I/O In: 100 [I.V.:100] Out: 20 [Urine:20]  Current Meds: Scheduled Meds: . acetaminophen  1,000 mg Oral Q6H   Or  . acetaminophen (TYLENOL) oral liquid 160 mg/5 mL  1,000 mg Oral Q6H  . albuterol  4 puff Inhalation Q6H  . bisacodyl  10 mg Oral Daily  . cholecalciferol  1,000 Units Oral Daily  . cycloSPORINE  1 drop Both Eyes Q12H  . fentaNYL   Intravenous Q4H  . hydrALAZINE  25 mg Oral BID  . pantoprazole  40 mg Oral Daily  . simvastatin  10 mg Oral q1800   Continuous Infusions: . bupivacaine ON-Q pain pump    . dextrose 5 % and 0.45 % NaCl with KCl 20 mEq/L 100 mL/hr (06/17/13 0800)   PRN Meds:.diphenhydrAMINE,  diphenhydrAMINE, naloxone, ondansetron (ZOFRAN) IV, ondansetron (ZOFRAN) IV, oxyCODONE, oxyCODONE-acetaminophen, potassium chloride, senna-docusate, sodium chloride, traMADol  General appearance: alert and cooperative Neurologic: intact Heart: regular rate and rhythm, S1, S2 normal, no murmur, click, rub or gallop Lungs: diminished breath sounds RLL Abdomen: soft, non-tender; bowel sounds normal; no masses,  no organomegaly Extremities: left  extremities normal, atraumatic, no cyanosis or edema and Homans sign is negative, no sign of DVT, has prothesis here to start ambulating with pt Wound: small air leak from ct  Lab Results: CBC: Recent Labs  06/17/13 0441  WBC 7.8  HGB 12.1  HCT 35.8*  PLT 234   BMET:  Recent Labs  06/17/13 0441  NA 138  K 4.0  CL 106  CO2 23  GLUCOSE 126*  BUN 9  CREATININE 0.66  CALCIUM 8.7    PT/INR: No results found for this basename: LABPROT, INR,  in the last 72 hours Radiology: Dg Chest Port 1 View  06/17/2013   CLINICAL DATA:  Status post video bronchoscopy and video-assisted thorascopic the right chest.  EXAM: PORTABLE CHEST - 1 VIEW  COMPARISON:  Chest radiograph 06/16/2013  FINDINGS: Left IJ approach central venous catheter tip projects over the superior  vena cava. There are two right chest tubes, the more cranial tube has been slightly retracted. Stable cardiac and mediastinal contours. Minimal bibasilar atelectasis. No definite large pleural effusion or pneumothorax. Small amount of right chest wall subcutaneous emphysema.  IMPRESSION: Postsurgical changes right hemi thorax. Slight interval retraction of more cranial right chest tube.   Electronically Signed   By: Annia Belt M.D.   On: 06/17/2013 08:00   Dg Chest Portable 1 View  06/16/2013   CLINICAL DATA:  Postop  EXAM: PORTABLE CHEST - 1 VIEW  COMPARISON:  Portable exam 1305 hr compared to 06/12/2013  FINDINGS: New left jugular central venous catheter with tip projecting over SVC.  Pair of  right thoracostomy tubes, cranial most tube of which is coiled.  Upper normal heart size.  Atherosclerotic calcification aortic arch.  Mediastinal contours and pulmonary vascularity normal.  Interval resection of right upper lobe mass.  No gross infiltrate, pleural effusion, or pneumothorax.  IMPRESSION: Postoperative changes as above.   Electronically Signed   By: Ulyses Southward M.D.   On: 06/16/2013 15:26     Assessment/Plan: S/P Procedure(s) (LRB): VIDEO BRONCHOSCOPY (N/A) VIDEO ASSISTED THORACOSCOPY (VATS)/WEDGE RESECTION (Right) Mobilize Diuresis Diabetes control Continue foley due to strict I&O and urinary output monitoring See progression orders     Artyom Stencel B 06/17/2013 9:31 AM

## 2013-06-17 NOTE — Addendum Note (Signed)
Addendum created 06/17/13 1355 by Edmonia Caprio, CRNA   Modules edited: Anesthesia Responsible Staff

## 2013-06-17 NOTE — Progress Notes (Signed)
Patient ID: [name], female   DOB: Aug 17, 1927, 79 y.o.   MRN: [id number]  SICU Evening Rounds:  Hemodynamically stable.  CT air leak present  Urine output good.

## 2013-06-17 NOTE — Progress Notes (Signed)
Visited pt inpt at [facility] today.  Pt is talkative and states she feels good.  Questions and concerns addressed.

## 2013-06-18 ENCOUNTER — Inpatient Hospital Stay (HOSPITAL_COMMUNITY): Payer: Medicare Other

## 2013-06-18 ENCOUNTER — Encounter (HOSPITAL_COMMUNITY): Payer: Self-pay | Admitting: Cardiothoracic Surgery

## 2013-06-18 LAB — CBC
HCT: 38.1 % (ref 36.0–46.0)
Hemoglobin: 12.5 g/dL (ref 12.0–15.0)
MCH: 29.5 pg (ref 26.0–34.0)
MCHC: 32.8 g/dL (ref 30.0–36.0)
MCV: 89.9 fL (ref 78.0–100.0)
Platelets: 207 10*3/uL (ref 150–400)
RBC: 4.24 MIL/uL (ref 3.87–5.11)
RDW: 14 % (ref 11.5–15.5)
WBC: 8.6 10*3/uL (ref 4.0–10.5)

## 2013-06-18 LAB — COMPREHENSIVE METABOLIC PANEL
ALT: 11 U/L (ref 0–35)
AST: 21 U/L (ref 0–37)
Albumin: 2.6 g/dL — ABNORMAL LOW (ref 3.5–5.2)
Alkaline Phosphatase: 43 U/L (ref 39–117)
BUN: 9 mg/dL (ref 6–23)
CO2: 25 mEq/L (ref 19–32)
Calcium: 8.4 mg/dL (ref 8.4–10.5)
Chloride: 102 mEq/L (ref 96–112)
Creatinine, Ser: 0.97 mg/dL (ref 0.50–1.10)
GFR calc Af Amer: 61 mL/min — ABNORMAL LOW (ref >90)
GFR calc non Af Amer: 53 mL/min — ABNORMAL LOW (ref >90)
Glucose, Bld: 107 mg/dL — ABNORMAL HIGH (ref 70–99)
Potassium: 4 mEq/L (ref 3.5–5.1)
Sodium: 136 mEq/L (ref 135–145)
Total Bilirubin: 0.5 mg/dL (ref 0.3–1.2)
Total Protein: 6 g/dL (ref 6.0–8.3)

## 2013-06-18 LAB — CULTURE, RESPIRATORY W GRAM STAIN

## 2013-06-18 LAB — CULTURE, RESPIRATORY

## 2013-06-18 MED ORDER — FUROSEMIDE 10 MG/ML IJ SOLN
40.0000 mg | Freq: Once | INTRAMUSCULAR | Status: AC
  Administered 2013-06-18: 40 mg via INTRAVENOUS
  Filled 2013-06-18: qty 4

## 2013-06-18 NOTE — Progress Notes (Addendum)
TCTS DAILY ICU PROGRESS NOTE                   [address]            [place] [address]          [phone]   2 Days Post-Op Procedure(s) (LRB): VIDEO BRONCHOSCOPY (N/A) VIDEO ASSISTED THORACOSCOPY (VATS)/WEDGE RESECTION (Right)  Total Length of Stay:  LOS: 2 days   Subjective: OOB in chair.  A little sore, but breathing stable.     Objective: Vital signs in last 24 hours: Temp:  [97.4 F (36.3 C)-100.9 F (38.3 C)] 98.9 F (37.2 C) (11/19 0807) Pulse Rate:  [36-110] 85 (11/19 0700) Cardiac Rhythm:  [-] Normal sinus rhythm (11/19 0400) Resp:  [19-28] 24 (11/19 0700) BP: (92-142)/(36-115) 121/58 mmHg (11/19 0700) SpO2:  [25 %-100 %] 99 % (11/19 0700) Arterial Line BP: (55-85)/(52-75) 71/64 mmHg (11/18 1100)  Filed Weights   06/16/13 1530 06/17/13 0723  Weight: 150 lb 5.7 oz (68.2 kg) 155 lb 10.3 oz (70.6 kg)    Weight change: 5 lb 4.7 oz (2.4 kg)   Hemodynamic parameters for last 24 hours:    Intake/Output from previous day: 11/18 0701 - 11/19 0700 In: 800 [P.O.:360; I.V.:440] Out: 3455 [Urine:3095; Chest Tube:360]  Intake/Output this shift:    Current Meds: Scheduled Meds: . albuterol  4 puff Inhalation Q6H  . bisacodyl  10 mg Oral Daily  . cholecalciferol  1,000 Units Oral Daily  . cycloSPORINE  1 drop Both Eyes Q12H  . enoxaparin (LOVENOX) injection  30 mg Subcutaneous Q24H  . fentaNYL   Intravenous Q4H  . furosemide  40 mg Intravenous Once  . hydrALAZINE  25 mg Oral BID  . pantoprazole  40 mg Oral Daily  . simvastatin  10 mg Oral q1800   Continuous Infusions: . bupivacaine ON-Q pain pump    . dextrose 5 % and 0.45 % NaCl with KCl 20 mEq/L 20 mL/hr at 06/18/13 0600   PRN Meds:.diphenhydrAMINE, diphenhydrAMINE, naloxone, ondansetron (ZOFRAN) IV, ondansetron (ZOFRAN) IV, oxyCODONE-acetaminophen, potassium chloride, senna-docusate, sodium chloride, traMADol   Physical Exam: General appearance: alert, cooperative and no  distress Heart: regular rate and rhythm Lungs: Decreased BS R base Wound: Clean and dry Chest tube: 1/7 air leak    Lab Results: CBC: Recent Labs  06/17/13 0441 06/18/13 0445  WBC 7.8 8.6  HGB 12.1 12.5  HCT 35.8* 38.1  PLT 234 207   BMET:  Recent Labs  06/17/13 0441 06/18/13 0445  NA 138 136  K 4.0 4.0  CL 106 102  CO2 23 25  GLUCOSE 126* 107*  BUN 9 9  CREATININE 0.66 0.97  CALCIUM 8.7 8.4    PT/INR: No results found for this basename: LABPROT, INR,  in the last 72 hours Radiology: Dg Chest Port 1 View  06/18/2013   CLINICAL DATA:  Prior thoracotomy.  EXAM: PORTABLE CHEST - 1 VIEW  COMPARISON:  06/14/2013.  FINDINGS: Two right chest tubes in stable position. Left IJ line stable position. No definite pneumothorax. No pleural effusion. No infiltrate . Mild stable right hilar fullness. Heart size normal. Normal pulmonary vascularity. Right chest wall subcutaneous emphysema stable.  IMPRESSION: 1. Two right chest tubes are in stable position. Left IJ line in stable position. 2. Stable right hilar fullness. 3. Stable right chest wall subcutaneous emphysema. Chest is stable from prior exam.   Electronically Signed   By: Maisie Fus  Register   On: 06/18/2013 07:09  Dg Chest Port 1 View  06/17/2013   CLINICAL DATA:  Status post video bronchoscopy and video-assisted thorascopic the right chest.  EXAM: PORTABLE CHEST - 1 VIEW  COMPARISON:  Chest radiograph 06/16/2013  FINDINGS: Left IJ approach central venous catheter tip projects over the superior vena cava. There are two right chest tubes, the more cranial tube has been slightly retracted. Stable cardiac and mediastinal contours. Minimal bibasilar atelectasis. No definite large pleural effusion or pneumothorax. Small amount of right chest wall subcutaneous emphysema.  IMPRESSION: Postsurgical changes right hemi thorax. Slight interval retraction of more cranial right chest tube.   Electronically Signed   By: Annia Belt M.D.   On:  06/17/2013 08:00   Dg Chest Portable 1 View  06/16/2013   CLINICAL DATA:  Postop  EXAM: PORTABLE CHEST - 1 VIEW  COMPARISON:  Portable exam 1305 hr compared to 06/12/2013  FINDINGS: New left jugular central venous catheter with tip projecting over SVC.  Pair of right thoracostomy tubes, cranial most tube of which is coiled.  Upper normal heart size.  Atherosclerotic calcification aortic arch.  Mediastinal contours and pulmonary vascularity normal.  Interval resection of right upper lobe mass.  No gross infiltrate, pleural effusion, or pneumothorax.  IMPRESSION: Postoperative changes as above.   Electronically Signed   By: Ulyses Southward M.D.   On: 06/16/2013 15:26     Assessment/Plan: S/P Procedure(s) (LRB): VIDEO BRONCHOSCOPY (N/A) VIDEO ASSISTED THORACOSCOPY (VATS)/WEDGE RESECTION (Right) Stable, doing well. CTs with small air leak, CXR stable.  Will place CTs to water seal and watch. Pt has her prosthesis on now, PT to assist with mobility. Tx 3S.   COLLINS,GINA H 06/18/2013 8:41 AM  Path reviewed and discussed with patient I have seen and examined Thressa F Kohli and agree with the above assessment  and plan.  Delight Ovens MD Beeper [phone] Office [phone] 06/18/2013 8:52 AM

## 2013-06-18 NOTE — Progress Notes (Signed)
Patient expressed her concerns regarding her daughter's visitation.  She is concerned about her asking for her food as well as "touching" items in the room (as stated by the patient).  I spoke with the daughter and the decision was made for pass code to be changed to a new word and to only allow short visits from the daughter, Britta Mccreedy.  Will continue to monitor.  Vivi Martens RN

## 2013-06-18 NOTE — Progress Notes (Signed)
Rehab Admissions Coordinator Note:  Patient was screened by Trish Mage for appropriateness for an Inpatient Acute Rehab Consult. Noted PT recommending CIR.   At this time, we are recommending Inpatient Rehab consult.  Trish Mage 06/18/2013, 10:36 AM  I can be reached at [phone].

## 2013-06-18 NOTE — Evaluation (Signed)
Physical Therapy Evaluation Patient Details Name: [name] MRN: [id number] DOB: 1928-05-04 Today's Date: 06/18/2013 Time: 4782-9562 PT Time Calculation (min): 41 min  PT Assessment / Plan / Recommendation History of Present Illness  Patient is an 79 y.o. female admitted for VATS for right upper lobe mass.  She has h/o right AKA with prosthesis, HTN, stroke, arthritis, glaucoma and PVD.  Clinical Impression  Patient presents with decreased independence with mobility due to deficits listed below.  Will benefit from skilled PT in the acute setting to maximize independence and allow return home with assist of aide after inpatient rehab stay.    PT Assessment  Patient needs continued PT services    Follow Up Recommendations  CIR    Does the patient have the potential to tolerate intense rehabilitation    Yes  Barriers to Discharge  Decreased caregiver support      Equipment Recommendations  None recommended by PT    Recommendations for Other Services Rehab consult;OT consult   Frequency Min 4X/week    Precautions / Restrictions Precautions Precautions: Fall Precaution Comments: chest tube, right AKA   Pertinent Vitals/Pain Right side moderate with mobility      Mobility  Bed Mobility Bed Mobility: Not assessed Details for Bed Mobility Assistance: up in chair Transfers Transfers: Sit to Stand;Stand to Sit Sit to Stand: From chair/3-in-1;With armrests;1: +2 Total assist Sit to Stand: Patient Percentage: 50% Stand to Sit: To chair/3-in-1;With armrests;1: +2 Total assist Stand to Sit: Patient Percentage: 60% Details for Transfer Assistance: unable to to unlock right AKA to sit; needed lifting help to stand Ambulation/Gait Ambulation/Gait Assistance: 1: +2 Total assist Ambulation/Gait: Patient Percentage: 60% Ambulation Distance (Feet): 20 Feet Assistive device: Rolling walker Ambulation/Gait Assistance Details: pt with increased speed, forward flexed and walker  veering leftl; needed help to steer walker and keep from anterior loss of balance. Gait Pattern: Step-through pattern;Trunk flexed    Exercises General Exercises - Upper Extremity Shoulder Flexion: AAROM;Right;10 reps;Seated Shoulder ABduction: AAROM;Right;10 reps;Seated   PT Diagnosis: Abnormality of gait;Acute pain  PT Problem List: Decreased strength;Decreased activity tolerance;Decreased knowledge of use of DME;Decreased balance;Cardiopulmonary status limiting activity;Decreased mobility;Pain PT Treatment Interventions: DME instruction;Balance training;Gait training;Functional mobility training;Patient/family education;Therapeutic activities;Therapeutic exercise     PT Goals(Current goals can be found in the care plan section) Acute Rehab PT Goals Patient Stated Goal: To go home with help of aide PT Goal Formulation: With patient/family Time For Goal Achievement: 07/02/13 Potential to Achieve Goals: Good  Visit Information  Last PT Received On: 06/18/13 Assistance Needed: +2 History of Present Illness: Patient is an 79 y.o. female admitted for VATS for right upper lobe mass.  She has h/o right AKA with prosthesis, HTN, stroke, arthritis, glaucoma and PVD.       Prior Functioning  Home Living Family/patient expects to be discharged to:: Private residence Living Arrangements: Alone Available Help at Discharge: Personal care attendant;Other (Comment) (currently 8 hours a day) Type of Home: Independent living facility Home Access: Elevator Home Layout: One level Home Equipment: Walker - 2 wheels;Wheelchair - manual Prior Function Level of Independence: Independent with assistive device(s) Communication Communication: No difficulties    Cognition  Cognition Arousal/Alertness: Awake/alert Behavior During Therapy: WFL for tasks assessed/performed Overall Cognitive Status: Within Functional Limits for tasks assessed    Extremity/Trunk Assessment Lower Extremity  Assessment Lower Extremity Assessment: RLE deficits/detail RLE Deficits / Details: right AKA RN reports difficulty donning prosthesis this morning   Balance Balance Balance Assessed: Yes Static Standing Balance Static  Standing - Balance Support: Bilateral upper extremity supported Static Standing - Level of Assistance: 3: Mod assist Static Standing - Comment/# of Minutes: standing moment prior to walking with anterior bias  End of Session PT - End of Session Equipment Utilized During Treatment: Gait belt Activity Tolerance: Patient limited by fatigue Patient left: in chair;with call bell/phone within reach;with family/visitor present  GP     [facility] 06/18/2013, 10:30 AM  Sheran Lawless, PT [phone] 06/18/2013

## 2013-06-18 NOTE — Care Management Note (Signed)
Page 1 of 2   06/25/2013     12:11:36 PM   CARE MANAGEMENT NOTE 06/25/2013  Patient:  [name], [name]   Account Number:  [id number]  Date Initiated:  06/17/2013  Documentation initiated by:  MAYO,HENRIETTA  Subjective/Objective Assessment:   adm s/p (R) VATS, mini-thoracotomy, (R) upper lobectomy, lymph node dissection    PCP  Dr Florentina Jenny     Action/Plan:   PTA pt lived at home had a HHA that comes in - Per PT notes recommending CIR   Anticipated DC Date:  06/25/2013   Anticipated DC Plan:  HOME W HOME HEALTH SERVICES      DC Planning Services  CM consult      [facility] Choice  HOME HEALTH   Choice offered to / List presented to:  C-1 Patient        HH arranged  HH-1 RN  HH-2 PT  HH-3 OT      [facility] agency  [facility]   Status of service:  Completed, signed off Medicare Important Message given?   (If response is "NO", the following Medicare IM given date fields will be blank) Date Medicare IM given:   Date Additional Medicare IM given:    Discharge Disposition:  HOME W HOME HEALTH SERVICES  Per UR Regulation:  Reviewed for med. necessity/level of care/duration of stay  If discussed at Long Length of Stay Meetings, dates discussed:   06/24/2013    Comments:  Contact:  [name], granddaughter  [phone]  (home health aide- Gordy Clement [phone])  06/25/13- 1200- Donn Pierini RN, BSN [phone] Received call from pt- HH agency of pt's choice is [facility]- informed pt that this CM would make referral and pt would hear from someone with [facility] and services should begin on Friday -- Referral called to [facility] with Forde Radon for HH-RN/PT/OT and confirmed that services could begin on 06/27/13.  06/25/13- 1000- Donn Pierini RN, BSN [phone] Per CIR this AM there are no beds available- per conversation with pt she wants to go home with [facility]- states that her daughter can stay with her at night and she has her PCS aide there during the day some.  Plan is to return home with [facility]- orders in chart for RN/PT/OT- pt still does not remember name of agency that she used in past but has card at home- she really wants to use the same agency again and plan is for pt to call this CM when she gets home with the name of the [facility] agency of choice.  06/24/13- 1445- Donn Pierini RN, BSN [phone] Still awaiting completion of CIR consult- per Britta Mccreedy with CIR there are no more beds available for today- unsure what bed situation will be for tomorrow or what finished consult recommendations will be for- have spoken with pt regarding STSNF option- pt still refuses STSNF as an option for d/c and does not want to do a bed search for any bed offers- pt states that if CIR is not an option she would like to just go home- call made to Ms. Blackwell with [facility]- regarding PCS- aide services can begin on day of discharge as soon as tomorrow- spoke with Gordy Clement also who is pt's PCS aide - who states that she can be here in the am at 930 to give pt a ride home - if pt returns home will need orders for HH-RN/PT/OT along with [facility].  06/20/13- 1600- Donn Pierini RN, BSN [phone]  Pt really wants CIR as first choice is willing to "think" about STSNF as a back up but states that she would most likely want to return home pending her progress. Will reassess first of the week - pt still has chest tubes in at this time.  06/18/13 1115 Henrietta Mayo RN MSN BSN CCM Provided information re assessment for increase in hours to pt and granddaughter.  Pt now agrees to consider both CIR and ST-SNF for rehab before returning home.  06/17/13 1045 Henrietta Mayo RN MSN BSN CCM Met with pt and granddaughter @ bedside.  Pt currently has PCS aide 2 hrs/day, 7 days/wk for a total of 70 hrs monthly.  Requests CM assistance to increase hours.  Agency providing aide is [facility] @ [phone].  TC to agency, talked with Ms Amie Critchley.  Ms Amie Critchley will request the  maximum of 80 hrs but will need to be notified when discharge date is known (# to contact is [phone]), as RN with [employer] will need to call pt's home to schedule a visit to assess need for increase in hours. Also discussed possibility of CIR vs ST-SNF for rehab - pt adamant that she will not consider SNF, will consider CIR.

## 2013-06-18 NOTE — Plan of Care (Signed)
Problem: Phase II Progression Outcomes Goal: Weaning O2 for sats > or equal to 88% Outcome: Progressing At 1900 had pt off of oxygen, later in the shift as pt began to dose, Sats dropped to 88%, with waveform accurate, 02 at 2L applied, pt now remains on 2L, but expect can take off when fully awake.

## 2013-06-18 NOTE — Op Note (Signed)
NAMEKEILEY, LEVEY NO.:  [id number]  MEDICAL RECORD NO.:  [id number]  LOCATION:  2S02C                        FACILITY:  [facility]  PHYSICIAN:  Sheliah Plane, MD    DATE OF BIRTH:  February 15, 1928  DATE OF PROCEDURE:  06/16/2013 DATE OF DISCHARGE:                              OPERATIVE REPORT   PREOPERATIVE DIAGNOSIS:  Right upper lobe lung cancer.  POSTOPERATIVE DIAGNOSIS:  Right upper lobe lung cancer.  SURGICAL PROCEDURE:  Bronchoscopy, right video-assisted thoracoscopy, minithoracotomy, right upper lobectomy with lymph node dissection, and placement of On-Q device.  SURGEON:  Sheliah Plane, MD  FIRST ASSISTANT:  Coral Ceo, P.A.  BRIEF HISTORY:  The patient is an 79 year old female who has been evaluated for a right upper lobe lung mass that has been progressively growing since identified in May 2014.  After evaluation with brain scan, PET scan, clearance from Cardiology, and in consultation with the patient and her family, she was agreeable with proceeding with lung resection.  Risks and options were discussed with her in detail, and she was agreeable with proceeding, and signed informed consent.  DESCRIPTION OF PROCEDURE:  With central line and arterial line in place, the patient underwent general endotracheal anesthesia without incident. Time-out was performed through the single-lumen endotracheal tube.  A fiberoptic bronchoscope was passed to the subsegmental level without evidence of endobronchial lesions.  The scope was removed.  The double- lumen endotracheal tube was then placed, and the patient was turned in lateral decubitus position.  A port incision was made approximately in the fourth intercostal space.  A 30-degree video scope was introduced into the right pleural space.  There were some adhesions of the right upper lobe not involving the tumor itself.  Two additional port incisions were made, 1 anteriorly and 1 more posteriorly.  A  small minithoracotomy incision was made through the 2 port sites in the incision.  We proceeded with dissection of the hilum with division of the right upper lobe vein with a vascular stapler.  Two pulmonary artery branches to the right upper lobe were identified and each was encircled and stapled with a vascular stapler.  The right upper lobe bronchial takeoff was identified and using a plaque stapler, this was occluded, the middle and lower lobe inflated nicely with isolation of right upper lobe.  The stapler was then fired and with the pulmonary venous and bronchial branches divided the remaining sutures were divided with a purple stapler.  Specimen was placed in a specimen bag and brought out through the small incision.  The bronchial stump was tested for air leak and was intact.  Coseal was placed in the area of the bronchial stump and also along the fissures.  An On-Q device catheter was then tunneled subpleurally along the posterior rib edges.  A Blake drain was placed posteriorly and a 28 straight chest tube was placed anteriorly through the port sites.  The incision was closed with interrupted 0-Vicryl in the muscle layers and running 2-0 Vicryl in the subcutaneous tissue, and 4-0 subcuticular stitch.  Prior to closure, the lung was reinflated and expanded nicely.  There was no air leak at the completion of  the procedure.  Dermabond was applied.  Estimated blood loss 100 mL.  The patient was awakened and extubated at the completion of the procedure and transferred to the recovery room for postoperative care.  She tolerated the procedure without obvious complications.     Sheliah Plane, MD     EG/MEDQ  D:  06/18/2013  T:  06/18/2013  Job:  [id number]

## 2013-06-18 NOTE — Plan of Care (Signed)
Problem: Phase II Progression Outcomes Goal: Pain controlled Outcome: Progressing No pain with low dose Fentanyl PCA with minimal used Goal: Tolerates progressive ambulation Outcome: Not Progressing Pt has Hx of R AKA which makes ambulation difficult.  Prosthetic leg in room but difficulty encountered when trying to attach prosthetic leg.

## 2013-06-19 ENCOUNTER — Inpatient Hospital Stay (HOSPITAL_COMMUNITY): Payer: Medicare Other

## 2013-06-19 LAB — BASIC METABOLIC PANEL
BUN: 12 mg/dL (ref 6–23)
CO2: 21 mEq/L (ref 19–32)
Calcium: 7.3 mg/dL — ABNORMAL LOW (ref 8.4–10.5)
Chloride: 105 mEq/L (ref 96–112)
Creatinine, Ser: 0.91 mg/dL (ref 0.50–1.10)
GFR calc Af Amer: 66 mL/min — ABNORMAL LOW (ref >90)
GFR calc non Af Amer: 57 mL/min — ABNORMAL LOW (ref >90)
Glucose, Bld: 103 mg/dL — ABNORMAL HIGH (ref 70–99)
Potassium: 3.3 mEq/L — ABNORMAL LOW (ref 3.5–5.1)
Sodium: 137 mEq/L (ref 135–145)

## 2013-06-19 LAB — CBC
HCT: 33 % — ABNORMAL LOW (ref 36.0–46.0)
Hemoglobin: 11.1 g/dL — ABNORMAL LOW (ref 12.0–15.0)
MCH: 29.9 pg (ref 26.0–34.0)
MCHC: 33.6 g/dL (ref 30.0–36.0)
MCV: 88.9 fL (ref 78.0–100.0)
Platelets: 183 10*3/uL (ref 150–400)
RBC: 3.71 MIL/uL — ABNORMAL LOW (ref 3.87–5.11)
RDW: 13.5 % (ref 11.5–15.5)
WBC: 9.4 10*3/uL (ref 4.0–10.5)

## 2013-06-19 MED ORDER — ENSURE COMPLETE PO LIQD
237.0000 mL | Freq: Three times a day (TID) | ORAL | Status: DC
  Administered 2013-06-19 – 2013-06-20 (×4): 237 mL via ORAL

## 2013-06-19 MED ORDER — POLYETHYLENE GLYCOL 3350 17 G PO PACK
17.0000 g | PACK | Freq: Once | ORAL | Status: AC
  Administered 2013-06-19: 17 g via ORAL
  Filled 2013-06-19: qty 1

## 2013-06-19 MED ORDER — POTASSIUM CHLORIDE CRYS ER 10 MEQ PO TBCR
30.0000 meq | EXTENDED_RELEASE_TABLET | Freq: Two times a day (BID) | ORAL | Status: AC
  Administered 2013-06-19 (×2): 30 meq via ORAL
  Filled 2013-06-19 (×2): qty 1

## 2013-06-19 NOTE — Progress Notes (Signed)
Read, reviewed, and agree.  Paolina Karwowski B. Keosha Rossa, PT, DPT #[phone]  

## 2013-06-19 NOTE — Progress Notes (Addendum)
      [address]       [place] [address]             [phone]       3 Days Post-Op Procedure(s) (LRB): VIDEO BRONCHOSCOPY (N/A) VIDEO ASSISTED THORACOSCOPY (VATS)/WEDGE RESECTION (Right)  Subjective: Patient with not much appetite. Passing flatus but no bowel movement yet.  Objective: Vital signs in last 24 hours: Temp:  [97.5 F (36.4 C)-99.3 F (37.4 C)] 98.6 F (37 C) (11/20 0341) Pulse Rate:  [76-92] 91 (11/20 0341) Cardiac Rhythm:  [-] Normal sinus rhythm (11/20 0341) Resp:  [21-32] 32 (11/20 0341) BP: (102-150)/(53-89) 150/74 mmHg (11/20 0341) SpO2:  [94 %-99 %] 94 % (11/20 0341)     Intake/Output from previous day: 11/19 0701 - 11/20 0700 In: 800 [P.O.:480; I.V.:320] Out: 1175 [Urine:1100; Chest Tube:75]   Physical Exam:  Cardiovascular: RRR Pulmonary: Diminished at right base; no rales, wheezes, or rhonchi. Abdomen: Soft, non tender, bowel sounds present. Extremity: SCD on left LE. Wounds: Clean and dry.  No erythema or signs of infection. Chest Tubes: to water seal, air leak present and worse with cough  Lab Results: CBC: Recent Labs  06/18/13 0445 06/19/13 0345  WBC 8.6 9.4  HGB 12.5 11.1*  HCT 38.1 33.0*  PLT 207 183   BMET:  Recent Labs  06/18/13 0445 06/19/13 0345  NA 136 137  K 4.0 3.3*  CL 102 105  CO2 25 21  GLUCOSE 107* 103*  BUN 9 12  CREATININE 0.97 0.91  CALCIUM 8.4 7.3*    PT/INR: No results found for this basename: LABPROT, INR,  in the last 72 hours ABG:  INR: Will add last result for INR, ABG once components are confirmed Will add last 4 CBG results once components are confirmed  Assessment/Plan:  1. CV - SR. On Hydralazine 25 bid. On Plavix pre op, will not restart just yet. 2.  Pulmonary - Chest tubes with 75 cc last 12 hours. 360 cc for prior 24 hours. Chest tubes are to water seal. There is a 2+ leak. CXR this am appears to show stable subcutaneous emphysema right lateral chest wall,  questionable trace right apical pneumothorax. Encourage incentive spirometer. 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 Mild anemia- H and H 11.1 and 33. 4. Supplement potassium 5. Ensure tid (chocoloate at patient's request) 6.Stop On Q 7. LOC constipation 8. PT to assist with ambulation as has prosthesis for RLE  ZIMMERMAN,DONIELLE MPA-C 06/19/2013,7:48 AM  Small air leak Leave ct for now. I have seen and examined [name] and agree with the above assessment  and plan.  Delight Ovens MD Beeper [phone] Office [phone] 06/19/2013 8:43 AM

## 2013-06-19 NOTE — Progress Notes (Signed)
Physical Therapy Treatment Patient Details Name: [name] MRN: [id number] DOB: February 23, 1928 Today's Date: 06/19/2013 Time: 7829-5621 PT Time Calculation (min): 37 min  PT Assessment / Plan / Recommendation  History of Present Illness Patient is an 79 y.o. female admitted for VATS for right upper lobe mass.  She has h/o right AKA with prosthesis, HTN, stroke, arthritis, glaucoma and PVD.   PT Comments   Pt reports that her prosthesis needs modifying to fit her better, will continue to follow and notify appropriate personnel if the problem persists.  Patient walked less than her previous therapy sessions, likely limited by fatigue as she had difficulty getting her prosthesis on and locked.  Patient is unsteady and uncoordinated in walking with her R leg through limited ambulation.  Pt will continue to benefit from skilled PT intervention in the acute setting to address her functional limitations.   Follow Up Recommendations  CIR           Equipment Recommendations  None recommended by PT    Recommendations for Other Services Rehab consult;OT consult  Frequency Min 4X/week         Precautions / Restrictions Precautions Precautions: Fall Restrictions Weight Bearing Restrictions: No       Mobility  Bed Mobility Bed Mobility: Rolling Left;Left Sidelying to Sit Rolling Left: 3: Mod assist Left Sidelying to Sit: 3: Mod assist Details for Bed Mobility Assistance: required trunk control to get her into upright sitting Transfers Transfers: Sit to Stand;Stand to Sit Sit to Stand: 1: +2 Total assist Sit to Stand: Patient Percentage: 50% Stand to Sit: 1: +2 Total assist Stand to Sit: Patient Percentage: 50% Details for Transfer Assistance: Patient requires help with getting prosthesis locked and to maintain balance while standing to accomplish this.   Ambulation/Gait Ambulation/Gait Assistance: 2: Max assist Ambulation/Gait: Patient Percentage: 70% Ambulation Distance (Feet): 5  Feet Assistive device: Rolling walker Ambulation/Gait Assistance Details: Patient leaning to L side, pt doesn't put much weight through R leg and has trouble lifting it, forward trunk lean, easily fatigued, requires A to pull chair behind due to lots of leads and chest tube present Gait Pattern: Step-to pattern Stairs: No      PT Goals (current goals can now be found in the care plan section) Acute Rehab PT Goals Patient Stated Goal: return home PT Goal Formulation: With patient/family Time For Goal Achievement: 07/02/13 Potential to Achieve Goals: Good  Visit Information  Last PT Received On: 06/19/13 Assistance Needed: +2 History of Present Illness: Patient is an 79 y.o. female admitted for VATS for right upper lobe mass.  She has h/o right AKA with prosthesis, HTN, stroke, arthritis, glaucoma and PVD.    Subjective Data  Patient Stated Goal: return home   Cognition  Cognition Arousal/Alertness: Awake/alert Behavior During Therapy: WFL for tasks assessed/performed Overall Cognitive Status: Within Functional Limits for tasks assessed    Balance     End of Session PT - End of Session Equipment Utilized During Treatment: Gait belt Activity Tolerance: Patient limited by fatigue Patient left: in chair;with call bell/phone within reach   GP    Barrie Dunker, SPT Pager:  [phone]  Barrie Dunker 06/19/2013, 12:02 PM

## 2013-06-20 ENCOUNTER — Inpatient Hospital Stay (HOSPITAL_COMMUNITY): Payer: Medicare Other

## 2013-06-20 ENCOUNTER — Encounter: Payer: Self-pay | Admitting: *Deleted

## 2013-06-20 LAB — CBC
HCT: 34.4 % — ABNORMAL LOW (ref 36.0–46.0)
Hemoglobin: 11.5 g/dL — ABNORMAL LOW (ref 12.0–15.0)
MCH: 29.9 pg (ref 26.0–34.0)
MCHC: 33.4 g/dL (ref 30.0–36.0)
MCV: 89.6 fL (ref 78.0–100.0)
Platelets: 207 10*3/uL (ref 150–400)
RBC: 3.84 MIL/uL — ABNORMAL LOW (ref 3.87–5.11)
RDW: 13.7 % (ref 11.5–15.5)
WBC: 8.2 10*3/uL (ref 4.0–10.5)

## 2013-06-20 LAB — BASIC METABOLIC PANEL
BUN: 14 mg/dL (ref 6–23)
CO2: 25 mEq/L (ref 19–32)
Calcium: 8.6 mg/dL (ref 8.4–10.5)
Chloride: 101 mEq/L (ref 96–112)
Creatinine, Ser: 0.85 mg/dL (ref 0.50–1.10)
GFR calc Af Amer: 71 mL/min — ABNORMAL LOW (ref >90)
GFR calc non Af Amer: 62 mL/min — ABNORMAL LOW (ref >90)
Glucose, Bld: 107 mg/dL — ABNORMAL HIGH (ref 70–99)
Sodium: 133 mEq/L — ABNORMAL LOW (ref 135–145)

## 2013-06-20 NOTE — Discharge Summary (Signed)
Physician Discharge Summary       [address] [place]       [name] [address]             [phone]    Patient ID: [name] MRN: [id number] DOB/AGE: [birth year] 79 y.o.  Admit date: 06/16/2013 Discharge date: 06/25/2013    Admission Diagnoses: 1. Right upper lung mass 2. History of tobacco abuse 3. History of COPD GOLD 4. History of hypertension 5. History of hyperlipidemia 6. History of PVD (s/p right AKA)    Discharge Diagnoses:  1. Right upper lobe adenocarcinoma (T2a, N0) 2. History of tobacco abuse 3. History of COPD GOLD 4. History of hypertension 5. History of hyperlipidemia 6. History of PVD (s/p right AKA)    Procedure (s):  Bronchoscopy, right video-assisted thoracoscopy,  minithoracotomy, right upper lobectomy with lymph node dissection, and  placement of On-Q device by Dr. Tyrone [name] on 06/16/2013.    Pathology: 1. Lung, resection (segmental or lobe), Right upper lobe - ADENOCARCINOMA WITH FOCAL SARCOMATOUS DIFFERENTIATION, 3.5 CM. - FOCAL VISCERAL PLEURAL INVOLVEMENT. - BRONCHIAL MARGIN FREE OF TUMOR. - ONE BENIGN HILAR LYMPH NODE. 2. Lymph node, biopsy, 10 R - ANTHRACOTIC LYMPH NODE. NO EVIDENCE OF MALIGNANCY. 3. Lymph node, biopsy, 11 R - ANTHRACOTIC LYMPH NODE. NO EVIDENCE OF MALIGNANCY. 4. Lymph node, biopsy, 10 R #2 - ANTHRACOTIC LYMPH NODE. NO EVIDENCE OF MALIGNANCY. 5. Lymph node, biopsy, 4 R azygos - ANTHRACOTIC LYMPH NODE. NO EVIDENCE OF MALIGNANCY. 6. Lymph node, biopsy, 2 R - ANTHRACOTIC LYMPH NODE. NO EVIDENCE OF MALIGNANCY.  TNM code: pT2a , pN0    History of Presenting Illness: This is an 79 year old female referred by Dr. Sherene [name] for a 3.5 cm right upper lobe lung mass, highly suspicious for carcinoma of the lung. The patient had a chest x-ray in July of 2013 which commented on a possible right upper lung lesion. Subsequent chest x-ray, almost a year later, showed this area to be more prominent. A CT scan of  the chest was performed leading to a PET scan and bronchoscopy. The patient developed shortness of breath and pulmonary bleed following bronchoscopy and was hospitalized for several days ultimately discharged home. The path was negative.  She is now referred to thoracic surgery office for evaluation of treatment options.   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 The patient's granddaughter notes that she does get short of breath with exertion but is able to walk around the grocery store with assistance. In larger stores she usually rides a cart. The patient has been a long-term smoker more than 50 years and quit in 2011. Dr. Tyrone [name] discussed the need for a bronchoscopy, right VATS, and possible right upper lobectomy. Potential risks, complications, and benefits were discussed with the patient and she agreed to proceed with surgery. She was admitted on 06/16/2013 in order to undergo right lung surgery.   Brief Hospital Course:  She has been afebrile and hemodynamically stable. Her a-line and foley were removed early in her post operative course. Chest tube output gradually decreased and she did have a small air leak. Her air leak did resolve. One chest tube was removed on 06/20/2013. Chest x ray remained stable. The remaining chest tube was then removed. She has been ambulating on room air. She is tolerating a diet and has had a bowel movement.  Her wounds are continuing to heal and there are no signs of infection. Pathology was positive for T2a, N0 adenocarcinoma.  She has progressed well overall and is at her baseline mobility.  She was evaluated by inpatient rehab and is not a candidate for further services.  Case management has arranged for home health PT/OT and RN, and the patient has a daytime aide as well as family assistance at  night.  She has been evaluated on today's date and is surgically stable for discharge on 06/25/2013.    Latest Vital Signs: Blood pressure 132/54, pulse 79, temperature 97.3 F (36.3 C), temperature source Oral, resp. rate 22, height 5' (1.524 m), weight 70.6 kg (155 lb 10.3 oz), SpO2 99.00%.  Physical Exam: Cardiovascular: RRR  Pulmonary: Diminished at right base; no rales, wheezes, or rhonchi.  Abdomen: Soft, non tender, bowel sounds present.  Extremity: SCD on left LE.  Wounds: Clean and dry. No erythema or signs of infection.  Chest Tubes: to water seal   Discharge Condition:Stable  Recent laboratory studies:  Lab Results  Component Value Date   WBC 8.2 06/20/2013   HGB 11.5* 06/20/2013   HCT 34.4* 06/20/2013   MCV 89.6 06/20/2013   PLT 207 06/20/2013   Lab Results  Component Value Date   NA 133* 06/20/2013   K SLIGHT HEMOLYSIS 06/20/2013   CL 101 06/20/2013   CO2 25 06/20/2013   CREATININE 0.85 06/20/2013   GLUCOSE 107* 06/20/2013      Diagnostic Studies: Ct Biopsy  05/29/2013   CLINICAL DATA:  3.4 CM RIGHT UPPER LOBE SPICULATED MASS  EXAM: CT GUIDED NEEDLE CORE BIOPSY OF RIGHT UPPER LOBE MASS  ANESTHESIA/SEDATION: 2  mg IV Versed; 75 mcg IV Fentanyl  Total Moderate Sedation Time: 15 minutes.  PROCEDURE: The procedure risks, benefits, and alternatives were explained to the patient. Questions regarding the procedure were encouraged and answered. The patient understands and consents to the procedure.  The right anterior chest was prepped with betadinein a sterile fashion, and a sterile drape was applied covering the operative field. A sterile gown and sterile gloves were used for the procedure. Local anesthesia was provided with 1% Lidocaine.  Patient was positioned supine. Noncontrast localization CT performed. Right upper lobe anterior mass was localized. Under sterile conditions and local anesthesia, CT guidance was utilized to advance a 17 gauge 11.8 cm access needle  from an anterior approach. Needle position confirmed within the lesion by CT. 18 gauge core biopsies obtained and placed in formalin. No immediate complication. Needle tract was embolized with a blood patch. No effusion or pneumothorax following needle removal. Patient tolerated the biopsy well.  Complications: None immediate  FINDINGS: CT imaging confirms needle placed in in the anterior right upper lobe mass.  IMPRESSION: Successful CT-guided right upper lobe mass 18 gauge core biopsy   Electronically Signed   By: Ruel Favors M.D.   On: 05/29/2013 13:28   Dg Chest Port 1 View  06/20/2013   CLINICAL DATA:  Chest tube.  EXAM: PORTABLE CHEST - 1 VIEW  COMPARISON:  06/19/2013.  FINDINGS: Two right chest tubes again noted in stable position. Left IJ line in stable position. On today's exam a tiny right apical pneumothorax cannot be entirely excluded. Subcutaneous emphysema along the right chest wall is stable. No focal infiltrate. No pleural effusion. Heart size and pulmonary vascularity stable.  IMPRESSION: 1. Stable tube positions with 2 right chest tubes and left IJ line. 2. Tiny right apical pneumothorax cannot be excluded on  today's exam. Persistent right chest wall subcutaneous emphysema. 3. No acute pulmonary infiltrate. Stable cardiomegaly and pulmonary vascularity.   Electronically Signed   By: Maisie Fus  Register   On: 06/20/2013 08:26     Discharge Medications:    Medication List    STOP taking these medications       acetaminophen 500 MG tablet  Commonly known as:  TYLENOL      TAKE these medications       cholecalciferol 1000 UNITS tablet  Commonly known as:  VITAMIN D  Take 1,000 Units by mouth daily.     clopidogrel 75 MG tablet  Commonly known as:  PLAVIX  Take 75 mg by mouth 3 (three) times a week. Monday, Wednesday, friday     furosemide 20 MG tablet  Commonly known as:  LASIX  Take 20 mg by mouth 2 (two) times a week. 1 tablet twice a week on Mon and Thurs as needed for  edema.     hydrALAZINE 25 MG tablet  Commonly known as:  APRESOLINE  Take 25 mg by mouth 2 (two) times daily.     meclizine 25 MG tablet  Commonly known as:  ANTIVERT  Take 25 mg by mouth 3 (three) times daily as needed. For dizziness     omeprazole 20 MG capsule  Commonly known as:  PRILOSEC  Take 20 mg by mouth daily before breakfast.     oxyCODONE-acetaminophen 5-325 MG per tablet  Commonly known as:  PERCOCET/ROXICET  Take 1-2 tablets by mouth every 4 (four) hours as needed for severe pain.     pravastatin 20 MG tablet  Commonly known as:  PRAVACHOL  Take 20 mg by mouth at bedtime.     PROAIR HFA 108 (90 BASE) MCG/ACT inhaler  Generic drug:  albuterol  Inhale 2 puffs into the lungs every 6 (six) hours as needed for wheezing.     RESTASIS 0.05 % ophthalmic emulsion  Generic drug:  cycloSPORINE  Place 1 drop into both eyes every 12 (twelve) hours.     SENNA LAX PO  Take 1-2 tablets by mouth daily as needed. For constipation     traMADol 50 MG tablet  Commonly known as:  ULTRAM  Take 0.5 tablets (25 mg total) by mouth 2 (two) times daily as needed for pain.          Follow Up Appointments:     Follow-up Information   Follow up with GERHARDT,EDWARD B, MD. (PA/LAT CXR to be taken (at [facility] which is in the same building as Dr. Dennie Maizes office) on 07/17/2013 at 11:15 am;Appointment with Dr. Tyrone [name] is on 07/17/2013 at 12:15 pm)    Specialty:  Cardiothoracic Surgery   Contact information:   [address] [place] [address] [phone]       Signed: Doree Fudge MPA-C 06/20/2013, 11:32 AM

## 2013-06-20 NOTE — Progress Notes (Signed)
Chaplain made initial visit. Patient said things were going okay but she was having some pain. Chaplain informed her of the availability of chaplain services if her needs change.

## 2013-06-20 NOTE — Progress Notes (Signed)
Faxed [facility] request form to pathology dept.  With confirmation fax recieved

## 2013-06-20 NOTE — Progress Notes (Signed)
Utilization review completed.  

## 2013-06-20 NOTE — Progress Notes (Addendum)
      [address]       [place] [address]             [phone]       4 Days Post-Op Procedure(s) (LRB): VIDEO BRONCHOSCOPY (N/A) VIDEO ASSISTED THORACOSCOPY (VATS)/WEDGE RESECTION (Right)  Subjective: Patient feels "gassy" after Ensure. Had a bowel movement.  Objective: Vital signs in last 24 hours: Temp:  [97.3 F (36.3 C)-99.1 F (37.3 C)] 97.3 F (36.3 C) (11/21 0716) Pulse Rate:  [71-82] 74 (11/21 0716) Cardiac Rhythm:  [-] Normal sinus rhythm (11/21 0738) Resp:  [16-29] 24 (11/21 0716) BP: (129-157)/(52-76) 138/76 mmHg (11/21 0716) SpO2:  [95 %-98 %] 98 % (11/21 0716)     Intake/Output from previous day: 11/20 0701 - 11/21 0700 In: 1810 [P.O.:1260; I.V.:550] Out: 1130 [Urine:825; Chest Tube:305]   Physical Exam:  Cardiovascular: RRR Pulmonary: Diminished at right base; no rales, wheezes, or rhonchi. Abdomen: Soft, non tender, bowel sounds present. Extremity: SCD on left LE. Wounds: Clean and dry.  No erythema or signs of infection. Chest Tubes: to water seal  Lab Results: CBC:  Recent Labs  06/19/13 0345 06/20/13 0405  WBC 9.4 8.2  HGB 11.1* 11.5*  HCT 33.0* 34.4*  PLT 183 207   BMET:   Recent Labs  06/19/13 0345 06/20/13 0405  NA 137 133*  K 3.3* SLIGHT HEMOLYSIS  CL 105 101  CO2 21 25  GLUCOSE 103* 107*  BUN 12 14  CREATININE 0.91 0.85  CALCIUM 7.3* 8.6    PT/INR: No results found for this basename: LABPROT, INR,  in the last 72 hours ABG:  INR: Will add last result for INR, ABG once components are confirmed Will add last 4 CBG results once components are confirmed  Assessment/Plan:  1. CV - SR. On Hydralazine 25 bid. On Plavix pre op, will not restart just yet. 2.  Pulmonary - Chest tubes with 305 cc last 24 hours. Chest tubes are to water seal. There is some tidling but no obvious air leak. CXR this am appears to show stable subcutaneous emphysema right lateral chest wall,  trace right apical pneumothorax.  Possibily remove one chest tube. Encourage incentive spirometer and flutter valve. 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 Mild anemia- H and H 11.5 and 34.4. 4. PT to assist with ambulation as has prosthesis for RLE 5. Stop Ensure  ZIMMERMAN,DONIELLE MPA-C 06/20/2013,11:12 AM  No air leak today Likely d/c one chest tube in am I have seen and examined Elenore Paddy and agree with the above assessment  and plan.  Delight Ovens MD Beeper [phone] Office [phone] 06/20/2013 2:55 PM

## 2013-06-20 NOTE — Progress Notes (Signed)
Physical Therapy Treatment Patient Details Name: [name] MRN: [id number] DOB: [birth year] Today's Date: 06/20/2013 Time: 5621-3086 PT Time Calculation (min): 26 min  PT Assessment / Plan / Recommendation  History of Present Illness Patient is an 79 y.o. female admitted for VATS for right upper lobe mass.  She has h/o right AKA with prosthesis, HTN, stroke, arthritis, glaucoma and PVD.   PT Comments   Pt able to increase ambulation distance however continues to need (A) to maintain balance during ambulation and donning prosthesis.    Follow Up Recommendations  CIR     Equipment Recommendations  None recommended by PT    Recommendations for Other Services Rehab consult;OT consult  Frequency Min 4X/week   Plan Current plan remains appropriate    Precautions / Restrictions Precautions Precautions: Fall (chest tube on R side) Restrictions Weight Bearing Restrictions: No   Pertinent Vitals/Pain C/o right sided pain at chest tube insertion site but does not rate    Mobility  Bed Mobility Bed Mobility: Not assessed Transfers Transfers: Sit to Stand;Stand to Sit Sit to Stand: 3: Mod assist Stand to Sit: 3: Mod assist Details for Transfer Assistance: Patient requires help with getting prosthesis locked and to maintain balance while standing to accomplish this.   Ambulation/Gait Ambulation/Gait Assistance: 4: Min assist Ambulation Distance (Feet): 30 Feet Assistive device: Rolling walker Ambulation/Gait Assistance Details: Pt continues to have forward flexion during ambulation with RW too far in front.  Cues for proper RW placement and step sequence.  Gait Pattern: Step-to pattern Stairs: No    Exercises     PT Diagnosis:    PT Problem List:   PT Treatment Interventions:     PT Goals (current goals can now be found in the care plan section) Acute Rehab PT Goals Patient Stated Goal: return home PT Goal Formulation: With patient/family Time For Goal Achievement:  07/02/13 Potential to Achieve Goals: Good  Visit Information  Last PT Received On: 06/20/13 Assistance Needed: +2 (ambulation) History of Present Illness: Patient is an 79 y.o. female admitted for VATS for right upper lobe mass.  She has h/o right AKA with prosthesis, HTN, stroke, arthritis, glaucoma and PVD.    Subjective Data  Patient Stated Goal: return home   Cognition  Cognition Arousal/Alertness: Awake/alert Behavior During Therapy: WFL for tasks assessed/performed Overall Cognitive Status: Within Functional Limits for tasks assessed    Balance  Static Standing Balance Static Standing - Balance Support: Bilateral upper extremity supported Static Standing - Level of Assistance: 3: Mod assist Static Standing - Comment/# of Minutes: (A) to maintain balance to lock in prothesis.  End of Session PT - End of Session Equipment Utilized During Treatment: Gait belt Activity Tolerance: Patient limited by fatigue Patient left: in chair;with call bell/phone within reach   GP     Rica Heather 06/20/2013, 1:20 PM [place], PT DPT [phone]

## 2013-06-21 ENCOUNTER — Inpatient Hospital Stay (HOSPITAL_COMMUNITY): Payer: Medicare Other

## 2013-06-21 MED ORDER — GUAIFENESIN ER 600 MG PO TB12
1200.0000 mg | ORAL_TABLET | Freq: Two times a day (BID) | ORAL | Status: DC
  Administered 2013-06-21 – 2013-06-24 (×8): 1200 mg via ORAL
  Filled 2013-06-21 (×10): qty 2

## 2013-06-21 NOTE — Progress Notes (Addendum)
.       [address]       [name] [address]             [phone]      5 Days Post-Op Procedure(s) (LRB): VIDEO BRONCHOSCOPY (N/A) VIDEO ASSISTED THORACOSCOPY (VATS)/WEDGE RESECTION (Right)  Subjective:  [name] complains that she is unable to cough stuff up.  She states she has been trying but has been unsuccessful.  Objective: Vital signs in last 24 hours: Temp:  [97.4 F (36.3 C)-98.1 F (36.7 C)] 97.8 F (36.6 C) (11/22 0726) Pulse Rate:  [73-90] 90 (11/22 0726) Cardiac Rhythm:  [-] Normal sinus rhythm (11/22 0726) Resp:  [22-29] 29 (11/22 0726) BP: (101-168)/(51-85) 101/85 mmHg (11/22 0726) SpO2:  [97 %-99 %] 97 % (11/22 0726)  Intake/Output from previous day: 11/21 0701 - 11/22 0700 In: 1217.6 [P.O.:720; I.V.:497.6] Out: 680 [Urine:650; Chest Tube:30] Intake/Output this shift: Total I/O In: 40 [I.V.:40] Out: 145 [Urine:125; Chest Tube:20]  General appearance: alert, cooperative and no distress Heart: regular rate and rhythm Lungs: diminished breath sounds right base Abdomen: soft, non-tender; bowel sounds normal; no masses,  no organomegaly Wound: clean and dry  Lab Results:  Recent Labs  06/19/13 0345 06/20/13 0405  WBC 9.4 8.2  HGB 11.1* 11.5*  HCT 33.0* 34.4*  PLT 183 207   BMET:  Recent Labs  06/19/13 0345 06/20/13 0405  NA 137 133*  K 3.3* SLIGHT HEMOLYSIS  CL 105 101  CO2 21 25  GLUCOSE 103* 107*  BUN 12 14  CREATININE 0.91 0.85  CALCIUM 7.3* 8.6    PT/INR: No results found for this basename: LABPROT, INR,  in the last 72 hours ABG    Component Value Date/Time   PHART 7.367 06/17/2013 0437   HCO3 20.6 06/17/2013 0437   TCO2 22 06/17/2013 0437   ACIDBASEDEF 4.0* 06/17/2013 0437   O2SAT 96.0 06/17/2013 0437   CBG (last 3)  No results found for this basename: GLUCAP,  in the last 72 hours  Assessment/Plan: S/P Procedure(s) (LRB): VIDEO BRONCHOSCOPY (N/A) VIDEO ASSISTED THORACOSCOPY (VATS)/WEDGE  RESECTION (Right)  1. CV- NSR rate and pressure controlled- continue Hydralazine 2. Pulm- 30cc chest tube output yesterday, no air leak appreciated this morning, tubes on water seal- can likely d/c one tube today, + cough, will add Mucinex to help facilitate cough 3. Deconditioning- PT recs CIR at discharge, will place consult 4. Dispo- patient stable, likely d/c one chest tube today will discuss with staff   LOS: 5 days    [name] 06/21/2013  D/c one chest tube today No air leak I have seen and examined [name] and agree with the above assessment  and plan.  Delight Ovens MD Beeper [phone] Office [phone] 06/21/2013 10:27 AM

## 2013-06-22 ENCOUNTER — Inpatient Hospital Stay (HOSPITAL_COMMUNITY): Payer: Medicare Other

## 2013-06-22 LAB — CBC
HCT: 33.3 % — ABNORMAL LOW (ref 36.0–46.0)
Hemoglobin: 11.4 g/dL — ABNORMAL LOW (ref 12.0–15.0)
MCH: 30.3 pg (ref 26.0–34.0)
MCHC: 34.2 g/dL (ref 30.0–36.0)
MCV: 88.6 fL (ref 78.0–100.0)
Platelets: 233 10*3/uL (ref 150–400)
RBC: 3.76 MIL/uL — ABNORMAL LOW (ref 3.87–5.11)
RDW: 13.4 % (ref 11.5–15.5)
WBC: 7 10*3/uL (ref 4.0–10.5)

## 2013-06-22 LAB — BASIC METABOLIC PANEL
BUN: 10 mg/dL (ref 6–23)
CO2: 26 mEq/L (ref 19–32)
Calcium: 8.6 mg/dL (ref 8.4–10.5)
Chloride: 99 mEq/L (ref 96–112)
Creatinine, Ser: 0.83 mg/dL (ref 0.50–1.10)
GFR calc Af Amer: 74 mL/min — ABNORMAL LOW (ref >90)
GFR calc non Af Amer: 63 mL/min — ABNORMAL LOW (ref >90)
Glucose, Bld: 96 mg/dL (ref 70–99)
Potassium: 4.4 mEq/L (ref 3.5–5.1)
Sodium: 132 mEq/L — ABNORMAL LOW (ref 135–145)

## 2013-06-22 NOTE — Progress Notes (Signed)
Pt. Used BSC, up with 2 assist, prosthesis, and walker. Tolerated ambulation well. Ambulated from [facility] to recliner. Sitting in chair, up for 2 hours at this time.

## 2013-06-22 NOTE — Progress Notes (Signed)
Amil Amen, patient's home aid called. Did not have current password. Explained to Amil Amen unable to give information about patient due to patient privacy. Also patient is a no information patient.

## 2013-06-22 NOTE — Progress Notes (Signed)
CT pulled by RN Meghan. Jomayra Novitsky primary RN observed. Pt. Tolerated well. Will continue to monitor. CXR scheduled for AM.

## 2013-06-22 NOTE — Progress Notes (Signed)
Pt. Has received fent PCA  At 1200N check. Unable to complete task through Epic. Will continue to monitor patient.

## 2013-06-22 NOTE — Progress Notes (Addendum)
      [address]       [place] [address]             [phone]      6 Days Post-Op Procedure(s) (LRB): VIDEO BRONCHOSCOPY (N/A) VIDEO ASSISTED THORACOSCOPY (VATS)/WEDGE RESECTION (Right)  Subjective:  [name] continues to complain of incisional soreness.  She is waiting for PT to work with her today.  Objective: Vital signs in last 24 hours: Temp:  [97.5 F (36.4 C)-98.7 F (37.1 C)] 98.4 F (36.9 C) (11/23 0730) Pulse Rate:  [79-80] 79 (11/23 0750) Cardiac Rhythm:  [-] Normal sinus rhythm (11/23 0805) Resp:  [20-28] 25 (11/23 0800) BP: (133-155)/(49-66) 145/50 mmHg (11/23 0750) SpO2:  [94 %-99 %] 94 % (11/23 0800)  Intake/Output from previous day: 11/22 0701 - 11/23 0700 In: 1583 [P.O.:1080; I.V.:503] Out: 1750 [Urine:1700; Chest Tube:50] Intake/Output this shift: Total I/O In: -  Out: 20 [Chest Tube:20]  General appearance: alert, cooperative and no distress Heart: regular rate and rhythm Lungs: clear to auscultation bilaterally Abdomen: soft, non-tender; bowel sounds normal; no masses,  no organomegaly Wound: clean and dry  Lab Results:  Recent Labs  06/20/13 0405 06/22/13 0419  WBC 8.2 7.0  HGB 11.5* 11.4*  HCT 34.4* 33.3*  PLT 207 233   BMET:  Recent Labs  06/20/13 0405 06/22/13 0419  NA 133* 132*  K SLIGHT HEMOLYSIS 4.4  CL 101 99  CO2 25 26  GLUCOSE 107* 96  BUN 14 10  CREATININE 0.85 0.83  CALCIUM 8.6 8.6    PT/INR: No results found for this basename: LABPROT, INR,  in the last 72 hours ABG    Component Value Date/Time   PHART 7.367 06/17/2013 0437   HCO3 20.6 06/17/2013 0437   TCO2 22 06/17/2013 0437   ACIDBASEDEF 4.0* 06/17/2013 0437   O2SAT 96.0 06/17/2013 0437   CBG (last 3)  No results found for this basename: GLUCAP,  in the last 72 hours  Assessment/Plan: S/P Procedure(s) (LRB): VIDEO BRONCHOSCOPY (N/A) VIDEO ASSISTED THORACOSCOPY (VATS)/WEDGE RESECTION (Right)  1. Chest tube- no air leak  appreciate this morning, 50 cc chest tube output, CXR shows stable appearance of apical pneumothorax 2. HTN- continue Hydralazine BID 3. Deconditioning- awaiting CIR consult, per PT recs 4. Dispo- will discuss possibly removing final chest tube, needs CIR evaluation for placement, consult placed   LOS: 6 days    [name], Nicole 06/22/2013  Will d/c remaining chest tube No help to ambulate today has not been out of bed I have seen and examined [name] and agree with the above assessment  and plan.  Delight Ovens MD Beeper [phone] Office [phone] 06/22/2013 2:04 PM

## 2013-06-23 ENCOUNTER — Inpatient Hospital Stay (HOSPITAL_COMMUNITY): Payer: Medicare Other

## 2013-06-23 ENCOUNTER — Encounter: Payer: Self-pay | Admitting: *Deleted

## 2013-06-23 LAB — CBC
HCT: 32.8 % — ABNORMAL LOW (ref 36.0–46.0)
Hemoglobin: 11.2 g/dL — ABNORMAL LOW (ref 12.0–15.0)
MCH: 30.1 pg (ref 26.0–34.0)
MCHC: 34.1 g/dL (ref 30.0–36.0)
MCV: 88.2 fL (ref 78.0–100.0)
Platelets: 241 10*3/uL (ref 150–400)
RBC: 3.72 MIL/uL — ABNORMAL LOW (ref 3.87–5.11)
RDW: 13.5 % (ref 11.5–15.5)
WBC: 8.4 10*3/uL (ref 4.0–10.5)

## 2013-06-23 LAB — BASIC METABOLIC PANEL
BUN: 12 mg/dL (ref 6–23)
CO2: 24 mEq/L (ref 19–32)
Calcium: 8.4 mg/dL (ref 8.4–10.5)
Chloride: 98 mEq/L (ref 96–112)
Creatinine, Ser: 0.91 mg/dL (ref 0.50–1.10)
GFR calc Af Amer: 66 mL/min — ABNORMAL LOW (ref >90)
GFR calc non Af Amer: 57 mL/min — ABNORMAL LOW (ref >90)
Glucose, Bld: 115 mg/dL — ABNORMAL HIGH (ref 70–99)
Potassium: 4 mEq/L (ref 3.5–5.1)
Sodium: 132 mEq/L — ABNORMAL LOW (ref 135–145)

## 2013-06-23 MED ORDER — LEVALBUTEROL HCL 0.63 MG/3ML IN NEBU
0.6300 mg | INHALATION_SOLUTION | Freq: Four times a day (QID) | RESPIRATORY_TRACT | Status: DC | PRN
  Administered 2013-06-23: 0.63 mg via RESPIRATORY_TRACT
  Filled 2013-06-23: qty 3

## 2013-06-23 NOTE — Progress Notes (Signed)
Physical Therapy Treatment Patient Details Name: [name] MRN: [id number] DOB: [birth year] Today's Date: 06/23/2013 Time: 4010-2725 PT Time Calculation (min): 18 min  PT Assessment / Plan / Recommendation  History of Present Illness Patient is an 79 y.o. female admitted for VATS for right upper lobe mass.  She has h/o right AKA with prosthesis, HTN, stroke, arthritis, glaucoma and PVD.   PT Comments   Pt unable to ambulate this date due to RN removing central line and pt having to return to bed immediately. Pt motivated and desires to undergo rehab. Pt demo's good rehab potential and remains appropriate for CIR upon d/c from hospital to achieve maximal functional rehab for safe transition home.   Follow Up Recommendations  CIR     Does the patient have the potential to tolerate intense rehabilitation     Barriers to Discharge        Equipment Recommendations       Recommendations for Other Services Rehab consult;OT consult  Frequency Min 4X/week   Progress towards PT Goals    Plan Current plan remains appropriate    Precautions / Restrictions Precautions Precautions: Fall Restrictions Weight Bearing Restrictions: No   Pertinent Vitals/Pain Denies pain    Mobility  Bed Mobility Bed Mobility: Supine to Sit;Sit to Supine Supine to Sit: With rails;4: Min assist Sit to Supine: 4: Min assist;With rail Details for Bed Mobility Assistance: definite use of bedrail, increased time, labored effort Transfers Transfers: Sit to Stand;Stand to Sit;Stand Pivot Transfers Sit to Stand: 3: Mod assist Stand to Sit: 3: Mod assist;With upper extremity assist;To toilet;To bed Stand Pivot Transfers: 1: +2 Total assist Stand Pivot Transfers: Patient Percentage: 50% Details for Transfer Assistance: transferred to [facility] with 2 person urgently due to pt needing to have BM. Pt then used RW to return back bed with modAx1.    Exercises     PT Diagnosis:    PT Problem List:   PT Treatment  Interventions:     PT Goals (current goals can now be found in the care plan section)    Visit Information  Last PT Received On: 06/23/13 Assistance Needed: +2 History of Present Illness: Patient is an 79 y.o. female admitted for VATS for right upper lobe mass.  She has h/o right AKA with prosthesis, HTN, stroke, arthritis, glaucoma and PVD.    Subjective Data  Subjective: upon PT arrival pt with request to use [facility]   Cognition  Cognition Arousal/Alertness: Awake/alert Behavior During Therapy: WFL for tasks assessed/performed Overall Cognitive Status: Within Functional Limits for tasks assessed    Balance  Static Standing Balance Static Standing - Balance Support: Bilateral upper extremity supported Static Standing - Level of Assistance: 4: Min assist (with use of RW) Static Standing - Comment/# of Minutes: pt stood x 90 sec for hygiene s/p BM  End of Session PT - End of Session Equipment Utilized During Treatment: Gait belt Activity Tolerance: Patient limited by fatigue Patient left: in bed;with call bell/phone within reach   GP     Marcene Brawn 06/23/2013, 2:38 PM  Lewis Shock, PT, DPT Pager #: [phone] Office #: [phone]

## 2013-06-23 NOTE — Progress Notes (Signed)
Foundation one next generation sequencing received and given to Dr. Arbutus Ped.

## 2013-06-23 NOTE — Progress Notes (Addendum)
TCTS DAILY ICU PROGRESS NOTE                   [name]            [place] [address]          [phone]   7 Days Post-Op Procedure(s) (LRB): VIDEO BRONCHOSCOPY (N/A) VIDEO ASSISTED THORACOSCOPY (VATS)/WEDGE RESECTION (Right)  Total Length of Stay:  LOS: 7 days   Subjective: SOB at times, improving overall  Objective: Vital signs in last 24 hours: Temp:  [98.2 F (36.8 C)-99.3 F (37.4 C)] 98.2 F (36.8 C) (11/24 0336) Pulse Rate:  [69-83] 69 (11/24 0336) Cardiac Rhythm:  [-] Normal sinus rhythm (11/23 2000) Resp:  [18-31] 18 (11/24 0343) BP: (123-153)/(50-74) 130/53 mmHg (11/24 0336) SpO2:  [93 %-97 %] 97 % (11/24 0336)  Filed Weights   06/16/13 1530 06/17/13 0723  Weight: 150 lb 5.7 oz (68.2 kg) 155 lb 10.3 oz (70.6 kg)    Weight change:    Hemodynamic parameters for last 24 hours:    Intake/Output from previous day: 11/23 0701 - 11/24 0700 In: 580 [P.O.:120; I.V.:460] Out: 540 [Urine:500; Chest Tube:40]  Intake/Output this shift:    Current Meds: Scheduled Meds: . bisacodyl  10 mg Oral Daily  . cholecalciferol  1,000 Units Oral Daily  . cycloSPORINE  1 drop Both Eyes Q12H  . enoxaparin (LOVENOX) injection  30 mg Subcutaneous Q24H  . fentaNYL   Intravenous Q4H  . guaiFENesin  1,200 mg Oral BID  . hydrALAZINE  25 mg Oral BID  . pantoprazole  40 mg Oral Daily  . simvastatin  10 mg Oral q1800   Continuous Infusions: . dextrose 5 % and 0.45 % NaCl with KCl 20 mEq/L 20 mL/hr at 06/22/13 0800   PRN Meds:.diphenhydrAMINE, diphenhydrAMINE, naloxone, ondansetron (ZOFRAN) IV, ondansetron (ZOFRAN) IV, oxyCODONE-acetaminophen, potassium chloride, senna-docusate, sodium chloride, traMADol  General appearance: alert, cooperative and no distress Heart: regular rate and rhythm Lungs: wheezes scattered Abdomen: soft, nontender Extremities: no edema Wound: incis healing well  Lab Results: CBC: Recent Labs  06/22/13 0419 06/23/13 0415    WBC 7.0 8.4  HGB 11.4* 11.2*  HCT 33.3* 32.8*  PLT 233 241   BMET:  Recent Labs  06/22/13 0419 06/23/13 0415  NA 132* 132*  K 4.4 4.0  CL 99 98  CO2 26 24  GLUCOSE 96 115*  BUN 10 12  CREATININE 0.83 0.91  CALCIUM 8.6 8.4    PT/INR: No results found for this basename: LABPROT, INR,  in the last 72 hours Radiology: Dg Chest Port 1 View  06/22/2013   CLINICAL DATA:  Status post right upper lobectomy for resection of right upper lobe mass.  EXAM: PORTABLE CHEST - 1 VIEW  COMPARISON:  06/21/2013  FINDINGS: Stable trace pneumothorax at the right apex. One of the two right chest tubes has been removed. Central line positioning is stable. There is no evidence of edema, focal airspace consolidation or pleural fluid.  IMPRESSION: Stable trace pneumothorax at the right apex after removal of one of the right chest tubes.   Electronically Signed   By: Irish Lack M.D.   On: 06/22/2013 09:00     Assessment/Plan: S/P Procedure(s) (LRB): VIDEO BRONCHOSCOPY (N/A) VIDEO ASSISTED THORACOSCOPY (VATS)/WEDGE RESECTION (Right)  1 stable, improving, push rehab as able, ? CIR candidate 2 wheezing- will add xopenex prn. CXR is stable 3 labs stable   [name],[name] 06/23/2013 7:49 AM  With rt above amputation  progress is slower then usuall esp at 38, but patient very motivated Would like to have in IP rehab for 7-10 days the return home to independent living as she was preop D/c pca and central line today I have seen and examined [name] and agree with the above assessment  and plan.  Delight Ovens MD Beeper [phone] Office [phone] 06/23/2013 10:54 AM

## 2013-06-23 NOTE — Progress Notes (Signed)
Fentanyl PCA as ordered. Wasted 10 cc's down the sink. Witnessed by Mickle Asper.

## 2013-06-23 NOTE — Progress Notes (Signed)
Utilization review completed.  

## 2013-06-23 NOTE — Consult Note (Signed)
Physical Medicine and Rehabilitation Consult  Reason for Consult: Deconditioning Referring Physician: Dr. Tyrone Sage   HPI: [name] is a 79 y.o. female with history of CVA, COPD,  PVD-s/p R-AKA, RUL spiculated lung mass who was admitted on 06/16/13 for R-VATS with RUL lobectomy and lymph node dissection. Chest tubes removed this weekend but continues with SOB--nebs added to help with symptoms. Patient deconditioned and therapy team/MD recommending CIR.   Pathology revealed adenocarcinoma of the lung.  Review of Systems  HENT: Positive for hearing loss (left).   Respiratory: Positive for shortness of breath (chronic).    Past Medical History  Diagnosis Date  . Stroke   . Arthritis   . Hypertension   . Hyperlipidemia   . Glaucoma   . Peripheral vascular disease, unspecified     with Claudication  . COPD GOLD I 04/18/2013  . Cancer     rt lung  . Shortness of breath   . Lung cancer 06/07/2013   Past Surgical History  Procedure Laterality Date  . Above knee leg amputation      Right  . Angioplasty / stenting iliac  03/22/09    Aortogram-  left common iliac artery  . Hemorrhoid surgery    . Tonsillectomy    . Colonoscopy  Feb. 2013  . Video bronchoscopy Bilateral 04/10/2013    Procedure: VIDEO BRONCHOSCOPY WITH FLUORO;  Surgeon: Nyoka Cowden, MD;  Location: [facility]COPY;  Service: Cardiopulmonary;  Laterality: Bilateral;  . Video bronchoscopy N/A 06/16/2013    Procedure: VIDEO BRONCHOSCOPY;  Surgeon: Delight Ovens, MD;  Location: [facility] OR;  Service: Thoracic;  Laterality: N/A;  . Video assisted thoracoscopy (vats)/wedge resection Right 06/16/2013    Procedure: VIDEO ASSISTED THORACOSCOPY (VATS)/WEDGE RESECTION;  Surgeon: Delight Ovens, MD;  Location: [facility] OR;  Service: Thoracic;  Laterality: Right;   Family History  Problem Relation Age of Onset  . Stroke Brother   . Stomach cancer Mother   . Asthma Sister   . Colon cancer Brother     Social History:  Lives in  independent living facility. Has PCS aide 8 hours day.  reports that she quit smoking about 3 years ago. Her smoking use included Cigarettes. She has a 30 pack-year smoking history. She has never used smokeless tobacco. She reports that she does not drink alcohol or use illicit drugs.   Allergies  Allergen Reactions  . Aspirin Anaphylaxis  . Morphine And Related Other (See Comments)    confusion    Medications Prior to Admission  Medication Sig Dispense Refill  . acetaminophen (TYLENOL) 500 MG tablet Take 500 mg by mouth 2 (two) times daily as needed for pain.      Marland Kitchen albuterol (PROAIR HFA) 108 (90 BASE) MCG/ACT inhaler Inhale 2 puffs into the lungs every 6 (six) hours as needed for wheezing.      . cholecalciferol (VITAMIN D) 1000 UNITS tablet Take 1,000 Units by mouth daily.      . clopidogrel (PLAVIX) 75 MG tablet Take 75 mg by mouth 3 (three) times a week. Monday, Wednesday, friday      . furosemide (LASIX) 20 MG tablet Take 20 mg by mouth 2 (two) times a week. 1 tablet twice a week on Mon and Thurs as needed for edema.      . hydrALAZINE (APRESOLINE) 25 MG tablet Take 25 mg by mouth 2 (two) times daily.      . meclizine (ANTIVERT) 25 MG tablet Take 25 mg by mouth 3 (three)  times daily as needed. For dizziness      . omeprazole (PRILOSEC) 20 MG capsule Take 20 mg by mouth daily before breakfast.       . pravastatin (PRAVACHOL) 20 MG tablet Take 20 mg by mouth at bedtime.       . RESTASIS 0.05 % ophthalmic emulsion Place 1 drop into both eyes every 12 (twelve) hours.       . Sennosides (SENNA LAX PO) Take 1-2 tablets by mouth daily as needed. For constipation      . traMADol (ULTRAM) 50 MG tablet Take 0.5 tablets (25 mg total) by mouth 2 (two) times daily as needed for pain.  30 tablet      Home: Home Living Family/patient expects to be discharged to:: Private residence Living Arrangements: Alone Available Help at Discharge: Personal care attendant;Other (Comment) (currently 8 hours a  day) Type of Home: Independent living facility Home Access: Elevator Home Layout: One level Home Equipment: Walker - 2 wheels;Wheelchair - manual  Functional History:   Functional Status:  Mobility: Bed Mobility Bed Mobility: Not assessed Rolling Left: 3: Mod assist Left Sidelying to Sit: 3: Mod assist Transfers Transfers: Sit to Stand;Stand to Sit Sit to Stand: 3: Mod assist Sit to Stand: Patient Percentage: 50% Stand to Sit: 3: Mod assist Stand to Sit: Patient Percentage: 50% Ambulation/Gait Ambulation/Gait Assistance: 4: Min assist Ambulation/Gait: Patient Percentage: 70% Ambulation Distance (Feet): 30 Feet Assistive device: Rolling walker Ambulation/Gait Assistance Details: Pt continues to have forward flexion during ambulation with RW too far in front.  Cues for proper RW placement and step sequence.  Gait Pattern: Step-to pattern Stairs: No    ADL:    Cognition: Cognition Overall Cognitive Status: Within Functional Limits for tasks assessed Orientation Level: Oriented X4 Cognition Arousal/Alertness: Awake/alert Behavior During Therapy: WFL for tasks assessed/performed Overall Cognitive Status: Within Functional Limits for tasks assessed  Blood pressure 151/62, pulse 69, temperature 98.5 F (36.9 C), temperature source Oral, resp. rate 20, height 5' (1.524 m), weight 70.6 kg (155 lb 10.3 oz), SpO2 95.00%. Physical Exam  Results for orders placed during the hospital encounter of 06/16/13 (from the past 24 hour(s))  BASIC METABOLIC PANEL     Status: Abnormal   Collection Time    06/23/13  4:15 AM      Result Value Range   Sodium 132 (*) 135 - 145 mEq/L   Potassium 4.0  3.5 - 5.1 mEq/L   Chloride 98  96 - 112 mEq/L   CO2 24  19 - 32 mEq/L   Glucose, Bld 115 (*) 70 - 99 mg/dL   BUN 12  6 - 23 mg/dL   Creatinine, Ser 9.14  0.50 - 1.10 mg/dL   Calcium 8.4  8.4 - 78.2 mg/dL   GFR calc non Af Amer 57 (*) >90 mL/min   GFR calc Af Amer 66 (*) >90 mL/min  CBC      Status: Abnormal   Collection Time    06/23/13  4:15 AM      Result Value Range   WBC 8.4  4.0 - 10.5 K/uL   RBC 3.72 (*) 3.87 - 5.11 MIL/uL   Hemoglobin 11.2 (*) 12.0 - 15.0 g/dL   HCT 95.6 (*) 21.3 - 08.6 %   MCV 88.2  78.0 - 100.0 fL   MCH 30.1  26.0 - 34.0 pg   MCHC 34.1  30.0 - 36.0 g/dL   RDW 57.8  46.9 - 62.9 %   Platelets 241  150 -  400 K/uL   Dg Chest Port 1 View  06/23/2013   CLINICAL DATA:  Chest tube removal.  EXAM: PORTABLE CHEST - 1 VIEW  COMPARISON:  06/22/2013.  FINDINGS: Chest tube on the right has been removed. No pneumothorax. Right chest wall mild subcutaneous emphysema is present. Left IJ line noted in stable position. Stable right hilar and suprahilar soft tissue prominence suggesting adenopathy and/or postsurgical change. Mild bibasilar atelectasis. Stable heart size and pulmonary vascularity. No acute osseous abnormality  IMPRESSION: 1. Interim removal of right chest tube. No pneumothorax. Stable right chest wall subcutaneous emphysema . 2. Stable right hilar and suprahilar fullness consistent with an adenopathy and/or postsurgical change.   Electronically Signed   By: Maisie Fus  Register   On: 06/23/2013 08:07   Dg Chest Port 1 View  06/22/2013   CLINICAL DATA:  Status post right upper lobectomy for resection of right upper lobe mass.  EXAM: PORTABLE CHEST - 1 VIEW  COMPARISON:  06/21/2013  FINDINGS: Stable trace pneumothorax at the right apex. One of the two right chest tubes has been removed. Central line positioning is stable. There is no evidence of edema, focal airspace consolidation or pleural fluid.  IMPRESSION: Stable trace pneumothorax at the right apex after removal of one of the right chest tubes.   Electronically Signed   By: Irish Lack M.D.   On: 06/22/2013 09:00    Assessment/Plan: Diagnosis: Deconditioning related to adeno carcinoma of the Lung ( RUL ) status post resection 1. Does the need for close, 24 hr/day medical supervision in concert with the  patient's rehab needs make it unreasonable for this patient to be served in a less intensive setting? Yes 2. Co-Morbidities requiring supervision/potential complications: Right above-knee amputation, peripheral vascular disease, hypertension, hyperlipidemia, COPD 3. Due to bladder management, bowel management, safety, skin/wound care, disease management, medication administration, pain management and patient education, does the patient require 24 hr/day rehab nursing? Yes 4. Does the patient require coordinated care of a physician, rehab nurse, PT (1-2 hrs/day, 5 days/week) and OT (1-2 hrs/day, 5 days/week) to address physical and functional deficits in the context of the above medical diagnosis(es)? Yes Addressing deficits in the following areas: balance, endurance, locomotion, strength, transferring, bowel/bladder control, bathing, dressing, feeding and toileting 5. Can the patient actively participate in an intensive therapy program of at least 3 hrs of therapy per day at least 5 days per week? Yes 6. The potential for patient to make measurable gains while on inpatient rehab is excellent 7. Anticipated functional outcomes upon discharge from inpatient rehab are modified independent mobility with PT, modified independent ADLs with OT, NA with SLP. 8. Estimated rehab length of stay to reach the above functional goals is: 7-10 days 9. Does the patient have adequate social supports to accommodate these discharge functional goals? Yes 10. Anticipated D/C setting: Home 11. Anticipated post D/C treatments: HH therapy 12. Overall Rehab/Functional Prognosis: good  RECOMMENDATIONS: This patient's condition is appropriate for continued rehabilitative care in the following setting: CIR Patient has agreed to participate in recommended program. No Note that insurance prior authorization may be required for reimbursement for recommended care.  Comment: Patient would  prefer to go home with home health. It is  unclear whether she has somebody to stay with her 24 7    06/23/2013

## 2013-06-24 NOTE — Progress Notes (Addendum)
TCTS DAILY ICU PROGRESS NOTE                   [address]            [place] [address]          [phone]   8 Days Post-Op Procedure(s) (LRB): VIDEO BRONCHOSCOPY (N/A) VIDEO ASSISTED THORACOSCOPY (VATS)/WEDGE RESECTION (Right)  Total Length of Stay:  LOS: 8 days   Subjective: Feels like she is stronger, C/O intermittent wheezing associated with ambulation but not at rest  Objective: Vital signs in last 24 hours: Temp:  [97.9 F (36.6 C)-98.4 F (36.9 C)] 98.4 F (36.9 C) (11/25 0755) Pulse Rate:  [78-97] 78 (11/25 0755) Cardiac Rhythm:  [-] Normal sinus rhythm (11/25 0406) Resp:  [19-33] 33 (11/25 0755) BP: (62-151)/(46-62) 139/62 mmHg (11/25 0755) SpO2:  [98 %] 98 % (11/25 0755)  Filed Weights   06/16/13 1530 06/17/13 0723  Weight: 150 lb 5.7 oz (68.2 kg) 155 lb 10.3 oz (70.6 kg)    Weight change:    Hemodynamic parameters for last 24 hours:    Intake/Output from previous day: 11/24 0701 - 11/25 0700 In: 360 [P.O.:360] Out: 150 [Urine:150]  Intake/Output this shift:    Current Meds: Scheduled Meds: . bisacodyl  10 mg Oral Daily  . cholecalciferol  1,000 Units Oral Daily  . cycloSPORINE  1 drop Both Eyes Q12H  . enoxaparin (LOVENOX) injection  30 mg Subcutaneous Q24H  . guaiFENesin  1,200 mg Oral BID  . hydrALAZINE  25 mg Oral BID  . pantoprazole  40 mg Oral Daily  . simvastatin  10 mg Oral q1800   Continuous Infusions:  PRN Meds:.levalbuterol, ondansetron (ZOFRAN) IV, oxyCODONE-acetaminophen, potassium chloride, senna-docusate, traMADol  General appearance: alert, cooperative and no distress Heart: regular rate and rhythm Lungs: clear to auscultation bilaterally and no current wheeze Abdomen: benign Wound: incis healing well  Lab Results: CBC: Recent Labs  06/22/13 0419 06/23/13 0415  WBC 7.0 8.4  HGB 11.4* 11.2*  HCT 33.3* 32.8*  PLT 233 241   BMET:  Recent Labs  06/22/13 0419 06/23/13 0415  NA 132* 132*  K  4.4 4.0  CL 99 98  CO2 26 24  GLUCOSE 96 115*  BUN 10 12  CREATININE 0.83 0.91  CALCIUM 8.6 8.4    PT/INR: No results found for this basename: LABPROT, INR,  in the last 72 hours Radiology: Dg Chest Port 1 View  06/23/2013   CLINICAL DATA:  Chest tube removal.  EXAM: PORTABLE CHEST - 1 VIEW  COMPARISON:  06/22/2013.  FINDINGS: Chest tube on the right has been removed. No pneumothorax. Right chest wall mild subcutaneous emphysema is present. Left IJ line noted in stable position. Stable right hilar and suprahilar soft tissue prominence suggesting adenopathy and/or postsurgical change. Mild bibasilar atelectasis. Stable heart size and pulmonary vascularity. No acute osseous abnormality  IMPRESSION: 1. Interim removal of right chest tube. No pneumothorax. Stable right chest wall subcutaneous emphysema . 2. Stable right hilar and suprahilar fullness consistent with an adenopathy and/or postsurgical change.   Electronically Signed   By: Maisie Fus  Register   On: 06/23/2013 08:07     Assessment/Plan: S/P Procedure(s) (LRB): VIDEO BRONCHOSCOPY (N/A) VIDEO ASSISTED THORACOSCOPY (VATS)/WEDGE RESECTION (Right)  1 conts to progress, await rehab consult 2 cont to push pulm rx/toilet    GOLD,WAYNE E 06/24/2013 9:57 AM I have seen and examined [name] and agree with the above assessment  and plan.  Delight Ovens MD Beeper [phone] Office [phone] 06/24/2013 10:00 AM

## 2013-06-24 NOTE — Progress Notes (Signed)
I will follow up in the morning to assess if pt most appropriate for inpt rehab vs Home. [phone]

## 2013-06-24 NOTE — Progress Notes (Signed)
Physical Therapy Treatment Patient Details Name: [name] MRN: [id number] DOB: [birth year] Today's Date: 06/24/2013 Time: 4098-1191 PT Time Calculation (min): 26 min  PT Assessment / Plan / Recommendation  History of Present Illness Patient is an 79 y.o. female admitted for VATS for right upper lobe mass.  She has h/o right AKA with prosthesis, HTN, stroke, arthritis, glaucoma and PVD.   PT Comments   Pt tolerated ambulation well this date. Pt most limited by deconditioning and SOB with activity. Pt received HHA 2hr/day 7x/wk and was home alone the remainder of the time and was able to function at mod I. Pt remains appropriate for CIR to achieve safe mod I function for safe transition home.   Follow Up Recommendations  CIR     Does the patient have the potential to tolerate intense rehabilitation     Barriers to Discharge        Equipment Recommendations       Recommendations for Other Services Rehab consult;OT consult  Frequency Min 4X/week   Progress towards PT Goals Progress towards PT goals: Progressing toward goals  Plan Current plan remains appropriate    Precautions / Restrictions Precautions Precautions: Fall Restrictions Weight Bearing Restrictions: No   Pertinent Vitals/Pain Denies pain, vital stable, however unable to get an accurate SpO2 reading due to pt sweating.    Mobility  Bed Mobility Bed Mobility: Not assessed Transfers Transfers: Sit to Stand;Stand to Sit;Stand Pivot Transfers Sit to Stand: 3: Mod assist;4: Min assist;With upper extremity assist;From bed Stand to Sit: 4: Min assist;3: Mod assist;With upper extremity assist Details for Transfer Assistance: assist to maintain balance in standing to don R LE prosthesis. v/c's for safe hand placement. minA to achieve full upright standing. + wheezing/labored effort Ambulation/Gait Ambulation/Gait Assistance: 4: Min assist Ambulation Distance (Feet): 100 Feet Assistive device: Rolling  walker Ambulation/Gait Assistance Details: + wheezing, + SOB, unable to get O2 reading due to pt sweating. RR 30 Gait Pattern: Step-through pattern;Decreased stride length Gait velocity: wfl General Gait Details: increased UE WBing Stairs: No    Exercises     PT Diagnosis:    PT Problem List:   PT Treatment Interventions:     PT Goals (current goals can now be found in the care plan section)    Visit Information  Last PT Received On: 06/24/13 Assistance Needed: +2 History of Present Illness: Patient is an 79 y.o. female admitted for VATS for right upper lobe mass.  She has h/o right AKA with prosthesis, HTN, stroke, arthritis, glaucoma and PVD.    Subjective Data  Subjective: pt sitting EOB agreeable to PT and eager to ambulate   Cognition  Cognition Arousal/Alertness: Awake/alert Behavior During Therapy: WFL for tasks assessed/performed Overall Cognitive Status: Within Functional Limits for tasks assessed    Balance  Balance Balance Assessed: Yes Static Sitting Balance Static Sitting - Balance Support: No upper extremity supported;Feet supported Static Sitting - Level of Assistance: 6: Modified independent (Device/Increase time) Static Sitting - Comment/# of Minutes: 3 min Dynamic Sitting Balance Dynamic Sitting - Balance Support: During functional activity (donning R LE shrink socks) Dynamic Sitting - Level of Assistance: 5: Stand by assistance Dynamic Sitting - Comments: pt donned shrink sock indep however labored effort. Pt required minA for donning of R LE prosthesis  End of Session PT - End of Session Equipment Utilized During Treatment: Gait belt Activity Tolerance: Patient tolerated treatment well Patient left: in chair;with call bell/phone within reach Nurse Communication: Mobility status   GP  Marcene Brawn 06/24/2013, 10:23 AM  Lewis Shock, PT, DPT Pager #: [phone] Office #: [phone]

## 2013-06-24 NOTE — Progress Notes (Signed)
Nutrition Brief Note  Patient identified on the Malnutrition Screening Tool (MST) Report for unsure of any weight loss. Per review of usual weights below and discussion with patient, no significant weight loss PTA. PO intake PTA was very good.   Wt Readings from Last 15 Encounters:  06/17/13 155 lb 10.3 oz (70.6 kg)  06/17/13 155 lb 10.3 oz (70.6 kg)  06/12/13 158 lb 8 oz (71.895 kg)  06/09/13 159 lb (72.122 kg)  06/05/13 159 lb 9.6 oz (72.394 kg)  05/29/13 163 lb (73.936 kg)  05/15/13 160 lb (72.576 kg)  05/13/13 160 lb (72.576 kg)  05/12/13 160 lb (72.576 kg)  04/18/13 163 lb (73.936 kg)  04/11/13 154 lb 3.2 oz (69.945 kg)  04/11/13 154 lb 3.2 oz (69.945 kg)  04/11/13 154 lb 3.2 oz (69.945 kg)  03/28/13 163 lb 3.2 oz (74.027 kg)  03/19/13 163 lb (73.936 kg)    Body mass index is 30.4 kg/(m^2). Patient meets criteria for obesity, class 1 based on current BMI.   Current diet order is regular, patient is consuming approximately 50% of meals at this time. Labs and medications reviewed.   No nutrition interventions warranted at this time. If nutrition issues arise, please consult RD.    Joaquin Courts, RD, LDN, CNSC Pager [phone] After Hours Pager [phone]

## 2013-06-25 ENCOUNTER — Ambulatory Visit: Payer: Medicare Other | Admitting: Podiatrist

## 2013-06-25 MED ORDER — OXYCODONE-ACETAMINOPHEN 5-325 MG PO TABS: 1.0000 | ORAL_TABLET | ORAL | Status: DC | PRN

## 2013-06-25 NOTE — Progress Notes (Signed)
       [address]       [place] [address]             [phone]          9 Days Post-Op Procedure(s) (LRB): VIDEO BRONCHOSCOPY (N/A) VIDEO ASSISTED THORACOSCOPY (VATS)/WEDGE RESECTION (Right)  Subjective: Doing well, wants to go home.   Objective: Vital signs in last 24 hours: Patient Vitals for the past 24 hrs:  BP Temp Temp src Pulse Resp SpO2  06/25/13 0800 124/66 mmHg 97.5 F (36.4 C) Oral 77 - 98 %  06/25/13 0400 106/69 mmHg 97.4 F (36.3 C) Oral 78 23 96 %  06/24/13 2305 135/57 mmHg 98 F (36.7 C) Oral 81 22 93 %  06/24/13 2000 143/62 mmHg 98.2 F (36.8 C) Oral 79 25 97 %  06/24/13 1640 131/56 mmHg 97.5 F (36.4 C) Oral 74 30 99 %  06/24/13 1124 128/50 mmHg 97.5 F (36.4 C) Oral 68 26 98 %   Current Weight  06/17/13 155 lb 10.3 oz (70.6 kg)     Intake/Output from previous day: 11/25 0701 - 11/26 0700 In: 820 [P.O.:820] Out: -     PHYSICAL EXAM:  Heart: RRR Lungs: Clear Wound: Clean and dry     Lab Results: CBC: Recent Labs  06/23/13 0415  WBC 8.4  HGB 11.2*  HCT 32.8*  PLT 241   BMET:  Recent Labs  06/23/13 0415  NA 132*  K 4.0  CL 98  CO2 24  GLUCOSE 115*  BUN 12  CREATININE 0.91  CALCIUM 8.4    PT/INR: No results found for this basename: LABPROT, INR,  in the last 72 hours    Assessment/Plan: S/P Procedure(s) (LRB): VIDEO BRONCHOSCOPY (N/A) VIDEO ASSISTED THORACOSCOPY (VATS)/WEDGE RESECTION (Right) Stable from surgical standpoint. No beds available on CIR and pt at baseline mobility.  CM has evaluated, and pt has a HH aide to assist during the day and family will stay with her at night.  We will arrange HHRN/PT/OT and she can go home today with assistance.   LOS: 9 days    Karley Pho H 06/25/2013

## 2013-06-25 NOTE — Progress Notes (Signed)
Pt to d/c home today, w/arrangements for [facility] per CM.

## 2013-06-25 NOTE — Progress Notes (Signed)
Arrangements made to d/c home with home health. We will sign off. [phone]

## 2013-07-03 DIAGNOSIS — Z483 Aftercare following surgery for neoplasm: Secondary | ICD-10-CM

## 2013-07-16 ENCOUNTER — Ambulatory Visit: Payer: Medicare Other | Admitting: Podiatrist

## 2013-07-17 ENCOUNTER — Ambulatory Visit
Admission: RE | Admit: 2013-07-17 | Discharge: 2013-07-17 | Disposition: A | Discharge status: Home / Self Care | Payer: Medicare Other | Source: Ambulatory Visit | Attending: Cardiothoracic Surgery | Admitting: Cardiothoracic Surgery

## 2013-07-17 ENCOUNTER — Encounter: Payer: Self-pay | Admitting: Cardiothoracic Surgery

## 2013-07-17 ENCOUNTER — Ambulatory Visit (INDEPENDENT_AMBULATORY_CARE_PROVIDER_SITE_OTHER): Payer: Self-pay | Admitting: Cardiothoracic Surgery

## 2013-07-17 ENCOUNTER — Other Ambulatory Visit: Payer: Self-pay | Admitting: *Deleted

## 2013-07-17 DIAGNOSIS — C3410 Malignant neoplasm of upper lobe, unspecified bronchus or lung: Secondary | ICD-10-CM

## 2013-07-17 DIAGNOSIS — R918 Other nonspecific abnormal finding of lung field: Secondary | ICD-10-CM

## 2013-07-17 DIAGNOSIS — Z902 Acquired absence of lung [part of]: Secondary | ICD-10-CM

## 2013-07-17 DIAGNOSIS — Z9889 Other specified postprocedural states: Secondary | ICD-10-CM

## 2013-07-17 DIAGNOSIS — Z09 Encounter for follow-up examination after completed treatment for conditions other than malignant neoplasm: Secondary | ICD-10-CM

## 2013-07-17 NOTE — Progress Notes (Signed)
[address]       [place] [address]             [phone]                  [facility] Medical Record #[id number] Date of Birth: [birth year]  Nyoka Cowden, MD Florentina Jenny, MD  Chief Complaint:   PostOp Follow Up Visit 06/16/2013  OPERATIVE REPORT  PREOPERATIVE DIAGNOSIS: Right upper lobe lung cancer.  POSTOPERATIVE DIAGNOSIS: Right upper lobe lung cancer.  SURGICAL PROCEDURE: Bronchoscopy, right video-assisted thoracoscopy,  minithoracotomy, right upper lobectomy with lymph node dissection, and  placement of On-Q device.  SURGEON: Sheliah Plane, MD  PATH: Stage IB (pT2a,pN0, cM0) ADENOCARCINOMA WITH FOCAL SARCOMATOUS DIFFERENTIATION Diagnosis 1. Lung, resection (segmental or lobe), Right upper lobe - ADENOCARCINOMA WITH FOCAL SARCOMATOUS DIFFERENTIATION, 3.5 CM. - FOCAL VISCERAL PLEURAL INVOLVEMENT. - BRONCHIAL MARGIN FREE OF TUMOR. - ONE BENIGN HILAR LYMPH NODE. 2. Lymph node, biopsy, 10 R - ANTHRACOTIC LYMPH NODE. NO EVIDENCE OF MALIGNANCY. 3. Lymph node, biopsy, 11 R - ANTHRACOTIC LYMPH NODE. NO EVIDENCE OF MALIGNANCY. 4. Lymph node, biopsy, 10 R #2 - ANTHRACOTIC LYMPH NODE. NO EVIDENCE OF MALIGNANCY. 5. Lymph node, biopsy, 4 R azygos - ANTHRACOTIC LYMPH NODE. NO EVIDENCE OF MALIGNANCY. 6. Lymph node, biopsy, 2 R - ANTHRACOTIC LYMPH NODE. NO EVIDENCE OF MALIGNANCY. Microscopic Comment 1. LUNG Specimen, including laterality: Right upper lung lobe Procedure: Lobectomy Specimen integrity (intact/disrupted): Intact Tumor site: Right upper lobe Tumor focality: Unifocal Maximum tumor size (cm): 3.5 cm Histologic type: Adenocarcinoma with focal sarcomatous differentiation Grade: 2 Margins: Free of tumor Distance to closest margin (cm): 2.5 cm from bronchial margin Visceral pleura invasion: Present, focal Tumor extension: Focal visceral pleural involvement Treatment effect (if treated with neoadjuvant therapy): No Lymph  -Vascular invasion: No Lymph nodes: Number examined - 6 ; Number N1 nodes positive 0 ; Number N2 nodes positive 0 1 of 3 FINAL for Fellows, Tara F ([id number]) Microscopic Comment(continued) TNM code: pT2a , pN0 Ancillary studies: Can be performed if requested Non-neoplastic lung: Unremarkable Comments: The tumor is a moderately differentiated adenocarcinoma which is 3.5 cm in greatest dimension and has focal sarcomatous differentiation primarily seen in the recut of the frozen section (slide 1B). (JDP:caf 06/17/13) Jimmy Picket MD  History of Present Illness:      Patient returns today for her first postop visit, considering her age of 59 years and above-the-knee amputation he is doing extremely well postoperatively. She is very motivated and is returning to near-normal activities. She has had physical therapy working with her at home. She denies any respiratory difficulty. She wants to increase her level of activity.   History  Smoking status  . Former Smoker -- 0.50 packs/day for 60 years  . Types: Cigarettes  . Quit date: 07/31/2009  Smokeless tobacco  . Never Used       Allergies  Allergen Reactions  . Aspirin Anaphylaxis  . Morphine And Related Other (See Comments)    confusion    Current Outpatient Prescriptions  Medication Sig Dispense Refill  . albuterol (PROAIR HFA) 108 (90 BASE) MCG/ACT inhaler Inhale 2 puffs into the lungs every 6 (six) hours as needed for wheezing.      . cholecalciferol (VITAMIN D) 1000 UNITS tablet Take 1,000 Units by mouth daily.      . clopidogrel (PLAVIX) 75 MG tablet Take 75 mg by mouth 3 (three) times a week. Monday, Wednesday, friday      .  furosemide (LASIX) 20 MG tablet Take 20 mg by mouth 2 (two) times a week. 1 tablet twice a week on Mon and Thurs as needed for edema.      . hydrALAZINE (APRESOLINE) 25 MG tablet Take 25 mg by mouth 2 (two) times daily.      . meclizine (ANTIVERT) 25 MG tablet Take 25 mg by mouth 3 (three) times daily  as needed. For dizziness      . omeprazole (PRILOSEC) 20 MG capsule Take 20 mg by mouth daily before breakfast.       . oxyCODONE-acetaminophen (PERCOCET/ROXICET) 5-325 MG per tablet Take 1-2 tablets by mouth every 4 (four) hours as needed for severe pain.  30 tablet  0  . pravastatin (PRAVACHOL) 20 MG tablet Take 20 mg by mouth at bedtime.       . RESTASIS 0.05 % ophthalmic emulsion Place 1 drop into both eyes every 12 (twelve) hours.       . Sennosides (SENNA LAX PO) Take 1-2 tablets by mouth daily as needed. For constipation      . traMADol (ULTRAM) 50 MG tablet Take 0.5 tablets (25 mg total) by mouth 2 (two) times daily as needed for pain.  30 tablet     No current facility-administered medications for this visit.       Physical Exam: BP 180/78  Pulse 94  Resp 20  Ht 5' (1.524 m)  Wt 155 lb (70.308 kg)  BMI 30.27 kg/m2  SpO2 95%  General appearance: alert, cooperative and appears stated age Neurologic: intact Heart: regular rate and rhythm, S1, S2 normal, no murmur, click, rub or gallop Lungs: clear to auscultation bilaterally Abdomen: soft, non-tender; bowel sounds normal; no masses,  no organomegaly Extremities: No evidence of DVT in her leg, her prosthesis is in place Wound: Port sites an incision right chest are well-healed   Diagnostic Studies & Laboratory data:         Recent Radiology Findings: Dg Chest 2 View  07/17/2013   CLINICAL DATA:  Follow-up lung mass  EXAM: CHEST  2 VIEW  COMPARISON:  06/23/2013  FINDINGS: Postop lobectomy on the right with elevation of the right hemidiaphragm and mild scarring in the right lung base.  Negative for heart failure or pneumonia. No pleural effusion. Negative for pneumothorax.  IMPRESSION: Postop changes on the right.  No superimposed acute abnormality.   Electronically Signed   By: Marlan Palau M.D.   On: 07/17/2013 11:31      Recent Labs: Lab Results  Component Value Date   WBC 8.4 06/23/2013   HGB 11.2* 06/23/2013    HCT 32.8* 06/23/2013   PLT 241 06/23/2013   GLUCOSE 115* 06/23/2013   ALT 11 06/18/2013   AST 21 06/18/2013   NA 132* 06/23/2013   K 4.0 06/23/2013   CL 98 06/23/2013   CREATININE 0.91 06/23/2013   BUN 12 06/23/2013   CO2 24 06/23/2013   INR 1.02 06/12/2013      Assessment / Plan:   Patient doing well following resection of what was clinically Stage II carcinoma the lung, but was found pathologically to be stage IB. Foundation [facility] and [facility]  genetic testing has been completed and is scanned into the patient's chart. I've referred the patient to Dr. Mohammed/medical oncology for recommendations on further followup related to her genetic testing. I plan to see her back in one month with a followup chest x-ray. I discussed with her the long-term plan of serial CT scans of  the chest to follow possible recurrence.     Palin Tristan B 07/17/2013 1:06 PM

## 2013-07-21 ENCOUNTER — Telehealth: Payer: Self-pay | Admitting: *Deleted

## 2013-07-21 NOTE — Telephone Encounter (Signed)
Spoke with pt regarding appt with Dr. Arbutus Ped 07/28/13 arrive at 10:30, labs at 10:45 and appt with Dr. Arbutus Ped 11:00.  She verbalized understanding of appt time and place.

## 2013-07-22 ENCOUNTER — Telehealth: Payer: Self-pay | Admitting: *Deleted

## 2013-07-22 NOTE — Telephone Encounter (Signed)
Called and reschedule appt 07/28/13 at 3:45.  She verbalized understanding of time and place of appt

## 2013-07-25 ENCOUNTER — Other Ambulatory Visit: Payer: Self-pay | Admitting: *Deleted

## 2013-07-28 ENCOUNTER — Encounter (INDEPENDENT_AMBULATORY_CARE_PROVIDER_SITE_OTHER): Payer: Self-pay

## 2013-07-28 ENCOUNTER — Encounter: Payer: Self-pay | Admitting: Internal Medicine

## 2013-07-28 ENCOUNTER — Ambulatory Visit (HOSPITAL_BASED_OUTPATIENT_CLINIC_OR_DEPARTMENT_OTHER): Payer: Medicare Other | Admitting: Internal Medicine

## 2013-07-28 ENCOUNTER — Telehealth: Payer: Self-pay | Admitting: Internal Medicine

## 2013-07-28 ENCOUNTER — Ambulatory Visit (HOSPITAL_BASED_OUTPATIENT_CLINIC_OR_DEPARTMENT_OTHER): Payer: Medicare Other

## 2013-07-28 DIAGNOSIS — R0609 Other forms of dyspnea: Secondary | ICD-10-CM

## 2013-07-28 DIAGNOSIS — R5381 Other malaise: Secondary | ICD-10-CM

## 2013-07-28 DIAGNOSIS — C3410 Malignant neoplasm of upper lobe, unspecified bronchus or lung: Secondary | ICD-10-CM

## 2013-07-28 DIAGNOSIS — R0989 Other specified symptoms and signs involving the circulatory and respiratory systems: Secondary | ICD-10-CM

## 2013-07-28 DIAGNOSIS — I739 Peripheral vascular disease, unspecified: Secondary | ICD-10-CM

## 2013-07-28 DIAGNOSIS — I10 Essential (primary) hypertension: Secondary | ICD-10-CM

## 2013-07-28 LAB — CBC WITH DIFFERENTIAL/PLATELET
BASO%: 1.2 % (ref 0.0–2.0)
Basophils Absolute: 0.1 10*3/uL (ref 0.0–0.1)
EOS%: 2.4 % (ref 0.0–7.0)
Eosinophils Absolute: 0.1 10*3/uL (ref 0.0–0.5)
HCT: 37.1 % (ref 34.8–46.6)
HGB: 12.2 g/dL (ref 11.6–15.9)
LYMPH%: 43.3 % (ref 14.0–49.7)
MCH: 29 pg (ref 25.1–34.0)
MCHC: 32.8 g/dL (ref 31.5–36.0)
MCV: 88.6 fL (ref 79.5–101.0)
MONO#: 0.6 10*3/uL (ref 0.1–0.9)
MONO%: 9.4 % (ref 0.0–14.0)
NEUT#: 2.6 10*3/uL (ref 1.5–6.5)
NEUT%: 43.7 % (ref 38.4–76.8)
Platelets: 316 10*3/uL (ref 145–400)
RBC: 4.18 10*6/uL (ref 3.70–5.45)
RDW: 13.8 % (ref 11.2–14.5)
WBC: 6.1 10*3/uL (ref 3.9–10.3)
lymph#: 2.6 10*3/uL (ref 0.9–3.3)

## 2013-07-28 LAB — COMPREHENSIVE METABOLIC PANEL (CC13)
ALT: 10 U/L (ref 0–55)
AST: 15 U/L (ref 5–34)
Albumin: 3.3 g/dL — ABNORMAL LOW (ref 3.5–5.0)
Alkaline Phosphatase: 62 U/L (ref 40–150)
Anion Gap: 11 mEq/L (ref 3–11)
BUN: 7.5 mg/dL (ref 7.0–26.0)
CO2: 23 mEq/L (ref 22–29)
Calcium: 9 mg/dL (ref 8.4–10.4)
Chloride: 106 mEq/L (ref 98–109)
Creatinine: 0.9 mg/dL (ref 0.6–1.1)
Glucose: 103 mg/dl (ref 70–140)
Potassium: 4.3 mEq/L (ref 3.5–5.1)
Sodium: 140 mEq/L (ref 136–145)
Total Bilirubin: <0.2 mg/dL (ref 0.20–1.20)
Total Protein: 6.7 g/dL (ref 6.4–8.3)

## 2013-07-28 NOTE — Progress Notes (Signed)
[facility] Telephone:([phone]   [phone]  OFFICE PROGRESS NOTE  Florentina Jenny, MD [phone] [address] Laurell Joseph[address] [place] [address]  DIAGNOSIS: Stage IB (T2a, N0, M0) non-small cell lung cancer consistent with moderately differentiated adenocarcinoma diagnosed in September of 2014.  PRIOR THERAPY: Bronchoscopy, right video-assisted thoracoscopy, minithoracotomy, right upper lobectomy with lymph node dissection under the care of Dr. Tyrone Sage on 06/16/2013.  CURRENT THERAPY: Observation.  INTERVAL HISTORY: [name] 79 y.o. female returns to the clinic today for followup visit accompanied by her caregiver. The patient is feeling fine today with no specific complaints except for mild fatigue and shortness of breath with exertion. She came on the wheelchair, but she normally uses a walker at home. She tolerated her recent surgery fairly well. The final tumor size was 3.5 CM. The tissue block was sent to [facility]. The final results showed that the patient is at high risk for recurrence. She is here today for evaluation and discussion of her treatment options. She denied having any significant weight loss or night sweats. She denied having any fever or chills.   MEDICAL HISTORY: Past Medical History  Diagnosis Date  . Stroke   . Arthritis   . Hypertension   . Hyperlipidemia   . Glaucoma   . Peripheral vascular disease, unspecified     with Claudication  . COPD GOLD I 04/18/2013  . Cancer     rt lung  . Shortness of breath   . Lung cancer 06/07/2013    ALLERGIES:  is allergic to aspirin and morphine and related.  MEDICATIONS:  Current Outpatient Prescriptions  Medication Sig Dispense Refill  . albuterol (PROAIR HFA) 108 (90 BASE) MCG/ACT inhaler Inhale 2 puffs into the lungs every 6 (six) hours as needed for wheezing.      . cholecalciferol (VITAMIN D) 1000 UNITS tablet Take 1,000 Units by mouth daily.      . clopidogrel (PLAVIX) 75 MG  tablet Take 75 mg by mouth 3 (three) times a week. Monday, Wednesday, friday      . furosemide (LASIX) 20 MG tablet Take 20 mg by mouth 2 (two) times a week. 1 tablet twice a week on Mon and Thurs as needed for edema.      . hydrALAZINE (APRESOLINE) 25 MG tablet Take 25 mg by mouth 2 (two) times daily.      . meclizine (ANTIVERT) 25 MG tablet Take 25 mg by mouth 3 (three) times daily as needed. For dizziness      . omeprazole (PRILOSEC) 20 MG capsule Take 20 mg by mouth daily before breakfast.       . oxyCODONE-acetaminophen (PERCOCET/ROXICET) 5-325 MG per tablet Take 1-2 tablets by mouth every 4 (four) hours as needed for severe pain.  30 tablet  0  . pravastatin (PRAVACHOL) 20 MG tablet Take 20 mg by mouth at bedtime.       . RESTASIS 0.05 % ophthalmic emulsion Place 1 drop into both eyes every 12 (twelve) hours.       . Sennosides (SENNA LAX PO) Take 1-2 tablets by mouth daily as needed. For constipation      . traMADol (ULTRAM) 50 MG tablet Take 0.5 tablets (25 mg total) by mouth 2 (two) times daily as needed for pain.  30 tablet     No current facility-administered medications for this visit.    SURGICAL HISTORY:  Past Surgical History  Procedure Laterality Date  . Above knee  leg amputation      Right  . Angioplasty / stenting iliac  03/22/09    Aortogram-  left common iliac artery  . Hemorrhoid surgery    . Tonsillectomy    . Colonoscopy  Feb. 2013  . Video bronchoscopy Bilateral 04/10/2013    Procedure: VIDEO BRONCHOSCOPY WITH FLUORO;  Surgeon: Nyoka Cowden, MD;  Location: [facility];  Service: Cardiopulmonary;  Laterality: Bilateral;  . Video bronchoscopy N/A 06/16/2013    Procedure: VIDEO BRONCHOSCOPY;  Surgeon: Delight Ovens, MD;  Location: [facility] OR;  Service: Thoracic;  Laterality: N/A;  . Video assisted thoracoscopy (vats)/wedge resection Right 06/16/2013    Procedure: VIDEO ASSISTED THORACOSCOPY (VATS)/WEDGE RESECTION;  Surgeon: Delight Ovens, MD;  Location: [facility] OR;   Service: Thoracic;  Laterality: Right;    REVIEW OF SYSTEMS:  Constitutional: positive for fatigue Eyes: negative Ears, nose, mouth, throat, and face: negative Respiratory: positive for dyspnea on exertion Cardiovascular: negative Gastrointestinal: negative Genitourinary:negative Integument/breast: negative Hematologic/lymphatic: negative Musculoskeletal:positive for muscle weakness Neurological: negative Behavioral/Psych: negative Endocrine: negative Allergic/Immunologic: negative   PHYSICAL EXAMINATION: General appearance: alert, cooperative, fatigued and no distress Head: Normocephalic, without obvious abnormality, atraumatic Neck: no adenopathy, no JVD, supple, symmetrical, trachea midline and thyroid not enlarged, symmetric, no tenderness/mass/nodules Lymph nodes: Cervical, supraclavicular, and axillary nodes normal. Resp: clear to auscultation bilaterally Back: symmetric, no curvature. ROM normal. No CVA tenderness. Cardio: regular rate and rhythm, S1, S2 normal, no murmur, click, rub or gallop GI: soft, non-tender; bowel sounds normal; no masses,  no organomegaly Extremities: extremities normal, atraumatic, no cyanosis or edema Neurologic: Alert and oriented X 3, normal strength and tone. Normal symmetric reflexes. Normal coordination and gait  ECOG PERFORMANCE STATUS: 2 - Symptomatic, <50% confined to bed  Blood pressure 152/60, pulse 86, temperature 98.3 F (36.8 C), temperature source Oral, resp. rate 18, height 5' (1.524 m), weight 157 lb 9.6 oz (71.487 kg).  LABORATORY DATA: Lab Results  Component Value Date   WBC 6.1 07/28/2013   HGB 12.2 07/28/2013   HCT 37.1 07/28/2013   MCV 88.6 07/28/2013   PLT 316 07/28/2013      Chemistry      Component Value Date/Time   NA 132* 06/23/2013 0415   K 4.0 06/23/2013 0415   CL 98 06/23/2013 0415   CO2 24 06/23/2013 0415   BUN 12 06/23/2013 0415   CREATININE 0.91 06/23/2013 0415      Component Value Date/Time    CALCIUM 8.4 06/23/2013 0415   ALKPHOS 43 06/18/2013 0445   AST 21 06/18/2013 0445   ALT 11 06/18/2013 0445   BILITOT 0.5 06/18/2013 0445       RADIOGRAPHIC STUDIES: Dg Chest 2 View  07/17/2013   CLINICAL DATA:  Follow-up lung mass  EXAM: CHEST  2 VIEW  COMPARISON:  06/23/2013  FINDINGS: Postop lobectomy on the right with elevation of the right hemidiaphragm and mild scarring in the right lung base.  Negative for heart failure or pneumonia. No pleural effusion. Negative for pneumothorax.  IMPRESSION: Postop changes on the right.  No superimposed acute abnormality.   Electronically Signed   By: Marlan Palau M.D.   On: 07/17/2013 11:31    ASSESSMENT AND PLAN: This is a very pleasant 79 years old [place] American female recently diagnosed with a stage IB (T2 A., N0, M0) non-small cell lung cancer, adenocarcinoma status post right upper lobectomy with lymph node dissection with tumor measuring 3.5 CM. Her tissue blocks were tested with [facility] and it  showed high risk for recurrence. I have a lengthy discussion with the patient today about her current disease status and treatment options. I explained to the patient that I would not recommend adjuvant chemotherapy for a stage IB with tumor size less than 4.0 CM but the [facility] test showed high risk for recurrence. I gave the patient the option of proceeding with adjuvant chemotherapy with 4 cycles of platinum based therapy versus observation and close monitoring. The patient is interested in continuing observation for now.  I would see her back for followup visit in 6 months with repeat CT scan of the chest. She was advised to call immediately if she has any concerning symptoms in the interval. The patient voices understanding of current disease status and treatment options and is in agreement with the current care plan.  All questions were answered. The patient knows to call the clinic with any problems, questions or concerns. We  can certainly see the patient much sooner if necessary.  I spent 15 minutes counseling the patient face to face. The total time spent in the appointment was 25 minutes.

## 2013-07-28 NOTE — Telephone Encounter (Signed)
gv and printed appt sched and avs for pt for June 2015  °

## 2013-07-28 NOTE — Patient Instructions (Signed)
Followup visit in 6 months with repeat CT scan of the chest. 

## 2013-08-07 ENCOUNTER — Ambulatory Visit (INDEPENDENT_AMBULATORY_CARE_PROVIDER_SITE_OTHER): Payer: Medicare Other | Admitting: Podiatrist

## 2013-08-07 ENCOUNTER — Encounter: Payer: Self-pay | Admitting: Podiatrist

## 2013-08-07 VITALS — BP 155/73 | HR 82 | Resp 24 | Ht 60.0 in | Wt 157.0 lb

## 2013-08-07 DIAGNOSIS — M79609 Pain in unspecified limb: Secondary | ICD-10-CM

## 2013-08-07 DIAGNOSIS — B351 Tinea unguium: Secondary | ICD-10-CM

## 2013-08-08 NOTE — Progress Notes (Signed)
HPI:  Patient presents today for follow up of foot and nail care. Liberty was in the hospital for much of the fall for lung cancer and treatment.  Objective:  Patients chart is reviewed.  Neurovascular status unchanged with non palpable pedal pulses left and a below knee amputation right.  Patients nails are thickened, discolored, distrophic, friable and brittle with yellow-brown discoloration. Patient subjectively relates they are painful with shoes and with ambulation left foot x 5  Assessment:  Symptomatic onychomycosis x 5  Plan:  Discussed treatment options and alternatives.  The symptomatic toenails were debrided through manual an mechanical means without complication.  Return appointment recommended at routine intervals of 3 months    Trudie Buckler, DPM

## 2013-08-19 ENCOUNTER — Other Ambulatory Visit: Payer: Self-pay | Admitting: *Deleted

## 2013-08-19 DIAGNOSIS — C3490 Malignant neoplasm of unspecified part of unspecified bronchus or lung: Secondary | ICD-10-CM

## 2013-08-21 ENCOUNTER — Encounter: Payer: Self-pay | Admitting: Cardiothoracic Surgery

## 2013-08-21 ENCOUNTER — Ambulatory Visit
Admission: RE | Admit: 2013-08-21 | Discharge: 2013-08-21 | Disposition: A | Discharge status: Home / Self Care | Payer: Medicare Other | Source: Ambulatory Visit | Attending: Cardiothoracic Surgery | Admitting: Cardiothoracic Surgery

## 2013-08-21 ENCOUNTER — Ambulatory Visit (INDEPENDENT_AMBULATORY_CARE_PROVIDER_SITE_OTHER): Payer: Self-pay | Admitting: Cardiothoracic Surgery

## 2013-08-21 VITALS — BP 169/76 | HR 98 | Resp 18 | Ht 60.0 in | Wt 157.0 lb

## 2013-08-21 DIAGNOSIS — Z902 Acquired absence of lung [part of]: Secondary | ICD-10-CM

## 2013-08-21 DIAGNOSIS — C3410 Malignant neoplasm of upper lobe, unspecified bronchus or lung: Secondary | ICD-10-CM

## 2013-08-21 DIAGNOSIS — C3490 Malignant neoplasm of unspecified part of unspecified bronchus or lung: Secondary | ICD-10-CM

## 2013-08-21 DIAGNOSIS — Z9889 Other specified postprocedural states: Secondary | ICD-10-CM

## 2013-08-21 NOTE — Progress Notes (Signed)
[place] Record #[id number] Date of Birth: [birth year]  Tanda Rockers, MD Reymundo Poll, MD  Chief Complaint:   PostOp Follow Up Visit 06/16/2013  OPERATIVE REPORT  PREOPERATIVE DIAGNOSIS: Right upper lobe lung cancer.  POSTOPERATIVE DIAGNOSIS: Right upper lobe lung cancer.  SURGICAL PROCEDURE: Bronchoscopy, right video-assisted thoracoscopy,  minithoracotomy, right upper lobectomy with lymph node dissection, and  placement of On-Q device.  SURGEON: Lanelle Bal, MD  PATH: Stage IB (pT2a,pN0, cM0) ADENOCARCINOMA WITH FOCAL SARCOMATOUS DIFFERENTIATION Diagnosis 1. Lung, resection (segmental or lobe), Right upper lobe - ADENOCARCINOMA WITH FOCAL SARCOMATOUS DIFFERENTIATION, 3.5 CM. - FOCAL VISCERAL PLEURAL INVOLVEMENT. - BRONCHIAL MARGIN FREE OF TUMOR. - ONE BENIGN HILAR LYMPH NODE. 2. Lymph node, biopsy, 10 R - ANTHRACOTIC LYMPH NODE. NO EVIDENCE OF MALIGNANCY. 3. Lymph node, biopsy, 11 R - ANTHRACOTIC LYMPH NODE. NO EVIDENCE OF MALIGNANCY. 4. Lymph node, biopsy, 10 R #2 - ANTHRACOTIC LYMPH NODE. NO EVIDENCE OF MALIGNANCY. 5. Lymph node, biopsy, 4 R azygos - ANTHRACOTIC LYMPH NODE. NO EVIDENCE OF MALIGNANCY. 6. Lymph node, biopsy, 2 R - ANTHRACOTIC LYMPH NODE. NO EVIDENCE OF MALIGNANCY. Microscopic Comment 1. LUNG Specimen, including laterality: Right upper lung lobe Procedure: Lobectomy Specimen integrity (intact/disrupted): Intact Tumor site: Right upper lobe Tumor focality: Unifocal Maximum tumor size (cm): 3.5 cm Histologic type: Adenocarcinoma with focal sarcomatous differentiation Grade: 2 Margins: Free of tumor Distance to closest margin (cm): 2.5 cm from bronchial margin Visceral pleura invasion: Present, focal Tumor extension: Focal visceral pleural involvement Treatment effect (if treated with neoadjuvant therapy): No Lymph -Vascular invasion: No Lymph nodes: Number examined - 6 ; Number N1 nodes positive 0 ;  Number N2 nodes positive 0 1 of 3 FINAL for [place]mbach, Ever F ([id number]) Microscopic Comment(continued) TNM code: pT2a , pN0 Ancillary studies: Can be performed if requested Non-neoplastic lung: Unremarkable Comments: The tumor is a moderately differentiated adenocarcinoma which is 3.5 cm in greatest dimension and has focal sarcomatous differentiation primarily seen in the recut of the frozen section (slide 1B). (JDP:caf 06/17/13) Claudette Laws MD  History of Present Illness:      Patient returns today for her first postop visit, considering her age of 80 years and above-the-knee amputation he is doing extremely well postoperatively. She is very motivated and is returning to near-normal activities. She  She continues  to increase her level of activity.No cough or hemptysis.   History  Smoking status  . Former Smoker -- 0.50 packs/day for 60 years  . Types: Cigarettes  . Quit date: 07/31/2009  Smokeless tobacco  . Never Used       Allergies  Allergen Reactions  . Aspirin Anaphylaxis  . Morphine And Related Other (See Comments)    confusion    Current Outpatient Prescriptions  Medication Sig Dispense Refill  . albuterol (PROAIR HFA) 108 (90 BASE) MCG/ACT inhaler Inhale 2 puffs into the lungs every 6 (six) hours as needed for wheezing.      . cholecalciferol (VITAMIN D) 1000 UNITS tablet Take 1,000 Units by mouth daily.      . clopidogrel (PLAVIX) 75 MG tablet Take 75 mg by mouth 3 (three) times a week. Monday, Wednesday, friday      . furosemide (LASIX) 20 MG tablet Take 20 mg by mouth 2 (two) times a week. 1 tablet twice a week on Mon and Thurs as needed for edema.      . hydrALAZINE (  APRESOLINE) 25 MG tablet Take 25 mg by mouth 2 (two) times daily.      . meclizine (ANTIVERT) 25 MG tablet Take 25 mg by mouth 3 (three) times daily as needed. For dizziness      . omeprazole (PRILOSEC) 20 MG capsule Take 20 mg by mouth daily before breakfast.       . pravastatin (PRAVACHOL) 20  MG tablet Take 20 mg by mouth at bedtime.       . RESTASIS 0.05 % ophthalmic emulsion Place 1 drop into both eyes every 12 (twelve) hours.       . Sennosides (SENNA LAX PO) Take 1-2 tablets by mouth daily as needed. For constipation      . traMADol (ULTRAM) 50 MG tablet Take 0.5 tablets (25 mg total) by mouth 2 (two) times daily as needed for pain.  30 tablet     No current facility-administered medications for this visit.       Physical Exam: BP 169/76  Pulse 98  Resp 18  Ht 5' (1.524 m)  Wt 157 lb (71.215 kg)  BMI 30.66 kg/m2  SpO2 98%  General appearance: alert, cooperative and appears stated age Neurologic: intact Heart: regular rate and rhythm, S1, S2 normal, no murmur, click, rub or gallop Lungs: clear to auscultation bilaterally Abdomen: soft, non-tender; bowel sounds normal; no masses,  no organomegaly Extremities: No evidence of DVT in her leg, her prosthesis is in place Wound: Port sites an incision right chest are well-healed   Diagnostic Studies & Laboratory data:         Recent Radiology Findings: Dg Chest 2 View  08/21/2013   CLINICAL DATA:  Lung cancer.  EXAM: CHEST  2 VIEW  COMPARISON:  07/17/2013.  FINDINGS: Mediastinum and hilar structures are normal. Pleural parenchymal thickening on the right is noted and is stable. These changes are consistent with scarring. No acute infiltrate noted. No pleural effusion or pneumothorax. Degenerative changes thoracic spine.  IMPRESSION: Stable changes of pleural parenchymal scarring on the right. No acute cardiopulmonary disease .   Electronically Signed   By: Marcello Moores  Register   On: 08/21/2013 15:57      Recent Labs: Lab Results  Component Value Date   WBC 6.1 07/28/2013   HGB 12.2 07/28/2013   HCT 37.1 07/28/2013   PLT 316 07/28/2013   GLUCOSE 103 07/28/2013   ALT 10 07/28/2013   AST 15 07/28/2013   NA 140 07/28/2013   K 4.3 07/28/2013   CL 98 06/23/2013   CREATININE 0.9 07/28/2013   BUN 7.5 07/28/2013   CO2  23 07/28/2013   INR 1.02 06/12/2013      Assessment / Plan:   Patient doing well following resection of what was clinically Stage II carcinoma the lung, but was found pathologically to be stage IB.  Foundation 1 and Myrid  genetic testing has been completed and is scanned into the patient's chart. Seen by  Dr. Mohammed/medical oncology for recommendations on further treatment. He has discussed treatment options with her , she prefers  to continue with observation I plan to see her back in in early July after fu ct  with a followup chest x-ray. I discussed with her the long-term plan of serial CT scans of the chest to follow possible recurrence.   Tambria Pfannenstiel B 08/21/2013 6:12 PM

## 2013-08-22 ENCOUNTER — Other Ambulatory Visit: Payer: Self-pay | Admitting: *Deleted

## 2013-08-22 DIAGNOSIS — G8918 Other acute postprocedural pain: Secondary | ICD-10-CM

## 2013-08-22 MED ORDER — TRAMADOL HCL 50 MG PO TABS: 50.0000 mg | ORAL_TABLET | Freq: Two times a day (BID) | ORAL | Status: DC | PRN

## 2013-09-18 ENCOUNTER — Encounter (HOSPITAL_COMMUNITY): Payer: Medicare Other

## 2013-09-18 ENCOUNTER — Ambulatory Visit: Payer: Medicare Other | Admitting: Family

## 2013-09-18 ENCOUNTER — Inpatient Hospital Stay (HOSPITAL_COMMUNITY): Admission: RE | Admit: 2013-09-18 | Payer: Medicare Other | Source: Ambulatory Visit

## 2013-09-26 ENCOUNTER — Other Ambulatory Visit (HOSPITAL_COMMUNITY): Payer: Medicare Other

## 2013-09-26 ENCOUNTER — Encounter (HOSPITAL_COMMUNITY): Payer: Medicare Other

## 2013-10-01 ENCOUNTER — Ambulatory Visit (INDEPENDENT_AMBULATORY_CARE_PROVIDER_SITE_OTHER)
Admission: RE | Admit: 2013-10-01 | Discharge: 2013-10-01 | Disposition: A | Discharge status: Home / Self Care | Payer: Medicare Other | Source: Ambulatory Visit | Attending: Family | Admitting: Family

## 2013-10-01 ENCOUNTER — Other Ambulatory Visit: Payer: Self-pay | Admitting: Family

## 2013-10-01 ENCOUNTER — Ambulatory Visit (HOSPITAL_COMMUNITY)
Admission: RE | Admit: 2013-10-01 | Discharge: 2013-10-01 | Disposition: A | Discharge status: Home / Self Care | Payer: Medicare Other | Source: Ambulatory Visit | Attending: Vascular Surgery | Admitting: Vascular Surgery

## 2013-10-01 DIAGNOSIS — Z9889 Other specified postprocedural states: Secondary | ICD-10-CM | POA: Insufficient documentation

## 2013-10-01 DIAGNOSIS — Z09 Encounter for follow-up examination after completed treatment for conditions other than malignant neoplasm: Secondary | ICD-10-CM | POA: Insufficient documentation

## 2013-10-01 DIAGNOSIS — I771 Stricture of artery: Secondary | ICD-10-CM

## 2013-10-01 DIAGNOSIS — M25562 Pain in left knee: Secondary | ICD-10-CM

## 2013-10-01 DIAGNOSIS — I739 Peripheral vascular disease, unspecified: Secondary | ICD-10-CM

## 2013-10-01 DIAGNOSIS — Z48812 Encounter for surgical aftercare following surgery on the circulatory system: Secondary | ICD-10-CM

## 2013-10-01 DIAGNOSIS — M25569 Pain in unspecified knee: Secondary | ICD-10-CM

## 2013-10-27 ENCOUNTER — Encounter: Payer: Self-pay | Admitting: Family

## 2013-10-27 ENCOUNTER — Other Ambulatory Visit: Payer: Self-pay

## 2013-10-27 ENCOUNTER — Encounter: Payer: Self-pay | Admitting: Vascular Surgery

## 2013-10-27 DIAGNOSIS — Z48812 Encounter for surgical aftercare following surgery on the circulatory system: Secondary | ICD-10-CM

## 2013-10-27 DIAGNOSIS — I723 Aneurysm of iliac artery: Secondary | ICD-10-CM

## 2013-10-27 NOTE — Patient Instructions (Signed)
Dear Ms. Ahr,  Your recent Lab test  Has a slight increase of blockage in Left common iliac artery stent.  Please return in 6 months for a follow up.     Peripheral Vascular Disease  Peripheral vascular disease (PVD) is caused by cholesterol buildup in the arteries. The arteries become narrow or clogged. This makes it hard for blood to flow. It happens most in the legs, but it can occur in other areas of your body. HOME CARE   Quit smoking, if you smoke.  Exercise as told by your doctor.  Follow a low-fat, low-cholesterol diet as told by your doctor.  Control your diabetes, if you have diabetes.  Care for your feet to prevent infection.  Only take medicine as told by your doctor. GET HELP RIGHT AWAY IF:   You have pain or lose feeling (numbness) in your arms or legs.  Your arms or legs turn cold or blue.  You have redness, warmth, and puffiness (swelling) in your arms or legs. MAKE SURE YOU:   Understand these instructions.  Will watch your condition.  Will get help right away if you are not doing well or get worse. Document Released: 10/11/2009 Document Revised: 10/09/2011 Document Reviewed: 10/11/2009 [facility] Patient Information 2014 [place], [place].

## 2013-10-27 NOTE — Patient Instructions (Signed)
Dear Ms. Loppnow,  There is a slight increase of blockage in the Left Common  Iliac artery stent.  Please return in 6 months for a follow up of ABI's  And iliac stent.     Peripheral Vascular Disease  Peripheral vascular disease (PVD) is caused by cholesterol buildup in the arteries. The arteries become narrow or clogged. This makes it hard for blood to flow. It happens most in the legs, but it can occur in other areas of your body. HOME CARE   Quit smoking, if you smoke.  Exercise as told by your doctor.  Follow a low-fat, low-cholesterol diet as told by your doctor.  Control your diabetes, if you have diabetes.  Care for your feet to prevent infection.  Only take medicine as told by your doctor. GET HELP RIGHT AWAY IF:   You have pain or lose feeling (numbness) in your arms or legs.  Your arms or legs turn cold or blue.  You have redness, warmth, and puffiness (swelling) in your arms or legs. MAKE SURE YOU:   Understand these instructions.  Will watch your condition.  Will get help right away if you are not doing well or get worse. Document Released: 10/11/2009 Document Revised: 10/09/2011 Document Reviewed: 10/11/2009 [facility] Patient Information 2014 [place], [place].

## 2013-11-06 ENCOUNTER — Ambulatory Visit (INDEPENDENT_AMBULATORY_CARE_PROVIDER_SITE_OTHER): Payer: Medicare Other | Admitting: Podiatrist

## 2013-11-06 DIAGNOSIS — M79609 Pain in unspecified limb: Secondary | ICD-10-CM

## 2013-11-06 DIAGNOSIS — B351 Tinea unguium: Secondary | ICD-10-CM

## 2013-11-06 NOTE — Progress Notes (Signed)
HPI: Patient presents today for follow up of foot and nail care. [name] was in the hospital for much of the fall for lung cancer and treatment-- she has had no hospitalizations of recent.    Objective: Patients chart is reviewed. Neurovascular status unchanged with non palpable pedal pulses left and a below knee amputation right. Patients nails are thickened, discolored, distrophic, friable and brittle with yellow-brown discoloration. Patient subjectively relates they are painful with shoes and with ambulation left foot x 5   Assessment: Symptomatic onychomycosis x 5 / pvd/ previous amputation right  Plan: Discussed treatment options and alternatives. The symptomatic toenails were carefully debrided through manual an mechanical means without complication. Return appointment recommended at routine intervals of 3 months  Trudie Buckler, DPM

## 2014-01-06 ENCOUNTER — Telehealth: Payer: Self-pay

## 2014-01-06 NOTE — Telephone Encounter (Signed)
Pt. Called to report a "quarter-sized spot" left lower leg that appears black and blue.  Describes "a round center with prongs coming out from it."  Denies that area is raised.  Stated she had one on her (L) leg, a week ago, the size of a 50 cent piece, that looked just the same, but has gone away.  Stated "I get bruises on my arms, but this doesn't look the same."  Denies any redness in surrounding tissue. Denies any open sore.  Denies any swelling of feet/ (L) lower extremity. Reported she slipped from the side of her bed yesterday, and landed on her elbows and buttocks.  Stated she is a little sore today, but didn't get injured.  Reports her left leg feels cool at times.  Reports she gets cramps in left calf, intermittently, at night. Advised to continue to monitor the black/ blue spots on her left leg, and report if worsens or condition persists, or any other concerns.  Pt. admits to continuing her Plavix.  Verb. Understanding.

## 2014-01-12 ENCOUNTER — Telehealth: Payer: Self-pay | Admitting: *Deleted

## 2014-01-12 NOTE — Telephone Encounter (Signed)
Patient's aid had called about [name] having right stump pain. She has had no falls or other injuries to her stump. Patient states that she has no wounds or abrasions but also says that her prosthesis is not fitting properly now; it is too big and she is having to stuff clothes around the stump to use it. She is ambulating with this prosthesis everyday and it has been a year since she has seen the prosthetist ( [facility]). I told her to have her aid, [name], call and get her an appt to see [facility] and perhaps have this revised. The patient feels like this is the problem causing her pain. She is afebrile and has no erythema, wounds or drainage from her stump. She is scheduled to see [place] in September for repeat vascular studies and to see Clemon Chambers, NP.  [name] voiced understanding and agreement with this plan.

## 2014-01-26 ENCOUNTER — Ambulatory Visit (HOSPITAL_COMMUNITY)
Admission: RE | Admit: 2014-01-26 | Discharge: 2014-01-26 | Disposition: A | Discharge status: Home / Self Care | Payer: Medicare Other | Source: Ambulatory Visit | Attending: Internal Medicine | Admitting: Internal Medicine

## 2014-01-26 ENCOUNTER — Other Ambulatory Visit (HOSPITAL_BASED_OUTPATIENT_CLINIC_OR_DEPARTMENT_OTHER): Payer: Medicare Other

## 2014-01-26 ENCOUNTER — Encounter (HOSPITAL_COMMUNITY): Payer: Self-pay

## 2014-01-26 DIAGNOSIS — C3490 Malignant neoplasm of unspecified part of unspecified bronchus or lung: Secondary | ICD-10-CM | POA: Insufficient documentation

## 2014-01-26 DIAGNOSIS — I517 Cardiomegaly: Secondary | ICD-10-CM | POA: Insufficient documentation

## 2014-01-26 DIAGNOSIS — Z902 Acquired absence of lung [part of]: Secondary | ICD-10-CM | POA: Diagnosis not present

## 2014-01-26 DIAGNOSIS — I2510 Atherosclerotic heart disease of native coronary artery without angina pectoris: Secondary | ICD-10-CM | POA: Diagnosis not present

## 2014-01-26 DIAGNOSIS — I700 Atherosclerosis of aorta: Secondary | ICD-10-CM | POA: Diagnosis not present

## 2014-01-26 DIAGNOSIS — C3410 Malignant neoplasm of upper lobe, unspecified bronchus or lung: Secondary | ICD-10-CM

## 2014-01-26 DIAGNOSIS — J438 Other emphysema: Secondary | ICD-10-CM | POA: Diagnosis not present

## 2014-01-26 LAB — COMPREHENSIVE METABOLIC PANEL (CC13)
ALT: 12 U/L (ref 0–55)
AST: 17 U/L (ref 5–34)
Albumin: 3.7 g/dL (ref 3.5–5.0)
Alkaline Phosphatase: 58 U/L (ref 40–150)
Anion Gap: 11 mEq/L (ref 3–11)
BUN: 13 mg/dL (ref 7.0–26.0)
CO2: 24 mEq/L (ref 22–29)
Calcium: 9.7 mg/dL (ref 8.4–10.4)
Chloride: 106 mEq/L (ref 98–109)
Creatinine: 1.1 mg/dL (ref 0.6–1.1)
Glucose: 122 mg/dl (ref 70–140)
Potassium: 4.8 mEq/L (ref 3.5–5.1)
Sodium: 141 mEq/L (ref 136–145)
Total Bilirubin: 0.35 mg/dL (ref 0.20–1.20)
Total Protein: 7.1 g/dL (ref 6.4–8.3)

## 2014-01-26 LAB — CBC WITH DIFFERENTIAL/PLATELET
BASO%: 0.7 % (ref 0.0–2.0)
Basophils Absolute: 0 10*3/uL (ref 0.0–0.1)
EOS%: 1.3 % (ref 0.0–7.0)
Eosinophils Absolute: 0.1 10*3/uL (ref 0.0–0.5)
HCT: 41.6 % (ref 34.8–46.6)
HGB: 13.2 g/dL (ref 11.6–15.9)
LYMPH%: 48.3 % (ref 14.0–49.7)
MCH: 27.6 pg (ref 25.1–34.0)
MCHC: 31.7 g/dL (ref 31.5–36.0)
MCV: 86.8 fL (ref 79.5–101.0)
MONO#: 0.4 10*3/uL (ref 0.1–0.9)
MONO%: 8.3 % (ref 0.0–14.0)
NEUT#: 1.9 10*3/uL (ref 1.5–6.5)
NEUT%: 41.4 % (ref 38.4–76.8)
Platelets: 302 10*3/uL (ref 145–400)
RBC: 4.79 10*6/uL (ref 3.70–5.45)
RDW: 14.3 % (ref 11.2–14.5)
WBC: 4.6 10*3/uL (ref 3.9–10.3)
lymph#: 2.2 10*3/uL (ref 0.9–3.3)

## 2014-01-26 MED ORDER — IOHEXOL 300 MG/ML  SOLN
80.0000 mL | Freq: Once | INTRAMUSCULAR | Status: AC | PRN
  Administered 2014-01-26: 80 mL via INTRAVENOUS

## 2014-01-27 ENCOUNTER — Telehealth: Payer: Self-pay | Admitting: Internal Medicine

## 2014-01-27 ENCOUNTER — Ambulatory Visit (HOSPITAL_BASED_OUTPATIENT_CLINIC_OR_DEPARTMENT_OTHER): Payer: Medicare Other | Admitting: Internal Medicine

## 2014-01-27 VITALS — BP 159/46 | HR 78 | Temp 98.0°F | Resp 18 | Ht 60.0 in | Wt 161.3 lb

## 2014-01-27 DIAGNOSIS — C3411 Malignant neoplasm of upper lobe, right bronchus or lung: Secondary | ICD-10-CM

## 2014-01-27 DIAGNOSIS — Z85118 Personal history of other malignant neoplasm of bronchus and lung: Secondary | ICD-10-CM

## 2014-01-27 NOTE — Progress Notes (Signed)
[place] [phone]   [phone]  OFFICE PROGRESS NOTE  Reymundo Poll, MD [phone] [place] [address]  DIAGNOSIS: Stage IB (T2a, N0, M0) non-small cell lung cancer consistent with moderately differentiated adenocarcinoma diagnosed in September of 2014.   PRIOR THERAPY: Bronchoscopy, right video-assisted thoracoscopy, minithoracotomy, right upper lobectomy with lymph node dissection under the care of Dr. Servando Snare on 06/16/2013. Tumor size was 3.5 CM and the myriad genetics showed high risk for recurrence.  CURRENT THERAPY: Observation.  INTERVAL HISTORY: [name] 80 y.o. female returns to the clinic today for followup visit accompanied by her friend. The patient is feeling fine today with no specific complaints except for mild fatigue and shortness of breath with exertion. She denied having any significant chest pain, cough or hemoptysis. She has no nausea or vomiting. She is here today for evaluation and discussion of her treatment options. She denied having any significant weight loss or night sweats. She denied having any fever or chills. She has repeat CT scan of the chest performed recently and she is here for evaluation and discussion of her scan results.  MEDICAL HISTORY: Past Medical History  Diagnosis Date  . Stroke   . Arthritis   . Hypertension   . Hyperlipidemia   . Glaucoma   . Peripheral vascular disease, unspecified     with Claudication  . COPD GOLD I 04/18/2013  . Shortness of breath   . Cancer     rt lung  . Lung cancer 06/07/2013    ALLERGIES:  is allergic to aspirin and morphine and related.  MEDICATIONS:  Current Outpatient Prescriptions  Medication Sig Dispense Refill  . albuterol (PROAIR HFA) 108 (90 BASE) MCG/ACT inhaler Inhale 2 puffs into the lungs every 6 (six) hours as needed for wheezing.      . cholecalciferol (VITAMIN D) 1000 UNITS tablet Take 1,000 Units by mouth daily.      .  clopidogrel (PLAVIX) 75 MG tablet Take 75 mg by mouth 3 (three) times a week. Monday, Wednesday, friday      . furosemide (LASIX) 20 MG tablet Take 20 mg by mouth 2 (two) times a week. 1 tablet twice a week on Mon and Thurs as needed for edema.      . hydrALAZINE (APRESOLINE) 25 MG tablet Take 25 mg by mouth 2 (two) times daily.      . meclizine (ANTIVERT) 25 MG tablet Take 25 mg by mouth 3 (three) times daily as needed. For dizziness      . omeprazole (PRILOSEC) 20 MG capsule Take 20 mg by mouth daily before breakfast.       . pravastatin (PRAVACHOL) 20 MG tablet Take 20 mg by mouth at bedtime.       . RESTASIS 0.05 % ophthalmic emulsion Place 1 drop into both eyes every 12 (twelve) hours.       . Sennosides (SENNA LAX PO) Take 1-2 tablets by mouth daily as needed. For constipation      . traMADol (ULTRAM) 50 MG tablet Take 1 tablet (50 mg total) by mouth 2 (two) times daily as needed.  40 tablet  0   No current facility-administered medications for this visit.    SURGICAL HISTORY:  Past Surgical History  Procedure Laterality Date  . Above knee leg amputation      Right  . Angioplasty / stenting iliac  03/22/09    Aortogram-  left common iliac artery  .  Hemorrhoid surgery    . Tonsillectomy    . Colonoscopy  Feb. 2013  . Video bronchoscopy Bilateral 04/10/2013    Procedure: VIDEO BRONCHOSCOPY WITH FLUORO;  Surgeon: Tanda Rockers, MD;  Location: [facility];  Service: Cardiopulmonary;  Laterality: Bilateral;  . Video bronchoscopy N/A 06/16/2013    Procedure: VIDEO BRONCHOSCOPY;  Surgeon: [name], MD;  Location: [facility];  Service: Thoracic;  Laterality: N/A;  . Video assisted thoracoscopy (vats)/wedge resection Right 06/16/2013    Procedure: VIDEO ASSISTED THORACOSCOPY (VATS)/WEDGE RESECTION;  Surgeon: [name], MD;  Location: [facility];  Service: Thoracic;  Laterality: Right;    REVIEW OF SYSTEMS:  Constitutional: positive for fatigue Eyes: negative Ears, nose, mouth,  throat, and face: negative Respiratory: positive for dyspnea on exertion Cardiovascular: negative Gastrointestinal: negative Genitourinary:negative Integument/breast: negative Hematologic/lymphatic: negative Musculoskeletal:positive for muscle weakness Neurological: negative Behavioral/Psych: negative Endocrine: negative Allergic/Immunologic: negative   PHYSICAL EXAMINATION: General appearance: alert, cooperative, fatigued and no distress Head: Normocephalic, without obvious abnormality, atraumatic Neck: no adenopathy, no JVD, supple, symmetrical, trachea midline and thyroid not enlarged, symmetric, no tenderness/mass/nodules Lymph nodes: Cervical, supraclavicular, and axillary nodes normal. Resp: clear to auscultation bilaterally Back: symmetric, no curvature. ROM normal. No CVA tenderness. Cardio: regular rate and rhythm, S1, S2 normal, no murmur, click, rub or gallop GI: soft, non-tender; bowel sounds normal; no masses,  no organomegaly Extremities: extremities normal, atraumatic, no cyanosis or edema Neurologic: Alert and oriented X 3, normal strength and tone. Normal symmetric reflexes. Normal coordination and gait  ECOG PERFORMANCE STATUS: 2 - Symptomatic, <50% confined to bed  There were no vitals taken for this visit.  LABORATORY DATA: Lab Results  Component Value Date   WBC 4.6 01/26/2014   HGB 13.2 01/26/2014   HCT 41.6 01/26/2014   MCV 86.8 01/26/2014   PLT 302 01/26/2014      Chemistry      Component Value Date/Time   NA 141 01/26/2014 0812   NA 132* 06/23/2013 0415   K 4.8 01/26/2014 0812   K 4.0 06/23/2013 0415   CL 98 06/23/2013 0415   CO2 24 01/26/2014 0812   CO2 24 06/23/2013 0415   BUN 13.0 01/26/2014 0812   BUN 12 06/23/2013 0415   CREATININE 1.1 01/26/2014 0812   CREATININE 0.91 06/23/2013 0415      Component Value Date/Time   CALCIUM 9.7 01/26/2014 0812   CALCIUM 8.4 06/23/2013 0415   ALKPHOS 58 01/26/2014 0812   ALKPHOS 43 06/18/2013 0445   AST 17  01/26/2014 0812   AST 21 06/18/2013 0445   ALT 12 01/26/2014 0812   ALT 11 06/18/2013 0445   BILITOT 0.35 01/26/2014 0812   BILITOT 0.5 06/18/2013 0445       RADIOGRAPHIC STUDIES: Ct Chest W Contrast  01/26/2014   CLINICAL DATA:  History of lung cancer diagnosed in November 2014. Status post right upper lobectomy.  EXAM: CT CHEST WITH CONTRAST  TECHNIQUE: Multidetector CT imaging of the chest was performed during intravenous contrast administration.  CONTRAST:  58m OMNIPAQUE IOHEXOL 300 MG/ML  SOLN  COMPARISON:  PET-CT 04/30/2013.  Chest CT 03/11/2013.  FINDINGS: Mediastinum: Heart size is enlarged with right atrial and right ventricular dilatation. There is no significant pericardial fluid, thickening or pericardial calcification. There is atherosclerosis of the thoracic aorta, the great vessels of the mediastinum and the coronary arteries, including calcified atherosclerotic plaque in the left main, left anterior descending, left circumflex and right coronary arteries. No pathologically enlarged mediastinal or hilar lymph  nodes. Esophagus is unremarkable in appearance.  Lungs/Pleura: Status post right upper lobectomy. Mild architectural distortion in the right lung indicative of mild postoperative scarring. Compensatory hyperexpansion of the right middle and lower lobes. 3 mm nodule in the medial aspect of the left lower lobe (image 37 of series 5). 4 mm nodule in the left lower lobe (image 31 of series 5), unchanged. No other suspicious appearing pulmonary nodules or masses. Mild diffuse bronchial wall thickening with mild centrilobular emphysema. No acute consolidative airspace disease. No pleural effusions.  Upper Abdomen: Calcified granuloma in the spleen.  Musculoskeletal: 9 mm soft tissue attenuation nodule in the lateral aspect of the right breast (image 26 of series 2), similar to prior examinations, including PET-CT 04/30/2013, at which point this was not hypermetabolic. There are no aggressive  appearing lytic or blastic lesions noted in the visualized portions of the skeleton.  IMPRESSION: 1. Status post right upper lobectomy. No findings to suggest local recurrence of disease or metastatic disease in the thorax on today's examination. 2. Tiny nonspecific pulmonary nodules in the left lower lobe, as above. Attention on followup studies is recommended. 3. Cardiomegaly with right atrial and right ventricular dilatation. 4. Atherosclerosis, including left main and 3 vessel coronary artery disease. 5. Additional incidental findings, as above.   Electronically Signed   By: Vinnie Langton M.D.   On: 01/26/2014 10:34   ASSESSMENT AND PLAN: This is a very pleasant 80 years old [place] American female recently diagnosed with a stage IB (T2a, N0, M0) non-small cell lung cancer, adenocarcinoma status post right upper lobectomy with lymph node dissection with tumor measuring 3.5 CM. Her tissue blocks were tested with [facility] genetics and it showed high risk for recurrence. Her recent CT scan of the chest showed no evidence for disease recurrence. I discussed the scan results with the patient. I would see her back for followup visit in 6 months with repeat CT scan of the chest. She was advised to call immediately if she has any concerning symptoms in the interval. The patient voices understanding of current disease status and treatment options and is in agreement with the current care plan.  All questions were answered. The patient knows to call the clinic with any problems, questions or concerns. We can certainly see the patient much sooner if necessary.  Disclaimer: This note was dictated with voice recognition software. Similar sounding words can inadvertently be transcribed and may not be corrected upon review.

## 2014-01-27 NOTE — Telephone Encounter (Signed)
Gave pt appt for lab and MDF for december 2015

## 2014-01-29 ENCOUNTER — Ambulatory Visit (INDEPENDENT_AMBULATORY_CARE_PROVIDER_SITE_OTHER): Payer: Medicare Other | Admitting: Podiatrist

## 2014-01-29 DIAGNOSIS — M79609 Pain in unspecified limb: Secondary | ICD-10-CM

## 2014-01-29 DIAGNOSIS — B351 Tinea unguium: Secondary | ICD-10-CM

## 2014-01-29 DIAGNOSIS — M79673 Pain in unspecified foot: Secondary | ICD-10-CM

## 2014-01-29 NOTE — Progress Notes (Signed)
HPI: Patient presents today for follow up of foot and nail care. [name] was in the hospital for much of the fall for lung cancer and treatment-- she has had no hospitalizations of recent.  Objective: Patients chart is reviewed. Neurovascular status unchanged with non palpable pedal pulses left and a below knee amputation right. Patients nails are thickened, discolored, distrophic, friable and brittle with yellow-brown discoloration. Patient subjectively relates they are painful with shoes and with ambulation left foot x 5  Assessment: Symptomatic onychomycosis x 5 / pvd/ previous amputation right  Plan: Discussed treatment options and alternatives. The symptomatic toenails were carefully debrided through manual an mechanical means without complication. Return appointment recommended at routine intervals of 3 months  Trudie Buckler, DPM   Recent cancer scan for lung ca is negative

## 2014-02-04 ENCOUNTER — Other Ambulatory Visit: Payer: Self-pay

## 2014-02-04 DIAGNOSIS — Z1231 Encounter for screening mammogram for malignant neoplasm of breast: Secondary | ICD-10-CM

## 2014-02-05 ENCOUNTER — Ambulatory Visit: Payer: Medicare Other | Admitting: Podiatrist

## 2014-02-12 ENCOUNTER — Encounter: Payer: Self-pay | Admitting: Cardiothoracic Surgery

## 2014-02-12 ENCOUNTER — Ambulatory Visit: Payer: Medicare Other | Admitting: Cardiothoracic Surgery

## 2014-02-12 ENCOUNTER — Ambulatory Visit (INDEPENDENT_AMBULATORY_CARE_PROVIDER_SITE_OTHER): Payer: Medicare Other | Admitting: Cardiothoracic Surgery

## 2014-02-12 VITALS — BP 176/65 | HR 72 | Resp 18 | Ht 60.0 in | Wt 160.0 lb

## 2014-02-12 DIAGNOSIS — C3410 Malignant neoplasm of upper lobe, unspecified bronchus or lung: Secondary | ICD-10-CM

## 2014-02-12 DIAGNOSIS — Z9889 Other specified postprocedural states: Secondary | ICD-10-CM | POA: Diagnosis not present

## 2014-02-12 DIAGNOSIS — Z902 Acquired absence of lung [part of]: Secondary | ICD-10-CM

## 2014-02-12 NOTE — Progress Notes (Signed)
[facility] Record #[id number] Date of Birth: 1927-08-24  Nicole Rockers, MD Reymundo Poll, MD  Chief Complaint:   PostOp Follow Up Visit 06/16/2013  OPERATIVE REPORT  PREOPERATIVE DIAGNOSIS: Right upper lobe lung cancer.  POSTOPERATIVE DIAGNOSIS: Right upper lobe lung cancer.  SURGICAL PROCEDURE: Bronchoscopy, right video-assisted thoracoscopy,  minithoracotomy, right upper lobectomy with lymph node dissection, and  placement of On-Q device.  SURGEON: Lanelle Bal, MD  PATH: Stage IB (pT2a,pN0, cM0) ADENOCARCINOMA WITH FOCAL SARCOMATOUS DIFFERENTIATION Diagnosis 1. Lung, resection (segmental or lobe), Right upper lobe - ADENOCARCINOMA WITH FOCAL SARCOMATOUS DIFFERENTIATION, 3.5 CM. - FOCAL VISCERAL PLEURAL INVOLVEMENT. - BRONCHIAL MARGIN FREE OF TUMOR. - ONE BENIGN HILAR LYMPH NODE. 2. Lymph node, biopsy, 10 R - ANTHRACOTIC LYMPH NODE. NO EVIDENCE OF MALIGNANCY. 3. Lymph node, biopsy, 11 R - ANTHRACOTIC LYMPH NODE. NO EVIDENCE OF MALIGNANCY. 4. Lymph node, biopsy, 10 R #2 - ANTHRACOTIC LYMPH NODE. NO EVIDENCE OF MALIGNANCY. 5. Lymph node, biopsy, 4 R azygos - ANTHRACOTIC LYMPH NODE. NO EVIDENCE OF MALIGNANCY. 6. Lymph node, biopsy, 2 R - ANTHRACOTIC LYMPH NODE. NO EVIDENCE OF MALIGNANCY. Microscopic Comment 1. LUNG Specimen, including laterality: Right upper lung lobe Procedure: Lobectomy Specimen integrity (intact/disrupted): Intact Tumor site: Right upper lobe Tumor focality: Unifocal Maximum tumor size (cm): 3.5 cm Histologic type: Adenocarcinoma with focal sarcomatous differentiation Grade: 2 Margins: Free of tumor Distance to closest margin (cm): 2.5 cm from bronchial margin Visceral pleura invasion: Present, focal Tumor extension: Focal visceral pleural involvement Treatment effect (if treated with neoadjuvant therapy): No Lymph -Vascular invasion: No Lymph nodes: Number examined - 6 ; Number N1 nodes positive 0 ;  Number N2 nodes positive 0 1 of 3 FINAL for Dotts, Ariannah F ([id number]) Microscopic Comment(continued) TNM code: pT2a , pN0 Ancillary studies: Can be performed if requested Non-neoplastic lung: Unremarkable Comments: The tumor is a moderately differentiated adenocarcinoma which is 3.5 cm in greatest dimension and has focal sarcomatous differentiation primarily seen in the recut of the frozen section (slide 1B). (JDP:caf 06/17/13) Claudette Laws MD  History of Present Illness:      Patient returns today for her first postop visit, considering her age of 53 years and above-the-knee amputation he is doing extremely well postoperatively. She is very motivated and is returning to near-normal activities. She denies been limited by shortness of breath has had no hemoptysis, recent CT scan of the chest was ordered by Dr. Inda Merlin.   History  Smoking status  . Former Smoker -- 0.50 packs/day for 60 years  . Types: Cigarettes  . Quit date: 07/31/2009  Smokeless tobacco  . Never Used       Allergies  Allergen Reactions  . Aspirin Anaphylaxis  . Morphine And Related Other (See Comments)    confusion    Current Outpatient Prescriptions  Medication Sig Dispense Refill  . albuterol (PROAIR HFA) 108 (90 BASE) MCG/ACT inhaler Inhale 2 puffs into the lungs every 6 (six) hours as needed for wheezing.      . cholecalciferol (VITAMIN D) 1000 UNITS tablet Take 1,000 Units by mouth daily.      . clopidogrel (PLAVIX) 75 MG tablet Take 75 mg by mouth 3 (three) times a week. Monday, Wednesday, friday      . furosemide (LASIX) 20 MG tablet Take 20 mg by mouth 2 (two) times a week. 1 tablet twice a week on Mon and Thurs as needed  for edema.      . hydrALAZINE (APRESOLINE) 25 MG tablet Take 25 mg by mouth 2 (two) times daily.      . meclizine (ANTIVERT) 25 MG tablet Take 25 mg by mouth 3 (three) times daily as needed. For dizziness      . omeprazole (PRILOSEC) 20 MG capsule Take 20 mg by mouth daily  before breakfast.       . pravastatin (PRAVACHOL) 20 MG tablet Take 20 mg by mouth at bedtime.       . RESTASIS 0.05 % ophthalmic emulsion Place 1 drop into both eyes every 12 (twelve) hours.       . Sennosides (SENNA LAX PO) Take 1-2 tablets by mouth daily as needed. For constipation      . traMADol (ULTRAM) 50 MG tablet Take 1 tablet (50 mg total) by mouth 2 (two) times daily as needed.  40 tablet  0   No current facility-administered medications for this visit.       Physical Exam: BP 176/65  Pulse 72  Resp 18  Ht 5' (1.524 m)  Wt 160 lb (72.576 kg)  BMI 31.25 kg/m2  SpO2 97%  General appearance: alert, cooperative and appears stated age Neurologic: intact Heart: regular rate and rhythm, S1, S2 normal, no murmur, click, rub or gallop Lungs: clear to auscultation bilaterally Abdomen: soft, non-tender; bowel sounds normal; no masses,  no organomegaly Extremities: No evidence of DVT in her leg, her prosthesis is in place Wound: Port sites an incision right chest are well-healed I do not appreciate cervical or supraclavicular adenopathy  Diagnostic Studies & Laboratory data:         Recent Radiology Findings: Ct Chest W Contrast  01/26/2014   CLINICAL DATA:  History of lung cancer diagnosed in November 2014. Status post right upper lobectomy.  EXAM: CT CHEST WITH CONTRAST  TECHNIQUE: Multidetector CT imaging of the chest was performed during intravenous contrast administration.  CONTRAST:  30mL OMNIPAQUE IOHEXOL 300 MG/ML  SOLN  COMPARISON:  PET-CT 04/30/2013.  Chest CT 03/11/2013.  FINDINGS: Mediastinum: Heart size is enlarged with right atrial and right ventricular dilatation. There is no significant pericardial fluid, thickening or pericardial calcification. There is atherosclerosis of the thoracic aorta, the great vessels of the mediastinum and the coronary arteries, including calcified atherosclerotic plaque in the left main, left anterior descending, left circumflex and right  coronary arteries. No pathologically enlarged mediastinal or hilar lymph nodes. Esophagus is unremarkable in appearance.  Lungs/Pleura: Status post right upper lobectomy. Mild architectural distortion in the right lung indicative of mild postoperative scarring. Compensatory hyperexpansion of the right middle and lower lobes. 3 mm nodule in the medial aspect of the left lower lobe (image 37 of series 5). 4 mm nodule in the left lower lobe (image 31 of series 5), unchanged. No other suspicious appearing pulmonary nodules or masses. Mild diffuse bronchial wall thickening with mild centrilobular emphysema. No acute consolidative airspace disease. No pleural effusions.  Upper Abdomen: Calcified granuloma in the spleen.  Musculoskeletal: 9 mm soft tissue attenuation nodule in the lateral aspect of the right breast (image 26 of series 2), similar to prior examinations, including PET-CT 04/30/2013, at which point this was not hypermetabolic. There are no aggressive appearing lytic or blastic lesions noted in the visualized portions of the skeleton.  IMPRESSION: 1. Status post right upper lobectomy. No findings to suggest local recurrence of disease or metastatic disease in the thorax on today's examination. 2. Tiny nonspecific pulmonary nodules in the  left lower lobe, as above. Attention on followup studies is recommended. 3. Cardiomegaly with right atrial and right ventricular dilatation. 4. Atherosclerosis, including left main and 3 vessel coronary artery disease. 5. Additional incidental findings, as above.   Electronically Signed   By: Vinnie Langton M.D.   On: 01/26/2014 10:34     Recent Labs: Lab Results  Component Value Date   WBC 4.6 01/26/2014   HGB 13.2 01/26/2014   HCT 41.6 01/26/2014   PLT 302 01/26/2014   GLUCOSE 122 01/26/2014   ALT 12 01/26/2014   AST 17 01/26/2014   NA 141 01/26/2014   K 4.8 01/26/2014   CL 98 06/23/2013   CREATININE 1.1 01/26/2014   BUN 13.0 01/26/2014   CO2 24 01/26/2014   INR  1.02 06/12/2013      Assessment / Plan:   Patient doing well following resection of what was clinically Stage II carcinoma the lung, but was found pathologically to be stage IB.  Foundation 1 and Myrid  genetic testing has been completed and is scanned into the patient's chart. Seen by  Dr. Mohammed/medical oncology for recommendations on further treatment. He has discussed treatment options with her , she prefers  to continue with observation Plan see the patient back in 6 months.  Tashonda Pinkus B 02/12/2014 11:30 AM

## 2014-02-26 ENCOUNTER — Ambulatory Visit: Payer: Medicare Other

## 2014-02-27 ENCOUNTER — Encounter (INDEPENDENT_AMBULATORY_CARE_PROVIDER_SITE_OTHER): Payer: Self-pay

## 2014-02-27 ENCOUNTER — Ambulatory Visit
Admission: RE | Admit: 2014-02-27 | Discharge: 2014-02-27 | Disposition: A | Discharge status: Home / Self Care | Payer: Medicare Other | Source: Ambulatory Visit

## 2014-02-27 DIAGNOSIS — Z1231 Encounter for screening mammogram for malignant neoplasm of breast: Secondary | ICD-10-CM

## 2014-04-09 ENCOUNTER — Other Ambulatory Visit: Payer: Self-pay | Admitting: *Deleted

## 2014-04-09 DIAGNOSIS — I739 Peripheral vascular disease, unspecified: Secondary | ICD-10-CM

## 2014-04-09 DIAGNOSIS — Z48812 Encounter for surgical aftercare following surgery on the circulatory system: Secondary | ICD-10-CM

## 2014-04-28 ENCOUNTER — Encounter: Payer: Self-pay | Admitting: Vascular Surgery

## 2014-04-29 ENCOUNTER — Encounter: Payer: Self-pay | Admitting: Family

## 2014-04-29 ENCOUNTER — Ambulatory Visit (HOSPITAL_COMMUNITY)
Admission: RE | Admit: 2014-04-29 | Discharge: 2014-04-29 | Disposition: A | Discharge status: Home / Self Care | Payer: Medicare Other | Source: Ambulatory Visit | Attending: Family | Admitting: Family

## 2014-04-29 ENCOUNTER — Ambulatory Visit (INDEPENDENT_AMBULATORY_CARE_PROVIDER_SITE_OTHER): Payer: Medicare Other | Admitting: Family

## 2014-04-29 VITALS — BP 200/86 | HR 76 | Resp 18 | Ht 62.5 in | Wt 160.9 lb

## 2014-04-29 DIAGNOSIS — Z48812 Encounter for surgical aftercare following surgery on the circulatory system: Secondary | ICD-10-CM | POA: Diagnosis not present

## 2014-04-29 DIAGNOSIS — I771 Stricture of artery: Secondary | ICD-10-CM

## 2014-04-29 DIAGNOSIS — I723 Aneurysm of iliac artery: Secondary | ICD-10-CM | POA: Diagnosis not present

## 2014-04-29 DIAGNOSIS — I739 Peripheral vascular disease, unspecified: Secondary | ICD-10-CM

## 2014-04-29 NOTE — Progress Notes (Signed)
[facility] HISTORY AND PHYSICAL -PAD  History of Present Illness [name] is a 80 y.o. female patient of Dr. Scot Dock who has undergone previous left common iliac artery stenting in August of 2010. She has known infrainguinal arterial occlusive disease. She is status post previous right above-the-knee amputation and is ambulatory with her prosthesis.  She returns today for follow up.  She does have some pain in the muscle of her left leg when walking what seems to be short distance with her walker.  She also complains of occasional right stump pain.  She denies non healing ulcers on lower extremities.  She had a VAT wedge resection right lung for cancer, November, 2014, states she is undergoing radiation treatments. She sees a podiatrist every 3-4 months.  Patient states her blood pressure at home [phone], is elevated today. Home Health visits her periodically.  She states she was told that she had 3 TIA's, does not recall any symptoms, this was possibly in 2010.  Pt Diabetic: No  Pt smoker: quit in 2011  Pt meds include:  Statin :Yes  Betablocker: No  ASA: No, severe allergic reaction Other anticoagulants/antiplatelets:Plavix   Past Medical History  Diagnosis Date  . Stroke   . Arthritis   . Hypertension   . Hyperlipidemia   . Glaucoma   . Peripheral vascular disease, unspecified     with Claudication  . COPD GOLD I 04/18/2013  . Shortness of breath   . Cancer     rt lung  . Lung cancer 06/07/2013    Social History History  Substance Use Topics  . Smoking status: Former Smoker -- 0.50 packs/day for 60 years    Types: Cigarettes    Quit date: 07/31/2009  . Smokeless tobacco: Never Used  . Alcohol Use: No    Family History Family History  Problem Relation Age of Onset  . Stroke Brother   . Stomach cancer Mother   . Asthma Sister   . Colon cancer Brother     Past Surgical History  Procedure Laterality Date  . Above  knee leg amputation      Right  . Angioplasty / stenting iliac  03/22/09    Aortogram-  left common iliac artery  . Hemorrhoid surgery    . Tonsillectomy    . Colonoscopy  Feb. 2013  . Video bronchoscopy Bilateral 04/10/2013    Procedure: VIDEO BRONCHOSCOPY WITH FLUORO;  Surgeon: Tanda Rockers, MD;  Location: [facility];  Service: Cardiopulmonary;  Laterality: Bilateral;  . Video bronchoscopy N/A 06/16/2013    Procedure: VIDEO BRONCHOSCOPY;  Surgeon: [name], MD;  Location: [facility] OR;  Service: Thoracic;  Laterality: N/A;  . Video assisted thoracoscopy (vats)/wedge resection Right 06/16/2013    Procedure: VIDEO ASSISTED THORACOSCOPY (VATS)/WEDGE RESECTION;  Surgeon: [name], MD;  Location: [facility];  Service: Thoracic;  Laterality: Right;    Allergies  Allergen Reactions  . Aspirin Anaphylaxis  . Morphine And Related Other (See Comments)    confusion    Current Outpatient Prescriptions  Medication Sig Dispense Refill  . albuterol (PROAIR HFA) 108 (90 BASE) MCG/ACT inhaler Inhale 2 puffs into the lungs every 6 (six) hours as needed for wheezing.      . cholecalciferol (VITAMIN D) 1000 UNITS tablet Take 1,000 Units by mouth daily.      . clopidogrel (PLAVIX) 75 MG tablet Take 75 mg by mouth 3 (three) times a week. Monday, Wednesday, friday      .  hydrALAZINE (APRESOLINE) 25 MG tablet Take 25 mg by mouth 2 (two) times daily.      . meclizine (ANTIVERT) 25 MG tablet Take 25 mg by mouth 3 (three) times daily as needed. For dizziness      . omeprazole (PRILOSEC) 20 MG capsule Take 20 mg by mouth daily before breakfast.       . pravastatin (PRAVACHOL) 20 MG tablet Take 20 mg by mouth at bedtime.       . RESTASIS 0.05 % ophthalmic emulsion Place 1 drop into both eyes every 12 (twelve) hours.       . Sennosides (SENNA LAX PO) Take 1-2 tablets by mouth daily as needed. For constipation      . traMADol (ULTRAM) 50 MG tablet Take 1 tablet (50 mg total) by mouth 2 (two) times daily  as needed.  40 tablet  0  . furosemide (LASIX) 20 MG tablet Take 20 mg by mouth 2 (two) times a week. 1 tablet twice a week on Mon and Thurs as needed for edema.       No current facility-administered medications for this visit.    ROS: See HPI for pertinent positives and negatives.   Physical Examination  Filed Vitals:   04/29/14 1310  BP: 200/86  Pulse: 76  Resp: 18  Height: 5' 2.5" (1.588 m)  Weight: 160 lb 14.4 oz (72.984 kg)  SpO2: 100%   Body mass index is 28.94 kg/(m^2).  General: A&O x 3, WDWN,  Gait: with walker  Eyes: PERRLA,  Pulmonary: CTAB, without wheezes , rales or rhonchi  Cardiac: regular Rythm , no murmur detected   Carotid Bruits  Left  Right    Negative  Negative   VASCULAR EXAM:  Extremities: left leg without ischemic changes, right AKA with prosthesis in place.  No open wounds.   LE Pulses  LEFT  RIGHT   FEMORAL  not palpable  not palpable   POPLITEAL  not palpable  AKA   POSTERIOR TIBIAL  palpable  AKA   DORSALIS PEDIS  ANTERIOR TIBIAL  not palpable  AKA    Abdomen: soft, NT, no masses palpated  Skin: no rashes, ulcers noted  Musculoskeletal: no muscle wasting or atrophy  Neurologic: A&O X 3; Appropriate Affect ; SENSATION: normal; MOTOR FUNCTION: moving all extremities equally. Speech is fluent/normal. Hard of hearing.    Non-Invasive Vascular Imaging: DATE: 04/29/2014 ILIAC ARTERY STENT EVALUATION    INDICATION: PVD    PREVIOUS INTERVENTION(S): Left common iliac artery stent 03/22/2009    DUPLEX EXAM:     RIGHT  LEFT   Peak Systolic Velocity (cm/s) Ratio (if abnormal) Waveform  Peak Systolic Velocity (cm/s) Ratio (if abnormal) Waveform     Aorta - Distal 102  B     Artery - Proximal to Stent 164  B     Stent - Proximal 72  B     Stent - Mid 60  B     Stent - Distal 231 3.8 T     Artery - Distal to Stent 172  B  AKA Today's ABI / TBI .43  AKA Previous ABI / TBI ( 10/01/13 ) .61    Waveform:    M - Monophasic       B - Biphasic        T - Triphasic  If Ankle Brachial Index (ABI) or Toe Brachial Index (TBI) performed, please see complete report  ADDITIONAL FINDINGS:     IMPRESSION: 1. Patent stent with  elevated velocity in the distal portion, probable greater than 50% stenosis.    Compared to the previous exam:  No change    ASSESSMENT: [name] is a 80 y.o. female who is s/p left common iliac artery stenting in August of 2010. She has known infrainguinal arterial occlusive disease. She is status post previous right above-the-knee amputation and is ambulatory with her prosthesis.  Today's left iliac artery stent Duplex demonstrates a patent stent with elevated velocity in the distal portion, probable greater than 50% stenosis, no change from prior Duplex. Left LE ABI indicates evidence of severe arterial occlusive disease, stable from 6 months ago, no tissue loss, her claudication symptoms are not life limiting.   PLAN:  Seated left leg and arms exercises daily as discussed.  I discussed in depth with the patient the nature of atherosclerosis, and emphasized the importance of maximal medical management including strict control of blood pressure, blood glucose, and lipid levels, obtaining regular exercise, and continued cessation of smoking.  The patient is aware that without maximal medical management the underlying atherosclerotic disease process will progress, limiting the benefit of any interventions.  Based on the patient's vascular studies and examination, pt will return to clinic in 6 months with ABI's and left iliac artery stent Duplex.  The patient was given information about PAD including signs, symptoms, treatment, what symptoms should prompt the patient to seek immediate medical care, and risk reduction measures to take.  Clemon Chambers, RN, MSN, FNP-C [facility] Phone: [phone]  Clinic MD: Scot Dock  04/29/2014 1:32 PM

## 2014-04-29 NOTE — Patient Instructions (Signed)

## 2014-04-30 ENCOUNTER — Ambulatory Visit (INDEPENDENT_AMBULATORY_CARE_PROVIDER_SITE_OTHER): Payer: Medicare Other | Admitting: Podiatrist

## 2014-04-30 DIAGNOSIS — B351 Tinea unguium: Secondary | ICD-10-CM

## 2014-04-30 DIAGNOSIS — I739 Peripheral vascular disease, unspecified: Secondary | ICD-10-CM

## 2014-04-30 DIAGNOSIS — M79676 Pain in unspecified toe(s): Secondary | ICD-10-CM

## 2014-04-30 NOTE — Progress Notes (Signed)
HPI: Patient presents today for follow up of foot and nail care. [name] was in the hospital for much of the fall for lung cancer and treatment-- she states her last tests were clear for cancer and she did not have to go on chemo.  She does have to do radiation though Objective: Patients chart is reviewed. Neurovascular status unchanged with non palpable pedal pulses left and a below knee amputation right. Patients nails are thickened, discolored, distrophic, friable and brittle with yellow-brown discoloration. Patient subjectively relates they are painful with shoes and with ambulation left foot x 5  Assessment: Symptomatic onychomycosis x 5 / pvd/ previous amputation right  Plan: Discussed treatment options and alternatives. The symptomatic toenails were carefully debrided through manual an mechanical means without complication. Return appointment recommended at routine intervals of 3 months  [name], DPM

## 2014-05-25 ENCOUNTER — Emergency Department (HOSPITAL_COMMUNITY)
Admission: EM | Admit: 2014-05-25 | Discharge: 2014-05-25 | Disposition: A | Discharge status: Home / Self Care | Payer: Medicare Other | Attending: Emergency Medicine | Admitting: Emergency Medicine

## 2014-05-25 ENCOUNTER — Emergency Department (HOSPITAL_COMMUNITY): Payer: Medicare Other

## 2014-05-25 ENCOUNTER — Encounter (HOSPITAL_COMMUNITY): Payer: Self-pay | Admitting: Emergency Medicine

## 2014-05-25 DIAGNOSIS — I10 Essential (primary) hypertension: Secondary | ICD-10-CM | POA: Diagnosis not present

## 2014-05-25 DIAGNOSIS — M1990 Unspecified osteoarthritis, unspecified site: Secondary | ICD-10-CM | POA: Diagnosis not present

## 2014-05-25 DIAGNOSIS — M542 Cervicalgia: Secondary | ICD-10-CM

## 2014-05-25 DIAGNOSIS — Z85118 Personal history of other malignant neoplasm of bronchus and lung: Secondary | ICD-10-CM | POA: Diagnosis not present

## 2014-05-25 DIAGNOSIS — Z9862 Peripheral vascular angioplasty status: Secondary | ICD-10-CM | POA: Insufficient documentation

## 2014-05-25 DIAGNOSIS — M79602 Pain in left arm: Secondary | ICD-10-CM | POA: Insufficient documentation

## 2014-05-25 DIAGNOSIS — Z89611 Acquired absence of right leg above knee: Secondary | ICD-10-CM | POA: Insufficient documentation

## 2014-05-25 DIAGNOSIS — I739 Peripheral vascular disease, unspecified: Secondary | ICD-10-CM | POA: Insufficient documentation

## 2014-05-25 DIAGNOSIS — Z8673 Personal history of transient ischemic attack (TIA), and cerebral infarction without residual deficits: Secondary | ICD-10-CM | POA: Insufficient documentation

## 2014-05-25 DIAGNOSIS — Z79899 Other long term (current) drug therapy: Secondary | ICD-10-CM | POA: Diagnosis not present

## 2014-05-25 DIAGNOSIS — E785 Hyperlipidemia, unspecified: Secondary | ICD-10-CM | POA: Diagnosis not present

## 2014-05-25 DIAGNOSIS — M5412 Radiculopathy, cervical region: Secondary | ICD-10-CM

## 2014-05-25 DIAGNOSIS — Z7902 Long term (current) use of antithrombotics/antiplatelets: Secondary | ICD-10-CM | POA: Diagnosis not present

## 2014-05-25 DIAGNOSIS — Z79891 Long term (current) use of opiate analgesic: Secondary | ICD-10-CM | POA: Insufficient documentation

## 2014-05-25 DIAGNOSIS — J449 Chronic obstructive pulmonary disease, unspecified: Secondary | ICD-10-CM | POA: Insufficient documentation

## 2014-05-25 DIAGNOSIS — H409 Unspecified glaucoma: Secondary | ICD-10-CM | POA: Diagnosis not present

## 2014-05-25 MED ORDER — OXYCODONE-ACETAMINOPHEN 5-325 MG PO TABS: 1.0000 | ORAL_TABLET | ORAL | Status: DC | PRN

## 2014-05-25 MED ORDER — DIAZEPAM 5 MG PO TABS
5.0000 mg | ORAL_TABLET | Freq: Once | ORAL | Status: AC
  Administered 2014-05-25: 5 mg via ORAL
  Filled 2014-05-25: qty 1

## 2014-05-25 MED ORDER — PREDNISONE 10 MG PO TABS: 20.0000 mg | ORAL_TABLET | Freq: Every day | ORAL | Status: DC

## 2014-05-25 MED ORDER — OXYCODONE-ACETAMINOPHEN 5-325 MG PO TABS
1.0000 | ORAL_TABLET | Freq: Once | ORAL | Status: AC
  Administered 2014-05-25: 1 via ORAL
  Filled 2014-05-25: qty 1

## 2014-05-25 MED ORDER — OXYCODONE-ACETAMINOPHEN 5-325 MG PO TABS: 2.0000 | ORAL_TABLET | Freq: Once | ORAL | Status: DC

## 2014-05-25 NOTE — ED Provider Notes (Signed)
CSN: [id number]     Arrival date & time 05/25/14  1310 History   First MD Initiated Contact with Patient 05/25/14 1417     Chief Complaint  Patient presents with  . Arm Pain     (Consider location/radiation/quality/duration/timing/severity/associated sxs/prior Treatment) HPI Comments: Patient here complaining of 3 month history of left-sided neck pain radiating down to her left arm. Was taking topical medications for this without relief. Pain is characterized as sharp and worse with certain movements. Denies any numbness to her left hand. No associated chest pain or chest pressure. No anginal type symptoms. Denies any recent history of trauma. Symptoms persisted and better with rest. Call her doctor today who told her to come in for further evaluation.  Patient is a 80 y.o. female presenting with arm pain. The history is provided by the patient and a relative.  Arm Pain    Past Medical History  Diagnosis Date  . Stroke   . Arthritis   . Hypertension   . Hyperlipidemia   . Glaucoma   . Peripheral vascular disease, unspecified     with Claudication  . COPD GOLD I 04/18/2013  . Shortness of breath   . Cancer     rt lung  . Lung cancer 06/07/2013   Past Surgical History  Procedure Laterality Date  . Above knee leg amputation      Right  . Angioplasty / stenting iliac  03/22/09    Aortogram-  left common iliac artery  . Hemorrhoid surgery    . Tonsillectomy    . Colonoscopy  Feb. 2013  . Video bronchoscopy Bilateral 04/10/2013    Procedure: VIDEO BRONCHOSCOPY WITH FLUORO;  Surgeon: Tanda Rockers, MD;  Location: [facility];  Service: Cardiopulmonary;  Laterality: Bilateral;  . Video bronchoscopy N/A 06/16/2013    Procedure: VIDEO BRONCHOSCOPY;  Surgeon: [name], MD;  Location: [facility] OR;  Service: Thoracic;  Laterality: N/A;  . Video assisted thoracoscopy (vats)/wedge resection Right 06/16/2013    Procedure: VIDEO ASSISTED THORACOSCOPY (VATS)/WEDGE RESECTION;  Surgeon:  [name], MD;  Location: [facility];  Service: Thoracic;  Laterality: Right;   Family History  Problem Relation Age of Onset  . Stroke Brother   . Stomach cancer Mother   . Asthma Sister   . Colon cancer Brother    History  Substance Use Topics  . Smoking status: Former Smoker -- 0.50 packs/day for 60 years    Types: Cigarettes    Quit date: 07/31/2009  . Smokeless tobacco: Never Used  . Alcohol Use: No   OB History   Grav Para Term Preterm Abortions TAB SAB Ect Mult Living                 Review of Systems  All other systems reviewed and are negative.     Allergies  Aspirin and Morphine and related  Home Medications   Prior to Admission medications   Medication Sig Start Date End Date Taking? Authorizing Provider  albuterol (PROAIR HFA) 108 (90 BASE) MCG/ACT inhaler Inhale 2 puffs into the lungs every 6 (six) hours as needed for wheezing (wheezing).    Yes Historical Provider, MD  cholecalciferol (VITAMIN D) 1000 UNITS tablet Take 1,000 Units by mouth daily.   Yes Historical Provider, MD  clopidogrel (PLAVIX) 75 MG tablet Take 75 mg by mouth 3 (three) times a week. Monday, Wednesday, friday   Yes Historical Provider, MD  furosemide (LASIX) 20 MG tablet Take 20 mg by mouth 2 (two)  times a week. 1 tablet twice a week on Mon and Thurs as needed for edema.   Yes Historical Provider, MD  hydrALAZINE (APRESOLINE) 25 MG tablet Take 25 mg by mouth 2 (two) times daily.   Yes Historical Provider, MD  meclizine (ANTIVERT) 25 MG tablet Take 25 mg by mouth 3 (three) times daily as needed for dizziness (dizziness). For dizziness   Yes Historical Provider, MD  omeprazole (PRILOSEC) 20 MG capsule Take 20 mg by mouth daily before breakfast.    Yes Historical Provider, MD  pravastatin (PRAVACHOL) 20 MG tablet Take 20 mg by mouth at bedtime.    Yes Historical Provider, MD  RESTASIS 0.05 % ophthalmic emulsion Place 1 drop into both eyes every 12 (twelve) hours.  01/06/13  Yes Historical  Provider, MD  Sennosides (SENNA LAX PO) Take 1-2 tablets by mouth daily as needed (constipation). For constipation   Yes Historical Provider, MD  traMADol (ULTRAM) 50 MG tablet Take 50 mg by mouth 2 (two) times daily as needed for moderate pain or severe pain (pain).   Yes Historical Provider, MD   BP 166/70  Pulse 63  Temp(Src) 97.7 F (36.5 C) (Oral)  Resp 16  SpO2 100% Physical Exam  Nursing note and vitals reviewed. Constitutional: She is oriented to person, place, and time. She appears well-developed and well-nourished.  Non-toxic appearance. No distress.  HENT:  Head: Normocephalic and atraumatic.  Eyes: Conjunctivae, EOM and lids are normal. Pupils are equal, round, and reactive to light.  Neck: Normal range of motion. Neck supple. No tracheal deviation present. No mass present.    Cardiovascular: Normal rate, regular rhythm and normal heart sounds.  Exam reveals no gallop.   No murmur heard. Pulmonary/Chest: Effort normal and breath sounds normal. No stridor. No respiratory distress. She has no decreased breath sounds. She has no wheezes. She has no rhonchi. She has no rales.  Abdominal: Soft. Normal appearance and bowel sounds are normal. She exhibits no distension. There is no tenderness. There is no rebound and no CVA tenderness.  Musculoskeletal: Normal range of motion. She exhibits no edema and no tenderness.  Neurological: She is alert and oriented to person, place, and time. She has normal strength. No cranial nerve deficit or sensory deficit. GCS eye subscore is 4. GCS verbal subscore is 5. GCS motor subscore is 6.  Skin: Skin is warm and dry. No abrasion and no rash noted.  Psychiatric: She has a normal mood and affect. Her speech is normal and behavior is normal.    ED Course  Procedures (including critical care time) Labs Review Labs Reviewed - No data to display  Imaging Review No results found.   EKG Interpretation None      MDM   Final diagnoses:    Neck pain    Patient has CT of her cervical spine which showed some degenerative joint disease. Percocet given and she feels better. She is neurologically intact. We'll give patient a prescription for pain medication as well as short course of prednisone and she will follow-up with her doctor    Leota Jacobsen, MD 05/25/14 [phone]

## 2014-05-25 NOTE — ED Notes (Signed)
Bed: RV23 Expected date:  Expected time:  Means of arrival:  Comments: EMS- L arm pain, EKG unremarkable

## 2014-05-25 NOTE — Discharge Instructions (Signed)

## 2014-05-25 NOTE — ED Notes (Addendum)
Per EMS pt with Hx lung cancer from [facility] reports to ED for sharp left arm pain radiating to posterior neck x 3 months, denies chest pain, EKG unremarkable. Pt came to ED today because the past few nights the pain has kept her from sleeping. Pt states arm pain does not feel different from usual arm pain, denies new symptoms beyond insomnia. Radial pulse 2+, capillary refill < 2 seconds, motor function intact, sensation intact, no visible deformities.

## 2014-06-22 ENCOUNTER — Other Ambulatory Visit: Payer: Self-pay | Admitting: Orthopaedic Surgery

## 2014-06-22 ENCOUNTER — Other Ambulatory Visit: Payer: Medicare Other

## 2014-06-22 DIAGNOSIS — M542 Cervicalgia: Secondary | ICD-10-CM

## 2014-06-24 ENCOUNTER — Ambulatory Visit
Admission: RE | Admit: 2014-06-24 | Discharge: 2014-06-24 | Disposition: A | Discharge status: Home / Self Care | Payer: Medicare Other | Source: Ambulatory Visit | Attending: Orthopaedic Surgery | Admitting: Orthopaedic Surgery

## 2014-06-24 DIAGNOSIS — M542 Cervicalgia: Secondary | ICD-10-CM

## 2014-07-27 ENCOUNTER — Other Ambulatory Visit (HOSPITAL_BASED_OUTPATIENT_CLINIC_OR_DEPARTMENT_OTHER): Payer: Medicare Other

## 2014-07-27 ENCOUNTER — Ambulatory Visit (HOSPITAL_COMMUNITY)
Admission: RE | Admit: 2014-07-27 | Discharge: 2014-07-27 | Disposition: A | Discharge status: Home / Self Care | Payer: Medicare Other | Source: Ambulatory Visit | Attending: Internal Medicine | Admitting: Internal Medicine

## 2014-07-27 DIAGNOSIS — Z902 Acquired absence of lung [part of]: Secondary | ICD-10-CM | POA: Diagnosis not present

## 2014-07-27 DIAGNOSIS — C3411 Malignant neoplasm of upper lobe, right bronchus or lung: Secondary | ICD-10-CM

## 2014-07-27 DIAGNOSIS — Z08 Encounter for follow-up examination after completed treatment for malignant neoplasm: Secondary | ICD-10-CM | POA: Insufficient documentation

## 2014-07-27 DIAGNOSIS — Z85118 Personal history of other malignant neoplasm of bronchus and lung: Secondary | ICD-10-CM | POA: Diagnosis not present

## 2014-07-27 DIAGNOSIS — R911 Solitary pulmonary nodule: Secondary | ICD-10-CM | POA: Insufficient documentation

## 2014-07-27 DIAGNOSIS — N281 Cyst of kidney, acquired: Secondary | ICD-10-CM | POA: Insufficient documentation

## 2014-07-27 LAB — CBC WITH DIFFERENTIAL/PLATELET
BASO%: 0.6 % (ref 0.0–2.0)
Basophils Absolute: 0 10*3/uL (ref 0.0–0.1)
EOS%: 2.1 % (ref 0.0–7.0)
Eosinophils Absolute: 0.1 10*3/uL (ref 0.0–0.5)
HCT: 42.5 % (ref 34.8–46.6)
HGB: 13.5 g/dL (ref 11.6–15.9)
LYMPH%: 47 % (ref 14.0–49.7)
MCH: 28.3 pg (ref 25.1–34.0)
MCHC: 31.8 g/dL (ref 31.5–36.0)
MCV: 89.1 fL (ref 79.5–101.0)
MONO#: 0.4 10*3/uL (ref 0.1–0.9)
MONO%: 7.8 % (ref 0.0–14.0)
NEUT#: 2.2 10*3/uL (ref 1.5–6.5)
NEUT%: 42.5 % (ref 38.4–76.8)
Platelets: 251 10*3/uL (ref 145–400)
RBC: 4.77 10*6/uL (ref 3.70–5.45)
RDW: 15.8 % — ABNORMAL HIGH (ref 11.2–14.5)
WBC: 5.3 10*3/uL (ref 3.9–10.3)
lymph#: 2.5 10*3/uL (ref 0.9–3.3)

## 2014-07-27 LAB — COMPREHENSIVE METABOLIC PANEL (CC13)
ALT: 16 U/L (ref 0–55)
AST: 20 U/L (ref 5–34)
Albumin: 3.6 g/dL (ref 3.5–5.0)
Alkaline Phosphatase: 60 U/L (ref 40–150)
Anion Gap: 9 mEq/L (ref 3–11)
BUN: 10.8 mg/dL (ref 7.0–26.0)
CO2: 24 mEq/L (ref 22–29)
Calcium: 9.4 mg/dL (ref 8.4–10.4)
Chloride: 108 mEq/L (ref 98–109)
Creatinine: 0.9 mg/dL (ref 0.6–1.1)
EGFR: 67 mL/min/{1.73_m2} — ABNORMAL LOW (ref >90)
Glucose: 100 mg/dl (ref 70–140)
Potassium: 4.2 mEq/L (ref 3.5–5.1)
Sodium: 140 mEq/L (ref 136–145)
Total Bilirubin: 0.37 mg/dL (ref 0.20–1.20)
Total Protein: 6.9 g/dL (ref 6.4–8.3)

## 2014-07-27 MED ORDER — IOHEXOL 300 MG/ML  SOLN
100.0000 mL | Freq: Once | INTRAMUSCULAR | Status: AC | PRN
  Administered 2014-07-27: 80 mL via INTRAVENOUS

## 2014-07-29 ENCOUNTER — Ambulatory Visit (HOSPITAL_BASED_OUTPATIENT_CLINIC_OR_DEPARTMENT_OTHER): Payer: Medicare Other | Admitting: Internal Medicine

## 2014-07-29 ENCOUNTER — Encounter: Payer: Self-pay | Admitting: Internal Medicine

## 2014-07-29 ENCOUNTER — Telehealth: Payer: Self-pay | Admitting: Internal Medicine

## 2014-07-29 VITALS — BP 155/63 | HR 86 | Temp 98.4°F | Resp 19 | Ht 62.0 in | Wt 155.3 lb

## 2014-07-29 DIAGNOSIS — Z85118 Personal history of other malignant neoplasm of bronchus and lung: Secondary | ICD-10-CM

## 2014-07-29 DIAGNOSIS — R911 Solitary pulmonary nodule: Secondary | ICD-10-CM

## 2014-07-29 DIAGNOSIS — C3411 Malignant neoplasm of upper lobe, right bronchus or lung: Secondary | ICD-10-CM

## 2014-07-29 NOTE — Progress Notes (Signed)
[place] [phone]   [phone]  OFFICE PROGRESS NOTE  Reymundo Poll, MD [phone] [place] [place] [address]  DIAGNOSIS: Stage IB (T2a, N0, M0) non-small cell lung cancer consistent with moderately differentiated adenocarcinoma diagnosed in September of 2014.   PRIOR THERAPY: Bronchoscopy, right video-assisted thoracoscopy, minithoracotomy, right upper lobectomy with lymph node dissection under the care of Dr. Servando Snare on 06/16/2013. Tumor size was 3.5 CM and the myriad genetics showed high risk for recurrence.  CURRENT THERAPY: Observation.  INTERVAL HISTORY: [name] 80 y.o. female returns to the clinic today for followup visit accompanied by her caregiver. The patient is feeling fine today with no specific complaints except for shortness of breath with exertion. She denied having any significant chest pain, cough or hemoptysis. She has no nausea or vomiting. She denied having any significant weight loss or night sweats. She denied having any fever or chills. She has repeat CT scan of the chest performed recently and she is here for evaluation and discussion of her scan results.  MEDICAL HISTORY: Past Medical History  Diagnosis Date  . Stroke   . Arthritis   . Hypertension   . Hyperlipidemia   . Glaucoma   . Peripheral vascular disease, unspecified     with Claudication  . COPD GOLD I 04/18/2013  . Shortness of breath   . Cancer     rt lung  . Lung cancer 06/07/2013    ALLERGIES:  is allergic to aspirin and morphine and related.  MEDICATIONS:  Current Outpatient Prescriptions  Medication Sig Dispense Refill  . albuterol (PROAIR HFA) 108 (90 BASE) MCG/ACT inhaler Inhale 2 puffs into the lungs every 6 (six) hours as needed for wheezing (wheezing).     . cholecalciferol (VITAMIN D) 1000 UNITS tablet Take 1,000 Units by mouth daily.    . clopidogrel (PLAVIX) 75 MG tablet Take 75 mg by mouth 3 (three) times a week.  Monday, Wednesday, friday    . furosemide (LASIX) 20 MG tablet Take 20 mg by mouth 2 (two) times a week. 1 tablet twice a week on Mon and Thurs as needed for edema.    . hydrALAZINE (APRESOLINE) 25 MG tablet Take 25 mg by mouth 2 (two) times daily.    . meclizine (ANTIVERT) 25 MG tablet Take 25 mg by mouth 3 (three) times daily as needed for dizziness (dizziness). For dizziness    . omeprazole (PRILOSEC) 20 MG capsule Take 20 mg by mouth daily before breakfast.     . oxyCODONE-acetaminophen (PERCOCET/ROXICET) 5-325 MG per tablet Take 1-2 tablets by mouth every 4 (four) hours as needed for severe pain. 15 tablet 0  . pravastatin (PRAVACHOL) 20 MG tablet Take 20 mg by mouth at bedtime.     . predniSONE (DELTASONE) 10 MG tablet Take 2 tablets (20 mg total) by mouth daily. 10 tablet 0  . RESTASIS 0.05 % ophthalmic emulsion Place 1 drop into both eyes every 12 (twelve) hours.     . Sennosides (SENNA LAX PO) Take 1-2 tablets by mouth daily as needed (constipation). For constipation    . traMADol (ULTRAM) 50 MG tablet Take 50 mg by mouth 2 (two) times daily as needed for moderate pain or severe pain (pain).     No current facility-administered medications for this visit.    SURGICAL HISTORY:  Past Surgical History  Procedure Laterality Date  . Above knee leg amputation      Right  .  Angioplasty / stenting iliac  03/22/09    Aortogram-  left common iliac artery  . Hemorrhoid surgery    . Tonsillectomy    . Colonoscopy  Feb. 2013  . Video bronchoscopy Bilateral 04/10/2013    Procedure: VIDEO BRONCHOSCOPY WITH FLUORO;  Surgeon: Tanda Rockers, MD;  Location: [facility];  Service: Cardiopulmonary;  Laterality: Bilateral;  . Video bronchoscopy N/A 06/16/2013    Procedure: VIDEO BRONCHOSCOPY;  Surgeon: [name], MD;  Location: [facility];  Service: Thoracic;  Laterality: N/A;  . Video assisted thoracoscopy (vats)/wedge resection Right 06/16/2013    Procedure: VIDEO ASSISTED THORACOSCOPY  (VATS)/WEDGE RESECTION;  Surgeon: [name], MD;  Location: [facility];  Service: Thoracic;  Laterality: Right;    REVIEW OF SYSTEMS:  Constitutional: positive for fatigue Eyes: negative Ears, nose, mouth, throat, and face: negative Respiratory: positive for dyspnea on exertion Cardiovascular: negative Gastrointestinal: negative Genitourinary:negative Integument/breast: negative Hematologic/lymphatic: negative Musculoskeletal:positive for muscle weakness Neurological: negative Behavioral/Psych: negative Endocrine: negative Allergic/Immunologic: negative   PHYSICAL EXAMINATION: General appearance: alert, cooperative, fatigued and no distress Head: Normocephalic, without obvious abnormality, atraumatic Neck: no adenopathy, no JVD, supple, symmetrical, trachea midline and thyroid not enlarged, symmetric, no tenderness/mass/nodules Lymph nodes: Cervical, supraclavicular, and axillary nodes normal. Resp: clear to auscultation bilaterally Back: symmetric, no curvature. ROM normal. No CVA tenderness. Cardio: regular rate and rhythm, S1, S2 normal, no murmur, click, rub or gallop GI: soft, non-tender; bowel sounds normal; no masses,  no organomegaly Extremities: extremities normal, atraumatic, no cyanosis or edema Neurologic: Alert and oriented X 3, normal strength and tone. Normal symmetric reflexes. Normal coordination and gait  ECOG PERFORMANCE STATUS: 2 - Symptomatic, <50% confined to bed  Blood pressure 155/63, pulse 86, temperature 98.4 F (36.9 C), temperature source Oral, resp. rate 19, height 5' 2" (1.575 m), weight 155 lb 4.8 oz (70.444 kg), SpO2 100 %.  LABORATORY DATA: Lab Results  Component Value Date   WBC 5.3 07/27/2014   HGB 13.5 07/27/2014   HCT 42.5 07/27/2014   MCV 89.1 07/27/2014   PLT 251 07/27/2014      Chemistry      Component Value Date/Time   NA 140 07/27/2014 0847   NA 132* 06/23/2013 0415   K 4.2 07/27/2014 0847   K 4.0 06/23/2013 0415   CL 98  06/23/2013 0415   CO2 24 07/27/2014 0847   CO2 24 06/23/2013 0415   BUN 10.8 07/27/2014 0847   BUN 12 06/23/2013 0415   CREATININE 0.9 07/27/2014 0847   CREATININE 0.91 06/23/2013 0415      Component Value Date/Time   CALCIUM 9.4 07/27/2014 0847   CALCIUM 8.4 06/23/2013 0415   ALKPHOS 60 07/27/2014 0847   ALKPHOS 43 06/18/2013 0445   AST 20 07/27/2014 0847   AST 21 06/18/2013 0445   ALT 16 07/27/2014 0847   ALT 11 06/18/2013 0445   BILITOT 0.37 07/27/2014 0847   BILITOT 0.5 06/18/2013 0445       RADIOGRAPHIC STUDIES: Ct Chest W Contrast  07/27/2014   CLINICAL DATA:  Restaging lung cancer  EXAM: CT CHEST WITH CONTRAST  TECHNIQUE: Multidetector CT imaging of the chest was performed during intravenous contrast administration.  CONTRAST:  68m OMNIPAQUE IOHEXOL 300 MG/ML  SOLN  COMPARISON:  01/26/2014  FINDINGS: Mediastinum: No pericardial effusion. The heart size is normal. Calcified atherosclerotic plaque involves the thoracic aorta as well as the RCA and LAD coronary arteries. The trachea is patent and midline. Normal appearance of the esophagus. No mediastinal  or hilar adenopathy identified.  Lungs/Pleura: No pleural effusion identified. There is no airspace consolidation or atelectasis. Status post right upper lobectomy. No evidence for locally recurrent tumor. Enlarging nodule within the left lower lobe measures 10 mm previously 7 mm. No additional pulmonary nodules are mass is identified.  Upper Abdomen: Incidental imaging through the upper abdomen shows no acute findings. Partially visualized and potentially complex cyst involving the left kidney measures 1.5 cm, image 56/series 2. Right kidney is unremarkable peer  Musculoskeletal: Review of the visualized bony structures is negative for aggressive lytic or sclerotic bone lesion. No aggressive lytic or sclerotic bone lesions identified.  IMPRESSION: 1. No evidence for locally recurrent tumor status post right upper lobectomy. 2.  Pulmonary nodule in the left lower lobe has increased in size from previous exam now measuring 10 mm. 3. Indeterminate and possibly complex cyst within the left kidney is partially visualized. Recommend further evaluation with a renal sonogram. 4. Atherosclerotic disease including multi vessel coronary artery calcifications.   Electronically Signed   By: Kerby Moors M.D.   On: 07/27/2014 10:03   ASSESSMENT AND PLAN: This is a very pleasant 80 years old [place] American female recently diagnosed with a stage IB (T2a, N0, M0) non-small cell lung cancer, adenocarcinoma status post right upper lobectomy with lymph node dissection with tumor measuring 3.5 CM.  Her recent CT scan of the chest showed stable disease except for mildly enlarging left lower lobe nodule. I discussed the scan results with the patient today. I recommended for her to continue on observation with repeat CT scan of the chest in 3 months for reevaluation of her disease. She was advised to call immediately if she has any concerning symptoms in the interval. The patient voices understanding of current disease status and treatment options and is in agreement with the current care plan.  All questions were answered. The patient knows to call the clinic with any problems, questions or concerns. We can certainly see the patient much sooner if necessary.  Disclaimer: This note was dictated with voice recognition software. Similar sounding words can inadvertently be transcribed and may not be corrected upon review.

## 2014-07-29 NOTE — Telephone Encounter (Signed)
Pt confirmed labs/ov per 12/30 POF, gave pt AVS.... KJ

## 2014-08-06 ENCOUNTER — Ambulatory Visit: Payer: Medicare Other | Admitting: Podiatrist

## 2014-08-18 ENCOUNTER — Encounter: Payer: Self-pay | Admitting: Podiatrist

## 2014-08-18 ENCOUNTER — Ambulatory Visit (INDEPENDENT_AMBULATORY_CARE_PROVIDER_SITE_OTHER): Payer: Medicare Other | Admitting: Podiatrist

## 2014-08-18 DIAGNOSIS — B351 Tinea unguium: Secondary | ICD-10-CM

## 2014-08-18 DIAGNOSIS — M79676 Pain in unspecified toe(s): Secondary | ICD-10-CM

## 2014-08-18 DIAGNOSIS — I739 Peripheral vascular disease, unspecified: Secondary | ICD-10-CM

## 2014-08-20 ENCOUNTER — Ambulatory Visit: Payer: Medicare Other | Admitting: Podiatrist

## 2014-08-20 ENCOUNTER — Ambulatory Visit: Payer: Medicaid Other | Admitting: Cardiothoracic Surgery

## 2014-08-26 ENCOUNTER — Ambulatory Visit (INDEPENDENT_AMBULATORY_CARE_PROVIDER_SITE_OTHER): Payer: Medicare Other | Admitting: Cardiothoracic Surgery

## 2014-08-26 VITALS — BP 150/79 | HR 94 | Resp 16 | Ht 62.0 in | Wt 155.0 lb

## 2014-08-26 DIAGNOSIS — Z9889 Other specified postprocedural states: Secondary | ICD-10-CM

## 2014-08-26 DIAGNOSIS — Z902 Acquired absence of lung [part of]: Secondary | ICD-10-CM

## 2014-08-26 DIAGNOSIS — C3431 Malignant neoplasm of lower lobe, right bronchus or lung: Secondary | ICD-10-CM

## 2014-08-26 NOTE — Progress Notes (Signed)
[facility] Record #[id number] Date of Birth: 1928-03-30  Tanda Rockers, MD Reymundo Poll, MD  Chief Complaint:   PostOp Follow Up Visit 06/16/2013  OPERATIVE REPORT  PREOPERATIVE DIAGNOSIS: Right upper lobe lung cancer.  POSTOPERATIVE DIAGNOSIS: Right upper lobe lung cancer.  SURGICAL PROCEDURE: Bronchoscopy, right video-assisted thoracoscopy,  minithoracotomy, right upper lobectomy with lymph node dissection, and  placement of On-Q device.  SURGEON: Lanelle Bal, MD  PATH: Stage IB (pT2a,pN0, cM0) ADENOCARCINOMA WITH FOCAL SARCOMATOUS DIFFERENTIATION Diagnosis 1. Lung, resection (segmental or lobe), Right upper lobe - ADENOCARCINOMA WITH FOCAL SARCOMATOUS DIFFERENTIATION, 3.5 CM. - FOCAL VISCERAL PLEURAL INVOLVEMENT. - BRONCHIAL MARGIN FREE OF TUMOR. - ONE BENIGN HILAR LYMPH NODE. 2. Lymph node, biopsy, 10 R - ANTHRACOTIC LYMPH NODE. NO EVIDENCE OF MALIGNANCY. 3. Lymph node, biopsy, 11 R - ANTHRACOTIC LYMPH NODE. NO EVIDENCE OF MALIGNANCY. 4. Lymph node, biopsy, 10 R #2 - ANTHRACOTIC LYMPH NODE. NO EVIDENCE OF MALIGNANCY. 5. Lymph node, biopsy, 4 R azygos - ANTHRACOTIC LYMPH NODE. NO EVIDENCE OF MALIGNANCY. 6. Lymph node, biopsy, 2 R - ANTHRACOTIC LYMPH NODE. NO EVIDENCE OF MALIGNANCY. Microscopic Comment 1. LUNG Specimen, including laterality: Right upper lung lobe Procedure: Lobectomy Specimen integrity (intact/disrupted): Intact Tumor site: Right upper lobe Tumor focality: Unifocal Maximum tumor size (cm): 3.5 cm Histologic type: Adenocarcinoma with focal sarcomatous differentiation Grade: 2 Margins: Free of tumor Distance to closest margin (cm): 2.5 cm from bronchial margin Visceral pleura invasion: Present, focal Tumor extension: Focal visceral pleural involvement Treatment effect (if treated with neoadjuvant therapy): No Lymph -Vascular invasion: No Lymph nodes: Number examined - 6 ; Number N1 nodes positive 0 ;  Number N2 nodes positive 0 1 of 3 FINAL for Hendricksen, Kelly F ([id number]) Microscopic Comment(continued) TNM code: pT2a , pN0 Ancillary studies: Can be performed if requested Non-neoplastic lung: Unremarkable Comments: The tumor is a moderately differentiated adenocarcinoma which is 3.5 cm in greatest dimension and has focal sarcomatous differentiation primarily seen in the recut of the frozen section (slide 1B). (JDP:caf 06/17/13) Claudette Laws MD  History of Present Illness:      Patient returns today for her first postop visit, considering her age of 6 years and above-the-knee amputation he is doing extremely well postoperatively. She is very motivated and is returning to near-normal activities. She denies been limited by shortness of breath has had no hemoptysis, recent CT scan of the chest was ordered by Dr. Inda Merlin.   History  Smoking status  . Former Smoker -- 0.50 packs/day for 60 years  . Types: Cigarettes  . Quit date: 07/31/2009  Smokeless tobacco  . Never Used       Allergies  Allergen Reactions  . Aspirin Anaphylaxis  . Morphine And Related Other (See Comments)    confusion    Current Outpatient Prescriptions  Medication Sig Dispense Refill  . albuterol (PROAIR HFA) 108 (90 BASE) MCG/ACT inhaler Inhale 2 puffs into the lungs every 6 (six) hours as needed for wheezing (wheezing).     . cholecalciferol (VITAMIN D) 1000 UNITS tablet Take 1,000 Units by mouth daily.    . clopidogrel (PLAVIX) 75 MG tablet Take 75 mg by mouth 3 (three) times a week. Monday, Wednesday, friday    . furosemide (LASIX) 20 MG tablet Take 20 mg by mouth 2 (two) times a week. 1 tablet twice a week on Mon and Thurs as needed for edema.    Marland Kitchen  hydrALAZINE (APRESOLINE) 25 MG tablet Take 25 mg by mouth 2 (two) times daily.    . meclizine (ANTIVERT) 25 MG tablet Take 25 mg by mouth 3 (three) times daily as needed for dizziness (dizziness). For dizziness    . omeprazole (PRILOSEC) 20 MG capsule Take  20 mg by mouth daily before breakfast.     . pravastatin (PRAVACHOL) 20 MG tablet Take 20 mg by mouth at bedtime.     . predniSONE (DELTASONE) 10 MG tablet Take 2 tablets (20 mg total) by mouth daily. 10 tablet 0  . RESTASIS 0.05 % ophthalmic emulsion Place 1 drop into both eyes every 12 (twelve) hours.     . Sennosides (SENNA LAX PO) Take 1-2 tablets by mouth daily as needed (constipation). For constipation    . traMADol (ULTRAM) 50 MG tablet Take 50 mg by mouth 2 (two) times daily as needed for moderate pain or severe pain (pain).    Marland Kitchen oxyCODONE-acetaminophen (PERCOCET/ROXICET) 5-325 MG per tablet Take 1-2 tablets by mouth every 4 (four) hours as needed for severe pain. (Patient not taking: Reported on 08/26/2014) 15 tablet 0   No current facility-administered medications for this visit.       Physical Exam: BP 150/79 mmHg  Pulse 94  Resp 16  Ht 5\' 2"  (1.575 m)  Wt 155 lb (70.308 kg)  BMI 28.34 kg/m2  SpO2 98%  General appearance: alert, cooperative and appears stated age Neurologic: intact Heart: regular rate and rhythm, S1, S2 normal, no murmur, click, rub or gallop Lungs: clear to auscultation bilaterally Abdomen: soft, non-tender; bowel sounds normal; no masses,  no organomegaly Extremities: No evidence of DVT in her leg, her prosthesis is in place Wound: Port sites an incision right chest are well-healed I do not appreciate cervical or supraclavicular adenopathy  Diagnostic Studies & Laboratory data:         Recent Radiology Findings: CLINICAL DATA: Restaging lung cancer  EXAM: CT CHEST WITH CONTRAST  TECHNIQUE: Multidetector CT imaging of the chest was performed during intravenous contrast administration.  CONTRAST: 74mL OMNIPAQUE IOHEXOL 300 MG/ML SOLN  COMPARISON: 01/26/2014  FINDINGS: Mediastinum: No pericardial effusion. The heart size is normal. Calcified atherosclerotic plaque involves the thoracic aorta as well as the RCA and LAD coronary  arteries. The trachea is patent and midline. Normal appearance of the esophagus. No mediastinal or hilar adenopathy identified.  Lungs/Pleura: No pleural effusion identified. There is no airspace consolidation or atelectasis. Status post right upper lobectomy. No evidence for locally recurrent tumor. Enlarging nodule within the left lower lobe measures 10 mm previously 7 mm. No additional pulmonary nodules are mass is identified.  Upper Abdomen: Incidental imaging through the upper abdomen shows no acute findings. Partially visualized and potentially complex cyst involving the left kidney measures 1.5 cm, image 56/series 2. Right kidney is unremarkable peer  Musculoskeletal: Review of the visualized bony structures is negative for aggressive lytic or sclerotic bone lesion. No aggressive lytic or sclerotic bone lesions identified.  IMPRESSION: 1. No evidence for locally recurrent tumor status post right upper lobectomy. 2. Pulmonary nodule in the left lower lobe has increased in size from previous exam now measuring 10 mm. 3. Indeterminate and possibly complex cyst within the left kidney is partially visualized. Recommend further evaluation with a renal sonogram. 4. Atherosclerotic disease including multi vessel coronary artery Calcifications.  I have independently reviewed the above radiology studies  and reviewed the findings with the patient.   Electronically Signed  By: Queen Slough.D.  On: 07/27/2014 10:03  Ct Chest W Contrast  01/26/2014   CLINICAL DATA:  History of lung cancer diagnosed in November 2014. Status post right upper lobectomy.  EXAM: CT CHEST WITH CONTRAST  TECHNIQUE: Multidetector CT imaging of the chest was performed during intravenous contrast administration.  CONTRAST:  73mL OMNIPAQUE IOHEXOL 300 MG/ML  SOLN  COMPARISON:  PET-CT 04/30/2013.  Chest CT 03/11/2013.  FINDINGS: Mediastinum: Heart size is enlarged with right atrial and right  ventricular dilatation. There is no significant pericardial fluid, thickening or pericardial calcification. There is atherosclerosis of the thoracic aorta, the great vessels of the mediastinum and the coronary arteries, including calcified atherosclerotic plaque in the left main, left anterior descending, left circumflex and right coronary arteries. No pathologically enlarged mediastinal or hilar lymph nodes. Esophagus is unremarkable in appearance.  Lungs/Pleura: Status post right upper lobectomy. Mild architectural distortion in the right lung indicative of mild postoperative scarring. Compensatory hyperexpansion of the right middle and lower lobes. 3 mm nodule in the medial aspect of the left lower lobe (image 37 of series 5). 4 mm nodule in the left lower lobe (image 31 of series 5), unchanged. No other suspicious appearing pulmonary nodules or masses. Mild diffuse bronchial wall thickening with mild centrilobular emphysema. No acute consolidative airspace disease. No pleural effusions.  Upper Abdomen: Calcified granuloma in the spleen.  Musculoskeletal: 9 mm soft tissue attenuation nodule in the lateral aspect of the right breast (image 26 of series 2), similar to prior examinations, including PET-CT 04/30/2013, at which point this was not hypermetabolic. There are no aggressive appearing lytic or blastic lesions noted in the visualized portions of the skeleton.  IMPRESSION: 1. Status post right upper lobectomy. No findings to suggest local recurrence of disease or metastatic disease in the thorax on today's examination. 2. Tiny nonspecific pulmonary nodules in the left lower lobe, as above. Attention on followup studies is recommended. 3. Cardiomegaly with right atrial and right ventricular dilatation. 4. Atherosclerosis, including left main and 3 vessel coronary artery disease. 5. Additional incidental findings, as above.   Electronically Signed   By: Vinnie Langton M.D.   On: 01/26/2014 10:34     Recent  Labs: Lab Results  Component Value Date   WBC 5.3 07/27/2014   HGB 13.5 07/27/2014   HCT 42.5 07/27/2014   PLT 251 07/27/2014   GLUCOSE 100 07/27/2014   ALT 16 07/27/2014   AST 20 07/27/2014   NA 140 07/27/2014   K 4.2 07/27/2014   CL 98 06/23/2013   CREATININE 0.9 07/27/2014   BUN 10.8 07/27/2014   CO2 24 07/27/2014   INR 1.02 06/12/2013      Assessment / Plan:   Patient doing well following resection of what was clinically Stage II carcinoma the lung, but was found pathologically to be stage IB.  Foundation 1 and Myrid  genetic testing has been completed and is scanned into the patient's chart.  Plan see the patient back in 6 months.    Breck Maryland B 08/26/2014 3:36 PM

## 2014-08-27 NOTE — Progress Notes (Signed)
HPI: Patient presents today for follow up of foot and nail care. [name] was in the hospital for much of 2015 fall for lung cancer and treatment-- she states her last tests were clear for cancer and she did not have to go on chemo.she is feeling better overall.  Objective: Patients chart is reviewed. Neurovascular status unchanged with non palpable pedal pulses left and a below knee amputation right. Patients nails 1-5 left are thickened, discolored, distrophic, friable and brittle with yellow-brown discoloration. Patient subjectively relates they are painful with shoes and with ambulation left foot x 5   Assessment: Symptomatic onychomycosis x 5 / pvd/ previous amputation right , pvd  Plan: Discussed treatment options and alternatives. The symptomatic toenails were carefully debrided through manual an mechanical means without complication. Return appointment recommended at routine intervals of 3 months

## 2014-10-27 ENCOUNTER — Encounter: Payer: Self-pay | Admitting: Family

## 2014-10-28 ENCOUNTER — Ambulatory Visit (INDEPENDENT_AMBULATORY_CARE_PROVIDER_SITE_OTHER)
Admission: RE | Admit: 2014-10-28 | Discharge: 2014-10-28 | Disposition: A | Discharge status: Home / Self Care | Payer: Medicare Other | Source: Ambulatory Visit | Attending: Family | Admitting: Family

## 2014-10-28 ENCOUNTER — Encounter: Payer: Self-pay | Admitting: Family

## 2014-10-28 ENCOUNTER — Ambulatory Visit (INDEPENDENT_AMBULATORY_CARE_PROVIDER_SITE_OTHER): Payer: Medicare Other | Admitting: Family

## 2014-10-28 ENCOUNTER — Other Ambulatory Visit: Payer: Self-pay | Admitting: Family

## 2014-10-28 VITALS — BP 176/77 | HR 63 | Resp 16 | Ht 64.0 in | Wt 155.0 lb

## 2014-10-28 DIAGNOSIS — C3411 Malignant neoplasm of upper lobe, right bronchus or lung: Secondary | ICD-10-CM | POA: Diagnosis present

## 2014-10-28 DIAGNOSIS — I771 Stricture of artery: Secondary | ICD-10-CM | POA: Insufficient documentation

## 2014-10-28 DIAGNOSIS — Z89611 Acquired absence of right leg above knee: Secondary | ICD-10-CM | POA: Diagnosis not present

## 2014-10-28 DIAGNOSIS — Z48812 Encounter for surgical aftercare following surgery on the circulatory system: Secondary | ICD-10-CM

## 2014-10-28 DIAGNOSIS — I739 Peripheral vascular disease, unspecified: Secondary | ICD-10-CM

## 2014-10-28 DIAGNOSIS — I517 Cardiomegaly: Secondary | ICD-10-CM | POA: Diagnosis not present

## 2014-10-28 DIAGNOSIS — R52 Pain, unspecified: Secondary | ICD-10-CM | POA: Insufficient documentation

## 2014-10-28 DIAGNOSIS — R200 Anesthesia of skin: Secondary | ICD-10-CM | POA: Insufficient documentation

## 2014-10-28 DIAGNOSIS — Z9889 Other specified postprocedural states: Secondary | ICD-10-CM

## 2014-10-28 DIAGNOSIS — Z87891 Personal history of nicotine dependence: Secondary | ICD-10-CM

## 2014-10-28 DIAGNOSIS — R0989 Other specified symptoms and signs involving the circulatory and respiratory systems: Secondary | ICD-10-CM

## 2014-10-28 DIAGNOSIS — I2510 Atherosclerotic heart disease of native coronary artery without angina pectoris: Secondary | ICD-10-CM | POA: Insufficient documentation

## 2014-10-28 DIAGNOSIS — J432 Centrilobular emphysema: Secondary | ICD-10-CM | POA: Insufficient documentation

## 2014-10-28 DIAGNOSIS — I723 Aneurysm of iliac artery: Secondary | ICD-10-CM

## 2014-10-28 DIAGNOSIS — Z95828 Presence of other vascular implants and grafts: Secondary | ICD-10-CM

## 2014-10-28 DIAGNOSIS — Z902 Acquired absence of lung [part of]: Secondary | ICD-10-CM | POA: Insufficient documentation

## 2014-10-28 NOTE — Patient Instructions (Signed)

## 2014-10-28 NOTE — Progress Notes (Signed)
Filed Vitals:   10/28/14 1148 10/28/14 1206  BP: 157/79 176/77  Pulse: 62 63  Resp: 16   Height: 5\' 4"  (1.626 m)   Weight: 155 lb (70.308 kg)   SpO2: 99%

## 2014-10-28 NOTE — Progress Notes (Signed)
[facility] HISTORY AND PHYSICAL -PAD  History of Present Illness [name] is a 81 y.o. female patient of Dr. Scot Dock who has undergone previous left common iliac artery stenting in August of 2010. She has known infrainguinal arterial occlusive disease. She is status post previous right above-the-knee amputation and is ambulatory with her prosthesis.  She returns today for follow up.  She does have some pain in the muscle of her left leg when walking what seems to be short distance with her walker.  She also complains of occasional right stump pain.  She denies non healing ulcers on lower extremities.  She had a VAT wedge resection right lung for cancer, November, 2014, states she is undergoing radiation treatments. 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 She sees a podiatrist every 3-4 months.  Patient states her blood pressure at home [phone], is elevated today. Home Health visits her periodically.  She states she was told that she had 3 TIA's, does not recall any symptoms, this was possibly in 2010.  Pt Diabetic: No  Pt smoker: quit in 2011   Pt meds include:  Statin :Yes  Betablocker: No  ASA: No, severe allergic reaction Other anticoagulants/antiplatelets:Plavix   Past Medical History  Diagnosis Date  . Stroke   . Arthritis   . Hypertension   . Hyperlipidemia   . Glaucoma   . Peripheral vascular disease, unspecified     with Claudication  . COPD GOLD I 04/18/2013  . Shortness of breath   . Cancer     rt lung  . Lung cancer 06/07/2013    Social History History  Substance Use Topics  . Smoking status: Former Smoker -- 0.50 packs/day for 60 years    Types: Cigarettes    Quit date: 07/31/2009  . Smokeless tobacco: Never Used  . Alcohol Use: No    Family History Family History  Problem Relation Age of Onset  . Stroke Brother   . Stomach cancer Mother   . Asthma Sister   . Colon cancer Brother      Past Surgical History  Procedure Laterality Date  . Above knee leg amputation      Right  . Angioplasty / stenting iliac  03/22/09    Aortogram-  left common iliac artery  . Hemorrhoid surgery    . Tonsillectomy    . Colonoscopy  Feb. 2013  . Video bronchoscopy Bilateral 04/10/2013    Procedure: VIDEO BRONCHOSCOPY WITH FLUORO;  Surgeon: Tanda Rockers, MD;  Location: [facility];  Service: Cardiopulmonary;  Laterality: Bilateral;  . Video bronchoscopy N/A 06/16/2013    Procedure: VIDEO BRONCHOSCOPY;  Surgeon: [name], MD;  Location: [facility] OR;  Service: Thoracic;  Laterality: N/A;  . Video assisted thoracoscopy (vats)/wedge resection Right 06/16/2013    Procedure: VIDEO ASSISTED THORACOSCOPY (VATS)/WEDGE RESECTION;  Surgeon: [name], MD;  Location: [place] [facility];  Service: Thoracic;  Laterality: Right;    Allergies  Allergen Reactions  . Aspirin Anaphylaxis  . Morphine And Related Other (See Comments)    confusion    Current Outpatient Prescriptions  Medication Sig Dispense Refill  . acetaminophen (TYLENOL) 500 MG tablet Take 500 mg by mouth as needed.    Marland Kitchen albuterol (PROAIR HFA) 108 (90 BASE) MCG/ACT inhaler Inhale 2 puffs into the lungs every 6 (six) hours as needed for wheezing (wheezing).     . cholecalciferol (VITAMIN D) 1000 UNITS tablet Take 1,000 Units by mouth daily.    Marland Kitchen  clopidogrel (PLAVIX) 75 MG tablet Take 75 mg by mouth 3 (three) times a week. Monday, Wednesday, friday    . furosemide (LASIX) 20 MG tablet Take 20 mg by mouth 2 (two) times a week. 1 tablet twice a week on Mon and Thurs as needed for edema.    . hydrALAZINE (APRESOLINE) 25 MG tablet Take 25 mg by mouth 2 (two) times daily.    Marland Kitchen HYDROCODONE-APAP-DIETARY PROD PO Take 5-325 mg by mouth 2 (two) times daily.    Marland Kitchen LOSARTAN POTASSIUM PO Take 50 mg by mouth daily.    . meclizine (ANTIVERT) 25 MG tablet Take 25 mg by mouth 3 (three) times daily as needed for dizziness (dizziness). For dizziness     . omeprazole (PRILOSEC) 20 MG capsule Take 20 mg by mouth daily before breakfast.     . oxyCODONE-acetaminophen (PERCOCET/ROXICET) 5-325 MG per tablet Take 1-2 tablets by mouth every 4 (four) hours as needed for severe pain. 15 tablet 0  . pravastatin (PRAVACHOL) 20 MG tablet Take 20 mg by mouth at bedtime.     . predniSONE (DELTASONE) 10 MG tablet Take 2 tablets (20 mg total) by mouth daily. 10 tablet 0  . RESTASIS 0.05 % ophthalmic emulsion Place 1 drop into both eyes every 12 (twelve) hours.     . Sennosides (SENNA LAX PO) Take 1-2 tablets by mouth daily as needed (constipation). For constipation    . traMADol (ULTRAM) 50 MG tablet Take 50 mg by mouth 2 (two) times daily as needed for moderate pain or severe pain (pain).    . TRAVATAN Z 0.004 % SOLN ophthalmic solution      No current facility-administered medications for this visit.    ROS: See HPI for pertinent positives and negatives.   Physical Examination  Filed Vitals:   10/28/14 1148  BP: 157/79  Pulse: 62  Resp: 16  Height: 5\' 4"  (1.626 m)  Weight: 155 lb (70.308 kg)  SpO2: 99%   Body mass index is 26.59 kg/(m^2).  General: A&O x 3, WDWN,  Gait: with walker  Eyes: PERRLA,  Pulmonary: CTAB, without wheezes , rales or rhonchi  Cardiac: regular Rythm , no murmur detected  Carotid Bruits  Left  Right    Negative  positive  VASCULAR EXAM:  Extremities: left leg without ischemic changes, right AKA with prosthesis in place.  No open wounds.   LE Pulses  LEFT  RIGHT   FEMORAL  not palpable  not palpable   POPLITEAL  not palpable  AKA   POSTERIOR TIBIAL  palpable  AKA   DORSALIS PEDIS  ANTERIOR TIBIAL  not palpable  AKA    Abdomen: soft, NT, no masses palpated  Skin: no rashes, no ulcers. Musculoskeletal: no muscle wasting or atrophy  Neurologic: A&O X 3; Appropriate Affect ; SENSATION: normal; MOTOR FUNCTION: moving all extremities equally. Speech is fluent/normal. Hard of  hearing.          Non-Invasive Vascular Imaging: DATE: 10/28/2014 ABI: RIGHT: AKA. LEFT: 0.52 (04/29/14, 0.57); TBI: 0.45 (0.33) DUPLEX SCAN OF BYPASS: patent left common iliac stent.    ASSESSMENT: [name] is a 81 y.o. female who is s/p left common iliac artery stenting in August of 2010. She has known infrainguinal arterial occlusive disease. She is status post previous right above-the-knee amputation and is ambulatory with her prosthesis.  Today's left iliac artery stent Duplex demonstrates a patent left common iliac stent.  Left ABI has decreased slightly from 0.57 to 0.52 with  decreased walking; pt states she has slacked off from walking but will resume, denies problems with falling, see Plan. ABI indicates continued severe arterial occlusive disease. She has no tissue loss, no problems with right AKA stump, she walks with a walker and her right AKA prosthesis.  Left TBI improved.  Right carotid bruit detected this visit, not detected at her last visit; no carotid Duplex results on file, will obtain carotid Duplex in about 3 weeks.  Face to face time with patient was 25 minutes. Over 50% of this time was spent on counseling and coordination of care.   PLAN:  Graduated walking program.  I discussed in depth with the patient the nature of atherosclerosis, and emphasized the importance of maximal medical management including strict control of blood pressure, blood glucose, and lipid levels, obtaining regular exercise, and continued cessation of smoking.  The patient is aware that without maximal medical management the underlying atherosclerotic disease process will progress, limiting the benefit of any interventions.  Based on the patient's vascular studies and examination, pt will return to clinic in 6 months with ABI's and left iliac artery stent Duplex. Return in 3 weeks for carotid Duplex.  The patient was given information about PAD including signs, symptoms, treatment,  what symptoms should prompt the patient to seek immediate medical care, and risk reduction measures to take.  Clemon Chambers, RN, MSN, FNP-C [facility] Phone: [phone]  Clinic MD: Scot Dock  10/28/2014 12:05 PM

## 2014-10-28 NOTE — Addendum Note (Signed)
Addended by: Dorthula Rue L on: 10/28/2014 02:38 PM   Modules accepted: Orders

## 2014-10-29 ENCOUNTER — Other Ambulatory Visit (HOSPITAL_BASED_OUTPATIENT_CLINIC_OR_DEPARTMENT_OTHER): Payer: Medicare Other

## 2014-10-29 ENCOUNTER — Encounter (HOSPITAL_COMMUNITY): Payer: Self-pay

## 2014-10-29 ENCOUNTER — Ambulatory Visit (HOSPITAL_COMMUNITY)
Admission: RE | Admit: 2014-10-29 | Discharge: 2014-10-29 | Disposition: A | Discharge status: Home / Self Care | Payer: Medicare Other | Source: Ambulatory Visit | Attending: Family | Admitting: Family

## 2014-10-29 DIAGNOSIS — C3411 Malignant neoplasm of upper lobe, right bronchus or lung: Secondary | ICD-10-CM

## 2014-10-29 LAB — COMPREHENSIVE METABOLIC PANEL (CC13)
ALT: 11 U/L (ref 0–55)
AST: 15 U/L (ref 5–34)
Albumin: 3.5 g/dL (ref 3.5–5.0)
Alkaline Phosphatase: 64 U/L (ref 40–150)
Anion Gap: 12 mEq/L — ABNORMAL HIGH (ref 3–11)
BUN: 8.5 mg/dL (ref 7.0–26.0)
CO2: 22 mEq/L (ref 22–29)
Calcium: 9.2 mg/dL (ref 8.4–10.4)
Chloride: 107 mEq/L (ref 98–109)
Creatinine: 0.9 mg/dL (ref 0.6–1.1)
EGFR: 70 mL/min/{1.73_m2} — ABNORMAL LOW (ref >90)
Glucose: 109 mg/dl (ref 70–140)
Potassium: 4 mEq/L (ref 3.5–5.1)
Sodium: 142 mEq/L (ref 136–145)
Total Bilirubin: 0.43 mg/dL (ref 0.20–1.20)
Total Protein: 6.9 g/dL (ref 6.4–8.3)

## 2014-10-29 LAB — CBC WITH DIFFERENTIAL/PLATELET
BASO%: 0.2 % (ref 0.0–2.0)
Basophils Absolute: 0 10*3/uL (ref 0.0–0.1)
EOS%: 1.1 % (ref 0.0–7.0)
Eosinophils Absolute: 0.1 10*3/uL (ref 0.0–0.5)
HCT: 40.8 % (ref 34.8–46.6)
HGB: 13.2 g/dL (ref 11.6–15.9)
LYMPH%: 21.6 % (ref 14.0–49.7)
MCH: 29.2 pg (ref 25.1–34.0)
MCHC: 32.4 g/dL (ref 31.5–36.0)
MCV: 90.3 fL (ref 79.5–101.0)
MONO#: 0.5 10*3/uL (ref 0.1–0.9)
MONO%: 7 % (ref 0.0–14.0)
NEUT#: 4.5 10*3/uL (ref 1.5–6.5)
NEUT%: 70.1 % (ref 38.4–76.8)
Platelets: 246 10*3/uL (ref 145–400)
RBC: 4.52 10*6/uL (ref 3.70–5.45)
RDW: 14.1 % (ref 11.2–14.5)
WBC: 6.5 10*3/uL (ref 3.9–10.3)
lymph#: 1.4 10*3/uL (ref 0.9–3.3)

## 2014-10-29 MED ORDER — IOHEXOL 300 MG/ML  SOLN
80.0000 mL | Freq: Once | INTRAMUSCULAR | Status: AC | PRN
  Administered 2014-10-29: 80 mL via INTRAVENOUS

## 2014-11-03 ENCOUNTER — Encounter: Payer: Self-pay | Admitting: Medical Oncology

## 2014-11-03 ENCOUNTER — Ambulatory Visit (HOSPITAL_BASED_OUTPATIENT_CLINIC_OR_DEPARTMENT_OTHER): Payer: Medicare Other | Admitting: Internal Medicine

## 2014-11-03 ENCOUNTER — Encounter: Payer: Self-pay | Admitting: Internal Medicine

## 2014-11-03 ENCOUNTER — Telehealth: Payer: Self-pay | Admitting: Internal Medicine

## 2014-11-03 VITALS — BP 164/60 | HR 55 | Temp 98.0°F | Resp 17 | Ht 64.0 in | Wt 151.6 lb

## 2014-11-03 DIAGNOSIS — R0989 Other specified symptoms and signs involving the circulatory and respiratory systems: Secondary | ICD-10-CM | POA: Diagnosis not present

## 2014-11-03 DIAGNOSIS — C3411 Malignant neoplasm of upper lobe, right bronchus or lung: Secondary | ICD-10-CM

## 2014-11-03 NOTE — Telephone Encounter (Signed)
s.w. pt and advised on OCT appt....pt ok and aware °

## 2014-11-03 NOTE — Progress Notes (Signed)
[facility] [phone]   [phone]  OFFICE PROGRESS NOTE  Reymundo Poll, MD [phone] [place] [address] [place] [address]  DIAGNOSIS: Stage IB (T2a, N0, M0) non-small cell lung cancer consistent with moderately differentiated adenocarcinoma diagnosed in September of 2014.   PRIOR THERAPY: Bronchoscopy, right video-assisted thoracoscopy, minithoracotomy, right upper lobectomy with lymph node dissection under the care of Dr. Servando Snare on 06/16/2013. Tumor size was 3.5 CM and the myriad genetics showed high risk for recurrence.  CURRENT THERAPY: Observation.  INTERVAL HISTORY: [name] 81 y.o. female returns to the clinic today for follow-up visit. The patient is feeling fine today with no specific complaints except for chest congestion after catching a cold recently. She has mild cough. The patient denied having any significant weight loss or night sweats. She has no chest pain or hemoptysis. The patient denied having any significant fever or chills. She has been observation since her surgery. She had repeat CT scan of the chest performed recently and she is here for evaluation and discussion of her scan results.  MEDICAL HISTORY: Past Medical History  Diagnosis Date  . Stroke   . Arthritis   . Hypertension   . Hyperlipidemia   . Glaucoma   . Peripheral vascular disease, unspecified     with Claudication  . COPD GOLD I 04/18/2013  . Shortness of breath   . Cancer     rt lung  . Lung cancer 06/07/2013    ALLERGIES:  is allergic to aspirin and morphine and related.  MEDICATIONS:  Current Outpatient Prescriptions  Medication Sig Dispense Refill  . acetaminophen (TYLENOL) 500 MG tablet Take 500 mg by mouth as needed.    Marland Kitchen albuterol (PROAIR HFA) 108 (90 BASE) MCG/ACT inhaler Inhale 2 puffs into the lungs every 6 (six) hours as needed for wheezing (wheezing).     . cholecalciferol (VITAMIN D) 1000 UNITS tablet Take 1,000 Units by mouth  daily.    . clopidogrel (PLAVIX) 75 MG tablet Take 75 mg by mouth 3 (three) times a week. Monday, Wednesday, friday    . furosemide (LASIX) 20 MG tablet Take 20 mg by mouth 2 (two) times a week. 1 tablet twice a week on Mon and Thurs as needed for edema.    . hydrALAZINE (APRESOLINE) 25 MG tablet Take 25 mg by mouth 2 (two) times daily.    Marland Kitchen LOSARTAN POTASSIUM PO Take 50 mg by mouth daily.    . meclizine (ANTIVERT) 25 MG tablet Take 25 mg by mouth 3 (three) times daily as needed for dizziness (dizziness). For dizziness    . omeprazole (PRILOSEC) 20 MG capsule Take 20 mg by mouth daily before breakfast.     . oxyCODONE-acetaminophen (PERCOCET/ROXICET) 5-325 MG per tablet Take 1-2 tablets by mouth every 4 (four) hours as needed for severe pain. 15 tablet 0  . pravastatin (PRAVACHOL) 20 MG tablet Take 20 mg by mouth at bedtime.     . predniSONE (DELTASONE) 10 MG tablet Take 2 tablets (20 mg total) by mouth daily. 10 tablet 0  . RESTASIS 0.05 % ophthalmic emulsion Place 1 drop into both eyes every 12 (twelve) hours.     . Sennosides (SENNA LAX PO) Take 1-2 tablets by mouth daily as needed (constipation). For constipation    . traMADol (ULTRAM) 50 MG tablet Take 50 mg by mouth 2 (two) times daily as needed for moderate pain or severe pain (pain).    Dorette Grate  Z 0.004 % SOLN ophthalmic solution     . HYDROCODONE-APAP-DIETARY PROD PO Take 5-325 mg by mouth 2 (two) times daily.     No current facility-administered medications for this visit.    SURGICAL HISTORY:  Past Surgical History  Procedure Laterality Date  . Above knee leg amputation      Right  . Angioplasty / stenting iliac  03/22/09    Aortogram-  left common iliac artery  . Hemorrhoid surgery    . Tonsillectomy    . Colonoscopy  Feb. 2013  . Video bronchoscopy Bilateral 04/10/2013    Procedure: VIDEO BRONCHOSCOPY WITH FLUORO;  Surgeon: Tanda Rockers, MD;  Location: [facility];  Service: Cardiopulmonary;  Laterality: Bilateral;  .  Video bronchoscopy N/A 06/16/2013    Procedure: VIDEO BRONCHOSCOPY;  Surgeon: [name], MD;  Location: [facility];  Service: Thoracic;  Laterality: N/A;  . Video assisted thoracoscopy (vats)/wedge resection Right 06/16/2013    Procedure: VIDEO ASSISTED THORACOSCOPY (VATS)/WEDGE RESECTION;  Surgeon: [name], MD;  Location: [facility];  Service: Thoracic;  Laterality: Right;    REVIEW OF SYSTEMS:  A comprehensive review of systems was negative except for: Respiratory: positive for cough and dyspnea on exertion   PHYSICAL EXAMINATION: General appearance: alert, cooperative, fatigued and no distress Head: Normocephalic, without obvious abnormality, atraumatic Neck: no adenopathy, no JVD, supple, symmetrical, trachea midline and thyroid not enlarged, symmetric, no tenderness/mass/nodules Lymph nodes: Cervical, supraclavicular, and axillary nodes normal. Resp: clear to auscultation bilaterally Back: symmetric, no curvature. ROM normal. No CVA tenderness. Cardio: regular rate and rhythm, S1, S2 normal, no murmur, click, rub or gallop GI: soft, non-tender; bowel sounds normal; no masses,  no organomegaly Extremities: extremities normal, atraumatic, no cyanosis or edema  ECOG PERFORMANCE STATUS: 1 - Symptomatic but completely ambulatory  Blood pressure 164/60, pulse 55, temperature 98 F (36.7 C), temperature source Oral, resp. rate 17, height 5' 4"  (1.626 m), weight 151 lb 9.6 oz (68.765 kg), SpO2 93 %.  LABORATORY DATA: Lab Results  Component Value Date   WBC 6.5 10/29/2014   HGB 13.2 10/29/2014   HCT 40.8 10/29/2014   MCV 90.3 10/29/2014   PLT 246 10/29/2014      Chemistry      Component Value Date/Time   NA 142 10/29/2014 0901   NA 132* 06/23/2013 0415   K 4.0 10/29/2014 0901   K 4.0 06/23/2013 0415   CL 98 06/23/2013 0415   CO2 22 10/29/2014 0901   CO2 24 06/23/2013 0415   BUN 8.5 10/29/2014 0901   BUN 12 06/23/2013 0415   CREATININE 0.9 10/29/2014 0901   CREATININE  0.91 06/23/2013 0415      Component Value Date/Time   CALCIUM 9.2 10/29/2014 0901   CALCIUM 8.4 06/23/2013 0415   ALKPHOS 64 10/29/2014 0901   ALKPHOS 43 06/18/2013 0445   AST 15 10/29/2014 0901   AST 21 06/18/2013 0445   ALT 11 10/29/2014 0901   ALT 11 06/18/2013 0445   BILITOT 0.43 10/29/2014 0901   BILITOT 0.5 06/18/2013 0445       RADIOGRAPHIC STUDIES: Ct Chest W Contrast  10/29/2014   CLINICAL DATA:  Restaging of lung cancer. Partial right lung resection. Diagnosed in 2014. Restaging.  EXAM: CT CHEST WITH CONTRAST  TECHNIQUE: Multidetector CT imaging of the chest was performed during intravenous contrast administration.  CONTRAST:  75m OMNIPAQUE IOHEXOL 300 MG/ML  SOLN  COMPARISON:  07/27/2014  FINDINGS: Mediastinum/Nodes: Mild motion degradation throughout. No supraclavicular adenopathy. Advanced  aortic and branch vessel atherosclerosis. Mild cardiomegaly, with right atrial enlargement. Multivessel coronary artery atherosclerosis. No central pulmonary embolism, on this non-dedicated study. No mediastinal or hilar adenopathy.  Lungs/Pleura: No pleural fluid. Status post right upper lobectomy. Moderate centrilobular emphysema.  Subpleural scarring in the right lower lobe is similar.  An ill-defined left lower lobe pulmonary nodule measures 9 mm on image 28 and is similar to 1.0 cm on the prior.  Upper abdomen: Sub cm right liver lobe low-density lesion is unchanged and favored to represent a cyst. Reflux of contrast into the IVC and hepatic veins. Old granulomatous disease in the spleen. Underdistended proximal stomach. Normal imaged portions of the pancreas, gallbladder, adrenal glands, kidneys. Indeterminate left renal lesion described on the prior exam is not included.  Musculoskeletal: Right-sided thoracotomy changes.  IMPRESSION: 1. Mild motion degradation throughout. 2. Status post right upper lobectomy, without locally recurrent or nodal metastatic disease. 3. Similar 9 mm left lower  lobe pulmonary nodule. Recommend attention on follow-up. 4. Cardiomegaly with advanced atherosclerosis, including within the coronary arteries. Right atrial enlargement with contrast reflux into the IVC and hepatic veins, suggesting elevated right heart pressures.   Electronically Signed   By: Abigail Miyamoto M.D.   On: 10/29/2014 10:40    ASSESSMENT AND PLAN: This is a very pleasant 81 years old African-American female with history of stage IB non-small cell lung cancer, adenocarcinoma status post right upper lobectomy with lymph node dissection and has been observation since November 2014 with no significant evidence for disease recurrence. I discussed the scan results with the patient today. I recommended for her to continue on observation with repeat CT scan of the chest in 6 months. For the chest congestion she was advised to take Claritin or Allegra and to consult with her primary care physician if no improvement. The patient was advised to call immediately if she has any concerning symptoms in the interval. The patient voices understanding of current disease status and treatment options and is in agreement with the current care plan.  All questions were answered. The patient knows to call the clinic with any problems, questions or concerns. We can certainly see the patient much sooner if necessary.  Disclaimer: This note was dictated with voice recognition software. Similar sounding words can inadvertently be transcribed and may not be corrected upon review.

## 2014-11-05 ENCOUNTER — Encounter: Payer: Self-pay | Admitting: Cardiothoracic Surgery

## 2014-11-05 ENCOUNTER — Ambulatory Visit (INDEPENDENT_AMBULATORY_CARE_PROVIDER_SITE_OTHER): Payer: Medicare Other | Admitting: Cardiothoracic Surgery

## 2014-11-05 VITALS — BP 176/68 | HR 60 | Resp 20 | Ht 64.0 in | Wt 151.0 lb

## 2014-11-05 DIAGNOSIS — C3431 Malignant neoplasm of lower lobe, right bronchus or lung: Secondary | ICD-10-CM | POA: Diagnosis not present

## 2014-11-05 DIAGNOSIS — Z9889 Other specified postprocedural states: Secondary | ICD-10-CM

## 2014-11-05 DIAGNOSIS — Z902 Acquired absence of lung [part of]: Secondary | ICD-10-CM

## 2014-11-05 NOTE — Progress Notes (Signed)
[name] Date of Birth: June 23, 1928  Nicole Rockers, MD Reymundo Poll, MD  Chief Complaint:   PostOp Follow Up Visit 06/16/2013  OPERATIVE REPORT  PREOPERATIVE DIAGNOSIS: Right upper lobe lung cancer.  POSTOPERATIVE DIAGNOSIS: Right upper lobe lung cancer.  SURGICAL PROCEDURE: Bronchoscopy, right video-assisted thoracoscopy,  minithoracotomy, right upper lobectomy with lymph node dissection, and  placement of On-Q device.  SURGEON: Lanelle Bal, MD  PATH: Stage IB (pT2a,pN0, cM0) ADENOCARCINOMA WITH FOCAL SARCOMATOUS DIFFERENTIATION  History of Present Illness:      Patient returns today 18 months  postop visit, considering her age of 45 years and above-the-knee amputation he is doing extremely well postoperatively. She is very motivated and is returning to near-normal activities. She denies been limited by shortness of breath has had no hemoptysis, recent CT scan of the chest was ordered by Dr. Inda Merlin. She is being evaluated for left shoulder pain   History  Smoking status  . Former Smoker -- 0.50 packs/day for 60 years  . Types: Cigarettes  . Quit date: 07/31/2009  Smokeless tobacco  . Never Used       Allergies  Allergen Reactions  . Aspirin Anaphylaxis  . Morphine And Related Other (See Comments)    confusion    Current Outpatient Prescriptions  Medication Sig Dispense Refill  . furosemide (LASIX) 20 MG tablet Take 20 mg by mouth 2 (two) times a week. 1 tablet twice a week on Mon and Thurs as needed for edema.    Marland Kitchen acetaminophen (TYLENOL) 500 MG tablet Take 500 mg by mouth as needed.    Marland Kitchen albuterol (PROAIR HFA) 108 (90 BASE) MCG/ACT inhaler Inhale 2 puffs into the lungs every 6 (six) hours as needed for wheezing (wheezing).     . cholecalciferol (VITAMIN D) 1000 UNITS tablet Take 1,000 Units by mouth daily.    . clopidogrel (PLAVIX) 75 MG tablet Take 75 mg by mouth 3 (three) times a week. Monday,  Wednesday, friday    . LOSARTAN POTASSIUM PO Take 50 mg by mouth daily.    . meclizine (ANTIVERT) 25 MG tablet Take 25 mg by mouth 3 (three) times daily as needed for dizziness (dizziness). For dizziness    . omeprazole (PRILOSEC) 20 MG capsule Take 20 mg by mouth daily before breakfast.     . oxyCODONE-acetaminophen (PERCOCET/ROXICET) 5-325 MG per tablet Take 1-2 tablets by mouth every 4 (four) hours as needed for severe pain. 15 tablet 0  . pravastatin (PRAVACHOL) 20 MG tablet Take 20 mg by mouth at bedtime.     . RESTASIS 0.05 % ophthalmic emulsion Place 1 drop into both eyes every 12 (twelve) hours.     . Sennosides (SENNA LAX PO) Take 1-2 tablets by mouth daily as needed (constipation). For constipation    . traMADol (ULTRAM) 50 MG tablet Take 50 mg by mouth 2 (two) times daily as needed for moderate pain or severe pain (pain).    . TRAVATAN Z 0.004 % SOLN ophthalmic solution      No current facility-administered medications for this visit.       Physical Exam: BP 176/68 mmHg  Pulse 60  Resp 20  Ht 5\' 4"  (1.626 m)  Wt 151 lb (68.493 kg)  BMI 25.91 kg/m2  SpO2 94%  General appearance: alert, cooperative and appears stated age Neurologic: intact Heart: regular rate and rhythm, S1, S2 normal,  no murmur, click, rub or gallop Lungs: clear to auscultation bilaterally Abdomen: soft, non-tender; bowel sounds normal; no masses,  no organomegaly Extremities: No evidence of DVT in her leg, her prosthesis is in place Wound: Port sites an incision right chest are well-healed I do not appreciate cervical or supraclavicular adenopathy  Diagnostic Studies & Laboratory data:         Recent Radiology Findings: Ct Chest W Contrast  10/29/2014   CLINICAL DATA:  Restaging of lung cancer. Partial right lung resection. Diagnosed in 2014. Restaging.  EXAM: CT CHEST WITH CONTRAST  TECHNIQUE: Multidetector CT imaging of the chest was performed during intravenous contrast administration.  CONTRAST:   11mL OMNIPAQUE IOHEXOL 300 MG/ML  SOLN  COMPARISON:  07/27/2014  FINDINGS: Mediastinum/Nodes: Mild motion degradation throughout. No supraclavicular adenopathy. Advanced aortic and branch vessel atherosclerosis. Mild cardiomegaly, with right atrial enlargement. Multivessel coronary artery atherosclerosis. No central pulmonary embolism, on this non-dedicated study. No mediastinal or hilar adenopathy.  Lungs/Pleura: No pleural fluid. Status post right upper lobectomy. Moderate centrilobular emphysema.  Subpleural scarring in the right lower lobe is similar.  An ill-defined left lower lobe pulmonary nodule measures 9 mm on image 28 and is similar to 1.0 cm on the prior.  Upper abdomen: Sub cm right liver lobe low-density lesion is unchanged and favored to represent a cyst. Reflux of contrast into the IVC and hepatic veins. Old granulomatous disease in the spleen. Underdistended proximal stomach. Normal imaged portions of the pancreas, gallbladder, adrenal glands, kidneys. Indeterminate left renal lesion described on the prior exam is not included.  Musculoskeletal: Right-sided thoracotomy changes.  IMPRESSION: 1. Mild motion degradation throughout. 2. Status post right upper lobectomy, without locally recurrent or nodal metastatic disease. 3. Similar 9 mm left lower lobe pulmonary nodule. Recommend attention on follow-up. 4. Cardiomegaly with advanced atherosclerosis, including within the coronary arteries. Right atrial enlargement with contrast reflux into the IVC and hepatic veins, suggesting elevated right heart pressures.   Electronically Signed   By: Abigail Miyamoto M.D.   On: 10/29/2014 10:40   CLINICAL DATA: Restaging lung cancer  EXAM: CT CHEST WITH CONTRAST  TECHNIQUE: Multidetector CT imaging of the chest was performed during intravenous contrast administration.  CONTRAST: 69mL OMNIPAQUE IOHEXOL 300 MG/ML SOLN  COMPARISON: 01/26/2014  FINDINGS: Mediastinum: No pericardial effusion. The  heart size is normal. Calcified atherosclerotic plaque involves the thoracic aorta as well as the RCA and LAD coronary arteries. The trachea is patent and midline. Normal appearance of the esophagus. No mediastinal or hilar adenopathy identified.  Lungs/Pleura: No pleural effusion identified. There is no airspace consolidation or atelectasis. Status post right upper lobectomy. No evidence for locally recurrent tumor. Enlarging nodule within the left lower lobe measures 10 mm previously 7 mm. No additional pulmonary nodules are mass is identified.  Upper Abdomen: Incidental imaging through the upper abdomen shows no acute findings. Partially visualized and potentially complex cyst involving the left kidney measures 1.5 cm, image 56/series 2. Right kidney is unremarkable peer  Musculoskeletal: Review of the visualized bony structures is negative for aggressive lytic or sclerotic bone lesion. No aggressive lytic or sclerotic bone lesions identified.  IMPRESSION: 1. No evidence for locally recurrent tumor status post right upper lobectomy. 2. Pulmonary nodule in the left lower lobe has increased in size from previous exam now measuring 10 mm. 3. Indeterminate and possibly complex cyst within the left kidney is partially visualized. Recommend further evaluation with a renal sonogram. 4. Atherosclerotic disease including multi vessel coronary artery Calcifications.  I have independently reviewed the above radiology studies  and reviewed the findings with the patient.   Electronically Signed  By: Kerby Moors M.D.  On: 07/27/2014 10:03  Ct Chest W Contrast  01/26/2014   CLINICAL DATA:  History of lung cancer diagnosed in November 2014. Status post right upper lobectomy.  EXAM: CT CHEST WITH CONTRAST  TECHNIQUE: Multidetector CT imaging of the chest was performed during intravenous contrast administration.  CONTRAST:  19mL OMNIPAQUE IOHEXOL 300 MG/ML  SOLN  COMPARISON:   PET-CT 04/30/2013.  Chest CT 03/11/2013.  FINDINGS: Mediastinum: Heart size is enlarged with right atrial and right ventricular dilatation. There is no significant pericardial fluid, thickening or pericardial calcification. There is atherosclerosis of the thoracic aorta, the great vessels of the mediastinum and the coronary arteries, including calcified atherosclerotic plaque in the left main, left anterior descending, left circumflex and right coronary arteries. No pathologically enlarged mediastinal or hilar lymph nodes. Esophagus is unremarkable in appearance.  Lungs/Pleura: Status post right upper lobectomy. Mild architectural distortion in the right lung indicative of mild postoperative scarring. Compensatory hyperexpansion of the right middle and lower lobes. 3 mm nodule in the medial aspect of the left lower lobe (image 37 of series 5). 4 mm nodule in the left lower lobe (image 31 of series 5), unchanged. No other suspicious appearing pulmonary nodules or masses. Mild diffuse bronchial wall thickening with mild centrilobular emphysema. No acute consolidative airspace disease. No pleural effusions.  Upper Abdomen: Calcified granuloma in the spleen.  Musculoskeletal: 9 mm soft tissue attenuation nodule in the lateral aspect of the right breast (image 26 of series 2), similar to prior examinations, including PET-CT 04/30/2013, at which point this was not hypermetabolic. There are no aggressive appearing lytic or blastic lesions noted in the visualized portions of the skeleton.  IMPRESSION: 1. Status post right upper lobectomy. No findings to suggest local recurrence of disease or metastatic disease in the thorax on today's examination. 2. Tiny nonspecific pulmonary nodules in the left lower lobe, as above. Attention on followup studies is recommended. 3. Cardiomegaly with right atrial and right ventricular dilatation. 4. Atherosclerosis, including left main and 3 vessel coronary artery disease. 5. Additional  incidental findings, as above.   Electronically Signed   By: Vinnie Langton M.D.   On: 01/26/2014 10:34     Recent Labs: Lab Results  Component Value Date   WBC 6.5 10/29/2014   HGB 13.2 10/29/2014   HCT 40.8 10/29/2014   PLT 246 10/29/2014   GLUCOSE 109 10/29/2014   ALT 11 10/29/2014   AST 15 10/29/2014   NA 142 10/29/2014   K 4.0 10/29/2014   CL 98 06/23/2013   CREATININE 0.9 10/29/2014   BUN 8.5 10/29/2014   CO2 22 10/29/2014   INR 1.02 06/12/2013      Assessment / Plan:   Patient doing well following resection of what was clinically Stage II carcinoma the lung, but was found pathologically to be stage IB.  Foundation 1 and Myrid  genetic testing has been completed and is scanned into the patient's chart. Cardiomegaly with advanced atherosclerosis, including within the coronary arteries, noted on ct scan Hypertensive No evidence of recurrence on recent ct  Plan see the patient back in 6 months.   [name] 11/05/2014 10:02 AM

## 2014-11-10 IMAGING — CT CT CHEST W/ CM
2 of 3 series · 15 of 36 positions shown, 18 images · IV contrast (omnipaque)
Comparison: Chest radiographs 03/05/2013 and 02/15/2012.

CLINICAL DATA: Enlarging right apical mass on recent chest
radiographs.  Cough with night sweats and hoarseness.

CT CHEST WITH CONTRAST
TECHNIQUE: Multidetector CT imaging of the chest was performed
following the standard protocol during bolus administration of
intravenous contrast.
Contrast: 100mL OMNIPAQUE IOHEXOL 300 MG/ML  SOLN

[Series 2: thorax 5.0 i31f 1 · axial · 0.59mm/px · z∈[+1152,+1392]mm · 12 of 58 slices shown, 15 images]
[im 5/58  mediastinal]
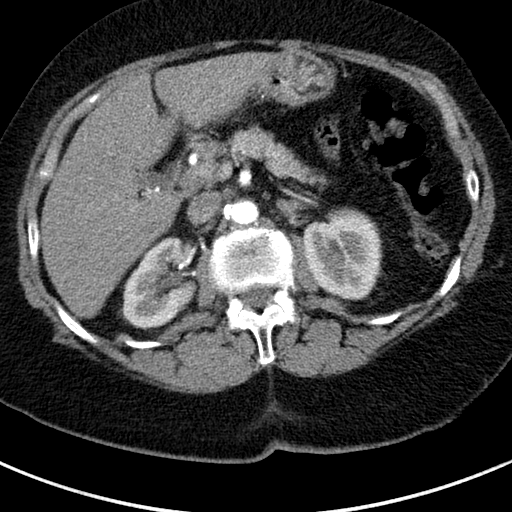
[im 5/58  lung]
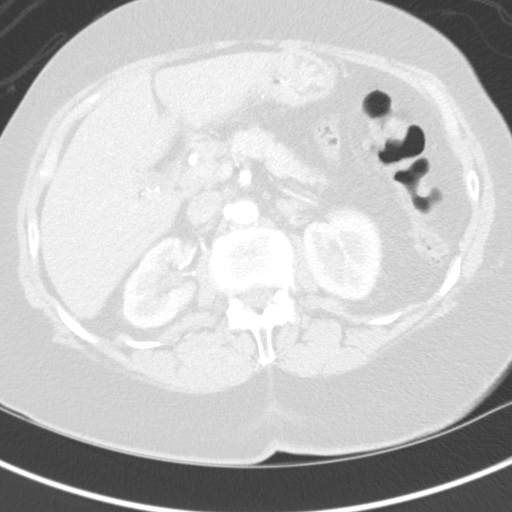
[im 9/58  lung]
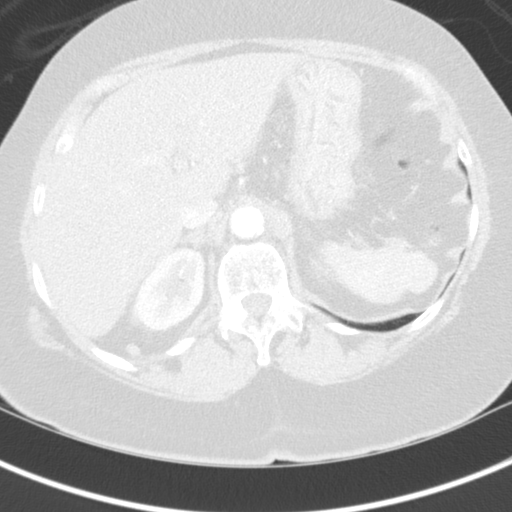
[im 13/58  lung]
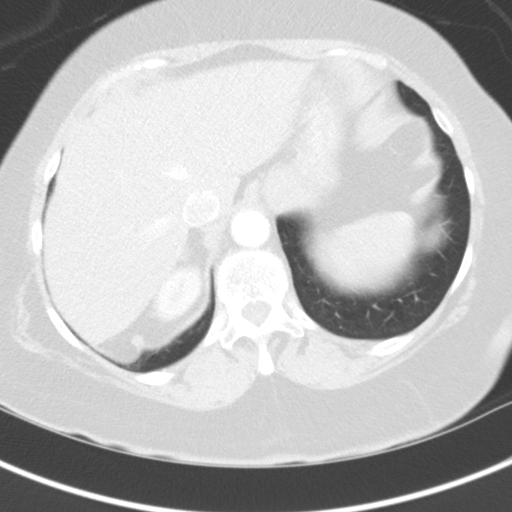
[im 17/58  lung]
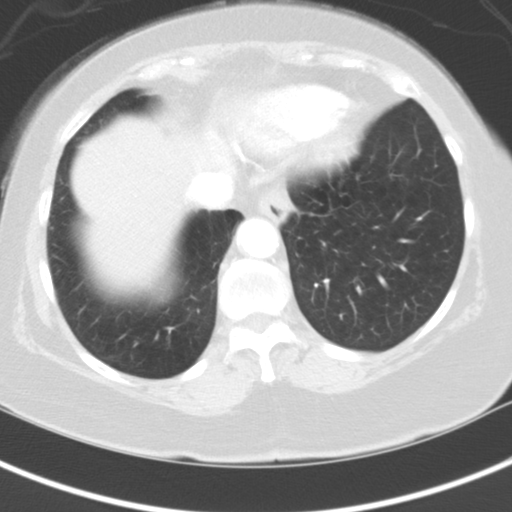
[im 22/58  mediastinal]
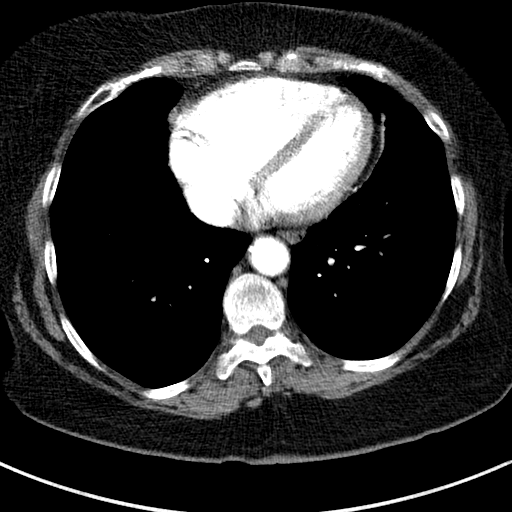
[im 22/58  lung]
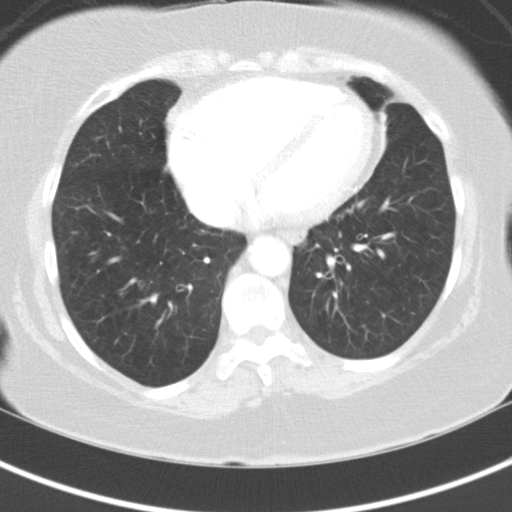
[im 26/58  lung]
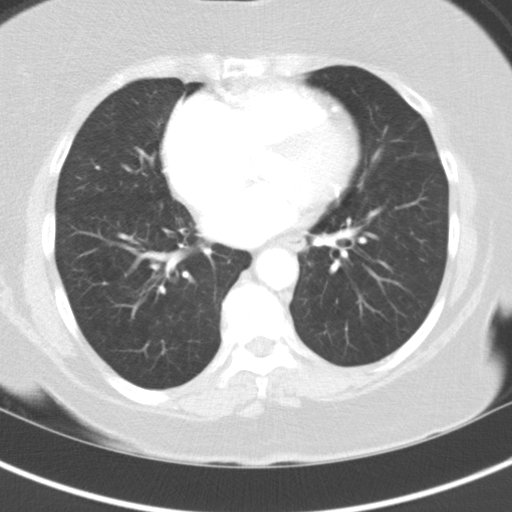
[im 32/58  lung]
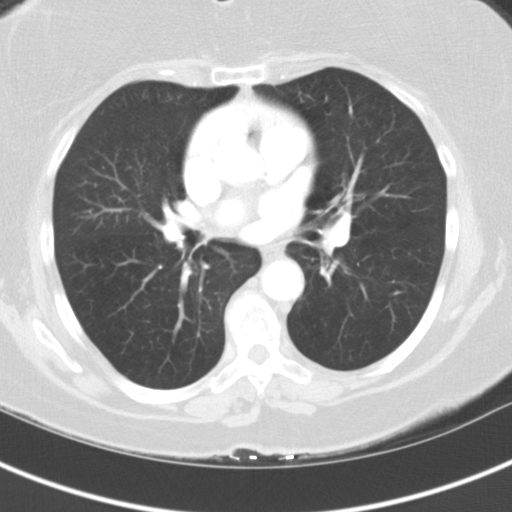
[im 36/58  lung]
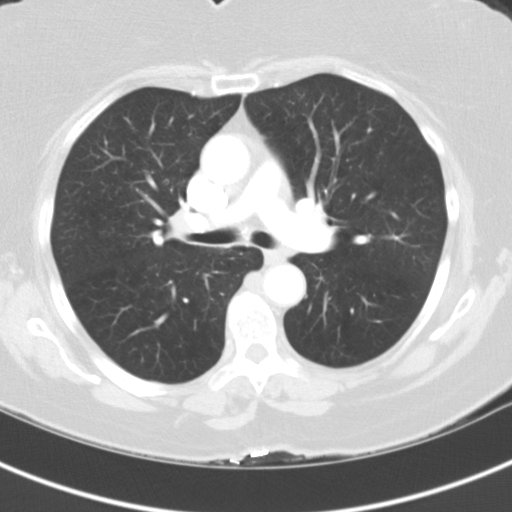
[im 41/58  mediastinal]
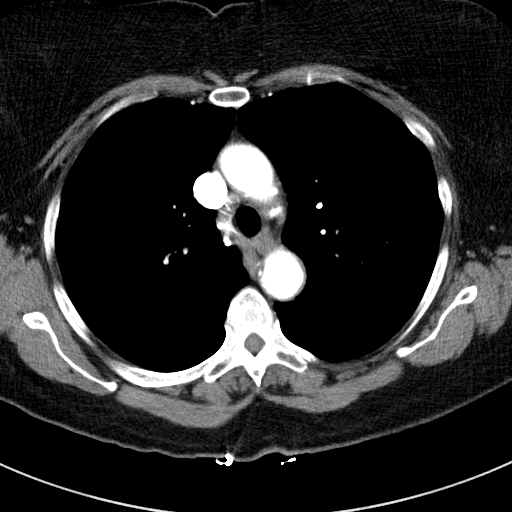
[im 41/58  lung]
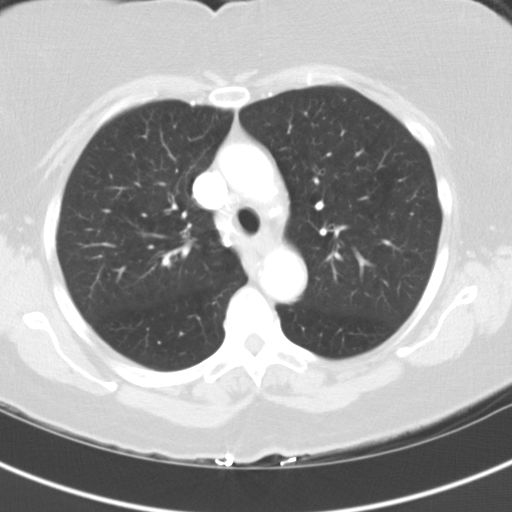
[im 45/58  lung]
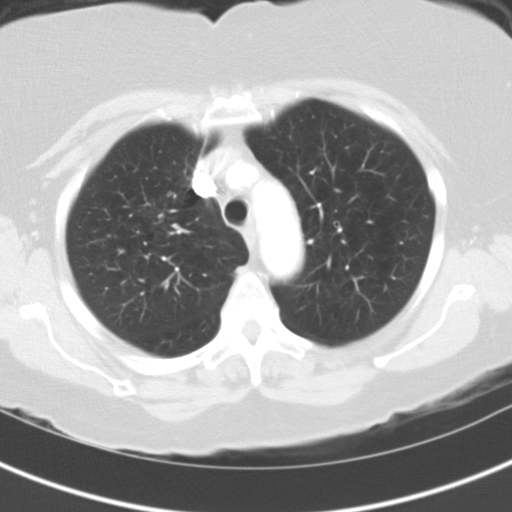
[im 49/58  lung]
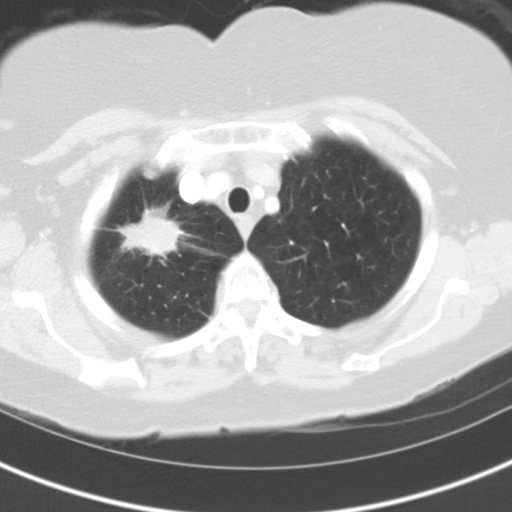
[im 53/58  lung]
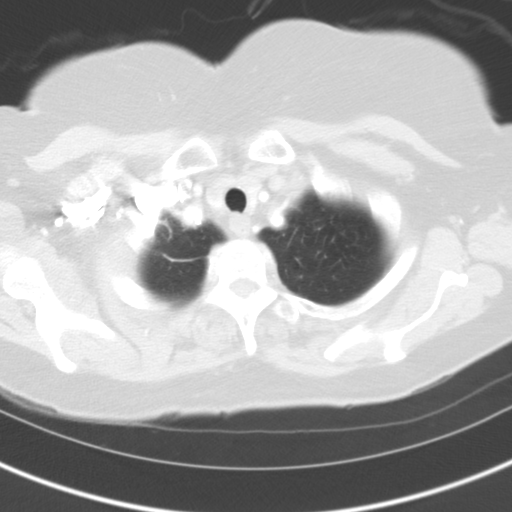

[Series 5: coronal · coronal · 0.56mm/px · 3 of 91 slices shown]
[im 19/91  lung]
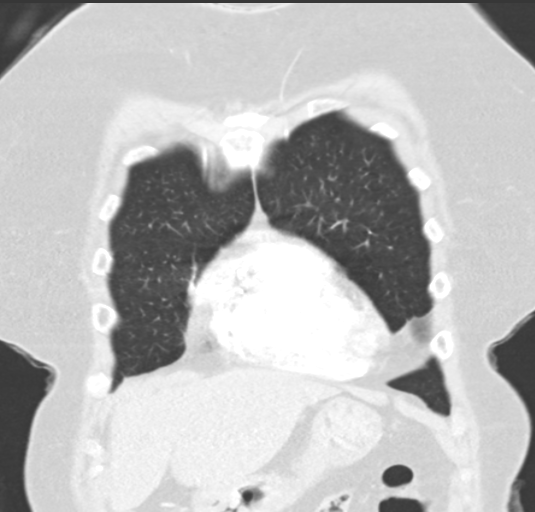
[im 37/91  lung]
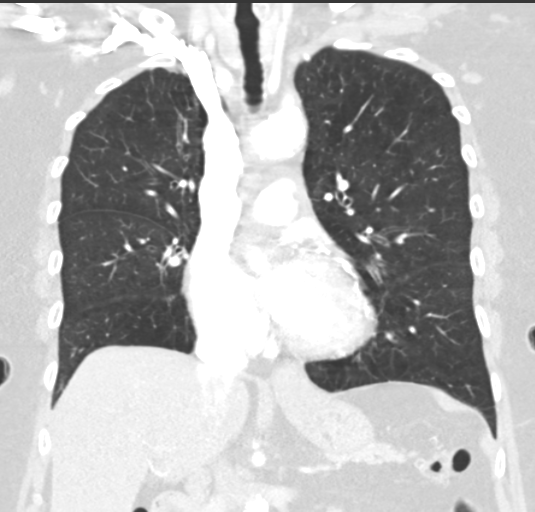
[im 55/91  lung]
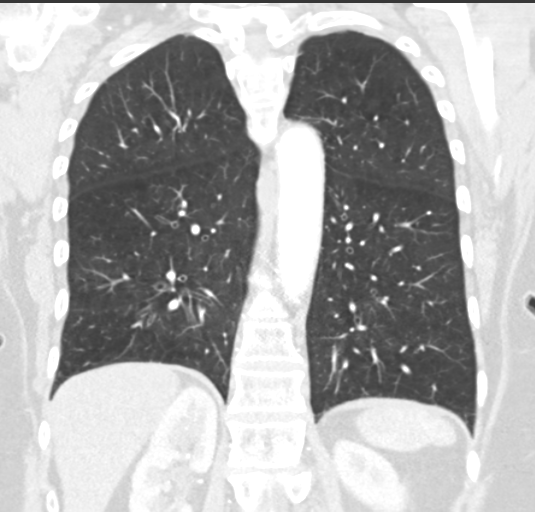

[15 of 36 positions shown; findings below may reference images not displayed]

FINDINGS: Corresponding with the radiographic finding is a
spiculated right apical mass measuring 3.6 x 2.9 x 2.0 cm
consistent with bronchogenic carcinoma.  This lesion demonstrates
no chest wall involvement.  No other suspicious pulmonary nodules
demonstrated.  There is mild centrilobular emphysema.

Small AP window and right hilar lymph nodes are not pathologically
enlarged.  There is no pleural or pericardial effusion.

There is mild atherosclerosis of the aorta, great vessels and
coronary arteries.  No suspicious findings are present within the
upper abdomen.  There are small calcified splenic granulomas.
There is mild reflux of contrasted blood into the IVC and hepatic
veins.  There are no worrisome osseous findings.
IMPRESSION: 1.  Spiculated right apical mass consistent with bronchogenic
carcinoma. Tissue sampling recommended.
2.  No evidence of metastatic disease.
3.  Mild centrilobular emphysema.

## 2014-11-11 ENCOUNTER — Ambulatory Visit (HOSPITAL_COMMUNITY)
Admission: RE | Admit: 2014-11-11 | Discharge: 2014-11-11 | Disposition: A | Discharge status: Home / Self Care | Payer: Medicare Other | Source: Ambulatory Visit | Attending: Vascular Surgery | Admitting: Vascular Surgery

## 2014-11-11 DIAGNOSIS — Z89611 Acquired absence of right leg above knee: Secondary | ICD-10-CM | POA: Diagnosis not present

## 2014-11-11 DIAGNOSIS — Z95828 Presence of other vascular implants and grafts: Secondary | ICD-10-CM

## 2014-11-11 DIAGNOSIS — I739 Peripheral vascular disease, unspecified: Secondary | ICD-10-CM | POA: Diagnosis not present

## 2014-11-11 DIAGNOSIS — I6523 Occlusion and stenosis of bilateral carotid arteries: Secondary | ICD-10-CM | POA: Insufficient documentation

## 2014-11-11 DIAGNOSIS — R0989 Other specified symptoms and signs involving the circulatory and respiratory systems: Secondary | ICD-10-CM | POA: Diagnosis present

## 2014-11-11 DIAGNOSIS — Z87891 Personal history of nicotine dependence: Secondary | ICD-10-CM | POA: Diagnosis not present

## 2014-11-11 DIAGNOSIS — Z9889 Other specified postprocedural states: Secondary | ICD-10-CM | POA: Diagnosis not present

## 2014-11-12 ENCOUNTER — Telehealth: Payer: Self-pay | Admitting: *Deleted

## 2014-11-12 NOTE — Telephone Encounter (Signed)
-----   Message from Viann Fish, NP sent at 11/11/2014  6:59 PM EDT ----- Regarding: call pt with results please Zigmund Daniel, I have not yet generated a phone call encounter. Would you mind calling this pt and letting her know that her carotid Duplex was OK? She had a carotid bruit when I saw her recently for PAD and she came in today for a carotid Duplex. But she has minimal plaque in her carotid arteries which is NOT potential for stroke. She should return as scheduled for her PAD which is about September or October. Thank you, Vinnie Level

## 2014-11-12 NOTE — Telephone Encounter (Signed)
Left message for patient for the information below regarding her carotid duplex.

## 2014-11-19 ENCOUNTER — Ambulatory Visit (INDEPENDENT_AMBULATORY_CARE_PROVIDER_SITE_OTHER): Payer: Medicare Other

## 2014-11-19 DIAGNOSIS — M79676 Pain in unspecified toe(s): Secondary | ICD-10-CM

## 2014-11-19 DIAGNOSIS — M79673 Pain in unspecified foot: Secondary | ICD-10-CM

## 2014-11-19 DIAGNOSIS — B351 Tinea unguium: Secondary | ICD-10-CM

## 2014-11-19 NOTE — Progress Notes (Signed)
Presents today chief complaint of painful elongated toenails.  Objective: Pulses are palpable bilateral nails are thick, yellow dystrophic onychomycosis and painful palpation.   Assessment: Onychomycosis with pain in limb.  Plan: Treatment of nails in thickness and length as covered service secondary to pain.  

## 2014-12-31 ENCOUNTER — Other Ambulatory Visit: Payer: Self-pay

## 2014-12-31 DIAGNOSIS — Z1231 Encounter for screening mammogram for malignant neoplasm of breast: Secondary | ICD-10-CM

## 2015-01-06 ENCOUNTER — Other Ambulatory Visit: Payer: Self-pay

## 2015-01-07 LAB — CYTOLOGY - PAP

## 2015-01-28 IMAGING — CT CT BIOPSY
1 of 2 series · 13 of 32 positions shown, 19 images · non-contrast
Comparison: none

CLINICAL DATA: 3.4 CM RIGHT UPPER LOBE SPICULATED MASS

[Series 2: i-spiral 5.0 b40f · axial · 0.90mm/px · z∈[-107,-34]mm · 13 of 25 slices shown, 19 images]
[im 2/25  soft-tissue]
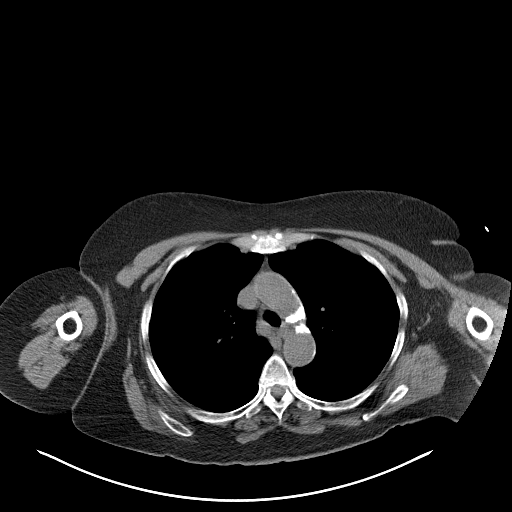
[im 2/25  bone]
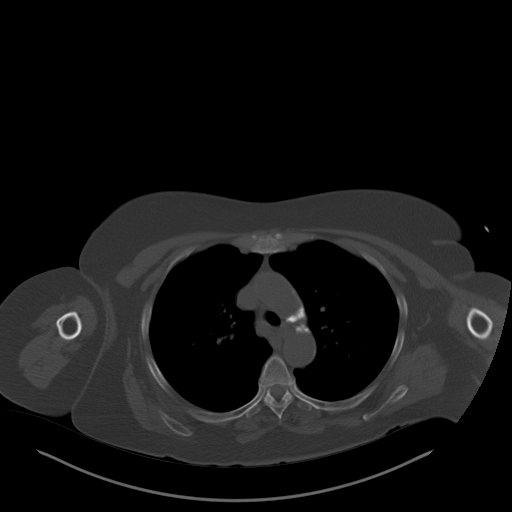
[im 4/25  soft-tissue]
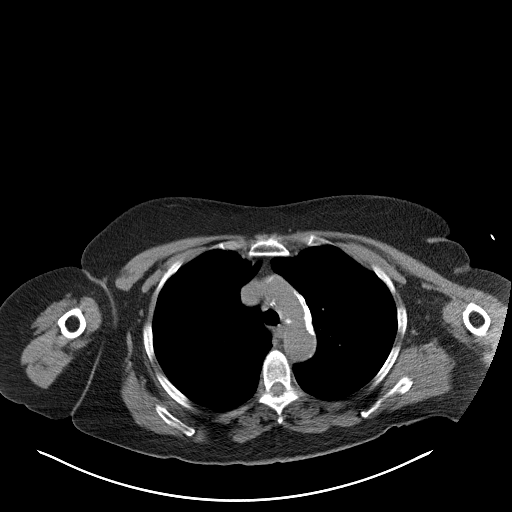
[im 6/25  soft-tissue]
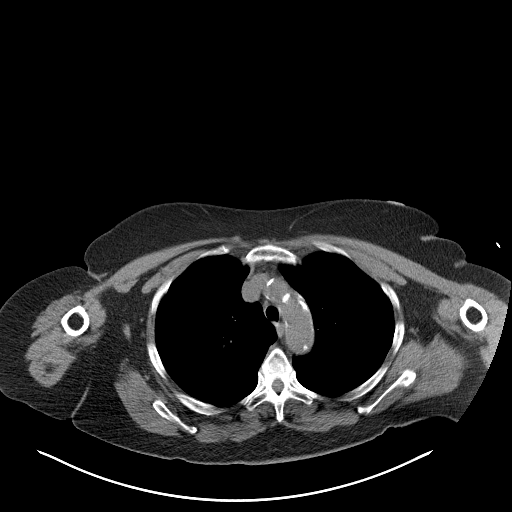
[im 7/25  soft-tissue]
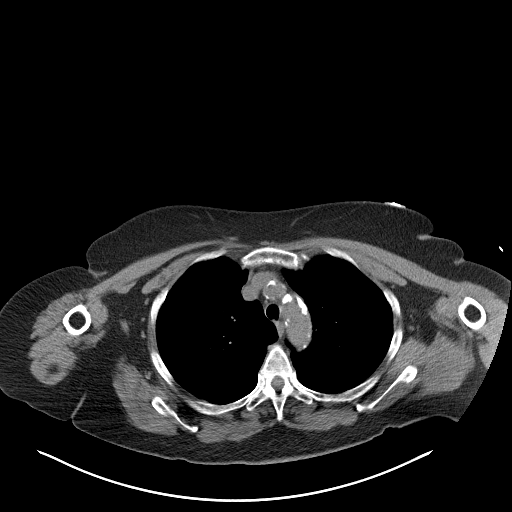
[im 9/25  soft-tissue]
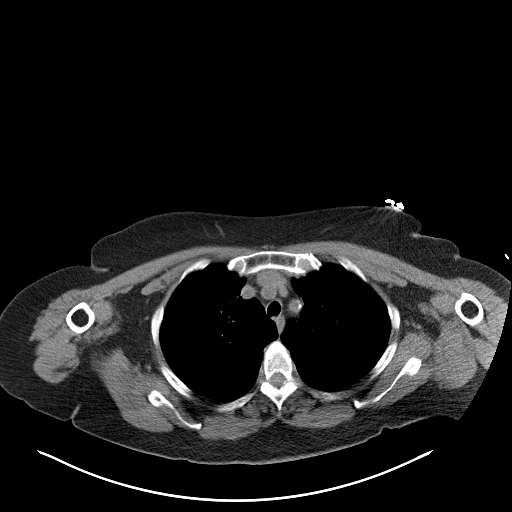
[im 11/25  soft-tissue]
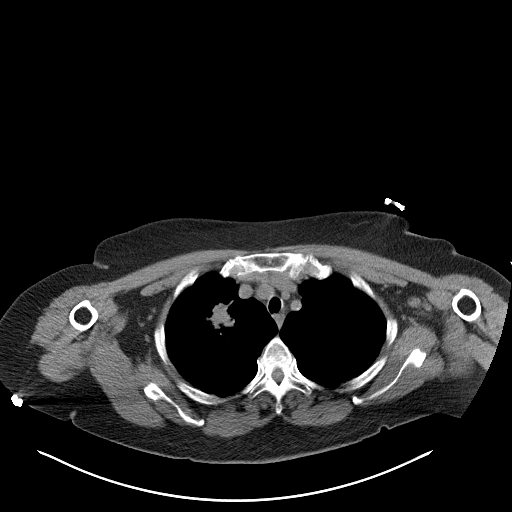
[im 13/25  soft-tissue]
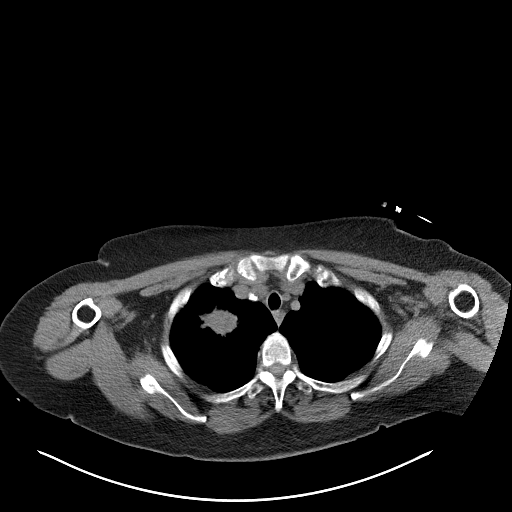
[im 14/25  soft-tissue]
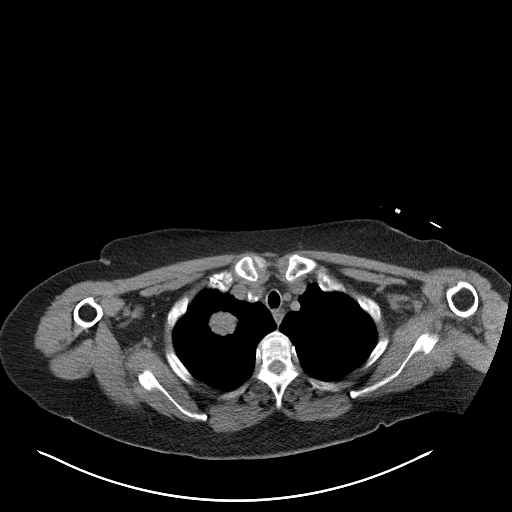
[im 16/25  soft-tissue]
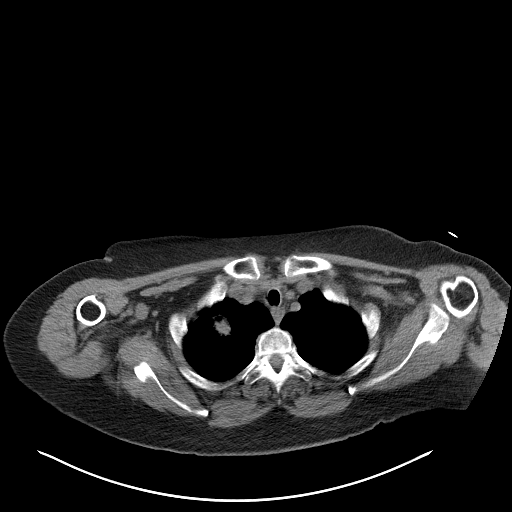
[im 16/25  bone]
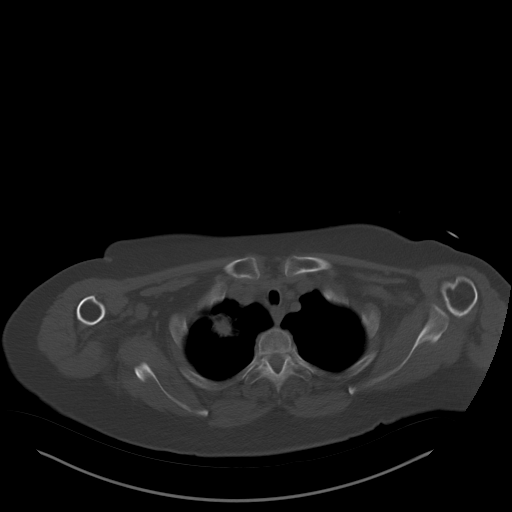
[im 18/25  soft-tissue]
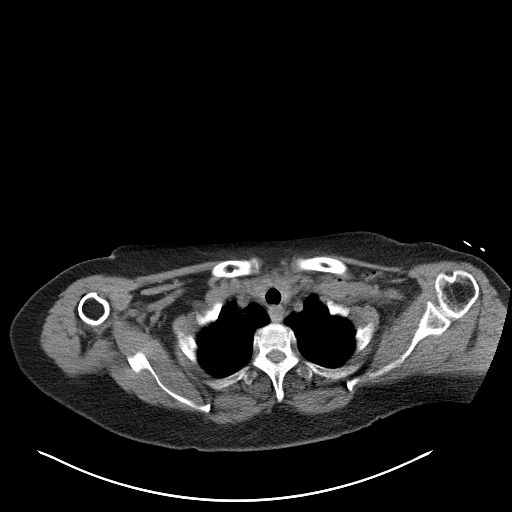
[im 18/25  lung]
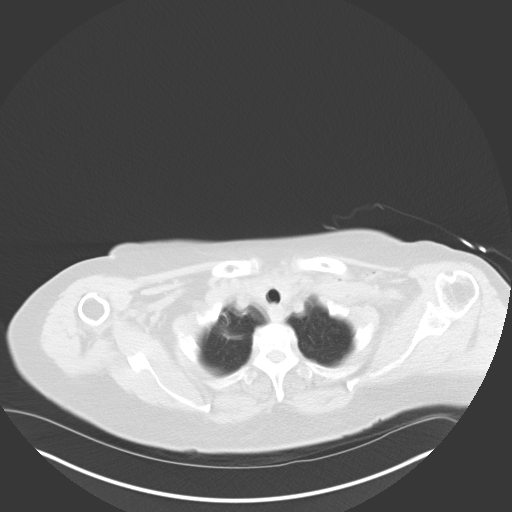
[im 19/25  soft-tissue]
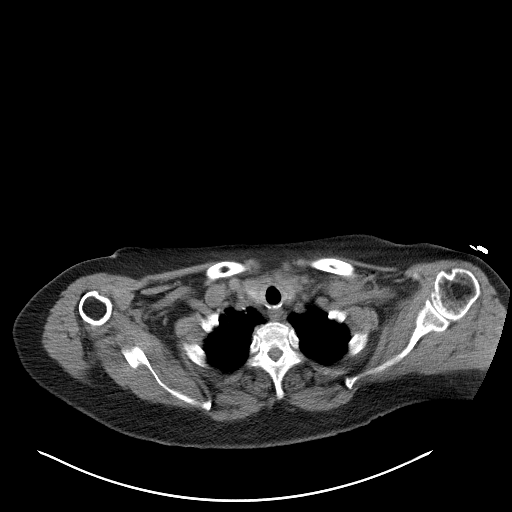
[im 19/25  lung]
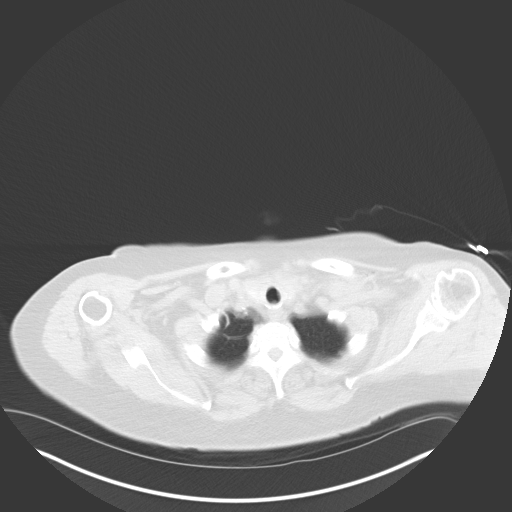
[im 21/25  soft-tissue]
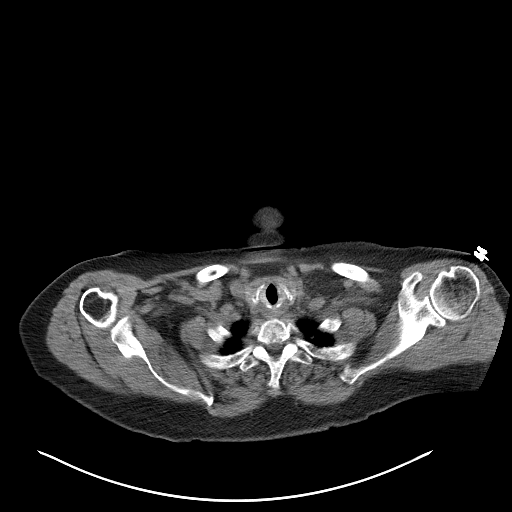
[im 21/25  lung]
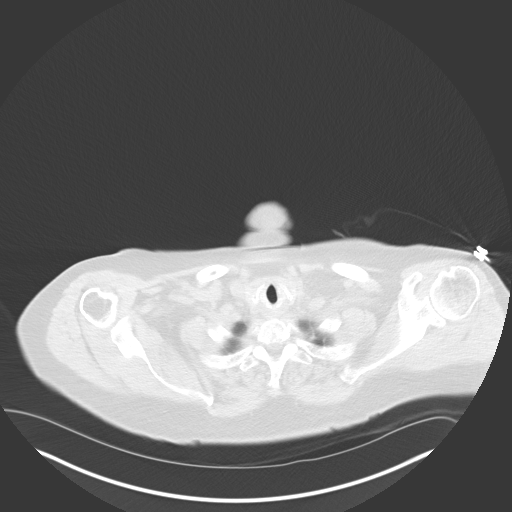
[im 23/25  soft-tissue]
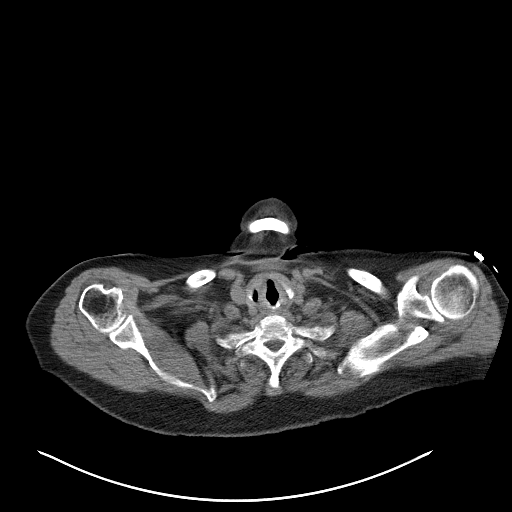
[im 23/25  lung]
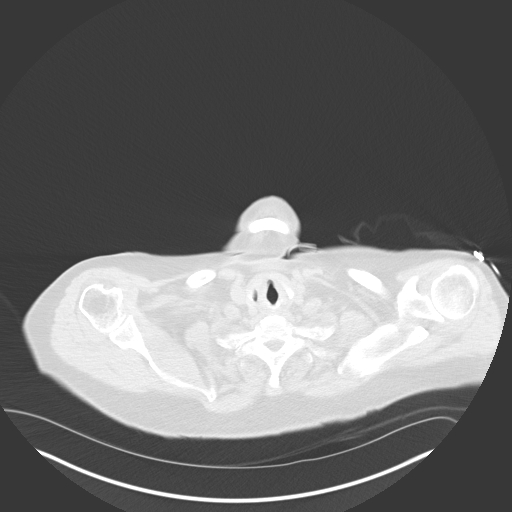

[13 of 32 positions shown; findings below may reference images not displayed]

EXAM:
CT GUIDED NEEDLE CORE BIOPSY OF RIGHT UPPER LOBE MASS

ANESTHESIA/SEDATION:
2  mg IV Versed; 75 mcg IV Fentanyl

Total Moderate Sedation Time: 15 minutes.

PROCEDURE:
The procedure risks, benefits, and alternatives were explained to
the patient. Questions regarding the procedure were encouraged and
answered. The patient understands and consents to the procedure.

The right anterior chest was prepped with betadinein a sterile
fashion, and a sterile drape was applied covering the operative
field. A sterile gown and sterile gloves were used for the
procedure. Local anesthesia was provided with 1% Lidocaine.

Patient was positioned supine. Noncontrast localization CT
performed. Right upper lobe anterior mass was localized. Under
sterile conditions and local anesthesia, CT guidance was utilized to
advance a 17 gauge 11.8 cm access needle from an anterior approach.
Needle position confirmed within the lesion by CT. 18 gauge core
biopsies obtained and placed in formalin. No immediate complication.
Needle tract was embolized with a blood patch. No effusion or
pneumothorax following needle removal. Patient tolerated the biopsy
well.

Complications: None immediate
FINDINGS: CT imaging confirms needle placed in in the anterior right upper
lobe mass.
IMPRESSION: Successful CT-guided right upper lobe mass 18 gauge core biopsy

## 2015-02-11 IMAGING — CR DG CHEST 2V
2 series · 2 of 2 positions shown · non-contrast
Comparison: 05/29/2013

CLINICAL DATA: Preoperative evaluation for right lung surgery

EXAM:
CHEST  2 VIEW

[w chest pa]
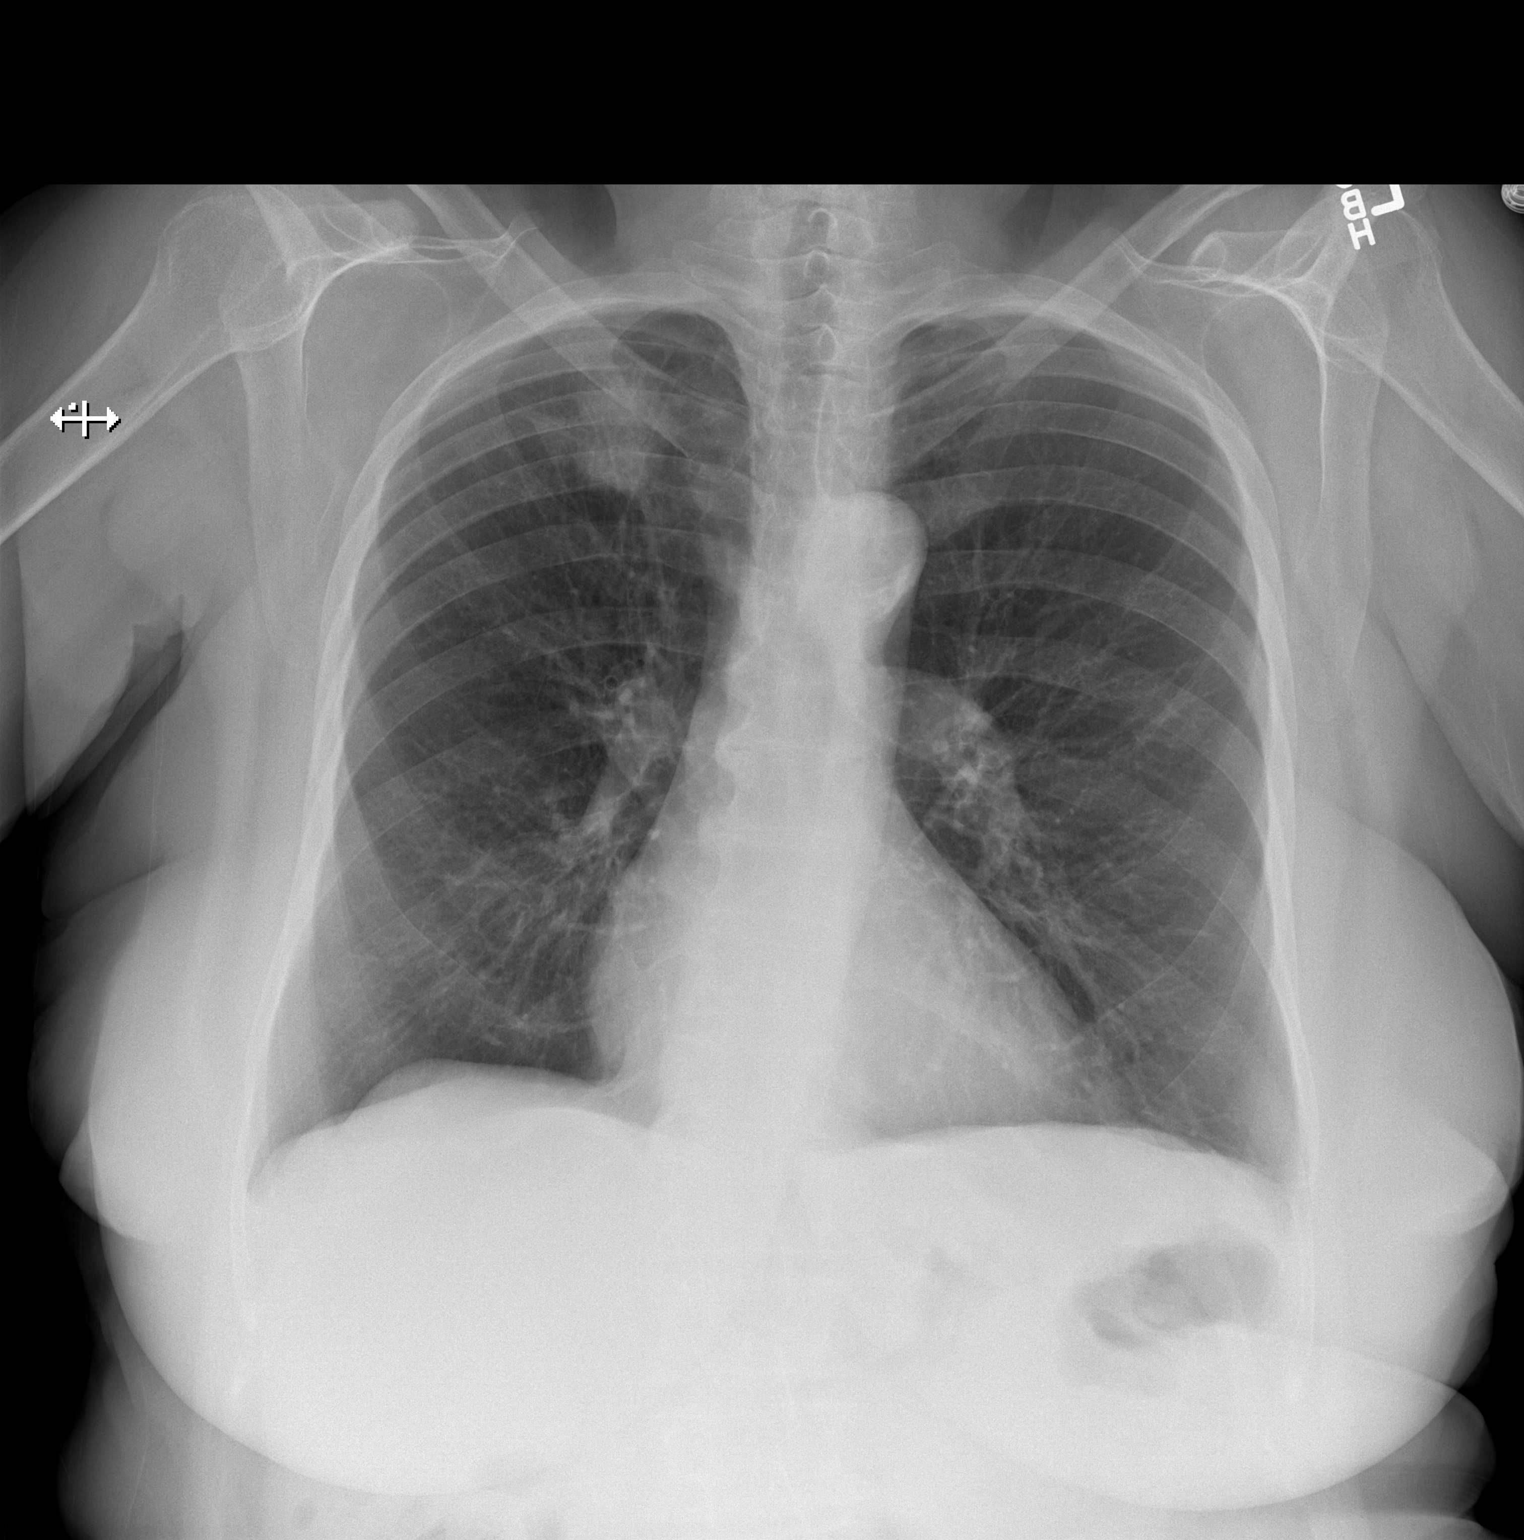

[w chest lat]
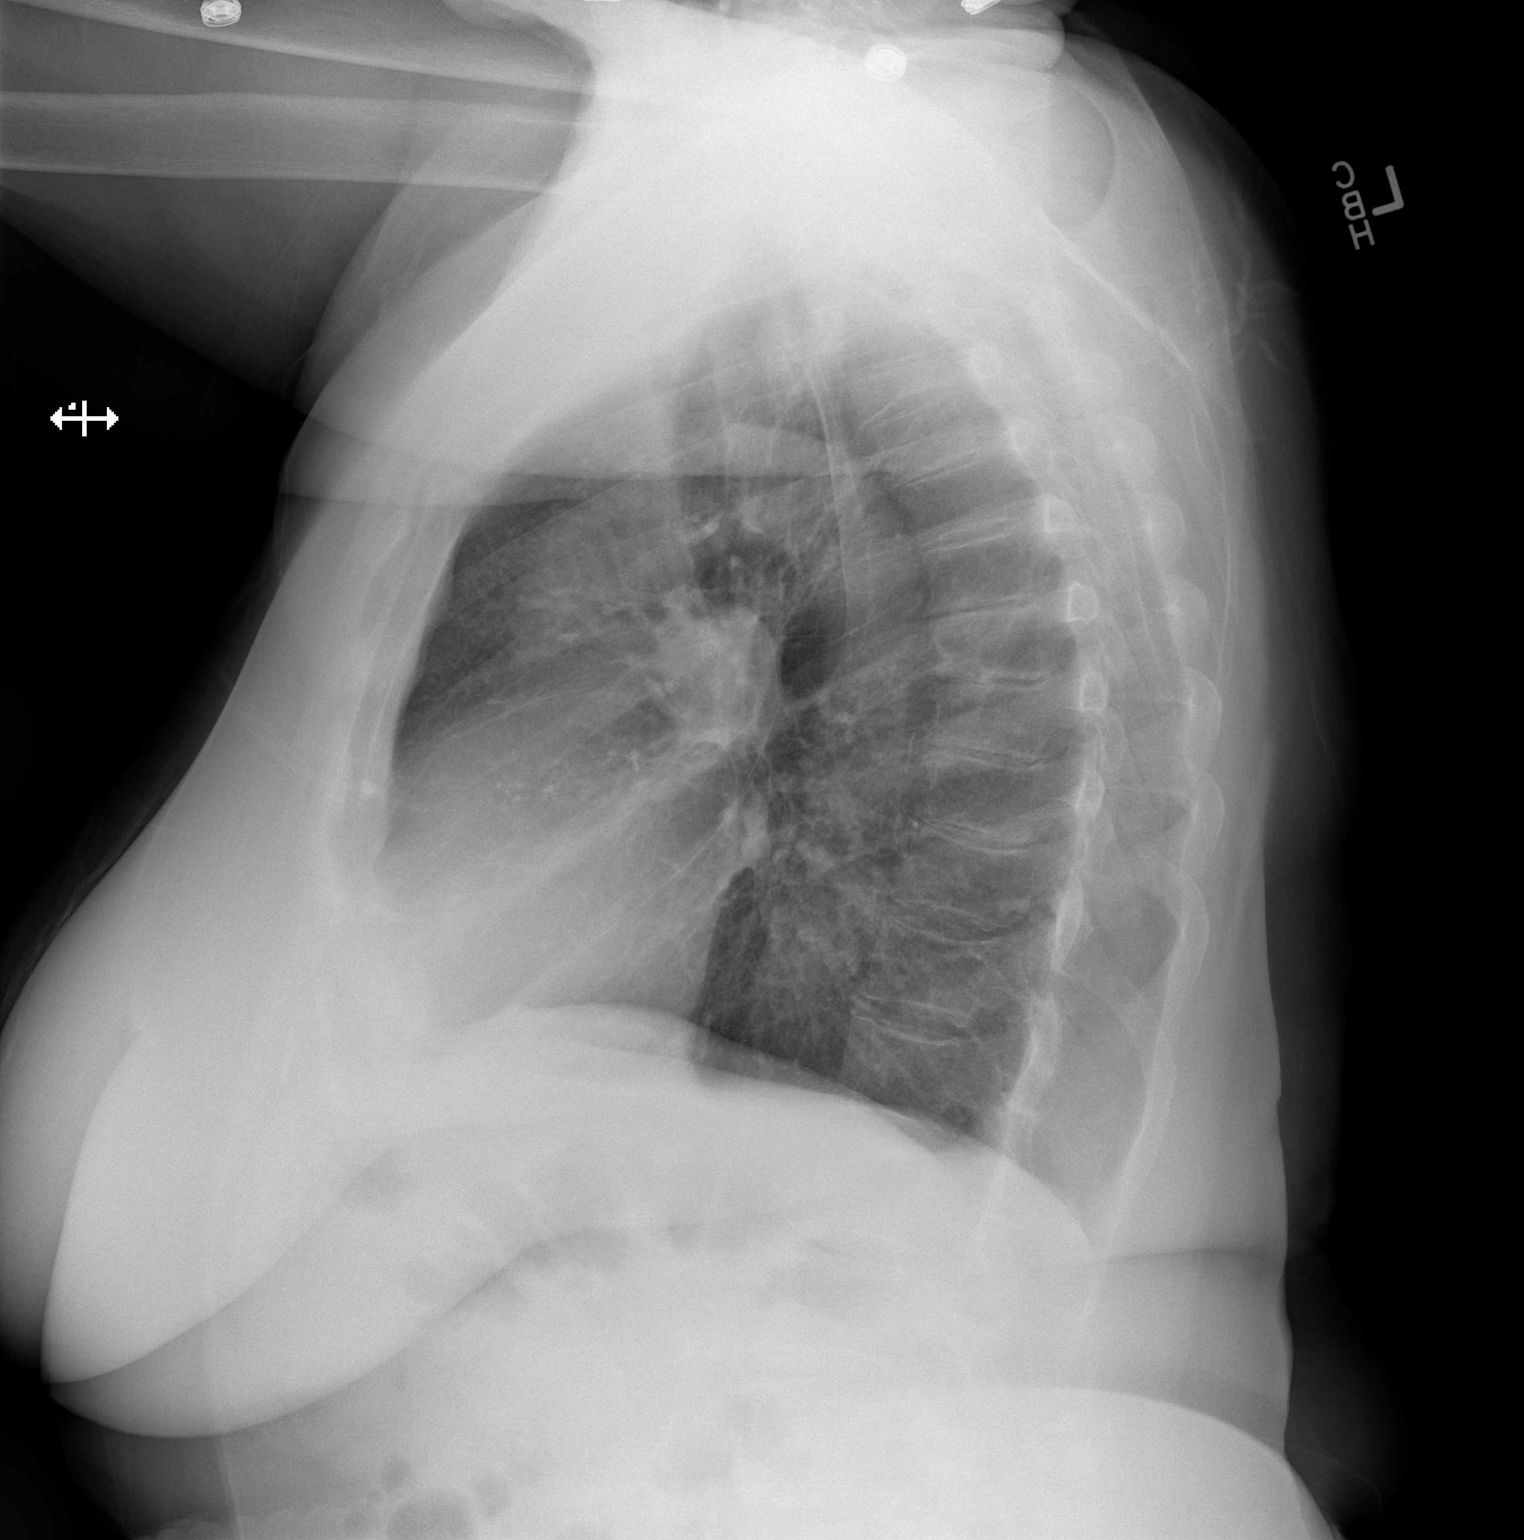

[2 of 2 positions shown; findings below may reference images not displayed]

FINDINGS: Persistent right upper lobe mass lesion is identified. The cardiac
shadow is within normal limits. The lungs are otherwise clear. No
acute bony abnormality is noted.
IMPRESSION: Stable right upper lobe mass.

## 2015-02-18 ENCOUNTER — Ambulatory Visit (INDEPENDENT_AMBULATORY_CARE_PROVIDER_SITE_OTHER): Payer: Medicare Other | Admitting: Podiatry

## 2015-02-18 ENCOUNTER — Encounter: Payer: Self-pay | Admitting: Podiatry

## 2015-02-18 ENCOUNTER — Ambulatory Visit: Payer: Medicare Other | Admitting: Podiatry

## 2015-02-18 DIAGNOSIS — M79676 Pain in unspecified toe(s): Secondary | ICD-10-CM

## 2015-02-18 DIAGNOSIS — B351 Tinea unguium: Secondary | ICD-10-CM

## 2015-02-18 DIAGNOSIS — I739 Peripheral vascular disease, unspecified: Secondary | ICD-10-CM

## 2015-02-18 NOTE — Progress Notes (Signed)
Patient ID: [name], female   DOB: 02-03-1928, 81 y.o.   MRN: [id number] HPI  Complaint:  Visit Type: Patient returns to my office for continued preventative foot care services. Complaint: Patient states" my nails have grown long and thick and become painful to walk and wear shoes" Patient has been diagnosed with DM  She has BK amputation right leg. . This patient  presents for preventative foot care services. No changes to ROS  Podiatric Exam: Vascular: dorsalis pedis and posterior tibial pulses are negative. Capillary return is slow to refill. Temperature gradient is negative.   Sensorium: Diminished  Semmes Weinstein monofilament test.  Nail Exam: Pt has thick disfigured discolored nails with subungual debris noted left entire nail hallux through fifth toenails Ulcer Exam: There is no evidence of ulcer or pre-ulcerative changes or infection. Orthopedic Exam: Muscle tone and strength are WNL. No limitations in general ROM. No crepitus or effusions noted. Foot type and digits show no abnormalities. Bony prominences are unremarkable. Skin: No Porokeratosis. No infection or ulcers.  IPJ callus left hallux. Diagnosis:  Onychomycosis, Pain in right toe, pain in left toes  Treatment & Plan Procedures and Treatment: Consent by patient was obtained for treatment procedures. The patient understood the discussion of treatment and procedures well. All questions were answered thoroughly reviewed. Debridement of mycotic and hypertrophic toenails, 1 through 5 bilateral and clearing of subungual debris. No ulceration, no infection noted.  Return Visit-Office Procedure: Patient instructed to return to the office for a follow up visit 3 months for continued evaluation and treatment.

## 2015-02-23 ENCOUNTER — Ambulatory Visit: Payer: Medicare Other

## 2015-03-02 ENCOUNTER — Ambulatory Visit
Admission: RE | Admit: 2015-03-02 | Discharge: 2015-03-02 | Disposition: A | Discharge status: Home / Self Care | Payer: Medicare Other | Source: Ambulatory Visit

## 2015-03-02 DIAGNOSIS — Z1231 Encounter for screening mammogram for malignant neoplasm of breast: Secondary | ICD-10-CM

## 2015-03-04 ENCOUNTER — Encounter (HOSPITAL_COMMUNITY): Payer: Self-pay | Admitting: Emergency Medicine

## 2015-03-04 ENCOUNTER — Emergency Department (HOSPITAL_COMMUNITY): Payer: Medicare Other

## 2015-03-04 ENCOUNTER — Emergency Department (HOSPITAL_COMMUNITY)
Admission: EM | Admit: 2015-03-04 | Discharge: 2015-03-04 | Disposition: A | Discharge status: Home / Self Care | Payer: Medicare Other | Attending: Emergency Medicine | Admitting: Emergency Medicine

## 2015-03-04 DIAGNOSIS — R200 Anesthesia of skin: Secondary | ICD-10-CM | POA: Insufficient documentation

## 2015-03-04 DIAGNOSIS — I10 Essential (primary) hypertension: Secondary | ICD-10-CM | POA: Insufficient documentation

## 2015-03-04 DIAGNOSIS — Z87891 Personal history of nicotine dependence: Secondary | ICD-10-CM | POA: Diagnosis not present

## 2015-03-04 DIAGNOSIS — Z7902 Long term (current) use of antithrombotics/antiplatelets: Secondary | ICD-10-CM | POA: Diagnosis not present

## 2015-03-04 DIAGNOSIS — R1011 Right upper quadrant pain: Secondary | ICD-10-CM | POA: Insufficient documentation

## 2015-03-04 DIAGNOSIS — Z85118 Personal history of other malignant neoplasm of bronchus and lung: Secondary | ICD-10-CM | POA: Diagnosis not present

## 2015-03-04 DIAGNOSIS — Z8673 Personal history of transient ischemic attack (TIA), and cerebral infarction without residual deficits: Secondary | ICD-10-CM | POA: Insufficient documentation

## 2015-03-04 DIAGNOSIS — E785 Hyperlipidemia, unspecified: Secondary | ICD-10-CM | POA: Insufficient documentation

## 2015-03-04 DIAGNOSIS — J441 Chronic obstructive pulmonary disease with (acute) exacerbation: Secondary | ICD-10-CM | POA: Diagnosis not present

## 2015-03-04 DIAGNOSIS — H409 Unspecified glaucoma: Secondary | ICD-10-CM | POA: Diagnosis not present

## 2015-03-04 DIAGNOSIS — Z79899 Other long term (current) drug therapy: Secondary | ICD-10-CM | POA: Insufficient documentation

## 2015-03-04 DIAGNOSIS — R61 Generalized hyperhidrosis: Secondary | ICD-10-CM | POA: Insufficient documentation

## 2015-03-04 DIAGNOSIS — M1990 Unspecified osteoarthritis, unspecified site: Secondary | ICD-10-CM | POA: Diagnosis not present

## 2015-03-04 DIAGNOSIS — R109 Unspecified abdominal pain: Secondary | ICD-10-CM

## 2015-03-04 DIAGNOSIS — R1013 Epigastric pain: Secondary | ICD-10-CM | POA: Diagnosis not present

## 2015-03-04 LAB — CBC WITH DIFFERENTIAL/PLATELET
Basophils Absolute: 0 10*3/uL (ref 0.0–0.1)
Basophils Relative: 0 % (ref 0–1)
Eosinophils Absolute: 0 10*3/uL (ref 0.0–0.7)
Eosinophils Relative: 1 % (ref 0–5)
HCT: 42.4 % (ref 36.0–46.0)
Hemoglobin: 13.6 g/dL (ref 12.0–15.0)
Lymphocytes Relative: 51 % — ABNORMAL HIGH (ref 12–46)
Lymphs Abs: 2.3 10*3/uL (ref 0.7–4.0)
MCH: 28.9 pg (ref 26.0–34.0)
MCHC: 32.1 g/dL (ref 30.0–36.0)
MCV: 90.2 fL (ref 78.0–100.0)
Monocytes Absolute: 0.3 10*3/uL (ref 0.1–1.0)
Monocytes Relative: 7 % (ref 3–12)
Neutro Abs: 1.9 10*3/uL (ref 1.7–7.7)
Neutrophils Relative %: 41 % — ABNORMAL LOW (ref 43–77)
Platelets: 264 10*3/uL (ref 150–400)
RBC: 4.7 MIL/uL (ref 3.87–5.11)
RDW: 14.3 % (ref 11.5–15.5)
WBC: 4.6 10*3/uL (ref 4.0–10.5)

## 2015-03-04 LAB — COMPREHENSIVE METABOLIC PANEL
ALT: 11 U/L — ABNORMAL LOW (ref 14–54)
AST: 20 U/L (ref 15–41)
Albumin: 3.5 g/dL (ref 3.5–5.0)
Alkaline Phosphatase: 55 U/L (ref 38–126)
Anion gap: 7 (ref 5–15)
BUN: 17 mg/dL (ref 6–20)
CO2: 25 mmol/L (ref 22–32)
Calcium: 8.9 mg/dL (ref 8.9–10.3)
Chloride: 106 mmol/L (ref 101–111)
Creatinine, Ser: 0.99 mg/dL (ref 0.44–1.00)
GFR calc Af Amer: 59 mL/min — ABNORMAL LOW (ref >60)
GFR calc non Af Amer: 51 mL/min — ABNORMAL LOW (ref >60)
Glucose, Bld: 122 mg/dL — ABNORMAL HIGH (ref 65–99)
Potassium: 4.6 mmol/L (ref 3.5–5.1)
Sodium: 138 mmol/L (ref 135–145)
Total Bilirubin: 0.3 mg/dL (ref 0.3–1.2)
Total Protein: 7 g/dL (ref 6.5–8.1)

## 2015-03-04 LAB — LACTIC ACID, PLASMA: Lactic Acid, Venous: 2.3 mmol/L (ref 0.5–2.0)

## 2015-03-04 LAB — LIPASE, BLOOD: Lipase: 20 U/L — ABNORMAL LOW (ref 22–51)

## 2015-03-04 MED ORDER — MORPHINE SULFATE 2 MG/ML IJ SOLN
2.0000 mg | Freq: Once | INTRAMUSCULAR | Status: AC
Start: 2015-03-04 — End: 2015-03-04
  Administered 2015-03-04: 2 mg via INTRAVENOUS
  Filled 2015-03-04: qty 1

## 2015-03-04 MED ORDER — ONDANSETRON HCL 4 MG/2ML IJ SOLN
4.0000 mg | Freq: Once | INTRAMUSCULAR | Status: AC
  Administered 2015-03-04: 4 mg via INTRAVENOUS
  Filled 2015-03-04: qty 2

## 2015-03-04 MED ORDER — IOHEXOL 350 MG/ML SOLN
100.0000 mL | Freq: Once | INTRAVENOUS | Status: AC | PRN
  Administered 2015-03-04: 100 mL via INTRAVENOUS

## 2015-03-04 MED ORDER — SODIUM CHLORIDE 0.9 % IV SOLN
1000.0000 mL | Freq: Once | INTRAVENOUS | Status: AC
  Administered 2015-03-04: 1000 mL via INTRAVENOUS

## 2015-03-04 NOTE — ED Provider Notes (Signed)
CSN: [id number]     Arrival date & time 03/04/15  0819 History   First MD Initiated Contact with Patient 03/04/15 [id number]     Chief Complaint  Patient presents with  . Abdominal Pain     (Consider location/radiation/quality/duration/timing/severity/associated sxs/prior Treatment) Patient is a 81 y.o. female presenting with abdominal pain. The history is provided by the patient.  Abdominal Pain Pain location:  Epigastric Pain quality: sharp and shooting   Pain radiation: head. Pain severity:  Severe Onset quality:  Sudden Duration:  3 days Timing:  Intermittent Progression:  Waxing and waning Chronicity:  New Relieved by:  Nothing Worsened by:  Nothing tried Ineffective treatments:  None tried Associated symptoms: shortness of breath   Associated symptoms: no chest pain, no chills, no constipation, no cough, no diarrhea, no dysuria, no fatigue, no fever, no melena, no nausea and no vomiting   Risk factors: being elderly    81 yo F with a chief complaint of epigastric abdominal pain. This pain has been an off and on for the past 3 or 4 days. Patient is unsure what she does when it starts usually associated with diaphoresis and radiates to her head. Patient denies any chest pain with these episodes that does have some mild shortness of breath with this. Patient denies cough fever vomiting diarrhea. He went this morning happened when the patient was getting dressed she said when she buttoned her pants the pressure from her pants causes the pain to suddenly worsen. On EMS arrival they documented an 86% pulse ox and the patient was diaphoretic. On arrival here that completely resolved. The patient feels this all stems to a new medication that she was started on couple weeks ago. Denies any other inciting factor. Resting seems to make it better.  Past Medical History  Diagnosis Date  . Stroke   . Arthritis   . Hypertension   . Hyperlipidemia   . Glaucoma   . Peripheral vascular disease,  unspecified     with Claudication  . COPD GOLD I 04/18/2013  . Shortness of breath   . Cancer     rt lung  . Lung cancer 06/07/2013   Past Surgical History  Procedure Laterality Date  . Above knee leg amputation      Right  . Angioplasty / stenting iliac  03/22/09    Aortogram-  left common iliac artery  . Hemorrhoid surgery    . Tonsillectomy    . Colonoscopy  Feb. 2013  . Video bronchoscopy Bilateral 04/10/2013    Procedure: VIDEO BRONCHOSCOPY WITH FLUORO;  Surgeon: Tanda Rockers, MD;  Location: [facility];  Service: Cardiopulmonary;  Laterality: Bilateral;  . Video bronchoscopy N/A 06/16/2013    Procedure: VIDEO BRONCHOSCOPY;  Surgeon: [name], MD;  Location: [facility] OR;  Service: Thoracic;  Laterality: N/A;  . Video assisted thoracoscopy (vats)/wedge resection Right 06/16/2013    Procedure: VIDEO ASSISTED THORACOSCOPY (VATS)/WEDGE RESECTION;  Surgeon: [name], MD;  Location: [facility];  Service: Thoracic;  Laterality: Right;   Family History  Problem Relation Age of Onset  . Stroke Brother   . Stomach cancer Mother   . Asthma Sister   . Colon cancer Brother    History  Substance Use Topics  . Smoking status: Former Smoker -- 0.50 packs/day for 60 years    Types: Cigarettes    Quit date: 07/31/2009  . Smokeless tobacco: Never Used  . Alcohol Use: No   OB History    No data  available     Review of Systems  Constitutional: Positive for diaphoresis. Negative for fever, chills and fatigue.  HENT: Negative for congestion and rhinorrhea.   Eyes: Negative for redness and visual disturbance.  Respiratory: Positive for shortness of breath. Negative for cough and wheezing.   Cardiovascular: Negative for chest pain and palpitations.  Gastrointestinal: Positive for abdominal pain. Negative for nausea, vomiting, diarrhea, constipation and melena.  Genitourinary: Negative for dysuria and urgency.  Musculoskeletal: Negative for myalgias and arthralgias.  Skin:  Negative for pallor and wound.  Neurological: Positive for numbness. Negative for dizziness and headaches.      Allergies  Aspirin and Morphine and related  Home Medications   Prior to Admission medications   Medication Sig Start Date End Date Taking? Authorizing Provider  acetaminophen (TYLENOL) 500 MG tablet Take 500 mg by mouth daily as needed for mild pain, moderate pain or headache.    Yes Historical Provider, MD  albuterol (PROAIR HFA) 108 (90 BASE) MCG/ACT inhaler Inhale 2 puffs into the lungs every 6 (six) hours as needed for wheezing (wheezing).    Yes Historical Provider, MD  cholecalciferol (VITAMIN D) 1000 UNITS tablet Take 1,000 Units by mouth daily.   Yes Historical Provider, MD  clopidogrel (PLAVIX) 75 MG tablet Take 75 mg by mouth 3 (three) times a week. Monday, Wednesday, friday   Yes Historical Provider, MD  cyclobenzaprine (FLEXERIL) 10 MG tablet Take 10 mg by mouth 2 (two) times daily.   Yes Historical Provider, MD  losartan (COZAAR) 50 MG tablet Take 50 mg by mouth daily.  01/19/15  Yes Historical Provider, MD  meclizine (ANTIVERT) 25 MG tablet Take 25 mg by mouth 3 (three) times daily as needed for dizziness (dizziness). For dizziness   Yes Historical Provider, MD  omeprazole (PRILOSEC) 20 MG capsule Take 20 mg by mouth daily before breakfast.    Yes Historical Provider, MD  pravastatin (PRAVACHOL) 20 MG tablet Take 20 mg by mouth at bedtime.    Yes Historical Provider, MD  RESTASIS 0.05 % ophthalmic emulsion Place 1 drop into both eyes every 12 (twelve) hours.  01/06/13  Yes Historical Provider, MD  Sennosides (SENNA LAX PO) Take 1-2 tablets by mouth daily as needed (constipation). For constipation   Yes Historical Provider, MD  traMADol (ULTRAM) 50 MG tablet Take 50 mg by mouth 2 (two) times daily as needed for moderate pain or severe pain (pain).   Yes Historical Provider, MD  TRAVATAN Z 0.004 % SOLN ophthalmic solution Place 1 drop into both eyes at bedtime.  08/19/14   Yes Historical Provider, MD   BP 159/56 mmHg  Pulse 79  Temp(Src) 97.5 F (36.4 C) (Oral)  Resp 18  Ht '5\' 4"'$  (1.626 m)  Wt 150 lb (68.04 kg)  BMI 25.73 kg/m2  SpO2 100% Physical Exam  Constitutional: She is oriented to person, place, and time. She appears well-developed and well-nourished. No distress.  HENT:  Head: Normocephalic and atraumatic.  Eyes: EOM are normal. Pupils are equal, round, and reactive to light.  Neck: Normal range of motion. Neck supple.  Cardiovascular: Normal rate and regular rhythm.  Exam reveals no gallop and no friction rub.   No murmur heard. Pulmonary/Chest: Effort normal. She has no wheezes. She has no rales.  Abdominal: Soft. She exhibits no distension. There is tenderness (Epigastric and mild right upper quadrant negative Murphy sign). There is no rebound and no guarding.  Musculoskeletal: She exhibits no edema or tenderness.  Neurological: She is alert  and oriented to person, place, and time.  Skin: Skin is warm and dry. She is not diaphoretic.  Psychiatric: She has a normal mood and affect. Her behavior is normal.    ED Course  Procedures (including critical care time) Labs Review Labs Reviewed  CBC WITH DIFFERENTIAL/PLATELET - Abnormal; Notable for the following:    Neutrophils Relative % 41 (*)    Lymphocytes Relative 51 (*)    All other components within normal limits  COMPREHENSIVE METABOLIC PANEL - Abnormal; Notable for the following:    Glucose, Bld 122 (*)    ALT 11 (*)    GFR calc non Af Amer 51 (*)    GFR calc Af Amer 59 (*)    All other components within normal limits  LACTIC ACID, PLASMA - Abnormal; Notable for the following:    Lactic Acid, Venous 2.3 (*)    All other components within normal limits  LIPASE, BLOOD - Abnormal; Notable for the following:    Lipase 20 (*)    All other components within normal limits    Imaging Review Dg Chest 2 View  03/04/2015   CLINICAL DATA:  81 year old female with increased shortness of  breath. History of lung cancer diagnosed 2014. Prior partial right lung resection. Initial encounter.  EXAM: CHEST  2 VIEW  COMPARISON:  10/29/2014 CT.  08/21/2013 chest x-ray.  FINDINGS: No infiltrate, congestive heart failure or pneumothorax.  Mild central pulmonary vascular prominence stable.  The CT detected left base 9 mm nodule is not adequately assessed by plain film exam.  Calcified tortuous aorta.  Heart size top-normal.  Right third rib bony overgrowth unchanged.  Mild acromioclavicular joint degenerative changes.  IMPRESSION: No infiltrate, congestive heart failure or pneumothorax.  Mild central pulmonary vascular prominence stable.  The CT detected left base 9 mm nodule is not adequately assessed by plain film exam.  Calcified tortuous aorta.  Heart size top-normal.   Electronically Signed   By: Genia Del M.D.   On: 03/04/2015 09:54   Ct Angio Chest Aorta W/cm &/or Wo/cm  03/04/2015   CLINICAL DATA:  Abdominal/epigastric pain. Shortness of breath. History of right-sided lung cancer status post resection.  EXAM: CT ANGIOGRAPHY CHEST, ABDOMEN AND PELVIS  TECHNIQUE: Multidetector CT imaging through the chest, abdomen and pelvis was performed using the standard protocol during bolus administration of intravenous contrast. Multiplanar reconstructed images and MIPs were obtained and reviewed to evaluate the vascular anatomy. Noncontrast chest CT was also performed to evaluate for aortic intramural hematoma.  CONTRAST:  154m OMNIPAQUE IOHEXOL 350 MG/ML SOLN  COMPARISON:  Chest CT 10/29/2014.  PET-CT 04/30/2013.  FINDINGS: CTA CHEST FINDINGS  There is no evidence of thoracic aortic intramural hematoma on noncontrast images. Evaluation of the proximal ascending aorta is mildly limited due to cardiac motion. The thoracic aorta is normal in caliber without evidence of dissection or aneurysm. Advanced aortic atherosclerosis is again seen as well as three-vessel coronary artery calcification. Right atrial  enlargement is again noted. No enlarged axillary, mediastinal, or hilar lymph nodes are identified. There is no pleural or pericardial effusion.  Centrilobular emphysema is noted. Sequelae of prior right upper lobectomy are again identified. Mild scarring is again seen in the right lower lobe. 9 mm left lower lobe nodule is unchanged (series 7, image 34). Moderate thoracic spine disc degeneration is noted.  Review of the MIP images confirms the above findings.  CTA ABDOMEN AND PELVIS FINDINGS  There is no evidence of abdominal aortic dissection. There is  extensive, predominantly noncalcified plaque throughout the abdominal aorta and iliac arteries without aneurysm. 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 SMA, IMA, and renal arteries are patent. There is minimal narrowing of the SMA origin. There is likely mild stenosis of the proximal renal arteries bilaterally due to calcified plaque.  Subcentimeter hypodensity in the right hepatic lobe is unchanged and too small to characterize. Small region of ill-defined hyperenhancement in the inferior right hepatic lobe likely represents a transient perfusion anomaly. The gallbladder and pancreas are grossly unremarkable on this arterial phase imaging. Slightly nodular adrenal thickening is again seen bilaterally. A calcification is noted in the spleen. Vascular calcifications and/or punctate nonobstructing calculi are present in the right renal hilum. There is a 3.1 cm cyst in the lower pole of the left kidney. 1.5 cm low-density lesion with enhancing internal septation versus 2 adjacent cysts does not appear significantly changed in size compared to 07/27/2014 chest CT.  There is no evidence of bowel obstruction. No gross bowel wall thickening is seen. Appendix is unremarkable. There is colonic diverticulosis without  evidence of diverticulitis. There is a moderate amount of stool in the rectum. Bladder is unremarkable. Uterus and ovaries are identified. No pelvic mass is seen. No free fluid or enlarged lymph nodes are identified. Moderate, diffuse lumbar disc degeneration is present with vacuum disc phenomenon at each level.  Review of the MIP images confirms the above findings.  IMPRESSION: 1. Extensive atherosclerotic disease throughout the aorta without evidence of dissection or aneurysm. 2. 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 3. No acute abnormality identified in the abdomen or pelvis. 4. Unchanged 9 mm left lower lobe pulmonary nodule. 5. Possibly complex left renal cyst, unchanged in size from 06/2014. This could be further evaluated with renal ultrasound. 6. Colonic diverticulosis.   Electronically Signed   By: Logan Bores   On: 03/04/2015 12:28   Ct Angio Abd/pel W/ And/or W/o  03/04/2015   CLINICAL DATA:  Abdominal/epigastric pain. Shortness of breath. History of right-sided lung cancer status post resection.  EXAM: CT ANGIOGRAPHY CHEST, ABDOMEN AND PELVIS  TECHNIQUE: Multidetector CT imaging through the chest, abdomen and pelvis was performed using the standard protocol during bolus administration of intravenous contrast. Multiplanar reconstructed images and MIPs were obtained and reviewed to evaluate the vascular anatomy. Noncontrast chest CT was also performed to evaluate for aortic intramural hematoma.  CONTRAST:  155m OMNIPAQUE IOHEXOL 350 MG/ML SOLN  COMPARISON:  Chest CT 10/29/2014.  PET-CT 04/30/2013.  FINDINGS: CTA CHEST FINDINGS  There is no evidence of thoracic aortic intramural hematoma on noncontrast images. Evaluation of the proximal ascending aorta is mildly limited due to cardiac motion. The thoracic aorta is normal in caliber without evidence of dissection or aneurysm. Advanced aortic atherosclerosis is again seen as well as three-vessel coronary artery  calcification. Right atrial enlargement is again noted. No enlarged axillary, mediastinal, or hilar lymph nodes are identified. There is no pleural or pericardial effusion.  Centrilobular emphysema is noted. Sequelae of prior right upper lobectomy are again identified. Mild scarring is again seen in the right lower lobe. 9 mm left lower lobe nodule is unchanged (series 7, image 34). Moderate thoracic spine disc degeneration is noted.  Review of the MIP images confirms the above findings.  CTA ABDOMEN AND PELVIS FINDINGS  There is no evidence of abdominal aortic  dissection. There is extensive, predominantly noncalcified plaque throughout the abdominal aorta and iliac arteries without aneurysm. 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 SMA, IMA, and renal arteries are patent. There is minimal narrowing of the SMA origin. There is likely mild stenosis of the proximal renal arteries bilaterally due to calcified plaque.  Subcentimeter hypodensity in the right hepatic lobe is unchanged and too small to characterize. Small region of ill-defined hyperenhancement in the inferior right hepatic lobe likely represents a transient perfusion anomaly. The gallbladder and pancreas are grossly unremarkable on this arterial phase imaging. Slightly nodular adrenal thickening is again seen bilaterally. A calcification is noted in the spleen. Vascular calcifications and/or punctate nonobstructing calculi are present in the right renal hilum. There is a 3.1 cm cyst in the lower pole of the left kidney. 1.5 cm low-density lesion with enhancing internal septation versus 2 adjacent cysts does not appear significantly changed in size compared to 07/27/2014 chest CT.  There is no evidence of bowel obstruction. No gross bowel wall thickening is seen. Appendix is unremarkable. There is  colonic diverticulosis without evidence of diverticulitis. There is a moderate amount of stool in the rectum. Bladder is unremarkable. Uterus and ovaries are identified. No pelvic mass is seen. No free fluid or enlarged lymph nodes are identified. Moderate, diffuse lumbar disc degeneration is present with vacuum disc phenomenon at each level.  Review of the MIP images confirms the above findings.  IMPRESSION: 1. Extensive atherosclerotic disease throughout the aorta without evidence of dissection or aneurysm. 2. 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 3. No acute abnormality identified in the abdomen or pelvis. 4. Unchanged 9 mm left lower lobe pulmonary nodule. 5. Possibly complex left renal cyst, unchanged in size from 06/2014. This could be further evaluated with renal ultrasound. 6. Colonic diverticulosis.   Electronically Signed   By: Logan Bores   On: 03/04/2015 12:28     EKG Interpretation   Date/Time:  Thursday March 04 2015 08:23:46 EDT Ventricular Rate:  68 PR Interval:  181 QRS Duration: 116 QT Interval:  439 QTC Calculation: 467 R Axis:   90 Text Interpretation:  Sinus rhythm Incomplete left bundle branch block Low  voltage, extremity leads morphology change in inferior and lateral leads  Confirmed by Caral Whan MD, DANIEL ([id number]) on 03/04/2015 8:27:38 AM      MDM   Final diagnoses:  Epigastric abdominal pain    81 yo F with epigastric abdominal pain that radiates to her head. This been off and on for the past couple days. Patient has a history of COPD, hypertension lung cancer. Denies prior history of MI. History of AKA.  Will obtain EKG CBC CMP lipase urine. Lactic acid. Chest x-ray. Clear lung sounds on my exam.  Lag acid mildly elevated. Of note patient has taken a couple breathing treatment this morning. Patient given a liter of fluids with improvement of her pain. CTA of the chest abdomen and pelvis was negative for acute dissection or  intra-abdominal pathology. Patient was reassessed feeling much better requesting discharge home. Discussed lab results and imaging results.  1:14 PM:  I have discussed the diagnosis/risks/treatment options with the patient and believe the pt to be eligible for discharge home to follow-up with PCP. We also discussed returning to the ED immediately if new or worsening sx occur. We discussed the  sx which are most concerning (e.g., sudden worsening pain) that necessitate immediate return. Medications administered to the patient during their visit and any new prescriptions provided to the patient are listed below.  Medications given during this visit Medications  0.9 %  sodium chloride infusion (1,000 mLs Intravenous New Bag/Given 03/04/15 0937)  ondansetron [facility]) injection 4 mg (4 mg Intravenous Given 03/04/15 0937)  morphine 2 MG/ML injection 2 mg (2 mg Intravenous Given 03/04/15 0937)  iohexol (OMNIPAQUE) 350 MG/ML injection 100 mL (100 mLs Intravenous Contrast Given 03/04/15 1102)    New Prescriptions   No medications on file     The patient appears reasonably screen and/or stabilized for discharge and I doubt any other medical condition or other [facility] requiring further screening, evaluation, or treatment in the ED at this time prior to discharge.    Deno Etienne, DO 03/04/15 1511

## 2015-03-04 NOTE — ED Notes (Signed)
Dr. Floyd at bedside. 

## 2015-03-04 NOTE — Discharge Instructions (Signed)

## 2015-03-04 NOTE — ED Notes (Signed)
Pt comes in today with a c/o abdominal pain and shortness of breath. Pt states this started this morning. Pt states that she was started on a new medication that she cannot identify and it has made her feel this way since starting the medication.

## 2015-04-06 ENCOUNTER — Ambulatory Visit: Payer: Medicare Other | Admitting: Podiatry

## 2015-05-03 ENCOUNTER — Ambulatory Visit (HOSPITAL_COMMUNITY)
Admission: RE | Admit: 2015-05-03 | Discharge: 2015-05-03 | Disposition: A | Discharge status: Home / Self Care | Payer: Medicare Other | Source: Ambulatory Visit | Attending: Internal Medicine | Admitting: Internal Medicine

## 2015-05-03 ENCOUNTER — Other Ambulatory Visit (HOSPITAL_BASED_OUTPATIENT_CLINIC_OR_DEPARTMENT_OTHER): Payer: Medicare Other

## 2015-05-03 DIAGNOSIS — R911 Solitary pulmonary nodule: Secondary | ICD-10-CM | POA: Diagnosis not present

## 2015-05-03 DIAGNOSIS — C3411 Malignant neoplasm of upper lobe, right bronchus or lung: Secondary | ICD-10-CM | POA: Diagnosis not present

## 2015-05-03 DIAGNOSIS — I2510 Atherosclerotic heart disease of native coronary artery without angina pectoris: Secondary | ICD-10-CM | POA: Diagnosis not present

## 2015-05-03 DIAGNOSIS — J439 Emphysema, unspecified: Secondary | ICD-10-CM | POA: Insufficient documentation

## 2015-05-03 LAB — COMPREHENSIVE METABOLIC PANEL (CC13)
ALT: 10 U/L (ref 0–55)
AST: 14 U/L (ref 5–34)
Albumin: 3.7 g/dL (ref 3.5–5.0)
Alkaline Phosphatase: 62 U/L (ref 40–150)
Anion Gap: 10 mEq/L (ref 3–11)
BUN: 11.7 mg/dL (ref 7.0–26.0)
CO2: 23 mEq/L (ref 22–29)
Calcium: 9.6 mg/dL (ref 8.4–10.4)
Chloride: 108 mEq/L (ref 98–109)
Creatinine: 1 mg/dL (ref 0.6–1.1)
EGFR: 59 mL/min/{1.73_m2} — ABNORMAL LOW (ref >90)
Glucose: 106 mg/dl (ref 70–140)
Potassium: 4.4 mEq/L (ref 3.5–5.1)
Sodium: 142 mEq/L (ref 136–145)
Total Bilirubin: 0.53 mg/dL (ref 0.20–1.20)
Total Protein: 7.1 g/dL (ref 6.4–8.3)

## 2015-05-03 LAB — CBC WITH DIFFERENTIAL/PLATELET
BASO%: 0.8 % (ref 0.0–2.0)
Basophils Absolute: 0 10*3/uL (ref 0.0–0.1)
EOS%: 2 % (ref 0.0–7.0)
Eosinophils Absolute: 0.1 10*3/uL (ref 0.0–0.5)
HCT: 44.2 % (ref 34.8–46.6)
HGB: 14.3 g/dL (ref 11.6–15.9)
LYMPH%: 43.9 % (ref 14.0–49.7)
MCH: 29.2 pg (ref 25.1–34.0)
MCHC: 32.3 g/dL (ref 31.5–36.0)
MCV: 90.6 fL (ref 79.5–101.0)
MONO#: 0.5 10*3/uL (ref 0.1–0.9)
MONO%: 9.7 % (ref 0.0–14.0)
NEUT#: 2.4 10*3/uL (ref 1.5–6.5)
NEUT%: 43.6 % (ref 38.4–76.8)
Platelets: 288 10*3/uL (ref 145–400)
RBC: 4.88 10*6/uL (ref 3.70–5.45)
RDW: 14.5 % (ref 11.2–14.5)
WBC: 5.6 10*3/uL (ref 3.9–10.3)
lymph#: 2.5 10*3/uL (ref 0.9–3.3)

## 2015-05-06 ENCOUNTER — Other Ambulatory Visit: Payer: Self-pay | Admitting: Cardiothoracic Surgery

## 2015-05-10 ENCOUNTER — Telehealth: Payer: Self-pay | Admitting: Internal Medicine

## 2015-05-10 ENCOUNTER — Encounter: Payer: Self-pay | Admitting: Internal Medicine

## 2015-05-10 ENCOUNTER — Ambulatory Visit (HOSPITAL_BASED_OUTPATIENT_CLINIC_OR_DEPARTMENT_OTHER): Payer: Medicare Other | Admitting: Internal Medicine

## 2015-05-10 VITALS — BP 173/61 | HR 80 | Temp 98.4°F | Resp 18 | Ht 64.0 in | Wt 150.6 lb

## 2015-05-10 DIAGNOSIS — I10 Essential (primary) hypertension: Secondary | ICD-10-CM

## 2015-05-10 DIAGNOSIS — R911 Solitary pulmonary nodule: Secondary | ICD-10-CM | POA: Diagnosis not present

## 2015-05-10 DIAGNOSIS — Z789 Other specified health status: Secondary | ICD-10-CM | POA: Insufficient documentation

## 2015-05-10 DIAGNOSIS — C3432 Malignant neoplasm of lower lobe, left bronchus or lung: Secondary | ICD-10-CM

## 2015-05-10 DIAGNOSIS — C3411 Malignant neoplasm of upper lobe, right bronchus or lung: Secondary | ICD-10-CM

## 2015-05-10 NOTE — Telephone Encounter (Signed)
Gave adn printed appt sched and avs for pt for OCT °

## 2015-05-10 NOTE — Progress Notes (Signed)
[name] [phone]) [phone]   [phone]) [phone]  OFFICE PROGRESS NOTE  Reymundo Poll, MD [phone] [address] [place]. [place] [address]  DIAGNOSIS: Stage IB (T2a, N0, M0) non-small cell lung cancer consistent with moderately differentiated adenocarcinoma diagnosed in September of 2014.   PRIOR THERAPY: Bronchoscopy, right video-assisted thoracoscopy, minithoracotomy, right upper lobectomy with lymph node dissection under the care of Dr. Servando Snare on 06/16/2013. Tumor size was 3.5 CM and the myriad genetics showed high risk for recurrence.  CURRENT THERAPY: Observation.  INTERVAL HISTORY: [name] 81 y.o. female returns to the clinic today for follow-up visit. The patient is feeling fine today with no specific complaints except for uncontrolled hypertension and she is seeing a new primary care physician Dr. Dorna Mai. The patient denied having any significant weight loss or night sweats. She has no chest pain or hemoptysis. The patient denied having any significant fever or chills. She has been observation since her surgery. She had repeat CT scan of the chest performed recently and she is here for evaluation and discussion of her scan results.  MEDICAL HISTORY: Past Medical History  Diagnosis Date  . Stroke ([place])   . Arthritis   . Hypertension   . Hyperlipidemia   . Glaucoma   . Peripheral vascular disease, unspecified ([place])     with Claudication  . COPD GOLD I 04/18/2013  . Shortness of breath   . Cancer ([place])     rt lung  . Lung cancer ([place]) 06/07/2013    ALLERGIES:  is allergic to aspirin and morphine and related.  MEDICATIONS:  Current Outpatient Prescriptions  Medication Sig Dispense Refill  . acetaminophen (TYLENOL) 500 MG tablet Take 500 mg by mouth daily as needed for mild pain, moderate pain or headache.     . albuterol (PROAIR HFA) 108 (90 BASE) MCG/ACT inhaler Inhale 2 puffs into the lungs every 6 (six) hours as needed for  wheezing (wheezing).     . cholecalciferol (VITAMIN D) 1000 UNITS tablet Take 1,000 Units by mouth daily.    . clopidogrel (PLAVIX) 75 MG tablet Take 75 mg by mouth 3 (three) times a week. Monday, Wednesday, friday    . cyclobenzaprine (FLEXERIL) 10 MG tablet Take 10 mg by mouth 2 (two) times daily.    Marland Kitchen losartan (COZAAR) 50 MG tablet Take 50 mg by mouth daily.     . meclizine (ANTIVERT) 25 MG tablet Take 25 mg by mouth 3 (three) times daily as needed for dizziness (dizziness). For dizziness    . omeprazole (PRILOSEC) 20 MG capsule Take 20 mg by mouth daily before breakfast.     . pravastatin (PRAVACHOL) 20 MG tablet Take 20 mg by mouth at bedtime.     Marland Kitchen PREPARATION H 0.25-88.44 % suppository   99  . RESTASIS 0.05 % ophthalmic emulsion Place 1 drop into both eyes every 12 (twelve) hours.     . Sennosides (SENNA LAX PO) Take 1-2 tablets by mouth daily as needed (constipation). For constipation    . traMADol (ULTRAM) 50 MG tablet Take 50 mg by mouth 2 (two) times daily as needed for moderate pain or severe pain (pain).    . TRAVATAN Z 0.004 % SOLN ophthalmic solution Place 1 drop into both eyes at bedtime.      No current facility-administered medications for this visit.    SURGICAL HISTORY:  Past Surgical History  Procedure Laterality Date  . Above knee leg amputation  Right  . Angioplasty / stenting iliac  03/22/09    Aortogram-  left common iliac artery  . Hemorrhoid surgery    . Tonsillectomy    . Colonoscopy  Feb. 2013  . Video bronchoscopy Bilateral 04/10/2013    Procedure: VIDEO BRONCHOSCOPY WITH FLUORO;  Surgeon: Tanda Rockers, MD;  Location: [facility];  Service: Cardiopulmonary;  Laterality: Bilateral;  . Video bronchoscopy N/A 06/16/2013    Procedure: VIDEO BRONCHOSCOPY;  Surgeon: [name], MD;  Location: [facility] OR;  Service: Thoracic;  Laterality: N/A;  . Video assisted thoracoscopy (vats)/wedge resection Right 06/16/2013    Procedure: VIDEO ASSISTED THORACOSCOPY  (VATS)/WEDGE RESECTION;  Surgeon: [name], MD;  Location: [facility];  Service: Thoracic;  Laterality: Right;    REVIEW OF SYSTEMS:  A comprehensive review of systems was negative.   PHYSICAL EXAMINATION: General appearance: alert, cooperative, fatigued and no distress Head: Normocephalic, without obvious abnormality, atraumatic Neck: no adenopathy, no JVD, supple, symmetrical, trachea midline and thyroid not enlarged, symmetric, no tenderness/mass/nodules Lymph nodes: Cervical, supraclavicular, and axillary nodes normal. Resp: clear to auscultation bilaterally Back: symmetric, no curvature. ROM normal. No CVA tenderness. Cardio: regular rate and rhythm, S1, S2 normal, no murmur, click, rub or gallop GI: soft, non-tender; bowel sounds normal; no masses,  no organomegaly Extremities: extremities normal, atraumatic, no cyanosis or edema  ECOG PERFORMANCE STATUS: 1 - Symptomatic but completely ambulatory  Blood pressure 173/61, pulse 80, temperature 98.4 F (36.9 C), temperature source Oral, resp. rate 18, height 5' 4"  (1.626 m), weight 150 lb 9.6 oz (68.312 kg), SpO2 100 %.  LABORATORY DATA: Lab Results  Component Value Date   WBC 5.6 05/03/2015   HGB 14.3 05/03/2015   HCT 44.2 05/03/2015   MCV 90.6 05/03/2015   PLT 288 05/03/2015      Chemistry      Component Value Date/Time   NA 142 05/03/2015 0848   NA 138 03/04/2015 0908   K 4.4 05/03/2015 0848   K 4.6 03/04/2015 0908   CL 106 03/04/2015 0908   CO2 23 05/03/2015 0848   CO2 25 03/04/2015 0908   BUN 11.7 05/03/2015 0848   BUN 17 03/04/2015 0908   CREATININE 1.0 05/03/2015 0848   CREATININE 0.99 03/04/2015 0908      Component Value Date/Time   CALCIUM 9.6 05/03/2015 0848   CALCIUM 8.9 03/04/2015 0908   ALKPHOS 62 05/03/2015 0848   ALKPHOS 55 03/04/2015 0908   AST 14 05/03/2015 0848   AST 20 03/04/2015 0908   ALT 10 05/03/2015 0848   ALT 11* 03/04/2015 0908   BILITOT 0.53 05/03/2015 0848   BILITOT 0.3  03/04/2015 0908       RADIOGRAPHIC STUDIES: Ct Chest Wo Contrast  05/03/2015   CLINICAL DATA:  Subsequent encounter for right upper lobe lung cancer, status post partial resection.  EXAM: CT CHEST WITHOUT CONTRAST  TECHNIQUE: Multidetector CT imaging of the chest was performed following the standard protocol without IV contrast.  COMPARISON:  03/04/2015.  03/11/2013.  FINDINGS: Mediastinum / Lymph Nodes: There is no axillary lymphadenopathy. No mediastinal lymphadenopathy. There is no hilar lymphadenopathy. The heart size is normal. No pericardial effusion. Coronary artery calcification is noted.  Lungs / Pleura: Changes of emphysema noted bilaterally. Surgical scarring with architectural distortion seen in the region of the right hilum, stable in the interval.  8 x 9 x 5 mm left lower lobe pulmonary nodule appears slightly more conspicuous than on the most recent comparison study and has clearly  increased in size when comparing back to 03/11/2013. The interval progression is well demonstrated on coronal reformations (compare image 57 of series 602 today to image 61 of series 5 on the 03/11/2013 exam.  MSK / Soft Tissues: Bone windows reveal no worrisome lytic or sclerotic osseous lesions.  Upper Abdomen:  Unremarkable.  IMPRESSION: 1. Small spiculated left lower lobe pulmonary nodule is clearly progressive since 03/11/2013. While metastatic disease could have this appearance, a second lung primary is also a consideration. 2. Stable appearance of postsurgical changes in the right lung. These results will be called to the ordering clinician or representative by the Radiologist Assistant, and communication documented in the PACS or zVision Dashboard.   Electronically Signed   By: Misty Stanley M.D.   On: 05/03/2015 10:35    ASSESSMENT AND PLAN: This is a very pleasant 81 years old African-American female with history of stage IB non-small cell lung cancer, adenocarcinoma status post right upper lobectomy  with lymph node dissection and has been observation since November 2014. The patient CT scan of the chest showed suspicious left lower lobe pulmonary nodule concerning for metastatic disease versus second lung primary. I discussed the scan results with the patient today.  I recommended for her to have a PET scan for further evaluation of this lesion. I will see the patient back for follow-up visit in 3 weeks for discussion of her scan results and recommendation regarding this new nodule. For hypertension, the patient was advised to take her blood pressure medication as a scheduled and also to discuss with her primary care physician adjustment of her medication. CODE STATUS was discussed with the patient today and she is still full code. The patient was advised to call immediately if she has any concerning symptoms in the interval. The patient voices understanding of current disease status and treatment options and is in agreement with the current care plan.  All questions were answered. The patient knows to call the clinic with any problems, questions or concerns. We can certainly see the patient much sooner if necessary.  Disclaimer: This note was dictated with voice recognition software. Similar sounding words can inadvertently be transcribed and may not be corrected upon review.

## 2015-05-12 ENCOUNTER — Ambulatory Visit: Payer: Medicare Other | Admitting: Family

## 2015-05-12 ENCOUNTER — Ambulatory Visit (INDEPENDENT_AMBULATORY_CARE_PROVIDER_SITE_OTHER)
Admission: RE | Admit: 2015-05-12 | Discharge: 2015-05-12 | Disposition: A | Discharge status: Home / Self Care | Payer: Medicare Other | Source: Ambulatory Visit | Attending: Family | Admitting: Family

## 2015-05-12 ENCOUNTER — Encounter: Payer: Self-pay | Admitting: Family

## 2015-05-12 ENCOUNTER — Ambulatory Visit (HOSPITAL_COMMUNITY)
Admission: RE | Admit: 2015-05-12 | Discharge: 2015-05-12 | Disposition: A | Discharge status: Home / Self Care | Payer: Medicare Other | Source: Ambulatory Visit | Attending: Family | Admitting: Family

## 2015-05-12 ENCOUNTER — Ambulatory Visit (INDEPENDENT_AMBULATORY_CARE_PROVIDER_SITE_OTHER): Payer: Medicare Other | Admitting: Family

## 2015-05-12 VITALS — BP 150/75 | HR 78 | Ht 64.0 in | Wt 150.7 lb

## 2015-05-12 DIAGNOSIS — I739 Peripheral vascular disease, unspecified: Secondary | ICD-10-CM | POA: Diagnosis not present

## 2015-05-12 DIAGNOSIS — Z48812 Encounter for surgical aftercare following surgery on the circulatory system: Secondary | ICD-10-CM | POA: Diagnosis present

## 2015-05-12 DIAGNOSIS — I6523 Occlusion and stenosis of bilateral carotid arteries: Secondary | ICD-10-CM

## 2015-05-12 DIAGNOSIS — E785 Hyperlipidemia, unspecified: Secondary | ICD-10-CM | POA: Insufficient documentation

## 2015-05-12 DIAGNOSIS — Z89611 Acquired absence of right leg above knee: Secondary | ICD-10-CM

## 2015-05-12 DIAGNOSIS — Z87891 Personal history of nicotine dependence: Secondary | ICD-10-CM

## 2015-05-12 DIAGNOSIS — I771 Stricture of artery: Secondary | ICD-10-CM | POA: Diagnosis not present

## 2015-05-12 DIAGNOSIS — R0989 Other specified symptoms and signs involving the circulatory and respiratory systems: Secondary | ICD-10-CM | POA: Diagnosis not present

## 2015-05-12 DIAGNOSIS — Z95828 Presence of other vascular implants and grafts: Secondary | ICD-10-CM

## 2015-05-12 DIAGNOSIS — I10 Essential (primary) hypertension: Secondary | ICD-10-CM | POA: Insufficient documentation

## 2015-05-12 NOTE — Progress Notes (Signed)
[facility] [place] HISTORY AND PHYSICAL   MRN : [id number]  History of Present Illness:   [name] is a 81 y.o. female patient of Dr. Scot Dock who has undergone previous left common iliac artery stenting in August of 2010. She has known infrainguinal arterial occlusive disease. She is status post previous right above-the-knee amputation and is ambulatory with her prosthesis.  She returns today for follow up.  She does have some pain in the muscle of her left leg when walking what seems to be short distance with her walker.  She also complains of occasional right stump pain.  Her prosthesis is not working properly and is causing her to fall; states she has discussed with the [employer] representative who told her that any attempt to improve would make things worse, per pt report. She denies non healing ulcers on lower extremities.  She had a VAT wedge resection right lung for cancer, November, 2014, states she is undergoing radiation treatments; she also reports it was recently found that she has a tumor in her left lung. 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 She sees a podiatrist every 3-4 months.  Patient states her blood pressure at home [phone], is elevated today. Home Health visits her periodically.  She states she was told that she had 3 TIA's, does not recall any symptoms, this was possibly in 2010.  She lives in an assisted living facility.  Pt Diabetic: No  Pt smoker: quit in 2011   Pt meds include:  Statin :Yes  Betablocker: No  ASA: No, severe allergic reaction Other anticoagulants/antiplatelets:Plavix    Current Outpatient Prescriptions  Medication Sig Dispense Refill  . acetaminophen (TYLENOL) 500 MG tablet Take 500 mg by mouth daily as needed for mild pain, moderate pain or headache.     . albuterol (PROAIR HFA) 108 (90 BASE) MCG/ACT inhaler Inhale 2 puffs into the lungs every 6 (six) hours as needed for  wheezing (wheezing).     . cholecalciferol (VITAMIN D) 1000 UNITS tablet Take 1,000 Units by mouth daily.    . clopidogrel (PLAVIX) 75 MG tablet Take 75 mg by mouth 3 (three) times a week. Monday, Wednesday, friday    . cyclobenzaprine (FLEXERIL) 10 MG tablet Take 10 mg by mouth 2 (two) times daily.    Marland Kitchen losartan (COZAAR) 50 MG tablet Take 50 mg by mouth daily.     . meclizine (ANTIVERT) 25 MG tablet Take 25 mg by mouth 3 (three) times daily as needed for dizziness (dizziness). For dizziness    . omeprazole (PRILOSEC) 20 MG capsule Take 20 mg by mouth daily before breakfast.     . pravastatin (PRAVACHOL) 20 MG tablet Take 20 mg by mouth at bedtime.     Marland Kitchen PREPARATION H 0.25-88.44 % suppository   99  . RESTASIS 0.05 % ophthalmic emulsion Place 1 drop into both eyes every 12 (twelve) hours.     . Sennosides (SENNA LAX PO) Take 1-2 tablets by mouth daily as needed (constipation). For constipation    . traMADol (ULTRAM) 50 MG tablet Take 50 mg by mouth 2 (two) times daily as needed for moderate pain or severe pain (pain).    . TRAVATAN Z 0.004 % SOLN ophthalmic solution Place 1 drop into both eyes at bedtime.      No current facility-administered medications for this visit.    Past Medical History  Diagnosis Date  . Stroke ([place])   . Arthritis   . Hypertension   .  Hyperlipidemia   . Glaucoma   . Peripheral vascular disease, unspecified ([place])     with Claudication  . COPD GOLD I 04/18/2013  . Shortness of breath   . Cancer ([place])     rt lung  . Lung cancer ([place]) 06/07/2013    Social History Social History  Substance Use Topics  . Smoking status: Former Smoker -- 0.50 packs/day for 60 years    Types: Cigarettes    Quit date: 07/31/2009  . Smokeless tobacco: Never Used  . Alcohol Use: No    Family History Family History  Problem Relation Age of Onset  . Stroke Brother   . Stomach cancer Mother   . Asthma Sister   . Colon cancer Brother     Surgical History Past Surgical  History  Procedure Laterality Date  . Above knee leg amputation      Right  . Angioplasty / stenting iliac  03/22/09    Aortogram-  left common iliac artery  . Hemorrhoid surgery    . Tonsillectomy    . Colonoscopy  Feb. 2013  . Video bronchoscopy Bilateral 04/10/2013    Procedure: VIDEO BRONCHOSCOPY WITH FLUORO;  Surgeon: Tanda Rockers, MD;  Location: [facility];  Service: Cardiopulmonary;  Laterality: Bilateral;  . Video bronchoscopy N/A 06/16/2013    Procedure: VIDEO BRONCHOSCOPY;  Surgeon: [name], MD;  Location: [facility] OR;  Service: Thoracic;  Laterality: N/A;  . Video assisted thoracoscopy (vats)/wedge resection Right 06/16/2013    Procedure: VIDEO ASSISTED THORACOSCOPY (VATS)/WEDGE RESECTION;  Surgeon: [name], MD;  Location: [facility];  Service: Thoracic;  Laterality: Right;    Allergies  Allergen Reactions  . Aspirin Anaphylaxis  . Morphine And Related Other (See Comments)    confusion    Current Outpatient Prescriptions  Medication Sig Dispense Refill  . acetaminophen (TYLENOL) 500 MG tablet Take 500 mg by mouth daily as needed for mild pain, moderate pain or headache.     . albuterol (PROAIR HFA) 108 (90 BASE) MCG/ACT inhaler Inhale 2 puffs into the lungs every 6 (six) hours as needed for wheezing (wheezing).     . cholecalciferol (VITAMIN D) 1000 UNITS tablet Take 1,000 Units by mouth daily.    . clopidogrel (PLAVIX) 75 MG tablet Take 75 mg by mouth 3 (three) times a week. Monday, Wednesday, friday    . cyclobenzaprine (FLEXERIL) 10 MG tablet Take 10 mg by mouth 2 (two) times daily.    Marland Kitchen losartan (COZAAR) 50 MG tablet Take 50 mg by mouth daily.     . meclizine (ANTIVERT) 25 MG tablet Take 25 mg by mouth 3 (three) times daily as needed for dizziness (dizziness). For dizziness    . omeprazole (PRILOSEC) 20 MG capsule Take 20 mg by mouth daily before breakfast.     . pravastatin (PRAVACHOL) 20 MG tablet Take 20 mg by mouth at bedtime.     Marland Kitchen PREPARATION H  0.25-88.44 % suppository   99  . RESTASIS 0.05 % ophthalmic emulsion Place 1 drop into both eyes every 12 (twelve) hours.     . Sennosides (SENNA LAX PO) Take 1-2 tablets by mouth daily as needed (constipation). For constipation    . traMADol (ULTRAM) 50 MG tablet Take 50 mg by mouth 2 (two) times daily as needed for moderate pain or severe pain (pain).    . TRAVATAN Z 0.004 % SOLN ophthalmic solution Place 1 drop into both eyes at bedtime.      No current facility-administered medications  for this visit.     REVIEW OF SYSTEMS: See HPI for pertinent positives and negatives.  Physical Examination Filed Vitals:   05/12/15 0813 05/12/15 0932  BP: 155/76 150/75  Pulse: 78   Height: '5\' 4"'$  (1.626 m)   Weight: 150 lb 11.2 oz (68.357 kg)   SpO2: 100%    Body mass index is 25.85 kg/(m^2).   General: A&O x 3, WDWN,  Gait: with walker  Eyes: PERRLA,  Pulmonary: CTAB, without wheezes , rales or rhonchi  Cardiac: regular Rythm , no murmur detected  Carotid Bruits  Left  Right    Negative  positive  VASCULAR EXAM:  Extremities: left leg without ischemic changes, right AKA with prosthesis in place.  No open wounds.   LE Pulses  LEFT  RIGHT   FEMORAL  1+ palpable  not palpable   POPLITEAL  not palpable  AKA   POSTERIOR TIBIAL  palpable  AKA   DORSALIS PEDIS  ANTERIOR TIBIAL  not palpable  AKA    Abdomen: soft, NT, no masses palpated. Aortic pulse is not palpable.  Skin: no rashes, no ulcers. Musculoskeletal: no muscle wasting or atrophy  Neurologic: A&O X 3; Appropriate Affect,  MOTOR FUNCTION: moving all extremities equally. Speech is fluent/normal. Hard of hearing.               Non-Invasive Vascular Imaging (05/12/2015): ILIAC ARTERY STENT EVALUATION    INDICATION: Peripheral vascular disease, left iliac artery stenosis    PREVIOUS INTERVENTION(S): Left iliac artery percutaneous transluminal angioplasty with stent  03/19/2009; known left superficial femoral artery occlusion    DUPLEX EXAM:     RIGHT  LEFT   Peak Systolic Velocity (cm/s) Ratio (if abnormal) Waveform  Peak Systolic Velocity (cm/s) Ratio (if abnormal) Waveform     Aorta - Distal 68  B     Artery - Proximal to Stent 68  B     Stent - Proximal 199  Brisk M     Stent - Mid 113  B     Stent - Distal 146  B     Artery - Distal to Stent 229  B  AKA Today's ABI / TBI .62  AKA Previous ABI / TBI ( 10/28/14 ) .66 / .45    Waveform:    M - Monophasic       B - Biphasic       T - Triphasic  If Ankle Brachial Index (ABI) or Toe Brachial Index (TBI) performed, please see complete report     ADDITIONAL FINDINGS: Right lower extremity amputated.  Left common femoral artery biphasic waveform    IMPRESSION: 1. Technically difficult exam due to bowel gas and breathing artifact 2. The left iliac artery stent appears patent, however bowel gas obstructs visualization 3. Slight increase of velocity noted distally to stent, limited visualization    Compared to the previous exam:  No change since exam of 10/28/2014       ASSESSMENT:  [name] is a 81 y.o. female who is s/p left common iliac artery stenting in August of 2010. She has known infrainguinal arterial occlusive disease. She is status post previous right above-the-knee amputation and is ambulatory with her prosthesis.   Pt states her current right AKA prosthetic is causing her to fall, is ill fitting; states she has tried working with the [employer] prosthetic representative and was told that if they try to adjust her prosthetic it will be worse. Will refer her to [employer] for  evaluation and fitting of a new right AKA prosthetic. Today's left iliac artery stent duplex suggests a technically difficult exam due to bowel gas and breathing artifact. The left iliac artery stent appears patent, however bowel gas obstructs visualization. Slight increase of velocity noted distally to stent, limited  visualization. No change since exam of 10/28/2014. Left ABI has improved. She has no signs of ischemia in her left LE.  Right carotid bruit - carotid duplex on 11/11/14 suggested minimal bilateral ICA stenoses, bilateral vertebral artery was antegrade, left ECA stenosis. Pt has a history of possible TIA's in 2010.  Face to face time with patient was 25 minutes. Over 50% of this time was spent on counseling and coordination of care.   PLAN:   Continue daily exercises as instructed by physical therapy. [employer] referral; a well fitting right AKA prosthesis will allow pt to walk better and participate in a graduated walking program. Based on today's exam and non-invasive vascular lab results, the patient will follow up in 6 months with the following tests: ABI's, left iliac artery duplex, and carotid duplex.. I discussed in depth with the patient the nature of atherosclerosis, and emphasized the importance of maximal medical management including strict control of blood pressure, blood glucose, and lipid levels, obtaining regular exercise, and cessation of smoking.  The patient is aware that without maximal medical management the underlying atherosclerotic disease process will progress, limiting the benefit of any interventions.  The patient was given information about stroke prevention and what symptoms should prompt the patient to seek immediate medical care.  The patient was given information about PAD including signs, symptoms, treatment, what symptoms should prompt the patient to seek immediate medical care, and risk reduction measures to take. Thank you for allowing [place] to participate in this patient's care.  Clemon Chambers, RN, MSN, FNP-C [facility] Office: [phone]  Clinic MD: Scot Dock  05/12/2015 9:23 AM

## 2015-05-12 NOTE — Patient Instructions (Signed)
Peripheral Vascular Disease Peripheral vascular disease (PVD) is a disease of the blood vessels that are not part of your heart and brain. A simple term for PVD is poor circulation. In most cases, PVD narrows the blood vessels that carry blood from your heart to the rest of your body. This can result in a decreased supply of blood to your arms, legs, and internal organs, like your stomach or kidneys. However, it most often affects a person's lower legs and feet. There are two types of PVD.  Organic PVD. This is the more common type. It is caused by damage to the structure of blood vessels.  Functional PVD. This is caused by conditions that make blood vessels contract and tighten (spasm). Without treatment, PVD tends to get worse over time. PVD can also lead to acute ischemic limb. This is when an arm or limb suddenly has trouble getting enough blood. This is a medical emergency. CAUSES Each type of PVD has many different causes. The most common cause of PVD is buildup of a fatty material (plaque) inside of your arteries (atherosclerosis). Small amounts of plaque can break off from the walls of the blood vessels and become lodged in a smaller artery. This blocks blood flow and can cause acute ischemic limb. Other common causes of PVD include:  Blood clots that form inside of blood vessels.  Injuries to blood vessels.  Diseases that cause inflammation of blood vessels or cause blood vessel spasms.  Health behaviors and health history that increase your risk of developing PVD. RISK FACTORS  You may have a greater risk of PVD if you:  Have a family history of PVD.  Have certain medical conditions, including:  High cholesterol.  Diabetes.  High blood pressure (hypertension).  Coronary heart disease.  Past problems with blood clots.  Past injury, such as burns or a broken bone. These may have damaged blood vessels in your limbs.  Buerger disease. This is caused by inflamed blood  vessels in your hands and feet.  Some forms of arthritis.  Rare birth defects that affect the arteries in your legs.  Use tobacco.  Do not get enough exercise.  Are obese.  Are age 50 or older. SIGNS AND SYMPTOMS  PVD may cause many different symptoms. Your symptoms depend on what part of your body is not getting enough blood. Some common signs and symptoms include:  Cramps in your lower legs. This may be a symptom of poor leg circulation (claudication).  Pain and weakness in your legs while you are physically active that goes away when you rest (intermittent claudication).  Leg pain when at rest.  Leg numbness, tingling, or weakness.  Coldness in a leg or foot, especially when compared with the other leg.  Skin or hair changes. These can include:  Hair loss.  Shiny skin.  Pale or bluish skin.  Thick toenails.  Inability to get or maintain an erection (erectile dysfunction). People with PVD are more prone to developing ulcers and sores on their toes, feet, or legs. These may take longer than normal to heal. DIAGNOSIS Your health care provider may diagnose PVD from your signs and symptoms. The health care provider will also do a physical exam. You may have tests to find out what is causing your PVD and determine its severity. Tests may include:  Blood pressure recordings from your arms and legs and measurements of the strength of your pulses (pulse volume recordings).  Imaging studies using sound waves to take pictures of   the blood flow through your blood vessels (Doppler ultrasound).  Injecting a dye into your blood vessels before having imaging studies using:  X-rays (angiogram or arteriogram).  Computer-generated X-rays (CT angiogram).  A powerful electromagnetic field and a computer (magnetic resonance angiogram or MRA). TREATMENT Treatment for PVD depends on the cause of your condition and the severity of your symptoms. It also depends on your age. Underlying  causes need to be treated and controlled. These include long-lasting (chronic) conditions, such as diabetes, high cholesterol, and high blood pressure. You may need to first try making lifestyle changes and taking medicines. Surgery may be needed if these do not work. Lifestyle changes may include:  Quitting smoking.  Exercising regularly.  Following a low-fat, low-cholesterol diet. Medicines may include:  Blood thinners to prevent blood clots.  Medicines to improve blood flow.  Medicines to improve your blood cholesterol levels. Surgical procedures may include:  A procedure that uses an inflated balloon to open a blocked artery and improve blood flow (angioplasty).  A procedure to put in a tube (stent) to keep a blocked artery open (stent implant).  Surgery to reroute blood flow around a blocked artery (peripheral bypass surgery).  Surgery to remove dead tissue from an infected wound on the affected limb.  Amputation. This is surgical removal of the affected limb. This may be necessary in cases of acute ischemic limb that are not improved through medical or surgical treatments. HOME CARE INSTRUCTIONS  Take medicines only as directed by your health care provider.  Do not use any tobacco products, including cigarettes, chewing tobacco, or electronic cigarettes. If you need help quitting, ask your health care provider.  Lose weight if you are overweight, and maintain a healthy weight as directed by your health care provider.  Eat a diet that is low in fat and cholesterol. If you need help, ask your health care provider.  Exercise regularly. Ask your health care provider to suggest some good activities for you.  Use compression stockings or other mechanical devices as directed by your health care provider.  Take good care of your feet.  Wear comfortable shoes that fit well.  Check your feet often for any cuts or sores. SEEK MEDICAL CARE IF:  You have cramps in your legs  while walking.  You have leg pain when you are at rest.  You have coldness in a leg or foot.  Your skin changes.  You have erectile dysfunction.  You have cuts or sores on your feet that are not healing. SEEK IMMEDIATE MEDICAL CARE IF:  Your arm or leg turns cold and blue.  Your arms or legs become red, warm, swollen, painful, or numb.  You have chest pain or trouble breathing.  You suddenly have weakness in your face, arm, or leg.  You become very confused or lose the ability to speak.  You suddenly have a very bad headache or lose your vision.   This information is not intended to replace advice given to you by your health care provider. Make sure you discuss any questions you have with your health care provider.   Document Released: 08/24/2004 Document Revised: 08/07/2014 Document Reviewed: 12/25/2013 Elsevier Interactive Patient Education 2016 [employer]    Stroke Prevention Some medical conditions and behaviors are associated with an increased chance of having a stroke. You may prevent a stroke by making healthy choices and managing medical conditions. HOW CAN I REDUCE MY RISK OF HAVING A STROKE?   Stay physically active. Get at   least 30 minutes of activity on most or all days.  Do not smoke. It may also be helpful to avoid exposure to secondhand smoke.  Limit alcohol use. Moderate alcohol use is considered to be:  No more than 2 drinks per day for men.  No more than 1 drink per day for nonpregnant women.  Eat healthy foods. This involves:  Eating 5 or more servings of fruits and vegetables a day.  Making dietary changes that address high blood pressure (hypertension), high cholesterol, diabetes, or obesity.  Manage your cholesterol levels.  Making food choices that are high in fiber and low in saturated fat, trans fat, and cholesterol may control cholesterol levels.  Take any prescribed medicines to control cholesterol as directed by your health care  provider.  Manage your diabetes.  Controlling your carbohydrate and sugar intake is recommended to manage diabetes.  Take any prescribed medicines to control diabetes as directed by your health care provider.  Control your hypertension.  Making food choices that are low in salt (sodium), saturated fat, trans fat, and cholesterol is recommended to manage hypertension.  Ask your health care provider if you need treatment to lower your blood pressure. Take any prescribed medicines to control hypertension as directed by your health care provider.  If you are 18-39 years of age, have your blood pressure checked every 3-5 years. If you are 40 years of age or older, have your blood pressure checked every year.  Maintain a healthy weight.  Reducing calorie intake and making food choices that are low in sodium, saturated fat, trans fat, and cholesterol are recommended to manage weight.  Stop drug abuse.  Avoid taking birth control pills.  Talk to your health care provider about the risks of taking birth control pills if you are over 35 years old, smoke, get migraines, or have ever had a blood clot.  Get evaluated for sleep disorders (sleep apnea).  Talk to your health care provider about getting a sleep evaluation if you snore a lot or have excessive sleepiness.  Take medicines only as directed by your health care provider.  For some people, aspirin or blood thinners (anticoagulants) are helpful in reducing the risk of forming abnormal blood clots that can lead to stroke. If you have the irregular heart rhythm of atrial fibrillation, you should be on a blood thinner unless there is a good reason you cannot take them.  Understand all your medicine instructions.  Make sure that other conditions (such as anemia or atherosclerosis) are addressed. SEEK IMMEDIATE MEDICAL CARE IF:   You have sudden weakness or numbness of the face, arm, or leg, especially on one side of the body.  Your face  or eyelid droops to one side.  You have sudden confusion.  You have trouble speaking (aphasia) or understanding.  You have sudden trouble seeing in one or both eyes.  You have sudden trouble walking.  You have dizziness.  You have a loss of balance or coordination.  You have a sudden, severe headache with no known cause.  You have new chest pain or an irregular heartbeat. Any of these symptoms may represent a serious problem that is an emergency. Do not wait to see if the symptoms will go away. Get medical help at once. Call your local emergency services (911 in [place] Do not drive yourself to the hospital.   This information is not intended to replace advice given to you by your health care provider. Make sure you discuss any questions   you have with your health care provider.   Document Released: 08/24/2004 Document Revised: 08/07/2014 Document Reviewed: 01/17/2013 Elsevier Interactive Patient Education 2016 Elsevier Inc.  

## 2015-05-14 NOTE — Addendum Note (Signed)
Addended by: Mena Goes on: 05/14/2015 02:30 PM   Modules accepted: Orders

## 2015-05-27 ENCOUNTER — Other Ambulatory Visit: Payer: Self-pay | Admitting: *Deleted

## 2015-05-27 DIAGNOSIS — C3490 Malignant neoplasm of unspecified part of unspecified bronchus or lung: Secondary | ICD-10-CM

## 2015-05-31 ENCOUNTER — Ambulatory Visit (HOSPITAL_BASED_OUTPATIENT_CLINIC_OR_DEPARTMENT_OTHER): Payer: Medicare Other | Admitting: Internal Medicine

## 2015-05-31 ENCOUNTER — Other Ambulatory Visit (HOSPITAL_BASED_OUTPATIENT_CLINIC_OR_DEPARTMENT_OTHER): Payer: Medicare Other

## 2015-05-31 ENCOUNTER — Encounter: Payer: Self-pay | Admitting: Internal Medicine

## 2015-05-31 ENCOUNTER — Ambulatory Visit (HOSPITAL_COMMUNITY)
Admission: RE | Admit: 2015-05-31 | Discharge: 2015-05-31 | Disposition: A | Discharge status: Home / Self Care | Payer: Medicare Other | Source: Ambulatory Visit | Attending: Internal Medicine | Admitting: Internal Medicine

## 2015-05-31 VITALS — BP 168/68 | HR 75 | Temp 98.2°F | Resp 20 | Ht 64.0 in | Wt 151.3 lb

## 2015-05-31 DIAGNOSIS — Z789 Other specified health status: Secondary | ICD-10-CM

## 2015-05-31 DIAGNOSIS — C3431 Malignant neoplasm of lower lobe, right bronchus or lung: Secondary | ICD-10-CM

## 2015-05-31 DIAGNOSIS — R918 Other nonspecific abnormal finding of lung field: Secondary | ICD-10-CM

## 2015-05-31 DIAGNOSIS — C3490 Malignant neoplasm of unspecified part of unspecified bronchus or lung: Secondary | ICD-10-CM

## 2015-05-31 DIAGNOSIS — I10 Essential (primary) hypertension: Secondary | ICD-10-CM

## 2015-05-31 DIAGNOSIS — R911 Solitary pulmonary nodule: Secondary | ICD-10-CM | POA: Insufficient documentation

## 2015-05-31 DIAGNOSIS — C3432 Malignant neoplasm of lower lobe, left bronchus or lung: Secondary | ICD-10-CM

## 2015-05-31 DIAGNOSIS — Z85118 Personal history of other malignant neoplasm of bronchus and lung: Secondary | ICD-10-CM

## 2015-05-31 DIAGNOSIS — J439 Emphysema, unspecified: Secondary | ICD-10-CM | POA: Diagnosis not present

## 2015-05-31 DIAGNOSIS — Z902 Acquired absence of lung [part of]: Secondary | ICD-10-CM | POA: Diagnosis not present

## 2015-05-31 LAB — COMPREHENSIVE METABOLIC PANEL (CC13)
ALT: 9 U/L (ref 0–55)
AST: 14 U/L (ref 5–34)
Albumin: 3.4 g/dL — ABNORMAL LOW (ref 3.5–5.0)
Alkaline Phosphatase: 67 U/L (ref 40–150)
Anion Gap: 5 mEq/L (ref 3–11)
BUN: 7.4 mg/dL (ref 7.0–26.0)
CO2: 27 mEq/L (ref 22–29)
Calcium: 9.5 mg/dL (ref 8.4–10.4)
Chloride: 111 mEq/L — ABNORMAL HIGH (ref 98–109)
Creatinine: 0.8 mg/dL (ref 0.6–1.1)
EGFR: 75 mL/min/{1.73_m2} — ABNORMAL LOW (ref >90)
Glucose: 95 mg/dl (ref 70–140)
Potassium: 4.7 mEq/L (ref 3.5–5.1)
Sodium: 143 mEq/L (ref 136–145)
Total Bilirubin: 0.42 mg/dL (ref 0.20–1.20)
Total Protein: 6.7 g/dL (ref 6.4–8.3)

## 2015-05-31 LAB — CBC WITH DIFFERENTIAL/PLATELET
BASO%: 1 % (ref 0.0–2.0)
Basophils Absolute: 0 10*3/uL (ref 0.0–0.1)
EOS%: 1 % (ref 0.0–7.0)
Eosinophils Absolute: 0 10*3/uL (ref 0.0–0.5)
HCT: 41.5 % (ref 34.8–46.6)
HGB: 13.5 g/dL (ref 11.6–15.9)
LYMPH%: 46.8 % (ref 14.0–49.7)
MCH: 29.5 pg (ref 25.1–34.0)
MCHC: 32.4 g/dL (ref 31.5–36.0)
MCV: 91 fL (ref 79.5–101.0)
MONO#: 0.4 10*3/uL (ref 0.1–0.9)
MONO%: 7.5 % (ref 0.0–14.0)
NEUT#: 2.1 10*3/uL (ref 1.5–6.5)
NEUT%: 43.7 % (ref 38.4–76.8)
Platelets: 257 10*3/uL (ref 145–400)
RBC: 4.57 10*6/uL (ref 3.70–5.45)
RDW: 13.8 % (ref 11.2–14.5)
WBC: 4.8 10*3/uL (ref 3.9–10.3)
lymph#: 2.3 10*3/uL (ref 0.9–3.3)

## 2015-05-31 LAB — GLUCOSE, CAPILLARY: Glucose-Capillary: 103 mg/dL — ABNORMAL HIGH (ref 65–99)

## 2015-05-31 NOTE — Progress Notes (Signed)
[facility] [phone]   Fax:([phone]) [phone]  OFFICE PROGRESS NOTE  Becky Sax, MD [phone] [place] [address]  DIAGNOSIS: Stage IB (T2a, N0, M0) non-small cell lung cancer consistent with moderately differentiated adenocarcinoma diagnosed in September of 2014.   PRIOR THERAPY: Bronchoscopy, right video-assisted thoracoscopy, minithoracotomy, right upper lobectomy with lymph node dissection under the care of Dr. Servando Snare on 06/16/2013. Tumor size was 3.5 CM and the myriad genetics showed high risk for recurrence.  CURRENT THERAPY: Observation.  INTERVAL HISTORY: [name] 81 y.o. female returns to the clinic today for follow-up visit. The patient is feeling fine today with no specific complaints. The patient denied having any significant weight loss or night sweats. She has no chest pain or hemoptysis. The patient denied having any significant fever or chills. She has been observation since her surgery. She was found on recent CT scan of the chest to have questionable nodule in the left lung. The patient underwent a PET scan earlier today and she is here for evaluation and discussion of her PET scan results and further recommendation regarding her condition.  MEDICAL HISTORY: Past Medical History  Diagnosis Date  . Stroke ([facility])   . Arthritis   . Hypertension   . Hyperlipidemia   . Glaucoma   . Peripheral vascular disease, unspecified ([facility])     with Claudication  . COPD GOLD I 04/18/2013  . Shortness of breath   . Cancer ([facility])     rt lung  . Lung cancer ([facility]) 06/07/2013    ALLERGIES:  is allergic to aspirin and morphine and related.  MEDICATIONS:  Current Outpatient Prescriptions  Medication Sig Dispense Refill  . acetaminophen (TYLENOL) 500 MG tablet Take 500 mg by mouth daily as needed for mild pain, moderate pain or headache.     . albuterol (PROAIR HFA) 108 (90 BASE) MCG/ACT inhaler Inhale 2 puffs into the lungs every 6  (six) hours as needed for wheezing (wheezing).     . cholecalciferol (VITAMIN D) 1000 UNITS tablet Take 1,000 Units by mouth daily.    . clopidogrel (PLAVIX) 75 MG tablet Take 75 mg by mouth 3 (three) times a week. Monday, Wednesday, friday    . losartan (COZAAR) 50 MG tablet Take 50 mg by mouth daily.     . meclizine (ANTIVERT) 25 MG tablet Take 25 mg by mouth 3 (three) times daily as needed for dizziness (dizziness). For dizziness    . omeprazole (PRILOSEC) 20 MG capsule Take 20 mg by mouth daily before breakfast.     . pravastatin (PRAVACHOL) 20 MG tablet Take 20 mg by mouth at bedtime.     Marland Kitchen PREPARATION H 0.25-88.44 % suppository   99  . RESTASIS 0.05 % ophthalmic emulsion Place 1 drop into both eyes every 12 (twelve) hours.     . Sennosides (SENNA LAX PO) Take 1-2 tablets by mouth daily as needed (constipation). For constipation    . traMADol (ULTRAM) 50 MG tablet Take 50 mg by mouth 2 (two) times daily as needed for moderate pain or severe pain (pain).    . TRAVATAN Z 0.004 % SOLN ophthalmic solution Place 1 drop into both eyes at bedtime.     . cyclobenzaprine (FLEXERIL) 10 MG tablet Take 10 mg by mouth 2 (two) times daily.     No current facility-administered medications for this visit.    SURGICAL HISTORY:  Past Surgical History  Procedure Laterality Date  .  Above knee leg amputation      Right  . Angioplasty / stenting iliac  03/22/09    Aortogram-  left common iliac artery  . Hemorrhoid surgery    . Tonsillectomy    . Colonoscopy  Feb. 2013  . Video bronchoscopy Bilateral 04/10/2013    Procedure: VIDEO BRONCHOSCOPY WITH FLUORO;  Surgeon: Tanda Rockers, MD;  Location: [facility];  Service: Cardiopulmonary;  Laterality: Bilateral;  . Video bronchoscopy N/A 06/16/2013    Procedure: VIDEO BRONCHOSCOPY;  Surgeon: [name], MD;  Location: [facility] OR;  Service: Thoracic;  Laterality: N/A;  . Video assisted thoracoscopy (vats)/wedge resection Right 06/16/2013    Procedure:  VIDEO ASSISTED THORACOSCOPY (VATS)/WEDGE RESECTION;  Surgeon: [name], MD;  Location: [facility];  Service: Thoracic;  Laterality: Right;    REVIEW OF SYSTEMS:  A comprehensive review of systems was negative.   PHYSICAL EXAMINATION: General appearance: alert, cooperative, fatigued and no distress Head: Normocephalic, without obvious abnormality, atraumatic Neck: no adenopathy, no JVD, supple, symmetrical, trachea midline and thyroid not enlarged, symmetric, no tenderness/mass/nodules Lymph nodes: Cervical, supraclavicular, and axillary nodes normal. Resp: clear to auscultation bilaterally Back: symmetric, no curvature. ROM normal. No CVA tenderness. Cardio: regular rate and rhythm, S1, S2 normal, no murmur, click, rub or gallop GI: soft, non-tender; bowel sounds normal; no masses,  no organomegaly Extremities: extremities normal, atraumatic, no cyanosis or edema  ECOG PERFORMANCE STATUS: 1 - Symptomatic but completely ambulatory  Blood pressure 168/68, pulse 75, temperature 98.2 F (36.8 C), temperature source Oral, resp. rate 20, height $RemoveBe'5\' 4"'wmHVPzKBC$  (1.626 m), weight 151 lb 4.8 oz (68.629 kg), SpO2 99 %.  LABORATORY DATA: Lab Results  Component Value Date   WBC 4.8 05/31/2015   HGB 13.5 05/31/2015   HCT 41.5 05/31/2015   MCV 91.0 05/31/2015   PLT 257 05/31/2015      Chemistry      Component Value Date/Time   NA 142 05/03/2015 0848   NA 138 03/04/2015 0908   K 4.4 05/03/2015 0848   K 4.6 03/04/2015 0908   CL 106 03/04/2015 0908   CO2 23 05/03/2015 0848   CO2 25 03/04/2015 0908   BUN 11.7 05/03/2015 0848   BUN 17 03/04/2015 0908   CREATININE 1.0 05/03/2015 0848   CREATININE 0.99 03/04/2015 0908      Component Value Date/Time   CALCIUM 9.6 05/03/2015 0848   CALCIUM 8.9 03/04/2015 0908   ALKPHOS 62 05/03/2015 0848   ALKPHOS 55 03/04/2015 0908   AST 14 05/03/2015 0848   AST 20 03/04/2015 0908   ALT 10 05/03/2015 0848   ALT 11* 03/04/2015 0908   BILITOT 0.53 05/03/2015  0848   BILITOT 0.3 03/04/2015 0908       RADIOGRAPHIC STUDIES: Ct Chest Wo Contrast  05/03/2015  CLINICAL DATA:  Subsequent encounter for right upper lobe lung cancer, status post partial resection. EXAM: CT CHEST WITHOUT CONTRAST TECHNIQUE: Multidetector CT imaging of the chest was performed following the standard protocol without IV contrast. COMPARISON:  03/04/2015.  03/11/2013. FINDINGS: Mediastinum / Lymph Nodes: There is no axillary lymphadenopathy. No mediastinal lymphadenopathy. There is no hilar lymphadenopathy. The heart size is normal. No pericardial effusion. Coronary artery calcification is noted. Lungs / Pleura: Changes of emphysema noted bilaterally. Surgical scarring with architectural distortion seen in the region of the right hilum, stable in the interval. 8 x 9 x 5 mm left lower lobe pulmonary nodule appears slightly more conspicuous than on the most recent comparison study and  has clearly increased in size when comparing back to 03/11/2013. The interval progression is well demonstrated on coronal reformations (compare image 57 of series 602 today to image 61 of series 5 on the 03/11/2013 exam. MSK / Soft Tissues: Bone windows reveal no worrisome lytic or sclerotic osseous lesions. Upper Abdomen:  Unremarkable. IMPRESSION: 1. Small spiculated left lower lobe pulmonary nodule is clearly progressive since 03/11/2013. While metastatic disease could have this appearance, a second lung primary is also a consideration. 2. Stable appearance of postsurgical changes in the right lung. These results will be called to the ordering clinician or representative by the Radiologist Assistant, and communication documented in the PACS or zVision Dashboard. Electronically Signed   By: Misty Stanley M.D.   On: 05/03/2015 10:35   Nm Pet Image Initial (pi) Skull Base To Thigh  05/31/2015  CLINICAL DATA:  Initial treatment strategy for left lower lobe pulmonary nodule. Personal history of right lung  carcinoma. EXAM: NUCLEAR MEDICINE PET SKULL BASE TO THIGH TECHNIQUE: 7.5 mCi F-18 FDG was injected intravenously. Full-ring PET imaging was performed from the skull base to thigh after the radiotracer. CT data was obtained and used for attenuation correction and anatomic localization. FASTING BLOOD GLUCOSE:  Value: 103 mg/dl COMPARISON:  CT on 05/03/2015 and PET-CT on 04/30/2013 FINDINGS: NECK No hypermetabolic lymph nodes in the neck. CHEST No hypermetabolic mediastinal or hilar nodes. Moderate emphysema and postop changes from previous left upper lobectomy demonstrated. 9 mm spiculated pulmonary nodule in the left lower lobe is hypermetabolic, with SUV max of 3.8. This is suspicious for a primary bronchogenic carcinoma. No other suspicious pulmonary nodules identified. No evidence pleural effusion. ABDOMEN/PELVIS No abnormal hypermetabolic activity within the liver, pancreas, adrenal glands, or spleen. No hypermetabolic lymph nodes in the abdomen or pelvis. SKELETON No focal hypermetabolic activity to suggest skeletal metastasis. IMPRESSION: 9 mm spiculated nodule in the left lower lobe is hypermetabolic, suspicious for primary bronchogenic carcinoma. No evidence of thoracic nodal or distant metastatic disease. Electronically Signed   By: Earle Gell M.D.   On: 05/31/2015 11:36    ASSESSMENT AND PLAN: This is a very pleasant 81 years old African-American female with history of stage IB non-small cell lung cancer, adenocarcinoma status post right upper lobectomy with lymph node dissection and has been observation since November 2014. The patient CT scan of the chest showed suspicious left lower lobe pulmonary nodule concerning for metastatic disease versus second lung primary. The PET scan performed earlier today showed hypermetabolic activity in the left lower lobe pulmonary nodule suspicious for new primary. I discussed the scan results with the patient today. I recommended for her to see Dr. Servando Snare for  further evaluation and consideration of surgical resection or biopsy followed by stereotactic radiotherapy. We will discuss her case at the weekly thoracic conference and I will arrange for the patient a follow-up appointment with me after her surgical evaluation. For hypertension, the patient was advised to take her blood pressure medication as a scheduled and also to discuss with her primary care physician adjustment of her medication. CODE STATUS: She is still full code. The patient was advised to call immediately if she has any concerning symptoms in the interval. The patient voices understanding of current disease status and treatment options and is in agreement with the current care plan.  All questions were answered. The patient knows to call the clinic with any problems, questions or concerns. We can certainly see the patient much sooner if necessary.  Disclaimer: This note  was dictated with voice recognition software. Similar sounding words can inadvertently be transcribed and may not be corrected upon review.

## 2015-06-01 ENCOUNTER — Ambulatory Visit: Payer: Medicare Other | Admitting: Podiatry

## 2015-06-03 ENCOUNTER — Ambulatory Visit (INDEPENDENT_AMBULATORY_CARE_PROVIDER_SITE_OTHER): Payer: Medicare Other | Admitting: Cardiothoracic Surgery

## 2015-06-03 ENCOUNTER — Encounter: Payer: Self-pay | Admitting: Cardiothoracic Surgery

## 2015-06-03 VITALS — BP 170/80 | HR 65 | Resp 16 | Ht 64.0 in | Wt 151.0 lb

## 2015-06-03 DIAGNOSIS — Z902 Acquired absence of lung [part of]: Secondary | ICD-10-CM

## 2015-06-03 DIAGNOSIS — C3431 Malignant neoplasm of lower lobe, right bronchus or lung: Secondary | ICD-10-CM | POA: Diagnosis not present

## 2015-06-03 NOTE — Progress Notes (Signed)
[name] Date of Birth: 1927/10/25  Tanda Rockers, MD Becky Sax, MD  Chief Complaint:   PostOp Follow Up Visit 06/16/2013  OPERATIVE REPORT  PREOPERATIVE DIAGNOSIS: Right upper lobe lung cancer.  POSTOPERATIVE DIAGNOSIS: Right upper lobe lung cancer.  SURGICAL PROCEDURE: Bronchoscopy, right video-assisted thoracoscopy,  minithoracotomy, right upper lobectomy with lymph node dissection, and  placement of On-Q device.  SURGEON: Lanelle Bal, MD  PATH: Stage IB (pT2a,pN0, cM0) ADENOCARCINOMA WITH FOCAL SARCOMATOUS DIFFERENTIATION  History of Present Illness:      Patient returns today 18 months  postop visit, considering her age of 61 years and above-the-knee amputation he is doing extremely well postoperatively. She is very motivated and is returning to near-normal activities. She denies been limited by shortness of breath has had no hemoptysis, recent CT scan of the chest was ordered by Dr. Inda Merlin. She is being evaluated for left shoulder pain   History  Smoking status  . Former Smoker -- 0.50 packs/day for 60 years  . Types: Cigarettes  . Quit date: 07/31/2009  Smokeless tobacco  . Never Used       Allergies  Allergen Reactions  . Aspirin Anaphylaxis  . Morphine And Related Other (See Comments)    confusion    Current Outpatient Prescriptions  Medication Sig Dispense Refill  . acetaminophen (TYLENOL) 500 MG tablet Take 500 mg by mouth daily as needed for mild pain, moderate pain or headache.     . albuterol (PROAIR HFA) 108 (90 BASE) MCG/ACT inhaler Inhale 2 puffs into the lungs every 6 (six) hours as needed for wheezing (wheezing).     . cholecalciferol (VITAMIN D) 1000 UNITS tablet Take 1,000 Units by mouth daily.    . clopidogrel (PLAVIX) 75 MG tablet Take 75 mg by mouth 3 (three) times a week. Monday, Wednesday, friday    . cyclobenzaprine (FLEXERIL) 10 MG tablet Take 10 mg by mouth 2 (two)  times daily.    Marland Kitchen losartan (COZAAR) 50 MG tablet Take 50 mg by mouth daily.     . meclizine (ANTIVERT) 25 MG tablet Take 25 mg by mouth 3 (three) times daily as needed for dizziness (dizziness). For dizziness    . omeprazole (PRILOSEC) 20 MG capsule Take 20 mg by mouth daily before breakfast.     . pravastatin (PRAVACHOL) 20 MG tablet Take 20 mg by mouth at bedtime.     Marland Kitchen PREPARATION H 0.25-88.44 % suppository   99  . RESTASIS 0.05 % ophthalmic emulsion Place 1 drop into both eyes every 12 (twelve) hours.     . Sennosides (SENNA LAX PO) Take 1-2 tablets by mouth daily as needed (constipation). For constipation    . traMADol (ULTRAM) 50 MG tablet Take 50 mg by mouth 2 (two) times daily as needed for moderate pain or severe pain (pain).    . TRAVATAN Z 0.004 % SOLN ophthalmic solution Place 1 drop into both eyes at bedtime.      No current facility-administered medications for this visit.       Physical Exam: BP 170/80 mmHg  Pulse 65  Resp 16  Ht '5\' 4"'$  (1.626 m)  Wt 151 lb (68.493 kg)  BMI 25.91 kg/m2  SpO2 98%  General appearance: alert, cooperative and appears stated age Neurologic: intact Heart: regular rate and rhythm, S1, S2 normal, no murmur, click, rub or gallop Lungs: clear  to auscultation bilaterally Abdomen: soft, non-tender; bowel sounds normal; no masses,  no organomegaly Extremities: No evidence of DVT in her leg, her prosthesis is in place Wound: Port sites an incision right chest are well-healed I do not appreciate cervical or supraclavicular adenopathy  Diagnostic Studies & Laboratory data:         Recent Radiology Findings: Nm Pet Image Initial (pi) Skull Base To Thigh  05/31/2015  CLINICAL DATA:  Initial treatment strategy for left lower lobe pulmonary nodule. Personal history of right lung carcinoma. EXAM: NUCLEAR MEDICINE PET SKULL BASE TO THIGH TECHNIQUE: 7.5 mCi F-18 FDG was injected intravenously. Full-ring PET imaging was performed from the skull base to  thigh after the radiotracer. CT data was obtained and used for attenuation correction and anatomic localization. FASTING BLOOD GLUCOSE:  Value: 103 mg/dl COMPARISON:  CT on 05/03/2015 and PET-CT on 04/30/2013 FINDINGS: NECK No hypermetabolic lymph nodes in the neck. CHEST No hypermetabolic mediastinal or hilar nodes. Moderate emphysema and postop changes from previous left upper lobectomy demonstrated. 9 mm spiculated pulmonary nodule in the left lower lobe is hypermetabolic, with SUV max of 3.8. This is suspicious for a primary bronchogenic carcinoma. No other suspicious pulmonary nodules identified. No evidence pleural effusion. ABDOMEN/PELVIS No abnormal hypermetabolic activity within the liver, pancreas, adrenal glands, or spleen. No hypermetabolic lymph nodes in the abdomen or pelvis. SKELETON No focal hypermetabolic activity to suggest skeletal metastasis. IMPRESSION: 9 mm spiculated nodule in the left lower lobe is hypermetabolic, suspicious for primary bronchogenic carcinoma. No evidence of thoracic nodal or distant metastatic disease. Electronically Signed   By: Earle Gell M.D.   On: 05/31/2015 11:36   Ct Chest W Contrast  10/29/2014   CLINICAL DATA:  Restaging of lung cancer. Partial right lung resection. Diagnosed in 2014. Restaging.  EXAM: CT CHEST WITH CONTRAST  TECHNIQUE: Multidetector CT imaging of the chest was performed during intravenous contrast administration.  CONTRAST:  47m OMNIPAQUE IOHEXOL 300 MG/ML  SOLN  COMPARISON:  07/27/2014  FINDINGS: Mediastinum/Nodes: Mild motion degradation throughout. No supraclavicular adenopathy. Advanced aortic and branch vessel atherosclerosis. Mild cardiomegaly, with right atrial enlargement. Multivessel coronary artery atherosclerosis. No central pulmonary embolism, on this non-dedicated study. No mediastinal or hilar adenopathy.  Lungs/Pleura: No pleural fluid. Status post right upper lobectomy. Moderate centrilobular emphysema.  Subpleural scarring in  the right lower lobe is similar.  An ill-defined left lower lobe pulmonary nodule measures 9 mm on image 28 and is similar to 1.0 cm on the prior.  Upper abdomen: Sub cm right liver lobe low-density lesion is unchanged and favored to represent a cyst. Reflux of contrast into the IVC and hepatic veins. Old granulomatous disease in the spleen. Underdistended proximal stomach. Normal imaged portions of the pancreas, gallbladder, adrenal glands, kidneys. Indeterminate left renal lesion described on the prior exam is not included.  Musculoskeletal: Right-sided thoracotomy changes.  IMPRESSION: 1. Mild motion degradation throughout. 2. Status post right upper lobectomy, without locally recurrent or nodal metastatic disease. 3. Similar 9 mm left lower lobe pulmonary nodule. Recommend attention on follow-up. 4. Cardiomegaly with advanced atherosclerosis, including within the coronary arteries. Right atrial enlargement with contrast reflux into the IVC and hepatic veins, suggesting elevated right heart pressures.   Electronically Signed   By: KAbigail MiyamotoM.D.   On: 10/29/2014 10:40   CLINICAL DATA: Restaging lung cancer  EXAM: CT CHEST WITH CONTRAST  TECHNIQUE: Multidetector CT imaging of the chest was performed during intravenous contrast administration.  CONTRAST: 843mOMNIPAQUE IOHEXOL 300  MG/ML SOLN  COMPARISON: 01/26/2014  FINDINGS: Mediastinum: No pericardial effusion. The heart size is normal. Calcified atherosclerotic plaque involves the thoracic aorta as well as the RCA and LAD coronary arteries. The trachea is patent and midline. Normal appearance of the esophagus. No mediastinal or hilar adenopathy identified.  Lungs/Pleura: No pleural effusion identified. There is no airspace consolidation or atelectasis. Status post right upper lobectomy. No evidence for locally recurrent tumor. Enlarging nodule within the left lower lobe measures 10 mm previously 7 mm. No  additional pulmonary nodules are mass is identified.  Upper Abdomen: Incidental imaging through the upper abdomen shows no acute findings. Partially visualized and potentially complex cyst involving the left kidney measures 1.5 cm, image 56/series 2. Right kidney is unremarkable peer  Musculoskeletal: Review of the visualized bony structures is negative for aggressive lytic or sclerotic bone lesion. No aggressive lytic or sclerotic bone lesions identified.  IMPRESSION: 1. No evidence for locally recurrent tumor status post right upper lobectomy. 2. Pulmonary nodule in the left lower lobe has increased in size from previous exam now measuring 10 mm. 3. Indeterminate and possibly complex cyst within the left kidney is partially visualized. Recommend further evaluation with a renal sonogram. 4. Atherosclerotic disease including multi vessel coronary artery Calcifications.  I have independently reviewed the above radiology studies  and reviewed the findings with the patient.   Electronically Signed  By: Kerby Moors M.D.  On: 07/27/2014 10:03  Ct Chest W Contrast  01/26/2014   CLINICAL DATA:  History of lung cancer diagnosed in November 2014. Status post right upper lobectomy.  EXAM: CT CHEST WITH CONTRAST  TECHNIQUE: Multidetector CT imaging of the chest was performed during intravenous contrast administration.  CONTRAST:  75m OMNIPAQUE IOHEXOL 300 MG/ML  SOLN  COMPARISON:  PET-CT 04/30/2013.  Chest CT 03/11/2013.  FINDINGS: Mediastinum: Heart size is enlarged with right atrial and right ventricular dilatation. There is no significant pericardial fluid, thickening or pericardial calcification. There is atherosclerosis of the thoracic aorta, the great vessels of the mediastinum and the coronary arteries, including calcified atherosclerotic plaque in the left main, left anterior descending, left circumflex and right coronary arteries. No pathologically enlarged mediastinal or  hilar lymph nodes. Esophagus is unremarkable in appearance.  Lungs/Pleura: Status post right upper lobectomy. Mild architectural distortion in the right lung indicative of mild postoperative scarring. Compensatory hyperexpansion of the right middle and lower lobes. 3 mm nodule in the medial aspect of the left lower lobe (image 37 of series 5). 4 mm nodule in the left lower lobe (image 31 of series 5), unchanged. No other suspicious appearing pulmonary nodules or masses. Mild diffuse bronchial wall thickening with mild centrilobular emphysema. No acute consolidative airspace disease. No pleural effusions.  Upper Abdomen: Calcified granuloma in the spleen.  Musculoskeletal: 9 mm soft tissue attenuation nodule in the lateral aspect of the right breast (image 26 of series 2), similar to prior examinations, including PET-CT 04/30/2013, at which point this was not hypermetabolic. There are no aggressive appearing lytic or blastic lesions noted in the visualized portions of the skeleton.  IMPRESSION: 1. Status post right upper lobectomy. No findings to suggest local recurrence of disease or metastatic disease in the thorax on today's examination. 2. Tiny nonspecific pulmonary nodules in the left lower lobe, as above. Attention on followup studies is recommended. 3. Cardiomegaly with right atrial and right ventricular dilatation. 4. Atherosclerosis, including left main and 3 vessel coronary artery disease. 5. Additional incidental findings, as above.   Electronically Signed  By: Vinnie Langton M.D.   On: 01/26/2014 10:34     Recent Labs: Lab Results  Component Value Date   WBC 4.8 05/31/2015   HGB 13.5 05/31/2015   HCT 41.5 05/31/2015   PLT 257 05/31/2015   GLUCOSE 95 05/31/2015   ALT 9 05/31/2015   AST 14 05/31/2015   NA 143 05/31/2015   K 4.7 05/31/2015   CL 106 03/04/2015   CREATININE 0.8 05/31/2015   BUN 7.4 05/31/2015   CO2 27 05/31/2015   INR 1.02 06/12/2013      Assessment / Plan:    Patient doing well following resection of what was clinically Stage II carcinoma the lung, but was found pathologically to be stage IB.  9 mm spiculated nodule in the left lower lobe is hypermetabolic, suspicious for primary bronchogenic carcinoma. No evidence of thoracic nodal or distant metastatic disease., The lesion in the left lung would be difficult to biopsy, case was discussed at the [facility] conference, with previous history of carcinoma the lung with the patient's age and difficulty in biopsying the lesion it was felt appropriate to proceed with stereotactic radiotherapy. The patient is not a candidate for additional lung resection.  Foundation 1 and Myrid  genetic testing was  completed and is scanned into the patient's chart. Cardiomegaly with advanced atherosclerosis, including within the coronary arteries, noted on ct scan Hypertensive  [name] 06/03/2015 3:12 PM

## 2015-06-08 ENCOUNTER — Encounter: Payer: Self-pay | Admitting: *Deleted

## 2015-06-08 DIAGNOSIS — C3431 Malignant neoplasm of lower lobe, right bronchus or lung: Secondary | ICD-10-CM

## 2015-06-08 NOTE — Progress Notes (Signed)
Oncology Nurse Navigator Documentation  Oncology Nurse Navigator Flowsheets 06/08/2015  Navigator Encounter Type Telephone/I received a phone call from [place] at Dr. Everrett Coombe office.  She stated Ms. Kresse has not been scheduled.  I stated I had notified Rad Onc on 06/04/15 to schedule.  I stated I would notify them again to schedule.  I did and cc Dr. Pablo Ledger on message.  I also follow up with patient to let her know next steps.  She was thankful for the call.   Patient Visit Type Follow-up  Treatment Phase Abnormal Scans  Interventions Coordination of Care  Coordination of Care MD Appointments  Time Spent with Patient 15

## 2015-06-14 ENCOUNTER — Encounter: Payer: Self-pay | Admitting: Radiation Oncology

## 2015-06-14 ENCOUNTER — Ambulatory Visit: Payer: Medicare Other

## 2015-06-14 ENCOUNTER — Ambulatory Visit: Admission: RE | Admit: 2015-06-14 | Payer: Medicare Other | Source: Ambulatory Visit | Admitting: Radiation Oncology

## 2015-06-14 DIAGNOSIS — Z87891 Personal history of nicotine dependence: Secondary | ICD-10-CM | POA: Insufficient documentation

## 2015-06-14 DIAGNOSIS — Z823 Family history of stroke: Secondary | ICD-10-CM | POA: Insufficient documentation

## 2015-06-14 DIAGNOSIS — Z510 Encounter for antineoplastic radiation therapy: Secondary | ICD-10-CM | POA: Insufficient documentation

## 2015-06-14 DIAGNOSIS — H409 Unspecified glaucoma: Secondary | ICD-10-CM | POA: Insufficient documentation

## 2015-06-14 DIAGNOSIS — I739 Peripheral vascular disease, unspecified: Secondary | ICD-10-CM | POA: Insufficient documentation

## 2015-06-14 DIAGNOSIS — I10 Essential (primary) hypertension: Secondary | ICD-10-CM | POA: Insufficient documentation

## 2015-06-14 DIAGNOSIS — E785 Hyperlipidemia, unspecified: Secondary | ICD-10-CM | POA: Insufficient documentation

## 2015-06-14 DIAGNOSIS — Z8673 Personal history of transient ischemic attack (TIA), and cerebral infarction without residual deficits: Secondary | ICD-10-CM | POA: Insufficient documentation

## 2015-06-14 DIAGNOSIS — Z800 Family history of malignant neoplasm of digestive organs: Secondary | ICD-10-CM | POA: Insufficient documentation

## 2015-06-14 DIAGNOSIS — J449 Chronic obstructive pulmonary disease, unspecified: Secondary | ICD-10-CM | POA: Insufficient documentation

## 2015-06-14 DIAGNOSIS — C3432 Malignant neoplasm of lower lobe, left bronchus or lung: Secondary | ICD-10-CM | POA: Insufficient documentation

## 2015-06-14 DIAGNOSIS — Z825 Family history of asthma and other chronic lower respiratory diseases: Secondary | ICD-10-CM | POA: Insufficient documentation

## 2015-06-14 NOTE — Progress Notes (Signed)
Thoracic Location of Tumor / Histology: Stage IB (T2a, N0, M0) non-small cell lung cancer consistent with moderately differentiated adenocarcinoma diagnosed in September of 2014.   Patient was found to have a questionable nodule in the left lung on routine CT.  Pathology from 06/16/2013 lobectomy revealed:   Tobacco/Marijuana/Snuff/ETOH use: former smoker; quit in 2011  Past/Anticipated interventions by cardiothoracic surgery, if any:  Bronchoscopy, right video-assisted thoracoscopy, minithoracotomy, right upper lobectomy with lymph node dissection under the care of Dr. Servando Snare on 06/16/2013.  Past/Anticipated interventions by medical oncology, if any: observation  Signs/Symptoms  Weight changes, if any: no  Respiratory complaints, if any: no  Hemoptysis, if any: no  Pain issues, if any:  no  SAFETY ISSUES:  Prior radiation? no  Pacemaker/ICD? no   Possible current pregnancy?no  Is the patient on methotrexate? no  Current Complaints / other details:  81 year old female.

## 2015-06-16 ENCOUNTER — Ambulatory Visit (INDEPENDENT_AMBULATORY_CARE_PROVIDER_SITE_OTHER): Payer: Medicare Other | Admitting: Podiatry

## 2015-06-16 ENCOUNTER — Encounter: Payer: Self-pay | Admitting: Podiatry

## 2015-06-16 DIAGNOSIS — M79675 Pain in left toe(s): Secondary | ICD-10-CM

## 2015-06-16 DIAGNOSIS — B351 Tinea unguium: Secondary | ICD-10-CM

## 2015-06-16 NOTE — Progress Notes (Signed)
Patient ID: [name], female   DOB: 22-Dec-1927, 81 y.o.   MRN: [id number]  Subjective: This patient presents today at her request complaining of painful toenails on her left foot request nail debridement  Objective: Amputation right lower extremity The toenails are elongated, hypertrophic, discolored, deformed 1-5 left No open skin lesions bilaterally Small keratoses plantar left hallux  Assessment: Symptomatic onychomycoses 1-5 keratoses 1 History of peripheral arterial disease  Plan: Debridement toenails 1-5 mechanically electronically without any bleeding Debrided keratoses 1 without a bleeding  Reappoint 3 months

## 2015-06-23 ENCOUNTER — Other Ambulatory Visit: Payer: Self-pay | Admitting: Cardiology

## 2015-06-23 DIAGNOSIS — R079 Chest pain, unspecified: Secondary | ICD-10-CM

## 2015-06-28 ENCOUNTER — Ambulatory Visit
Admission: RE | Admit: 2015-06-28 | Discharge: 2015-06-28 | Disposition: A | Discharge status: Home / Self Care | Payer: Medicare Other | Source: Ambulatory Visit | Attending: Radiation Oncology | Admitting: Radiation Oncology

## 2015-06-28 ENCOUNTER — Encounter: Payer: Self-pay | Admitting: Radiation Oncology

## 2015-06-28 VITALS — BP 177/75 | HR 72 | Resp 18 | Ht 60.0 in | Wt 159.0 lb

## 2015-06-28 DIAGNOSIS — J449 Chronic obstructive pulmonary disease, unspecified: Secondary | ICD-10-CM | POA: Diagnosis not present

## 2015-06-28 DIAGNOSIS — Z87891 Personal history of nicotine dependence: Secondary | ICD-10-CM | POA: Diagnosis not present

## 2015-06-28 DIAGNOSIS — C3432 Malignant neoplasm of lower lobe, left bronchus or lung: Secondary | ICD-10-CM

## 2015-06-28 DIAGNOSIS — Z825 Family history of asthma and other chronic lower respiratory diseases: Secondary | ICD-10-CM | POA: Diagnosis not present

## 2015-06-28 DIAGNOSIS — I739 Peripheral vascular disease, unspecified: Secondary | ICD-10-CM | POA: Diagnosis not present

## 2015-06-28 DIAGNOSIS — Z823 Family history of stroke: Secondary | ICD-10-CM | POA: Diagnosis not present

## 2015-06-28 DIAGNOSIS — Z800 Family history of malignant neoplasm of digestive organs: Secondary | ICD-10-CM | POA: Diagnosis not present

## 2015-06-28 DIAGNOSIS — E785 Hyperlipidemia, unspecified: Secondary | ICD-10-CM | POA: Diagnosis not present

## 2015-06-28 DIAGNOSIS — I10 Essential (primary) hypertension: Secondary | ICD-10-CM | POA: Diagnosis not present

## 2015-06-28 DIAGNOSIS — Z8673 Personal history of transient ischemic attack (TIA), and cerebral infarction without residual deficits: Secondary | ICD-10-CM | POA: Diagnosis not present

## 2015-06-28 DIAGNOSIS — H409 Unspecified glaucoma: Secondary | ICD-10-CM | POA: Diagnosis not present

## 2015-06-28 DIAGNOSIS — Z510 Encounter for antineoplastic radiation therapy: Secondary | ICD-10-CM | POA: Diagnosis present

## 2015-06-28 NOTE — Progress Notes (Signed)
Patient lives independently but, doesn't drive. Denies weight loss. She reports weight gain. Reports an occasional dry cough worse in the morning. Hoarseness noted. Reports occasional SOB resolves quickly with albuterol infection. Denies hemoptysis. Patient is HOH. Patient reports she recently switched from PCP, Reymundo Poll to [employer].

## 2015-06-28 NOTE — Progress Notes (Signed)
See progress note under physician encounter. 

## 2015-06-28 NOTE — Progress Notes (Signed)
Radiation Oncology         [phone] ________________________________  Initial Outpatient Consultation  Name: [name] MRN: [id number]  Date: 06/28/2015  DOB: 08/21/1927  CC:Nicole Sax, MD  Nicole Isaac, MD   REFERRING PHYSICIAN: [name], MD  DIAGNOSIS: 81 y.o. woman with a slowly enlarging 9 mm hypermetabolic left lower lung nodule and a personal history of adenocarcinoma of the right upper lung.    ICD-9-CM ICD-10-CM   1. Malignant neoplasm of lower lobe of left lung (HCC) 162.5 C34.32     HISTORY OF PRESENT ILLNESS::[name] is a 81 y.o. female who complained of right shoulder pain in July 2013. She had a chest x-ray performed on 02/15/2012 and it showed density in the apex of the right hemithorax. This was followed by observation and repeat chest x-ray on 03/05/2013 showing a right apical lesion.  CT scan of the chest on 03/11/2013 showed a spiculated right apical mass measuring 3.6 x 2.9 x 2.0 cm consistent with bronchogenic carcinoma. This lesion demonstrated no chest wall involvement. No other suspicious pulmonary nodules were noted. There was also mild centrilobular emphysema. Small AP window and right hilar lymph nodes are not pathologically enlarged. There was no pleural or pericardial effusion. The patient was referred to Dr. Servando Snare and a PET scan was performed on 05/01/2013 and it showed a hypermetabolic right upper lobe mass with right hilar nodal metastasis.  MRI of the brain on 05/14/2013 was negative for brain metastasis. On 05/29/2013, the patient underwent CT-guided core biopsy of the right upper lobe lung mass. The final pathology showed adenocarcinoma. Sections of the needle core biopsies showed an invasive non-small cell carcinoma with features of adenocarcinoma. Some of the tumor cells display clear cytoplasmic features. Confirmatory immunohistochemical stains were performed including Cytokeratin 7, Cytokeratin 20, TTF-1, Napsin A and  CDX-2 with appropriate controls. The tumor cells stain with Cytokeratin 7, Napsin A and TTF-1. The tumor cells are negative for CDX-2 and mostly negative for Cytokeratin 20. The features are compatible with adenocarcinoma of lung primary. The patient consulted with Dr. Julien Nordmann on 06/05/13. On 06/16/13, Dr. Servando Snare performed a right upper lobectomy with lymph node dissection. The tumor size was 3.5 cm and the myriad genetics showed a high risk for recurrence. Dr. Julien Nordmann decided to monitor her afterwards with repeat CT scans.  CT scan of the chest on 05/03/15 showed a 8 x 9 x 5 mm suspicious left lower lobe pulmonary nodule concerning for metastatic disease versus second lung primary that has increased in size when compared to the chest CT on 03/11/13. PET scan performed on 05/31/15 showed hypermetabolic activity in the left lower lobe pulmonary nodule suspicious for new primary. Dr. Julien Nordmann has I discussed the scan results in late October. He recommended for her to see Dr. Servando Snare for further evaluation and consideration of surgical resection or biopsy followed by stereotactic radiotherapy. She met with Dr. Servando Snare on 06/03/15, who believed that the lesion in the left lung would be difficult to biopsy and the patient is not a candidate for additional lung resection. Her case was discussed at the [facility]. The patient was kindly referred to me for the consideration of radiotherapy for the management of her disease.  PREVIOUS RADIATION THERAPY: No  PAST MEDICAL HISTORY:  has a past medical history of Stroke ([place]); Arthritis; Hypertension; Hyperlipidemia; Glaucoma; Peripheral vascular disease, unspecified ([place]); COPD GOLD I (04/18/2013); Shortness of breath; Cancer [facility]); and Lung cancer ([place]) (06/07/2013).  PAST SURGICAL HISTORY: Past Surgical History  Procedure Laterality Date  . Above knee leg amputation      Right  . Angioplasty / stenting iliac  03/22/09    Aortogram-  left common iliac artery    . Hemorrhoid surgery    . Tonsillectomy    . Colonoscopy  Feb. 2013  . Video bronchoscopy Bilateral 04/10/2013    Procedure: VIDEO BRONCHOSCOPY WITH FLUORO;  Surgeon: Tanda Rockers, MD;  Location: [facility];  Service: Cardiopulmonary;  Laterality: Bilateral;  . Video bronchoscopy N/A 06/16/2013    Procedure: VIDEO BRONCHOSCOPY;  Surgeon: Nicole Isaac, MD;  Location: [facility] OR;  Service: Thoracic;  Laterality: N/A;  . Video assisted thoracoscopy (vats)/wedge resection Right 06/16/2013    Procedure: VIDEO ASSISTED THORACOSCOPY (VATS)/WEDGE RESECTION;  Surgeon: Nicole Isaac, MD;  Location: [facility] OR;  Service: Thoracic;  Laterality: Right;    FAMILY HISTORY: family history includes Asthma in her sister; Colon cancer in her brother; Stomach cancer in her mother; Stroke in her brother.  SOCIAL HISTORY:  Social History   Social History  . Marital Status: Widowed    Spouse Name: N/A  . Number of Children: 1  . Years of Education: N/A   Occupational History  . Retired    Social History Main Topics  . Smoking status: Former Smoker -- 0.50 packs/day for 60 years    Types: Cigarettes    Quit date: 07/31/2009  . Smokeless tobacco: Never Used  . Alcohol Use: No  . Drug Use: No  . Sexual Activity: No   Other Topics Concern  . Not on file   Social History Narrative   Pt lives alone. She is HOH, especially left ear.    ALLERGIES: Aspirin and Morphine and related  MEDICATIONS:  Current Outpatient Prescriptions  Medication Sig Dispense Refill  . acetaminophen (TYLENOL) 500 MG tablet Take 500 mg by mouth daily as needed for mild pain, moderate pain or headache.     . albuterol (PROAIR HFA) 108 (90 BASE) MCG/ACT inhaler Inhale 2 puffs into the lungs every 6 (six) hours as needed for wheezing (wheezing).     Marland Kitchen amLODipine (NORVASC) 2.5 MG tablet   3  . cholecalciferol (VITAMIN D) 1000 UNITS tablet Take 1,000 Units by mouth daily.    . clopidogrel (PLAVIX) 75 MG tablet Take 75 mg by  mouth 3 (three) times a week. Monday, Wednesday, friday    . cyclobenzaprine (FLEXERIL) 10 MG tablet Take 10 mg by mouth 2 (two) times daily.    Marland Kitchen losartan (COZAAR) 50 MG tablet Take 50 mg by mouth daily.     . meclizine (ANTIVERT) 25 MG tablet Take 25 mg by mouth 3 (three) times daily as needed for dizziness (dizziness). For dizziness    . omeprazole (PRILOSEC) 20 MG capsule Take 20 mg by mouth daily before breakfast.     . pravastatin (PRAVACHOL) 20 MG tablet Take 20 mg by mouth at bedtime.     Marland Kitchen PREPARATION H 0.25-88.44 % suppository   99  . PREVNAR 13 SUSP injection TO BE ADMINISTERED BY PHARMACIST FOR IMMUNIZATION  0  . RESTASIS 0.05 % ophthalmic emulsion Place 1 drop into both eyes every 12 (twelve) hours.     . Sennosides (SENNA LAX PO) Take 1-2 tablets by mouth daily as needed (constipation). For constipation    . traMADol (ULTRAM) 50 MG tablet Take 50 mg by mouth 2 (two) times daily as needed for moderate pain or severe pain (pain).    Marland Kitchen  TRAVATAN Z 0.004 % SOLN ophthalmic solution Place 1 drop into both eyes at bedtime.      No current facility-administered medications for this encounter.    REVIEW OF SYSTEMS:  A 15 point review of systems is documented in the electronic medical record. This was obtained by the nursing staff. However, I reviewed this with the patient to discuss relevant findings and make appropriate changes.  Pertinent items are noted in HPI.   PHYSICAL EXAM:  height is 5' (1.524 m) and weight is 159 lb (72.122 kg). Her blood pressure is 177/75 and her pulse is 72. Her respiration is 18 and oxygen saturation is 99%.   Alert and oriented x3. The patient presents to the clinic in a wheelchair. She is hard of hearing. Respiratory effort is normal.  KPS = 90  100 - Normal; no complaints; no evidence of disease. 90   - Able to carry on normal activity; minor signs or symptoms of disease. 80   - Normal activity with effort; some signs or symptoms of disease. 51   - Cares  for self; unable to carry on normal activity or to do active work. 60   - Requires occasional assistance, but is able to care for most of his personal needs. 50   - Requires considerable assistance and frequent medical care. 73   - Disabled; requires special care and assistance. 22   - Severely disabled; hospital admission is indicated although death not imminent. 84   - Very sick; hospital admission necessary; active supportive treatment necessary. 10   - Moribund; fatal processes progressing rapidly. 0     - Dead  Karnofsky DA, Abelmann Derma, Craver LS and [place] JH [id number]) The use of the nitrogen mustards in the palliative treatment of carcinoma: with particular reference to bronchogenic carcinoma Cancer 1 [id number]  LABORATORY DATA:  Lab Results  Component Value Date   WBC 4.8 05/31/2015   HGB 13.5 05/31/2015   HCT 41.5 05/31/2015   MCV 91.0 05/31/2015   PLT 257 05/31/2015   Lab Results  Component Value Date   NA 143 05/31/2015   K 4.7 05/31/2015   CL 106 03/04/2015   CO2 27 05/31/2015   Lab Results  Component Value Date   ALT 9 05/31/2015   AST 14 05/31/2015   ALKPHOS 67 05/31/2015   BILITOT 0.42 05/31/2015     RADIOGRAPHY: Nm Pet Image Initial (pi) Skull Base To Thigh  05/31/2015  CLINICAL DATA:  Initial treatment strategy for left lower lobe pulmonary nodule. Personal history of right lung carcinoma. EXAM: NUCLEAR MEDICINE PET SKULL BASE TO THIGH TECHNIQUE: 7.5 mCi F-18 FDG was injected intravenously. Full-ring PET imaging was performed from the skull base to thigh after the radiotracer. CT data was obtained and used for attenuation correction and anatomic localization. FASTING BLOOD GLUCOSE:  Value: 103 mg/dl COMPARISON:  CT on 05/03/2015 and PET-CT on 04/30/2013 FINDINGS: NECK No hypermetabolic lymph nodes in the neck. CHEST No hypermetabolic mediastinal or hilar nodes. Moderate emphysema and postop changes from previous left upper lobectomy demonstrated. 9 mm spiculated  pulmonary nodule in the left lower lobe is hypermetabolic, with SUV max of 3.8. This is suspicious for a primary bronchogenic carcinoma. No other suspicious pulmonary nodules identified. No evidence pleural effusion. ABDOMEN/PELVIS No abnormal hypermetabolic activity within the liver, pancreas, adrenal glands, or spleen. No hypermetabolic lymph nodes in the abdomen or pelvis. SKELETON No focal hypermetabolic activity to suggest skeletal metastasis. IMPRESSION: 9 mm spiculated nodule in the left lower  lobe is hypermetabolic, suspicious for primary bronchogenic carcinoma. No evidence of thoracic nodal or distant metastatic disease. Electronically Signed   By: Earle Gell M.D.   On: 05/31/2015 11:36      IMPRESSION:  81 y.o. woman with a slowly enlarging 9 mm hypermetabolic left lower lung nodule and a personal history of adenocarcinoma of the right upper lung. She is not an ideal candidate for biopsy or resection. The patient may be a good candidate for SBRT without biopsy.  PLAN: Today, I talked to the patient and family about the findings and work-up thus far.  We discussed the natural history of unresectable early stage lung cancer and general treatment, highlighting the role of radiotherapy in the management.  We discussed the available radiation techniques, and focused on the details of logistics and delivery.  We reviewed the anticipated acute and late sequelae associated with radiation in this setting.  The patient was encouraged to ask questions that I answered to the best of my ability. The patient would like to proceed with radiation and will be scheduled for CT simulation on 07/02/15 at 10AM.  I spent 30 minutes minutes face to face with the patient and more than 50% of that time was spent in counseling and/or coordination of care.   ------------------------------------------------  Sheral Apley. Tammi Klippel, M.D.  This document serves as a record of services personally performed by Tyler Pita,  MD. It was created on his behalf by Darcus Austin, a trained medical scribe. The creation of this record is based on the scribe's personal observations and the provider's statements to them. This document has been checked and approved by the attending provider.

## 2015-06-30 ENCOUNTER — Encounter (HOSPITAL_COMMUNITY): Payer: Medicare Other

## 2015-06-30 ENCOUNTER — Ambulatory Visit (HOSPITAL_COMMUNITY)
Admission: RE | Admit: 2015-06-30 | Discharge: 2015-06-30 | Disposition: A | Discharge status: Home / Self Care | Payer: Medicare Other | Source: Ambulatory Visit | Attending: Cardiology | Admitting: Cardiology

## 2015-06-30 DIAGNOSIS — R079 Chest pain, unspecified: Secondary | ICD-10-CM

## 2015-06-30 DIAGNOSIS — I10 Essential (primary) hypertension: Secondary | ICD-10-CM | POA: Diagnosis not present

## 2015-06-30 DIAGNOSIS — R0602 Shortness of breath: Secondary | ICD-10-CM | POA: Insufficient documentation

## 2015-06-30 DIAGNOSIS — Z8673 Personal history of transient ischemic attack (TIA), and cerebral infarction without residual deficits: Secondary | ICD-10-CM | POA: Insufficient documentation

## 2015-06-30 MED ORDER — REGADENOSON 0.4 MG/5ML IV SOLN
INTRAVENOUS | Status: AC
  Administered 2015-06-30: 0.4 mg
  Filled 2015-06-30: qty 5

## 2015-06-30 MED ORDER — TECHNETIUM TC 99M SESTAMIBI - CARDIOLITE
30.0000 | Freq: Once | INTRAVENOUS | Status: AC | PRN
  Administered 2015-06-30: 11:00:00 30 via INTRAVENOUS

## 2015-06-30 MED ORDER — TECHNETIUM TC 99M SESTAMIBI GENERIC - CARDIOLITE
10.0000 | Freq: Once | INTRAVENOUS | Status: AC | PRN
  Administered 2015-06-30: 10 via INTRAVENOUS

## 2015-06-30 MED ORDER — REGADENOSON 0.4 MG/5ML IV SOLN: 0.4000 mg | Freq: Once | INTRAVENOUS | Status: DC

## 2015-07-01 LAB — NM MYOCAR MULTI W/SPECT W/WALL MOTION / EF
Estimated workload: 1 METS
Peak BP: 172 mmHg
Peak HR: 84 {beats}/min
Percent of predicted max HR: 62 %
Stage 1 DBP: 71 mmHg
Stage 1 Grade: 0 %
Stage 1 HR: 72 {beats}/min
Stage 1 SBP: 150 mmHg
Stage 1 Speed: 0 mph
Stage 2 Grade: 0 %
Stage 2 HR: 72 {beats}/min
Stage 2 Speed: 0 mph
Stage 3 DBP: 81 mmHg
Stage 3 Grade: 0 %
Stage 3 HR: 84 {beats}/min
Stage 3 SBP: 170 mmHg
Stage 3 Speed: 0 mph
Stage 4 DBP: 71 mmHg
Stage 4 Grade: 0 %
Stage 4 HR: 84 {beats}/min
Stage 4 SBP: 172 mmHg
Stage 4 Speed: 0 mph

## 2015-07-02 ENCOUNTER — Ambulatory Visit
Admission: RE | Admit: 2015-07-02 | Discharge: 2015-07-02 | Disposition: A | Discharge status: Home / Self Care | Payer: Medicare Other | Source: Ambulatory Visit | Attending: Radiation Oncology | Admitting: Radiation Oncology

## 2015-07-02 DIAGNOSIS — C3432 Malignant neoplasm of lower lobe, left bronchus or lung: Secondary | ICD-10-CM

## 2015-07-02 DIAGNOSIS — Z510 Encounter for antineoplastic radiation therapy: Secondary | ICD-10-CM | POA: Diagnosis not present

## 2015-07-02 NOTE — Progress Notes (Signed)
  Radiation Oncology         [phone] ________________________________  Name: [name]  MRN: [id number]  Date: 07/02/2015  DOB: 1927-10-08  STEREOTACTIC BODY RADIOTHERAPY SIMULATION AND TREATMENT PLANNING NOTE    ICD-9-CM ICD-10-CM   1. Cancer of lower lobe of left lung (HCC) 162.5 C34.32     DIAGNOSIS:  81 y.o. woman with a slowly enlarging 9 mm hypermetabolic left lower lung nodule and a personal history of adenocarcinoma of the right upper lung.  NARRATIVE:  The patient was brought to the [facility].  Identity was confirmed.  All relevant records and images related to the planned course of therapy were reviewed.  The patient freely provided informed written consent to proceed with treatment after reviewing the details related to the planned course of therapy. The consent form was witnessed and verified by the simulation staff.  Then, the patient was set-up in a stable reproducible  supine position for radiation therapy.  A BodyFix immobilization pillow was fabricated for reproducible positioning.   The patient was coached on using the DIBH bellows to hold her breath for the CT and treatment to eliminate respiratory excursion.  CT images were obtained.  Surface markings were placed.  The CT images were loaded into the planning software.  Then, using Cine, MIP, and standard views, the internal target volume (ITV) and planning target volumes (PTV) were delinieated, and avoidance structures were contoured.  Treatment planning then occurred.  The radiation prescription was entered and confirmed.  A total of two complex treatment devices were fabricated in the form of the BodyFix immobilization pillow and a neck accuform cushion.  I have requested : 3D Simulation  I have requested a DVH of the following structures: Heart, Lungs, Esophagus, Chest Wall, Brachial Plexus, Major Blood Vessels, and targets.  PLAN:  The patient will receive 54 Gy in 3  fractions.  ________________________________  Sheral Apley Tammi Klippel, M.D.  This document serves as a record of services personally performed by Tyler Pita, MD. It was created on his behalf by Arlyce Harman, a trained medical scribe. The creation of this record is based on the scribe's personal observations and the provider's statements to them. This document has been checked and approved by the attending provider.

## 2015-07-04 DIAGNOSIS — Z510 Encounter for antineoplastic radiation therapy: Secondary | ICD-10-CM | POA: Diagnosis not present

## 2015-07-12 ENCOUNTER — Encounter: Payer: Self-pay | Admitting: Radiation Oncology

## 2015-07-12 ENCOUNTER — Ambulatory Visit
Admission: RE | Admit: 2015-07-12 | Discharge: 2015-07-12 | Disposition: A | Discharge status: Home / Self Care | Payer: Medicare Other | Source: Ambulatory Visit | Attending: Radiation Oncology | Admitting: Radiation Oncology

## 2015-07-12 DIAGNOSIS — C3432 Malignant neoplasm of lower lobe, left bronchus or lung: Secondary | ICD-10-CM

## 2015-07-12 DIAGNOSIS — Z510 Encounter for antineoplastic radiation therapy: Secondary | ICD-10-CM | POA: Diagnosis not present

## 2015-07-13 ENCOUNTER — Ambulatory Visit: Payer: Medicare Other | Admitting: Radiation Oncology

## 2015-07-15 ENCOUNTER — Ambulatory Visit
Admission: RE | Admit: 2015-07-15 | Discharge: 2015-07-15 | Disposition: A | Discharge status: Home / Self Care | Payer: Medicare Other | Source: Ambulatory Visit | Attending: Radiation Oncology | Admitting: Radiation Oncology

## 2015-07-15 DIAGNOSIS — Z510 Encounter for antineoplastic radiation therapy: Secondary | ICD-10-CM | POA: Diagnosis not present

## 2015-07-19 ENCOUNTER — Ambulatory Visit
Admission: RE | Admit: 2015-07-19 | Discharge: 2015-07-19 | Disposition: A | Discharge status: Home / Self Care | Payer: Medicare Other | Source: Ambulatory Visit | Attending: Radiation Oncology | Admitting: Radiation Oncology

## 2015-07-19 ENCOUNTER — Encounter: Payer: Self-pay | Admitting: Radiation Oncology

## 2015-07-19 VITALS — BP 135/59 | HR 66 | Resp 16

## 2015-07-19 DIAGNOSIS — Z510 Encounter for antineoplastic radiation therapy: Secondary | ICD-10-CM | POA: Diagnosis not present

## 2015-07-19 DIAGNOSIS — C3432 Malignant neoplasm of lower lobe, left bronchus or lung: Secondary | ICD-10-CM

## 2015-07-19 NOTE — Progress Notes (Signed)
Department of Radiation Oncology  Phone:  [phone] Fax:        [phone]  Weekly Treatment Note    Name: [name] Date: 07/19/2015 MRN: [id number] DOB: 02-15-1928   Current dose: 54 Gy  Current fraction:3   MEDICATIONS: Current Outpatient Prescriptions  Medication Sig Dispense Refill  . acetaminophen (TYLENOL) 500 MG tablet Take 500 mg by mouth daily as needed for mild pain, moderate pain or headache.     . albuterol (PROAIR HFA) 108 (90 BASE) MCG/ACT inhaler Inhale 2 puffs into the lungs every 6 (six) hours as needed for wheezing (wheezing).     Marland Kitchen amLODipine (NORVASC) 2.5 MG tablet   3  . cholecalciferol (VITAMIN D) 1000 UNITS tablet Take 1,000 Units by mouth daily.    . clopidogrel (PLAVIX) 75 MG tablet Take 75 mg by mouth 3 (three) times a week. Monday, Wednesday, friday    . losartan (COZAAR) 50 MG tablet Take 50 mg by mouth daily.     . meclizine (ANTIVERT) 25 MG tablet Take 25 mg by mouth 3 (three) times daily as needed for dizziness (dizziness). For dizziness    . omeprazole (PRILOSEC) 20 MG capsule Take 20 mg by mouth daily before breakfast.     . pravastatin (PRAVACHOL) 20 MG tablet Take 20 mg by mouth at bedtime.     Marland Kitchen PREPARATION H 0.25-88.44 % suppository   99  . PREVNAR 13 SUSP injection TO BE ADMINISTERED BY PHARMACIST FOR IMMUNIZATION  0  . RESTASIS 0.05 % ophthalmic emulsion Place 1 drop into both eyes every 12 (twelve) hours.     . Sennosides (SENNA LAX PO) Take 1-2 tablets by mouth daily as needed (constipation). For constipation    . traMADol (ULTRAM) 50 MG tablet Take 50 mg by mouth 2 (two) times daily as needed for moderate pain or severe pain (pain).    . TRAVATAN Z 0.004 % SOLN ophthalmic solution Place 1 drop into both eyes at bedtime.     . cyclobenzaprine (FLEXERIL) 10 MG tablet Take 10 mg by mouth 2 (two) times daily. Reported on 07/19/2015     No current facility-administered medications for this encounter.     ALLERGIES: Aspirin and  Morphine and related   LABORATORY DATA:  Lab Results  Component Value Date   WBC 4.8 05/31/2015   HGB 13.5 05/31/2015   HCT 41.5 05/31/2015   MCV 91.0 05/31/2015   PLT 257 05/31/2015   Lab Results  Component Value Date   NA 143 05/31/2015   K 4.7 05/31/2015   CL 106 03/04/2015   CO2 27 05/31/2015   Lab Results  Component Value Date   ALT 9 05/31/2015   AST 14 05/31/2015   ALKPHOS 67 05/31/2015   BILITOT 0.42 05/31/2015     NARRATIVE: [name] was seen today for weekly treatment management. The chart was checked and the patient's films were reviewed. Weight and vitals stable. Denies pain. Denies skin changes within treatment field. Denies pain/difficulty associated with swallowing. Reports an occasional dry cough and shortness of breath. Denies fatigue. One month follow up appointment card given. Patient understands to contact this RN with future needs.  PHYSICAL EXAMINATION: blood pressure is 135/59 and her pulse is 66. Her respiration is 16 and oxygen saturation is 99%.        ASSESSMENT: The patient did satisfactorily with treatment. No complaints at this time with the patient finishing treatment today.  PLAN: The patient will follow-up in our clinic in  1 month with Dr. Tammi Klippel.   ------------------------------------------------  Jodelle Gross, MD, PhD  This document serves as a record of services personally performed by Kyung Rudd, MD. It was created on his behalf by Derek Mound, a trained medical scribe. The creation of this record is based on the scribe's personal observations and the provider's statements to them. This document has been checked and approved by the attending provider.

## 2015-07-19 NOTE — Progress Notes (Addendum)
Weight and vitals stable. Denies pain. Denies skin changes within treatment field. Denies pain/difficulty associated with swallowing. Reports an occasional dry cough and shortness of breath. Denies fatigue. One month follow up appointment card given. Patient understands to contact this RN with future needs.  BP 135/59 mmHg  Pulse 66  Resp 16  Wt   SpO2 99% Wt Readings from Last 3 Encounters:  06/28/15 159 lb (72.122 kg)  06/03/15 151 lb (68.493 kg)  05/31/15 151 lb 4.8 oz (68.629 kg)

## 2015-07-28 ENCOUNTER — Telehealth: Payer: Self-pay | Admitting: *Deleted

## 2015-07-28 ENCOUNTER — Telehealth: Payer: Self-pay | Admitting: Nurse Practitioner

## 2015-07-28 ENCOUNTER — Telehealth: Payer: Self-pay | Admitting: Radiation Oncology

## 2015-07-28 DIAGNOSIS — C3431 Malignant neoplasm of lower lobe, right bronchus or lung: Secondary | ICD-10-CM

## 2015-07-28 NOTE — Telephone Encounter (Signed)
Call received from patient's Senior Living assistant [name] with [facility] and patient asking for Dr. Tammi Klippel.    "I was told to call if I have any problems last week when I finished RT.  I'm having trouble.  No energy, light headed, I don't want to eat, poor appetite.  When I try to get up to move around, everything goes to my head.  I feel I could have a spell and faint.  My breathing isn't better with the inhalers.  Started two days ago.  I am drinking water (3 to 4 of the 16.9 oz bottles), soda and juice but nothing taste good so I do not eat much.  Took antivert this morning and it helps.  Takes B/P medicines.  B/P checked with this call = 140/68."  Asked about coming to [facility] for [facility] [place] but says she does not have transportation.  Three day notice required.  [name] says she can drop her off tomorrow morning.  Cannot bring her today.  P.O.F generated.  Instructed to call 911 if dizzy and feeling faint.  Advised she FF.  Sit or rest a few minutes to get her bearings before she tries to move from lying or sitting.  No further symptoms reported.

## 2015-07-28 NOTE — Telephone Encounter (Signed)
Received inbasket from [facility], RN reference this patient. Phoned patient to inquire. Patient reports shortness of breath and lack of energy. Offered for patient to be evaluated today by a [facility] rad onc physician. Patient refused because she has no transportation. Patient confirms she arranged an appointment for tomorrow with [facility]. Patient states, "I am fine to wait until then." Encouraged patient to call 911 if symptoms get worse and she verbalized understanding.

## 2015-07-28 NOTE — Telephone Encounter (Signed)
Called patient with a new appointment time as the time that roz gave her was taken by another patient

## 2015-07-29 ENCOUNTER — Other Ambulatory Visit: Payer: Medicare Other

## 2015-07-29 ENCOUNTER — Encounter: Payer: Medicare Other | Admitting: Nurse Practitioner

## 2015-07-29 NOTE — Telephone Encounter (Signed)
[name] with [facility] called reporting [name] will not be able to keep today's appointment due to transportation.  Car not working and Copy.  Can come in tomorrow.   Asked how patient is doing advising she call 911 to go to ED if feeling dizzy and faint.  [name] reports she is a little better today. P.O.F. Generated to change appointment.

## 2015-07-30 ENCOUNTER — Other Ambulatory Visit (HOSPITAL_BASED_OUTPATIENT_CLINIC_OR_DEPARTMENT_OTHER): Payer: Medicare Other

## 2015-07-30 ENCOUNTER — Ambulatory Visit (HOSPITAL_BASED_OUTPATIENT_CLINIC_OR_DEPARTMENT_OTHER): Payer: Medicare Other | Admitting: Nurse Practitioner

## 2015-07-30 VITALS — BP 122/43 | HR 63 | Temp 97.6°F | Resp 17 | Wt 150.5 lb

## 2015-07-30 DIAGNOSIS — Z85118 Personal history of other malignant neoplasm of bronchus and lung: Secondary | ICD-10-CM | POA: Diagnosis not present

## 2015-07-30 DIAGNOSIS — H8149 Vertigo of central origin, unspecified ear: Secondary | ICD-10-CM

## 2015-07-30 DIAGNOSIS — C3432 Malignant neoplasm of lower lobe, left bronchus or lung: Secondary | ICD-10-CM

## 2015-07-30 DIAGNOSIS — C3431 Malignant neoplasm of lower lobe, right bronchus or lung: Secondary | ICD-10-CM

## 2015-07-30 DIAGNOSIS — R42 Dizziness and giddiness: Secondary | ICD-10-CM

## 2015-07-30 LAB — CBC WITH DIFFERENTIAL/PLATELET
BASO%: 0.7 % (ref 0.0–2.0)
Basophils Absolute: 0 10*3/uL (ref 0.0–0.1)
EOS%: 1.1 % (ref 0.0–7.0)
Eosinophils Absolute: 0 10*3/uL (ref 0.0–0.5)
HCT: 43.8 % (ref 34.8–46.6)
HGB: 14 g/dL (ref 11.6–15.9)
LYMPH%: 41.3 % (ref 14.0–49.7)
MCH: 29.3 pg (ref 25.1–34.0)
MCHC: 31.9 g/dL (ref 31.5–36.0)
MCV: 91.7 fL (ref 79.5–101.0)
MONO#: 0.5 10*3/uL (ref 0.1–0.9)
MONO%: 11.9 % (ref 0.0–14.0)
NEUT#: 1.9 10*3/uL (ref 1.5–6.5)
NEUT%: 45 % (ref 38.4–76.8)
Platelets: 235 10*3/uL (ref 145–400)
RBC: 4.78 10*6/uL (ref 3.70–5.45)
RDW: 14.5 % (ref 11.2–14.5)
WBC: 4.2 10*3/uL (ref 3.9–10.3)
lymph#: 1.7 10*3/uL (ref 0.9–3.3)

## 2015-07-30 LAB — COMPREHENSIVE METABOLIC PANEL
ALT: 10 U/L (ref 0–55)
AST: 14 U/L (ref 5–34)
Albumin: 3.6 g/dL (ref 3.5–5.0)
Alkaline Phosphatase: 66 U/L (ref 40–150)
Anion Gap: 10 mEq/L (ref 3–11)
BUN: 15.8 mg/dL (ref 7.0–26.0)
CO2: 23 mEq/L (ref 22–29)
Calcium: 9.2 mg/dL (ref 8.4–10.4)
Chloride: 106 mEq/L (ref 98–109)
Creatinine: 1.1 mg/dL (ref 0.6–1.1)
EGFR: 56 mL/min/{1.73_m2} — ABNORMAL LOW (ref >90)
Glucose: 97 mg/dl (ref 70–140)
Potassium: 4.8 mEq/L (ref 3.5–5.1)
Sodium: 139 mEq/L (ref 136–145)
Total Bilirubin: <0.3 mg/dL (ref 0.20–1.20)
Total Protein: 7.2 g/dL (ref 6.4–8.3)

## 2015-08-03 ENCOUNTER — Other Ambulatory Visit: Payer: Self-pay | Admitting: Nurse Practitioner

## 2015-08-03 ENCOUNTER — Telehealth: Payer: Self-pay | Admitting: Radiation Oncology

## 2015-08-03 ENCOUNTER — Encounter: Payer: Self-pay | Admitting: Nurse Practitioner

## 2015-08-03 DIAGNOSIS — C3432 Malignant neoplasm of lower lobe, left bronchus or lung: Secondary | ICD-10-CM

## 2015-08-03 DIAGNOSIS — R42 Dizziness and giddiness: Secondary | ICD-10-CM | POA: Insufficient documentation

## 2015-08-03 NOTE — Assessment & Plan Note (Signed)
Patient is status post right lobectomy in 2014 per Dr. Servando Snare. Recent PET scan revealed a suspicious left lung nodule as well. Dr. Servando Snare has advised that patient is not a surgical candidate at this time. Patient then underwent radiation treatments which were completed on 07/19/2015.  Currently, patient is undergoing observation only.  While she recovers from her radiation treatments.   patient reports to the [facility] today with continued/chronic dizziness.  Patient has a history of chronic vertigo in the past.  See further notes.   Patient has no follow-up appointments here at the cancer center.  Will review this with Dr. Julien Nordmann; and see the patient needs to return.

## 2015-08-03 NOTE — Progress Notes (Signed)
SYMPTOM MANAGEMENT CLINIC   HPI: [name] 82 y.o. female diagnosed with lung cancer.  Patient is status post lobectomy and radiation treatments.  Currently undergoing observation only.   Patient is status post right lobectomy in 2014 per Dr. Servando Snare. Recent PET scan revealed a suspicious left lung nodule as well. Dr. Servando Snare has advised that patient is not a surgical candidate at this time. Patient then underwent radiation treatments which were completed on 07/19/2015.  Currently, patient is undergoing observation only.  While she recovers from her radiation treatments.   Patient reports to the [facility] today with continued/chronic dizziness.   Patient reports she has a several year history of chronic vertigo.  She has recently been evaluated in the emergency department for the same complaints as well.  Patient states that the episodes of vertigo can occur whether she is sitting, standing, or changing positions.  She can also experience the vertigo while sitting perfectly still.  She states that the sensation develops in the pit of her stomach and radiates up towards her head. She denies any nausea or vomiting.  She also denies any vision changes.  She denies any other neurological deficits whatsoever.   patient states she already has meclizine there has been prescribed 3 times daily on an as-needed basis.  She states she typically only takes the meclizine on  An intermittent basis.  HPI  ROS  Past Medical History  Diagnosis Date  . Stroke ([place])   . Arthritis   . Hypertension   . Hyperlipidemia   . Glaucoma   . Peripheral vascular disease, unspecified ([place])     with Claudication  . COPD GOLD I 04/18/2013  . Shortness of breath   . Cancer ([place])     rt lung  . Lung cancer ([place]) 06/07/2013    Past Surgical History  Procedure Laterality Date  . Above knee leg amputation      Right  . Angioplasty / stenting iliac  03/22/09    Aortogram-  left common iliac artery  .  Hemorrhoid surgery    . Tonsillectomy    . Colonoscopy  Feb. 2013  . Video bronchoscopy Bilateral 04/10/2013    Procedure: VIDEO BRONCHOSCOPY WITH FLUORO;  Surgeon: Tanda Rockers, MD;  Location: [facility];  Service: Cardiopulmonary;  Laterality: Bilateral;  . Video bronchoscopy N/A 06/16/2013    Procedure: VIDEO BRONCHOSCOPY;  Surgeon: [name], MD;  Location: [facility];  Service: Thoracic;  Laterality: N/A;  . Video assisted thoracoscopy (vats)/wedge resection Right 06/16/2013    Procedure: VIDEO ASSISTED THORACOSCOPY (VATS)/WEDGE RESECTION;  Surgeon: [name], MD;  Location: [facility];  Service: Thoracic;  Laterality: Right;    has Chest pain; Hypertension; Hyperlipidemia; Peripheral vascular disease, unspecified ([place]); Iliac artery stenosis, left ([place]); Pain in joint, lower leg; Hemoptysis; COPD GOLD I; Cancer of lower lobe of left lung ([place]); Numbness-Left Heel and Toes ; Sharp pain- Left Lateral Thigh and Right Stump; Full code status; and Vertigo on her problem list.    is allergic to aspirin and morphine and related.    Medication List       This list is accurate as of: 07/30/15 11:59 PM.  Always use your most recent med list.               acetaminophen 500 MG tablet  Commonly known as:  TYLENOL  Take 100 mg by mouth daily as needed for mild pain, moderate pain or headache.     amLODipine  2.5 MG tablet  Commonly known as:  NORVASC     cholecalciferol 1000 units tablet  Commonly known as:  VITAMIN D  Take 1,000 Units by mouth daily.     clopidogrel 75 MG tablet  Commonly known as:  PLAVIX  Take 75 mg by mouth 3 (three) times a week. Monday, Wednesday, friday     cyclobenzaprine 10 MG tablet  Commonly known as:  FLEXERIL  Take 10 mg by mouth 2 (two) times daily. Reported on 07/19/2015     losartan 50 MG tablet  Commonly known as:  COZAAR  Take 50 mg by mouth daily.     meclizine 25 MG tablet  Commonly known as:  ANTIVERT  Take 25 mg by mouth 3  (three) times daily as needed for dizziness (dizziness). For dizziness     omeprazole 20 MG capsule  Commonly known as:  PRILOSEC  Take 20 mg by mouth daily before breakfast.     pravastatin 20 MG tablet  Commonly known as:  PRAVACHOL  Take 20 mg by mouth at bedtime.     PREPARATION H 0.25-88.44 % suppository  Generic drug:  shark liver oil-cocoa butter     PREVNAR 13 Susp injection  Generic drug:  pneumococcal 13-valent conjugate vaccine  TO BE ADMINISTERED BY PHARMACIST FOR IMMUNIZATION     PROAIR HFA 108 (90 Base) MCG/ACT inhaler  Generic drug:  albuterol  Inhale 2 puffs into the lungs every 6 (six) hours as needed for wheezing (wheezing).     RESTASIS 0.05 % ophthalmic emulsion  Generic drug:  cycloSPORINE  Place 1 drop into both eyes every 12 (twelve) hours.     SENNA LAX PO  Take 1-2 tablets by mouth daily as needed (constipation). For constipation     traMADol 50 MG tablet  Commonly known as:  ULTRAM  Take 50 mg by mouth 2 (two) times daily as needed for moderate pain or severe pain (pain).     TRAVATAN Z 0.004 % Soln ophthalmic solution  Generic drug:  Travoprost (BAK Free)  Place 1 drop into both eyes at bedtime.         PHYSICAL EXAMINATION  Oncology Vitals 07/30/2015 07/19/2015  Height - -  Weight 68.266 kg (No Data)  Weight (lbs) 150 lbs 8 oz (No Data)  BMI (kg/m2) - -  Temp 97.6 -  Pulse 63 66  Resp 17 16  SpO2 99 99  BSA (m2) - -  Some encounter information is confidential and restricted. Go to Review Flowsheets activity to see all data.   BP Readings from Last 2 Encounters:  07/30/15 122/43  07/19/15 135/59    Physical Exam  Constitutional: She is oriented to person, place, and time and well-developed, well-nourished, and in no distress.  HENT:  Head: Normocephalic and atraumatic.  Mouth/Throat: Oropharynx is clear and moist.  Eyes: Conjunctivae and EOM are normal. Pupils are equal, round, and reactive to light. Right eye exhibits no  discharge. Left eye exhibits no discharge. No scleral icterus.  Neck: Normal range of motion. Neck supple. No JVD present. No tracheal deviation present. No thyromegaly present.  Cardiovascular: Normal rate, regular rhythm, normal heart sounds and intact distal pulses.   Pulmonary/Chest: Effort normal and breath sounds normal. No respiratory distress. She has no wheezes. She has no rales. She exhibits no tenderness.  Abdominal: Soft. Bowel sounds are normal. She exhibits no distension and no mass. There is no tenderness. There is no rebound and no guarding.  Musculoskeletal: Normal  range of motion. She exhibits no edema or tenderness.  Lymphadenopathy:    She has no cervical adenopathy.  Neurological: She is alert and oriented to person, place, and time. Gait normal.  Skin: Skin is warm and dry. No rash noted. No erythema. No pallor.  Psychiatric: Affect normal.  Nursing note and vitals reviewed.   LABORATORY DATA:. Appointment on 07/30/2015  Component Date Value Ref Range Status  . WBC 07/30/2015 4.2  3.9 - 10.3 10e3/uL Final  . NEUT# 07/30/2015 1.9  1.5 - 6.5 10e3/uL Final  . HGB 07/30/2015 14.0  11.6 - 15.9 g/dL Final  . HCT 07/30/2015 43.8  34.8 - 46.6 % Final  . Platelets 07/30/2015 235  145 - 400 10e3/uL Final  . MCV 07/30/2015 91.7  79.5 - 101.0 fL Final  . MCH 07/30/2015 29.3  25.1 - 34.0 pg Final  . MCHC 07/30/2015 31.9  31.5 - 36.0 g/dL Final  . RBC 07/30/2015 4.78  3.70 - 5.45 10e6/uL Final  . RDW 07/30/2015 14.5  11.2 - 14.5 % Final  . lymph# 07/30/2015 1.7  0.9 - 3.3 10e3/uL Final  . MONO# 07/30/2015 0.5  0.1 - 0.9 10e3/uL Final  . Eosinophils Absolute 07/30/2015 0.0  0.0 - 0.5 10e3/uL Final  . Basophils Absolute 07/30/2015 0.0  0.0 - 0.1 10e3/uL Final  . NEUT% 07/30/2015 45.0  38.4 - 76.8 % Final  . LYMPH% 07/30/2015 41.3  14.0 - 49.7 % Final  . MONO% 07/30/2015 11.9  0.0 - 14.0 % Final  . EOS% 07/30/2015 1.1  0.0 - 7.0 % Final  . BASO% 07/30/2015 0.7  0.0 - 2.0 %  Final  . Sodium 07/30/2015 139  136 - 145 mEq/L Final  . Potassium 07/30/2015 4.8  3.5 - 5.1 mEq/L Final  . Chloride 07/30/2015 106  98 - 109 mEq/L Final  . CO2 07/30/2015 23  22 - 29 mEq/L Final  . Glucose 07/30/2015 97  70 - 140 mg/dl Final   Glucose reference range is for nonfasting patients. Fasting glucose reference range is 70- 100.  Marland Kitchen BUN 07/30/2015 15.8  7.0 - 26.0 mg/dL Final  . Creatinine 07/30/2015 1.1  0.6 - 1.1 mg/dL Final  . Total Bilirubin 07/30/2015 <0.30  0.20 - 1.20 mg/dL Final  . Alkaline Phosphatase 07/30/2015 66  40 - 150 U/L Final  . AST 07/30/2015 14  5 - 34 U/L Final  . ALT 07/30/2015 10  0 - 55 U/L Final  . Total Protein 07/30/2015 7.2  6.4 - 8.3 g/dL Final  . Albumin 07/30/2015 3.6  3.5 - 5.0 g/dL Final  . Calcium 07/30/2015 9.2  8.4 - 10.4 mg/dL Final  . Anion Gap 07/30/2015 10  3 - 11 mEq/L Final  . EGFR 07/30/2015 56* >90 ml/min/1.73 m2 Final   eGFR is calculated using the CKD-EPI Creatinine Equation (2009)     RADIOGRAPHIC STUDIES: No results found.  ASSESSMENT/PLAN:    Cancer of lower lobe of left lung [facility])  Patient is status post right lobectomy in 2014 per Dr. Servando Snare. Recent PET scan revealed a suspicious left lung nodule as well. Dr. Servando Snare has advised that patient is not a surgical candidate at this time. Patient then underwent radiation treatments which were completed on 07/19/2015.  Currently, patient is undergoing observation only.  While she recovers from her radiation treatments.   patient reports to the [facility] today with continued/chronic dizziness.  Patient has a history of chronic vertigo in the past.  See further notes.  Patient has no follow-up appointments here at the cancer center.  Will review this with Dr. Julien Nordmann; and see the patient needs to return.  Vertigo  Patient is status post right lobectomy in 2014 per Dr. Servando Snare. Recent PET scan revealed a suspicious left lung nodule as well. Dr. Servando Snare has advised that  patient is not a surgical candidate at this time. Patient then underwent radiation treatments which were completed on 07/19/2015.  Currently, patient is undergoing observation only.  While she recovers from her radiation treatments.   Patient reports to the [facility] today with continued/chronic dizziness.   Patient reports she has a several year history of chronic vertigo.  She has recently been evaluated in the emergency department for the same complaints as well.  Patient states that the episodes of vertigo can occur whether she is sitting, standing, or changing positions.  She can also experience the vertigo while sitting perfectly still.  She states that the sensation develops in the pit of her stomach and radiates up towards her head. She denies any nausea or vomiting.  She also denies any vision changes.  She denies any other neurological deficits whatsoever.   patient states she already has meclizine there has been prescribed 3 times daily on an as-needed basis.  She states she typically only takes the meclizine on  An intermittent basis.   Exam today revealed no neurological deficits whatsoever. Patient was able to ambulate with no difficulty.   Patient was advised to continue taking the meclizine as previously directed.  She was also encouraged to follow-up with her primary care provider as well.  Patient was advised to go directly to the emergency department for any worsening symptoms whatsoever.   Patient has no follow-up appointments here at the cancer center.  Will review this with Dr. Julien Nordmann; and see the patient needs to return.  Patient stated understanding of all instructions; and was in agreement with this plan of care. The patient knows to call the clinic with any problems, questions or concerns.   Review/collaboration with Dr. Julien Nordmann regarding all aspects of patient's visit today.   Total time spent with patient was 25 minutes;  with greater than 75 percent of that time  spent in face to face counseling regarding patient's symptoms,  and coordination of care and follow up.  Disclaimer:This dictation was prepared with Dragon/digital dictation along with [employer]. Any transcriptional errors that result from this process are unintentional.  Drue Second, NP 08/03/2015

## 2015-08-03 NOTE — Assessment & Plan Note (Signed)
Patient is status post right lobectomy in 2014 per Dr. Servando Snare. Recent PET scan revealed a suspicious left lung nodule as well. Dr. Servando Snare has advised that patient is not a surgical candidate at this time. Patient then underwent radiation treatments which were completed on 07/19/2015.  Currently, patient is undergoing observation only.  While she recovers from her radiation treatments.   Patient reports to the [facility] today with continued/chronic dizziness.   Patient reports she has a several year history of chronic vertigo.  She has recently been evaluated in the emergency department for the same complaints as well.  Patient states that the episodes of vertigo can occur whether she is sitting, standing, or changing positions.  She can also experience the vertigo while sitting perfectly still.  She states that the sensation develops in the pit of her stomach and radiates up towards her head. She denies any nausea or vomiting.  She also denies any vision changes.  She denies any other neurological deficits whatsoever.   patient states she already has meclizine there has been prescribed 3 times daily on an as-needed basis.  She states she typically only takes the meclizine on  An intermittent basis.   Exam today revealed no neurological deficits whatsoever. Patient was able to ambulate with no difficulty.   Patient was advised to continue taking the meclizine as previously directed.  She was also encouraged to follow-up with her primary care provider as well.  Patient was advised to go directly to the emergency department for any worsening symptoms whatsoever.   Patient has no follow-up appointments here at the cancer center.  Will review this with Dr. Julien Nordmann; and see the patient needs to return.

## 2015-08-03 NOTE — Telephone Encounter (Addendum)
Phoned patient to inquire if she is feeling better. No answer. No option to leave message on home phone. Left message on patient's cell phone.

## 2015-08-04 NOTE — Progress Notes (Signed)
  Radiation Oncology         [phone] ________________________________  Name: [name] MRN: [id number]  Date: 07/12/2015  DOB: Oct 30, 1927  End of Treatment Note  Diagnosis:   81 y.o. woman with a slowly enlarging 9 mm hypermetabolic left lower lung nodule and a personal history of adenocarcinoma of the right upper lung.     Indication for treatment:  Curative SBRT       Radiation treatment dates:   12/12, 12/15, and 07/19/15  Site/dose:   The LLL nodule was treated to 54 Gy in 3 fractions of 18 Gy  Beams/energy:   Two dynamic conformal arcs were treated with CT guidance and 10 MV photons in a full body BodyFix immobilization  Narrative: The patient tolerated radiation treatment relatively well.     Plan: The patient has completed radiation treatment. The patient will return to radiation oncology clinic for routine followup in one month. I advised her to call or return sooner if she has any questions or concerns related to her recovery or treatment. ________________________________  Sheral Apley. Tammi Klippel, M.D.

## 2015-08-16 ENCOUNTER — Telehealth: Payer: Self-pay | Admitting: Internal Medicine

## 2015-08-16 NOTE — Telephone Encounter (Signed)
cld & spoke to pt and gave pt time & date of appt for 3/6 & 3/9-adv pt that [facility] will call to [facility] scan

## 2015-08-26 ENCOUNTER — Encounter: Payer: Self-pay | Admitting: Radiation Oncology

## 2015-08-26 ENCOUNTER — Telehealth: Payer: Self-pay | Admitting: *Deleted

## 2015-08-26 ENCOUNTER — Ambulatory Visit
Admission: RE | Admit: 2015-08-26 | Discharge: 2015-08-26 | Disposition: A | Discharge status: Home / Self Care | Payer: Medicare Other | Source: Ambulatory Visit | Attending: Radiation Oncology | Admitting: Radiation Oncology

## 2015-08-26 VITALS — BP 145/79 | HR 73 | Resp 20 | Wt 149.3 lb

## 2015-08-26 DIAGNOSIS — C3432 Malignant neoplasm of lower lobe, left bronchus or lung: Secondary | ICD-10-CM

## 2015-08-26 NOTE — Progress Notes (Addendum)
Weight and vitals stable. Report left shoulder pain continues for which she manages with Tramadol. Reports intermittent dizziness which she associates with medication. Scheduled to see orthopedic tomorrow for her right leg. Reports a dry cough that is less frequent. SOB with exertion. Reports intermittent pain with swallowing. Reports intermittent hoarseness.Reports vomiting has improved. Denies fatigue.   BP 145/79 mmHg  Pulse 73  Resp 20  Wt 149 lb 4.8 oz (67.722 kg)  SpO2 98% Wt Readings from Last 3 Encounters:  08/26/15 149 lb 4.8 oz (67.722 kg)  07/30/15 150 lb 8 oz (68.266 kg)  06/28/15 159 lb (72.122 kg)

## 2015-08-26 NOTE — Progress Notes (Signed)
Radiation Oncology         [phone] ________________________________  Name: [name]  MRN: [id number]  Date: 08/26/2015  DOB: 07/20/1928  Follow-Up Visit Note  CC: Becky Sax, MD  [name], MD  Diagnosis:   82 y.o. woman with a 9 mm presumed non-small cell carcinoma of the left lower lung nodule and a personal history of adenocarcinoma of the right upper lung.  Interval Since Last Radiation:  1 month; 07/19/2015  Narrative:  The patient returns today for routine follow-up. Weight and vitals stable. Patient reports that left shoulder pain continues for which she manages with Tramadol. Reports intermittent dizziness which she associates with medication. Scheduled to see orthopedic tomorrow for her right leg. Reports a dry cough that is less frequent. SOB with exertion. Reports intermittent pain with swallowing. Reports intermittent hoarseness. Reports poor appetite. Reports heavy phlegm on one side of the throat. Reports vomiting has improved. Denies fatigue.                            ALLERGIES:  is allergic to aspirin and morphine and related.  Meds: Current Outpatient Prescriptions  Medication Sig Dispense Refill  . acetaminophen (TYLENOL) 500 MG tablet Take 100 mg by mouth daily as needed for mild pain, moderate pain or headache.     . albuterol (PROAIR HFA) 108 (90 BASE) MCG/ACT inhaler Inhale 2 puffs into the lungs every 6 (six) hours as needed for wheezing (wheezing).     Marland Kitchen amLODipine (NORVASC) 2.5 MG tablet   3  . cholecalciferol (VITAMIN D) 1000 UNITS tablet Take 1,000 Units by mouth daily.    . clopidogrel (PLAVIX) 75 MG tablet Take 75 mg by mouth 3 (three) times a week. Monday, Wednesday, friday    . cyclobenzaprine (FLEXERIL) 10 MG tablet Take 10 mg by mouth 2 (two) times daily. Reported on 07/19/2015    . losartan (COZAAR) 50 MG tablet Take 50 mg by mouth daily.     . meclizine (ANTIVERT) 25 MG tablet Take 25 mg by mouth 3 (three) times daily as needed  for dizziness (dizziness). For dizziness    . omeprazole (PRILOSEC) 20 MG capsule Take 20 mg by mouth daily before breakfast.     . pravastatin (PRAVACHOL) 20 MG tablet Take 20 mg by mouth at bedtime.     Marland Kitchen PREPARATION H 0.25-88.44 % suppository   99  . PREVNAR 13 SUSP injection TO BE ADMINISTERED BY PHARMACIST FOR IMMUNIZATION  0  . RESTASIS 0.05 % ophthalmic emulsion Place 1 drop into both eyes every 12 (twelve) hours.     . Sennosides (SENNA LAX PO) Take 1-2 tablets by mouth daily as needed (constipation). For constipation    . traMADol (ULTRAM) 50 MG tablet Take 50 mg by mouth 2 (two) times daily as needed for moderate pain or severe pain (pain).    . TRAVATAN Z 0.004 % SOLN ophthalmic solution Place 1 drop into both eyes at bedtime.      No current facility-administered medications for this encounter.    Physical Findings: The patient is in no acute distress. Patient is alert and oriented.  weight is 149 lb 4.8 oz (67.722 kg). Her blood pressure is 145/79 and her pulse is 73. Her respiration is 20 and oxygen saturation is 98%.  No significant changes.  Lab Findings: Lab Results  Component Value Date   WBC 4.2 07/30/2015   WBC 4.6 03/04/2015  HGB 14.0 07/30/2015   HGB 13.6 03/04/2015   HCT 43.8 07/30/2015   HCT 42.4 03/04/2015   PLT 235 07/30/2015   PLT 264 03/04/2015    Lab Results  Component Value Date   NA 139 07/30/2015   NA 138 03/04/2015   K 4.8 07/30/2015   K 4.6 03/04/2015   CHLORIDE 106 07/30/2015   CO2 23 07/30/2015   CO2 25 03/04/2015   GLUCOSE 97 07/30/2015   GLUCOSE 122* 03/04/2015   BUN 15.8 07/30/2015   BUN 17 03/04/2015   CREATININE 1.1 07/30/2015   CREATININE 0.99 03/04/2015   BILITOT <0.30 07/30/2015   BILITOT 0.3 03/04/2015   ALKPHOS 66 07/30/2015   ALKPHOS 55 03/04/2015   AST 14 07/30/2015   AST 20 03/04/2015   ALT 10 07/30/2015   ALT 11* 03/04/2015   PROT 7.2 07/30/2015   PROT 7.0 03/04/2015   ALBUMIN 3.6 07/30/2015   ALBUMIN 3.5  03/04/2015   CALCIUM 9.2 07/30/2015   CALCIUM 8.9 03/04/2015   ANIONGAP 10 07/30/2015   ANIONGAP 7 03/04/2015    Radiographic Findings: No results found.  Impression:  The patient is recovering from the effects of radiation.    Plan:  Will complete CT scan at the end of February. Will follow up after CT scan.   _____________________________________  Sheral Apley. Tammi Klippel, M.D.  This document serves as a record of services personally performed by Tyler Pita, MD. It was created on his behalf by Jenell Milliner, a trained medical scribe. The creation of this record is based on the scribe's personal observations and the provider's statements to them. This document has been checked and approved by the attending provider.

## 2015-08-26 NOTE — Telephone Encounter (Signed)
CALLED PATIENT TO INFORM OF SCAN AND FU, LVM FOR A RETURN CALL 

## 2015-09-15 ENCOUNTER — Ambulatory Visit (INDEPENDENT_AMBULATORY_CARE_PROVIDER_SITE_OTHER): Payer: Medicare Other | Admitting: Podiatry

## 2015-09-15 ENCOUNTER — Encounter: Payer: Self-pay | Admitting: Podiatry

## 2015-09-15 DIAGNOSIS — M79675 Pain in left toe(s): Secondary | ICD-10-CM

## 2015-09-15 DIAGNOSIS — B351 Tinea unguium: Secondary | ICD-10-CM

## 2015-09-15 NOTE — Progress Notes (Signed)
Patient ID: [name], female   DOB: 12-22-1927, 82 y.o.   MRN: [id number]  Subjective: This patient presents today at her request complaining of painful toenails on her left foot request nail debridement  Objective: Amputation right lower extremity The toenails are elongated, hypertrophic, discolored, deformed 1-5 left No open skin lesions bilaterally Small keratoses plantar left hallux  Assessment: Symptomatic onychomycoses 1-5 keratoses 1 History of peripheral arterial disease  Plan: Debridement toenails 1-5 mechanically electronically without any bleeding Debrided keratoses 1 without any bleeding  Reappoint 3 months

## 2015-09-27 ENCOUNTER — Encounter: Payer: Self-pay | Admitting: Radiation Oncology

## 2015-09-28 ENCOUNTER — Ambulatory Visit (HOSPITAL_COMMUNITY)
Admission: RE | Admit: 2015-09-28 | Discharge: 2015-09-28 | Disposition: A | Discharge status: Home / Self Care | Payer: Medicare Other | Source: Ambulatory Visit | Attending: Radiation Oncology | Admitting: Radiation Oncology

## 2015-09-28 DIAGNOSIS — C3432 Malignant neoplasm of lower lobe, left bronchus or lung: Secondary | ICD-10-CM | POA: Diagnosis not present

## 2015-09-29 ENCOUNTER — Telehealth: Payer: Self-pay | Admitting: Radiation Oncology

## 2015-09-29 NOTE — Telephone Encounter (Signed)
Per Dr. Johny Shears order phoned patient with scan results. Patient verbalized understanding and expressed appreciation for the call. Patient wishes to keep follow up appointment as scheduled for next week.

## 2015-09-29 NOTE — Telephone Encounter (Signed)
-----   Message from Tyler Pita, MD sent at 09/28/2015  4:51 PM EST ----- Tumor slightly smaller, no new abnormalities.  Looks good.  Repeat CT in 6 months.  Can keep or cancel next visit this week, if you want to discuss with patient.

## 2015-09-30 ENCOUNTER — Ambulatory Visit: Admission: RE | Admit: 2015-09-30 | Payer: Medicare Other | Source: Ambulatory Visit | Admitting: Radiation Oncology

## 2015-09-30 HISTORY — DX: Personal history of irradiation: Z92.3

## 2015-10-04 ENCOUNTER — Other Ambulatory Visit (HOSPITAL_BASED_OUTPATIENT_CLINIC_OR_DEPARTMENT_OTHER): Payer: Medicare Other

## 2015-10-04 DIAGNOSIS — C3432 Malignant neoplasm of lower lobe, left bronchus or lung: Secondary | ICD-10-CM

## 2015-10-04 LAB — COMPREHENSIVE METABOLIC PANEL
ALT: <9 U/L (ref 0–55)
AST: 12 U/L (ref 5–34)
Albumin: 3.4 g/dL — ABNORMAL LOW (ref 3.5–5.0)
Alkaline Phosphatase: 61 U/L (ref 40–150)
Anion Gap: 10 mEq/L (ref 3–11)
BUN: 14.1 mg/dL (ref 7.0–26.0)
CO2: 19 mEq/L — ABNORMAL LOW (ref 22–29)
Calcium: 8.9 mg/dL (ref 8.4–10.4)
Chloride: 110 mEq/L — ABNORMAL HIGH (ref 98–109)
Creatinine: 1.1 mg/dL (ref 0.6–1.1)
EGFR: 52 mL/min/{1.73_m2} — ABNORMAL LOW (ref >90)
Glucose: 105 mg/dl (ref 70–140)
Potassium: 4.5 mEq/L (ref 3.5–5.1)
Sodium: 139 mEq/L (ref 136–145)
Total Bilirubin: 0.38 mg/dL (ref 0.20–1.20)
Total Protein: 6.8 g/dL (ref 6.4–8.3)

## 2015-10-04 LAB — CBC WITH DIFFERENTIAL/PLATELET
BASO%: 0.2 % (ref 0.0–2.0)
Basophils Absolute: 0 10*3/uL (ref 0.0–0.1)
EOS%: 0.9 % (ref 0.0–7.0)
Eosinophils Absolute: 0 10*3/uL (ref 0.0–0.5)
HCT: 41.8 % (ref 34.8–46.6)
HGB: 13.6 g/dL (ref 11.6–15.9)
LYMPH%: 42 % (ref 14.0–49.7)
MCH: 29.4 pg (ref 25.1–34.0)
MCHC: 32.5 g/dL (ref 31.5–36.0)
MCV: 90.5 fL (ref 79.5–101.0)
MONO#: 0.4 10*3/uL (ref 0.1–0.9)
MONO%: 9 % (ref 0.0–14.0)
NEUT#: 2 10*3/uL (ref 1.5–6.5)
NEUT%: 47.9 % (ref 38.4–76.8)
Platelets: 266 10*3/uL (ref 145–400)
RBC: 4.62 10*6/uL (ref 3.70–5.45)
RDW: 14.4 % (ref 11.2–14.5)
WBC: 4.2 10*3/uL (ref 3.9–10.3)
lymph#: 1.8 10*3/uL (ref 0.9–3.3)

## 2015-10-07 ENCOUNTER — Telehealth: Payer: Self-pay | Admitting: Internal Medicine

## 2015-10-07 ENCOUNTER — Ambulatory Visit
Admission: RE | Admit: 2015-10-07 | Discharge: 2015-10-07 | Disposition: A | Discharge status: Home / Self Care | Payer: Medicare Other | Source: Ambulatory Visit | Attending: Radiation Oncology | Admitting: Radiation Oncology

## 2015-10-07 ENCOUNTER — Ambulatory Visit (HOSPITAL_BASED_OUTPATIENT_CLINIC_OR_DEPARTMENT_OTHER): Payer: Medicare Other | Admitting: Internal Medicine

## 2015-10-07 ENCOUNTER — Encounter: Payer: Self-pay | Admitting: Internal Medicine

## 2015-10-07 ENCOUNTER — Encounter: Payer: Self-pay | Admitting: Radiation Oncology

## 2015-10-07 VITALS — BP 126/45 | HR 64 | Temp 97.6°F | Resp 17 | Ht 60.0 in | Wt 149.8 lb

## 2015-10-07 VITALS — BP 126/56 | HR 55 | Temp 97.7°F | Resp 16 | Ht 60.0 in | Wt 151.7 lb

## 2015-10-07 DIAGNOSIS — Z85118 Personal history of other malignant neoplasm of bronchus and lung: Secondary | ICD-10-CM | POA: Diagnosis not present

## 2015-10-07 DIAGNOSIS — I10 Essential (primary) hypertension: Secondary | ICD-10-CM | POA: Diagnosis not present

## 2015-10-07 DIAGNOSIS — C3432 Malignant neoplasm of lower lobe, left bronchus or lung: Secondary | ICD-10-CM

## 2015-10-07 NOTE — Progress Notes (Signed)
Radiation Oncology         [phone] ________________________________  Name: [name]  MRN: [id number]  Date: 10/07/2015  DOB: 1927/08/18  Follow-Up Visit Note  CC: Becky Sax, MD  [name], MD  Diagnosis:   82 y.o. woman with a 9 mm presumed non-small cell carcinoma of the left lower lung nodule and a personal history of adenocarcinoma of the right upper lung.  Interval Since Last Radiation:  3 months; 07/19/2015 Indication for treatment: Curative SBRT  Radiation treatment dates: 12/12, 12/15, and 07/19/15 Site/dose: The LLL nodule was treated to 54 Gy in 3 fractions of 18 Gy   Narrative:  The patient returns today for routine follow-up after undergoing a post treatment CT scan on 09/28/15 which showed improvement of her left lower lobe lesion from 9 mm  to 8 mm.  On review of systems, the patient reports she is doing well. She denies any shortness of breath at rest but does experience some shortness of breath with exertion. This is unchanged from prior to her treatment. She denies any chest pain. She is planning to meet with another orthotics specialist for her prosthesis of her right lower extremity. She states that her primary care provider made some changes to her medications. She believes that this was for her blood pressure. No other complaints or verbalized.                             ALLERGIES:  is allergic to aspirin and morphine and related.  Meds: Current Outpatient Prescriptions  Medication Sig Dispense Refill  . acetaminophen (TYLENOL) 500 MG tablet Take 100 mg by mouth daily as needed for mild pain, moderate pain or headache.     . albuterol (PROAIR HFA) 108 (90 BASE) MCG/ACT inhaler Inhale 2 puffs into the lungs every 6 (six) hours as needed for wheezing (wheezing).     Marland Kitchen amLODipine (NORVASC) 2.5 MG tablet   3  . cholecalciferol (VITAMIN D) 1000 UNITS tablet Take 1,000 Units by mouth daily.    . clopidogrel (PLAVIX) 75 MG tablet Take  75 mg by mouth 3 (three) times a week. Monday, Wednesday, friday    . losartan (COZAAR) 50 MG tablet Take 50 mg by mouth daily.     . meclizine (ANTIVERT) 25 MG tablet Take 25 mg by mouth 3 (three) times daily as needed for dizziness (dizziness). For dizziness    . omeprazole (PRILOSEC) 20 MG capsule Take 20 mg by mouth daily before breakfast.     . pravastatin (PRAVACHOL) 20 MG tablet Take 20 mg by mouth at bedtime.     Marland Kitchen PREPARATION H 0.25-88.44 % suppository   99  . PREVNAR 13 SUSP injection TO BE ADMINISTERED BY PHARMACIST FOR IMMUNIZATION  0  . RESTASIS 0.05 % ophthalmic emulsion Place 1 drop into both eyes every 12 (twelve) hours.     . Sennosides (SENNA LAX PO) Take 1-2 tablets by mouth daily as needed (constipation). For constipation    . traMADol (ULTRAM) 50 MG tablet Take 50 mg by mouth 2 (two) times daily as needed for moderate pain or severe pain (pain).    . TRAVATAN Z 0.004 % SOLN ophthalmic solution Place 1 drop into both eyes at bedtime.     . cyclobenzaprine (FLEXERIL) 10 MG tablet Take 10 mg by mouth 2 (two) times daily. Reported on 07/19/2015     No current facility-administered medications for this encounter.  Physical Findings:  height is 5' (1.524 m) and weight is 151 lb 11.2 oz (68.811 kg). Her temperature is 97.7 F (36.5 C). Her blood pressure is 126/56 and her pulse is 55. Her respiration is 16 and oxygen saturation is 100%.  In general this is a well appearing African-American female in no acute distress. She's alert and oriented x4 and appropriate throughout the examination. Cardiopulmonary assessment is negative for acute distress and she exhibits normal effort.   Lab Findings: Lab Results  Component Value Date   WBC 4.2 10/04/2015   WBC 4.6 03/04/2015   HGB 13.6 10/04/2015   HGB 13.6 03/04/2015   HCT 41.8 10/04/2015   HCT 42.4 03/04/2015   PLT 266 10/04/2015   PLT 264 03/04/2015    Lab Results  Component Value Date   NA 139 10/04/2015   NA 138  03/04/2015   K 4.5 10/04/2015   K 4.6 03/04/2015   CHLORIDE 110* 10/04/2015   CO2 19* 10/04/2015   CO2 25 03/04/2015   GLUCOSE 105 10/04/2015   GLUCOSE 122* 03/04/2015   BUN 14.1 10/04/2015   BUN 17 03/04/2015   CREATININE 1.1 10/04/2015   CREATININE 0.99 03/04/2015   BILITOT 0.38 10/04/2015   BILITOT 0.3 03/04/2015   ALKPHOS 61 10/04/2015   ALKPHOS 55 03/04/2015   AST 12 10/04/2015   AST 20 03/04/2015   ALT <9 10/04/2015   ALT 11* 03/04/2015   PROT 6.8 10/04/2015   PROT 7.0 03/04/2015   ALBUMIN 3.4* 10/04/2015   ALBUMIN 3.5 03/04/2015   CALCIUM 8.9 10/04/2015   CALCIUM 8.9 03/04/2015   ANIONGAP 10 10/04/2015   ANIONGAP 7 03/04/2015    Radiographic Findings: Ct Chest Wo Contrast  09/28/2015  CLINICAL DATA:  Left lower lobe lung cancer.  Restaging. EXAM: CT CHEST WITHOUT CONTRAST TECHNIQUE: Multidetector CT imaging of the chest was performed following the standard protocol without IV contrast. COMPARISON:  PET-CT 05/31/2015.  Chest CT 05/03/2015 FINDINGS: Mediastinum / Lymph Nodes: There is no axillary lymphadenopathy. No mediastinal lymphadenopathy. No hilar lymphadenopathy is evident on this study without intravenous contrast material. The heart size is normal. No pericardial effusion. Coronary artery calcification is noted. The esophagus has normal imaging features. Lungs / Pleura: Emphysema noted bilaterally. Stable postsurgical change right hilum. The 9 mm left lower lobe pulmonary nodule measures 8 mm on today's exam. No new pulmonary nodule or mass. No focal airspace consolidation. No pulmonary edema or pleural effusion. Upper Abdomen:  Unremarkable. MSK / Soft Tissues: Bone windows reveal no worrisome lytic or sclerotic osseous lesions. IMPRESSION: Stable exam. No substantial change in the left lower lobe pulmonary nodule. No new or progressive findings. Electronically Signed   By: Misty Stanley M.D.   On: 09/28/2015 14:25    Impression:  82 year old female with a  hypermetabolic left lower lobe nodule with a personal history of adenocarcinoma of the right upper lung  status post SBR T in 3 fractions in December 2016  Plan: Overall the patient appears to be doing quite well. Her symptoms are essentially stable in her imaging reveals that she is radiographically having response to the treatment. We recommended that she return in 6 months time after undergoing repeat CT imaging. She will continue to follow up with Dr. Julien Nordmann as well.  The above documentation reflects my direct findings during this shared patient visit. Please see the separate note by Dr. Tammi Klippel on this date for the remainder of the patient's plan of care.  Carola Rhine, PAC  This document serves as a record of services personally performed by Tyler Pita, MD. It was created on his behalf by Arlyce Harman, a trained medical scribe. The creation of this record is based on the scribe's personal observations and the provider's statements to them. This document has been checked and approved by the attending provider.

## 2015-10-07 NOTE — Progress Notes (Signed)
[facility] [phone]   [phone]  OFFICE PROGRESS NOTE  Becky Sax, MD [phone] [place] [address]  DIAGNOSIS: Stage IB (T2a, N0, M0) non-small cell lung cancer consistent with moderately differentiated adenocarcinoma diagnosed in September of 2014.   PRIOR THERAPY:  1) Bronchoscopy, right video-assisted thoracoscopy, minithoracotomy, right upper lobectomy with lymph node dissection under the care of Dr. Servando Snare on 06/16/2013. Tumor size was 3.5 CM and the myriad genetics showed high risk for recurrence. 2) status post a stereotactic radiotherapy to left lower lobe pulmonary nodule under the care of Dr. Tammi Klippel completed 07/19/2015.  CURRENT THERAPY: Observation.  INTERVAL HISTORY: [name] 82 y.o. female returns to the clinic today for follow-up visit. The patient is feeling fine today with no specific complaints. She has uncontrolled hypertension and she was seen recently by her cardiologist and primary care physician. She is feeling much better today. She changed primary care physician to Dr. Marlou Sa. The patient denied having any significant weight loss or night sweats. She has no chest pain or hemoptysis. The patient denied having any significant fever or chills. She has been observation since her surgery. She had a recent CT scan of the chest and she is here for evaluation and discussion of her scan results.  MEDICAL HISTORY: Past Medical History  Diagnosis Date  . Stroke ([place])   . Arthritis   . Hypertension   . Hyperlipidemia   . Glaucoma   . Peripheral vascular disease, unspecified ([place])     with Claudication  . COPD GOLD I 04/18/2013  . Shortness of breath   . Cancer ([place])     rt lung  . Lung cancer ([place]) 06/07/2013  . S/P radiation therapy 12/12, 12/15, and 07/19/15    The LLL nodule was treated to 54 Gy in 3 fractions of 18 Gy    ALLERGIES:  is allergic to aspirin and morphine and related.  MEDICATIONS:    Current Outpatient Prescriptions  Medication Sig Dispense Refill  . acetaminophen (TYLENOL) 500 MG tablet Take 100 mg by mouth daily as needed for mild pain, moderate pain or headache.     . albuterol (PROAIR HFA) 108 (90 BASE) MCG/ACT inhaler Inhale 2 puffs into the lungs every 6 (six) hours as needed for wheezing (wheezing).     Marland Kitchen amLODipine (NORVASC) 2.5 MG tablet   3  . cholecalciferol (VITAMIN D) 1000 UNITS tablet Take 1,000 Units by mouth daily.    . clopidogrel (PLAVIX) 75 MG tablet Take 75 mg by mouth 3 (three) times a week. Monday, Wednesday, friday    . cyclobenzaprine (FLEXERIL) 10 MG tablet Take 10 mg by mouth 2 (two) times daily. Reported on 07/19/2015    . losartan (COZAAR) 50 MG tablet Take 50 mg by mouth daily.     . meclizine (ANTIVERT) 25 MG tablet Take 25 mg by mouth 3 (three) times daily as needed for dizziness (dizziness). For dizziness    . omeprazole (PRILOSEC) 20 MG capsule Take 20 mg by mouth daily before breakfast.     . pravastatin (PRAVACHOL) 20 MG tablet Take 20 mg by mouth at bedtime.     Marland Kitchen PREPARATION H 0.25-88.44 % suppository   99  . PREVNAR 13 SUSP injection TO BE ADMINISTERED BY PHARMACIST FOR IMMUNIZATION  0  . RESTASIS 0.05 % ophthalmic emulsion Place 1 drop into both eyes every 12 (twelve) hours.     . Sennosides (SENNA LAX PO) Take  1-2 tablets by mouth daily as needed (constipation). For constipation    . traMADol (ULTRAM) 50 MG tablet Take 50 mg by mouth 2 (two) times daily as needed for moderate pain or severe pain (pain).    . TRAVATAN Z 0.004 % SOLN ophthalmic solution Place 1 drop into both eyes at bedtime.      No current facility-administered medications for this visit.    SURGICAL HISTORY:  Past Surgical History  Procedure Laterality Date  . Above knee leg amputation      Right  . Angioplasty / stenting iliac  03/22/09    Aortogram-  left common iliac artery  . Hemorrhoid surgery    . Tonsillectomy    . Colonoscopy  Feb. 2013  . Video  bronchoscopy Bilateral 04/10/2013    Procedure: VIDEO BRONCHOSCOPY WITH FLUORO;  Surgeon: Tanda Rockers, MD;  Location: [facility];  Service: Cardiopulmonary;  Laterality: Bilateral;  . Video bronchoscopy N/A 06/16/2013    Procedure: VIDEO BRONCHOSCOPY;  Surgeon: [name], MD;  Location: [facility] OR;  Service: Thoracic;  Laterality: N/A;  . Video assisted thoracoscopy (vats)/wedge resection Right 06/16/2013    Procedure: VIDEO ASSISTED THORACOSCOPY (VATS)/WEDGE RESECTION;  Surgeon: [name], MD;  Location: [facility];  Service: Thoracic;  Laterality: Right;    REVIEW OF SYSTEMS:  A comprehensive review of systems was negative.   PHYSICAL EXAMINATION: General appearance: alert, cooperative, fatigued and no distress Head: Normocephalic, without obvious abnormality, atraumatic Neck: no adenopathy, no JVD, supple, symmetrical, trachea midline and thyroid not enlarged, symmetric, no tenderness/mass/nodules Lymph nodes: Cervical, supraclavicular, and axillary nodes normal. Resp: clear to auscultation bilaterally Back: symmetric, no curvature. ROM normal. No CVA tenderness. Cardio: regular rate and rhythm, S1, S2 normal, no murmur, click, rub or gallop GI: soft, non-tender; bowel sounds normal; no masses,  no organomegaly Extremities: extremities normal, atraumatic, no cyanosis or edema  ECOG PERFORMANCE STATUS: 1 - Symptomatic but completely ambulatory  Blood pressure 126/45, pulse 64, temperature 97.6 F (36.4 C), temperature source Oral, resp. rate 17, height 5' (1.524 m), weight 149 lb 12.8 oz (67.949 kg), SpO2 100 %.  LABORATORY DATA: Lab Results  Component Value Date   WBC 4.2 10/04/2015   HGB 13.6 10/04/2015   HCT 41.8 10/04/2015   MCV 90.5 10/04/2015   PLT 266 10/04/2015      Chemistry      Component Value Date/Time   NA 139 10/04/2015 1000   NA 138 03/04/2015 0908   K 4.5 10/04/2015 1000   K 4.6 03/04/2015 0908   CL 106 03/04/2015 0908   CO2 19* 10/04/2015 1000     CO2 25 03/04/2015 0908   BUN 14.1 10/04/2015 1000   BUN 17 03/04/2015 0908   CREATININE 1.1 10/04/2015 1000   CREATININE 0.99 03/04/2015 0908      Component Value Date/Time   CALCIUM 8.9 10/04/2015 1000   CALCIUM 8.9 03/04/2015 0908   ALKPHOS 61 10/04/2015 1000   ALKPHOS 55 03/04/2015 0908   AST 12 10/04/2015 1000   AST 20 03/04/2015 0908   ALT <9 10/04/2015 1000   ALT 11* 03/04/2015 0908   BILITOT 0.38 10/04/2015 1000   BILITOT 0.3 03/04/2015 0908       RADIOGRAPHIC STUDIES: Ct Chest Wo Contrast  09/28/2015  CLINICAL DATA:  Left lower lobe lung cancer.  Restaging. EXAM: CT CHEST WITHOUT CONTRAST TECHNIQUE: Multidetector CT imaging of the chest was performed following the standard protocol without IV contrast. COMPARISON:  PET-CT 05/31/2015.  Chest  CT 05/03/2015 FINDINGS: Mediastinum / Lymph Nodes: There is no axillary lymphadenopathy. No mediastinal lymphadenopathy. No hilar lymphadenopathy is evident on this study without intravenous contrast material. The heart size is normal. No pericardial effusion. Coronary artery calcification is noted. The esophagus has normal imaging features. Lungs / Pleura: Emphysema noted bilaterally. Stable postsurgical change right hilum. The 9 mm left lower lobe pulmonary nodule measures 8 mm on today's exam. No new pulmonary nodule or mass. No focal airspace consolidation. No pulmonary edema or pleural effusion. Upper Abdomen:  Unremarkable. MSK / Soft Tissues: Bone windows reveal no worrisome lytic or sclerotic osseous lesions. IMPRESSION: Stable exam. No substantial change in the left lower lobe pulmonary nodule. No new or progressive findings. Electronically Signed   By: Misty Stanley M.D.   On: 09/28/2015 14:25    ASSESSMENT AND PLAN: This is a very pleasant 82 years old African-American female with history of stage IB non-small cell lung cancer, adenocarcinoma status post right upper lobectomy with lymph node dissection and has been observation since  November 2014. The patient CT scan of the chest showed suspicious left lower lobe pulmonary nodule concerning for metastatic disease versus second lung primary. She also underwent stereotactic body radiotherapy to the left lower lobe pulmonary nodule by radiation oncology. The recent CT scan of the chest showed no evidence for disease progression. I discussed the scan results with the patient today. I recommended for her to continue on observation with repeat CT scan of the chest in 6 months. For hypertension, the patient was advised to take her blood pressure medication as a scheduled and also to discuss with her primary care physician adjustment of her medication. The patient was advised to call immediately if she has any concerning symptoms in the interval. The patient voices understanding of current disease status and treatment options and is in agreement with the current care plan.  All questions were answered. The patient knows to call the clinic with any problems, questions or concerns. We can certainly see the patient much sooner if necessary.  Disclaimer: This note was dictated with voice recognition software. Similar sounding words can inadvertently be transcribed and may not be corrected upon review.

## 2015-10-07 NOTE — Telephone Encounter (Signed)
per pof to sch pt appt-gave pt copy of avs-adv central [facility] willc all to sch CT

## 2015-10-07 NOTE — Progress Notes (Addendum)
Ms. Treloar here today for reassessment s/p XRT to her  Left Lower lobe.  VSS.  O2 sat 100% on RA.  Denies any pain in the treatment field. SOB when ambulating.   Travel by wheel chair  SOB with activity.    BP 126/56 mmHg  Pulse 55  Temp(Src) 97.7 F (36.5 C)  Resp 16  Ht 5' (1.524 m)  Wt 151 lb 11.2 oz (68.811 kg)  BMI 29.63 kg/m2  SpO2 100%  Wt Readings from Last 3 Encounters:  10/07/15 151 lb 11.2 oz (68.811 kg)  08/26/15 149 lb 4.8 oz (67.722 kg)  07/30/15 150 lb 8 oz (68.266 kg)

## 2015-10-26 ENCOUNTER — Emergency Department (HOSPITAL_COMMUNITY): Payer: Medicare Other

## 2015-10-26 ENCOUNTER — Institutional Professional Consult (permissible substitution): Payer: Medicare Other | Admitting: Pulmonary Disease

## 2015-10-26 ENCOUNTER — Encounter (HOSPITAL_COMMUNITY): Payer: Self-pay | Admitting: Emergency Medicine

## 2015-10-26 ENCOUNTER — Inpatient Hospital Stay (HOSPITAL_COMMUNITY)
Admission: EM | Admit: 2015-10-26 | Discharge: 2015-10-29 | DRG: 871 | Disposition: A | Discharge status: Home Health | Payer: Medicare Other | Attending: Internal Medicine | Admitting: Internal Medicine

## 2015-10-26 DIAGNOSIS — J189 Pneumonia, unspecified organism: Secondary | ICD-10-CM | POA: Diagnosis present

## 2015-10-26 DIAGNOSIS — R34 Anuria and oliguria: Secondary | ICD-10-CM | POA: Diagnosis present

## 2015-10-26 DIAGNOSIS — R0781 Pleurodynia: Secondary | ICD-10-CM

## 2015-10-26 DIAGNOSIS — Z8673 Personal history of transient ischemic attack (TIA), and cerebral infarction without residual deficits: Secondary | ICD-10-CM

## 2015-10-26 DIAGNOSIS — Z800 Family history of malignant neoplasm of digestive organs: Secondary | ICD-10-CM

## 2015-10-26 DIAGNOSIS — R079 Chest pain, unspecified: Secondary | ICD-10-CM | POA: Diagnosis present

## 2015-10-26 DIAGNOSIS — R0902 Hypoxemia: Secondary | ICD-10-CM | POA: Diagnosis present

## 2015-10-26 DIAGNOSIS — Z79899 Other long term (current) drug therapy: Secondary | ICD-10-CM | POA: Diagnosis not present

## 2015-10-26 DIAGNOSIS — J449 Chronic obstructive pulmonary disease, unspecified: Secondary | ICD-10-CM | POA: Diagnosis present

## 2015-10-26 DIAGNOSIS — C3432 Malignant neoplasm of lower lobe, left bronchus or lung: Secondary | ICD-10-CM | POA: Diagnosis present

## 2015-10-26 DIAGNOSIS — Z886 Allergy status to analgesic agent status: Secondary | ICD-10-CM | POA: Diagnosis not present

## 2015-10-26 DIAGNOSIS — I739 Peripheral vascular disease, unspecified: Secondary | ICD-10-CM | POA: Diagnosis present

## 2015-10-26 DIAGNOSIS — R071 Chest pain on breathing: Secondary | ICD-10-CM | POA: Diagnosis not present

## 2015-10-26 DIAGNOSIS — E872 Acidosis: Secondary | ICD-10-CM | POA: Diagnosis present

## 2015-10-26 DIAGNOSIS — Z89611 Acquired absence of right leg above knee: Secondary | ICD-10-CM

## 2015-10-26 DIAGNOSIS — E785 Hyperlipidemia, unspecified: Secondary | ICD-10-CM | POA: Diagnosis present

## 2015-10-26 DIAGNOSIS — I10 Essential (primary) hypertension: Secondary | ICD-10-CM | POA: Diagnosis present

## 2015-10-26 DIAGNOSIS — Z7901 Long term (current) use of anticoagulants: Secondary | ICD-10-CM | POA: Diagnosis not present

## 2015-10-26 DIAGNOSIS — M1990 Unspecified osteoarthritis, unspecified site: Secondary | ICD-10-CM | POA: Diagnosis present

## 2015-10-26 DIAGNOSIS — H409 Unspecified glaucoma: Secondary | ICD-10-CM | POA: Diagnosis present

## 2015-10-26 DIAGNOSIS — Z885 Allergy status to narcotic agent status: Secondary | ICD-10-CM | POA: Diagnosis not present

## 2015-10-26 DIAGNOSIS — Z823 Family history of stroke: Secondary | ICD-10-CM

## 2015-10-26 DIAGNOSIS — Z87891 Personal history of nicotine dependence: Secondary | ICD-10-CM | POA: Diagnosis not present

## 2015-10-26 DIAGNOSIS — J440 Chronic obstructive pulmonary disease with acute lower respiratory infection: Secondary | ICD-10-CM | POA: Diagnosis present

## 2015-10-26 DIAGNOSIS — Z7951 Long term (current) use of inhaled steroids: Secondary | ICD-10-CM | POA: Diagnosis not present

## 2015-10-26 DIAGNOSIS — J441 Chronic obstructive pulmonary disease with (acute) exacerbation: Secondary | ICD-10-CM | POA: Diagnosis present

## 2015-10-26 DIAGNOSIS — A419 Sepsis, unspecified organism: Secondary | ICD-10-CM | POA: Diagnosis present

## 2015-10-26 DIAGNOSIS — D65 Disseminated intravascular coagulation [defibrination syndrome]: Secondary | ICD-10-CM | POA: Diagnosis present

## 2015-10-26 DIAGNOSIS — Z825 Family history of asthma and other chronic lower respiratory diseases: Secondary | ICD-10-CM | POA: Diagnosis not present

## 2015-10-26 LAB — CBC WITH DIFFERENTIAL/PLATELET
Basophils Absolute: 0 10*3/uL (ref 0.0–0.1)
Basophils Relative: 0 %
Eosinophils Absolute: 0 10*3/uL (ref 0.0–0.7)
Eosinophils Relative: 1 %
HCT: 38.9 % (ref 36.0–46.0)
Hemoglobin: 13.2 g/dL (ref 12.0–15.0)
Lymphocytes Relative: 24 %
Lymphs Abs: 1.6 10*3/uL (ref 0.7–4.0)
MCH: 30.2 pg (ref 26.0–34.0)
MCHC: 33.9 g/dL (ref 30.0–36.0)
MCV: 89 fL (ref 78.0–100.0)
Monocytes Absolute: 0.4 10*3/uL (ref 0.1–1.0)
Monocytes Relative: 5 %
Neutro Abs: 4.9 10*3/uL (ref 1.7–7.7)
Neutrophils Relative %: 70 %
Platelets: 321 10*3/uL (ref 150–400)
RBC: 4.37 MIL/uL (ref 3.87–5.11)
RDW: 13.7 % (ref 11.5–15.5)
WBC: 6.9 10*3/uL (ref 4.0–10.5)

## 2015-10-26 LAB — HEPATIC FUNCTION PANEL
ALT: 9 U/L — ABNORMAL LOW (ref 14–54)
AST: 16 U/L (ref 15–41)
Albumin: 2.5 g/dL — ABNORMAL LOW (ref 3.5–5.0)
Alkaline Phosphatase: 46 U/L (ref 38–126)
Bilirubin, Direct: 0.2 mg/dL (ref 0.1–0.5)
Indirect Bilirubin: 0.1 mg/dL — ABNORMAL LOW (ref 0.3–0.9)
Total Bilirubin: 0.3 mg/dL (ref 0.3–1.2)
Total Protein: 6 g/dL — ABNORMAL LOW (ref 6.5–8.1)

## 2015-10-26 LAB — BASIC METABOLIC PANEL
Anion gap: 11 (ref 5–15)
BUN: 17 mg/dL (ref 6–20)
CO2: 19 mmol/L — ABNORMAL LOW (ref 22–32)
Calcium: 9.1 mg/dL (ref 8.9–10.3)
Chloride: 108 mmol/L (ref 101–111)
Creatinine, Ser: 1.16 mg/dL — ABNORMAL HIGH (ref 0.44–1.00)
GFR calc Af Amer: 48 mL/min — ABNORMAL LOW (ref >60)
GFR calc non Af Amer: 42 mL/min — ABNORMAL LOW (ref >60)
Glucose, Bld: 149 mg/dL — ABNORMAL HIGH (ref 65–99)
Potassium: 3.7 mmol/L (ref 3.5–5.1)
Sodium: 138 mmol/L (ref 135–145)

## 2015-10-26 LAB — URINE MICROSCOPIC-ADD ON

## 2015-10-26 LAB — I-STAT CG4 LACTIC ACID, ED
Lactic Acid, Venous: 2.31 mmol/L (ref 0.5–2.0)
Lactic Acid, Venous: 2.51 mmol/L (ref 0.5–2.0)

## 2015-10-26 LAB — URINALYSIS, ROUTINE W REFLEX MICROSCOPIC
Bilirubin Urine: NEGATIVE
Glucose, UA: NEGATIVE mg/dL
Hgb urine dipstick: NEGATIVE
Ketones, ur: NEGATIVE mg/dL
Leukocytes, UA: NEGATIVE
Nitrite: NEGATIVE
Protein, ur: 30 mg/dL — AB
Specific Gravity, Urine: 1.025 (ref 1.005–1.030)
pH: 5.5 (ref 5.0–8.0)

## 2015-10-26 LAB — APTT: aPTT: 32 seconds (ref 24–37)

## 2015-10-26 LAB — PROTIME-INR
INR: 1.4 (ref 0.00–1.49)
Prothrombin Time: 17.3 seconds — ABNORMAL HIGH (ref 11.6–15.2)

## 2015-10-26 LAB — TROPONIN I: Troponin I: <0.03 ng/mL (ref <0.031)

## 2015-10-26 LAB — PROCALCITONIN: Procalcitonin: 0.42 ng/mL

## 2015-10-26 MED ORDER — SODIUM CHLORIDE 0.9 % IV BOLUS (SEPSIS)
2000.0000 mL | Freq: Once | INTRAVENOUS | Status: AC
  Administered 2015-10-26: 2000 mL via INTRAVENOUS

## 2015-10-26 MED ORDER — CLOPIDOGREL BISULFATE 75 MG PO TABS
75.0000 mg | ORAL_TABLET | ORAL | Status: DC
  Administered 2015-10-27 – 2015-10-29 (×2): 75 mg via ORAL
  Filled 2015-10-26 (×2): qty 1

## 2015-10-26 MED ORDER — PHENYLEPH-SHARK LIV OIL-MO-PET 0.25-3-14-71.9 % RE OINT
TOPICAL_OINTMENT | Freq: Two times a day (BID) | RECTAL | Status: DC | PRN
  Filled 2015-10-26: qty 28.4

## 2015-10-26 MED ORDER — PRAVASTATIN SODIUM 20 MG PO TABS
20.0000 mg | ORAL_TABLET | Freq: Every day | ORAL | Status: DC
  Administered 2015-10-26 – 2015-10-28 (×3): 20 mg via ORAL
  Filled 2015-10-26 (×4): qty 1

## 2015-10-26 MED ORDER — DEXTROSE 5 % IV SOLN: 1.0000 g | Freq: Once | INTRAVENOUS | Status: DC

## 2015-10-26 MED ORDER — LEVOFLOXACIN 750 MG PO TABS
750.0000 mg | ORAL_TABLET | Freq: Once | ORAL | Status: AC
  Administered 2015-10-26: 750 mg via ORAL
  Filled 2015-10-26: qty 1

## 2015-10-26 MED ORDER — ACETAMINOPHEN 500 MG PO TABS: 1000.0000 mg | ORAL_TABLET | Freq: Four times a day (QID) | ORAL | Status: DC | PRN

## 2015-10-26 MED ORDER — PHENYLEPHRINE-COCOA BUTTER 0.25-88.44 % RE SUPP
1.0000 | Freq: Two times a day (BID) | RECTAL | Status: DC | PRN
  Filled 2015-10-26: qty 1

## 2015-10-26 MED ORDER — ALBUTEROL SULFATE (2.5 MG/3ML) 0.083% IN NEBU: 3.0000 mL | INHALATION_SOLUTION | Freq: Four times a day (QID) | RESPIRATORY_TRACT | Status: DC | PRN

## 2015-10-26 MED ORDER — CYCLOSPORINE 0.05 % OP EMUL
1.0000 [drp] | Freq: Two times a day (BID) | OPHTHALMIC | Status: DC
  Administered 2015-10-26 – 2015-10-29 (×6): 1 [drp] via OPHTHALMIC
  Filled 2015-10-26 (×7): qty 1

## 2015-10-26 MED ORDER — AMLODIPINE BESYLATE 2.5 MG PO TABS
2.5000 mg | ORAL_TABLET | Freq: Every day | ORAL | Status: DC
  Administered 2015-10-26 – 2015-10-29 (×4): 2.5 mg via ORAL
  Filled 2015-10-26 (×4): qty 1

## 2015-10-26 MED ORDER — MECLIZINE HCL 25 MG PO TABS
25.0000 mg | ORAL_TABLET | Freq: Three times a day (TID) | ORAL | Status: DC | PRN
  Filled 2015-10-26: qty 1

## 2015-10-26 MED ORDER — LATANOPROST 0.005 % OP SOLN
1.0000 [drp] | Freq: Every day | OPHTHALMIC | Status: DC
  Administered 2015-10-26 – 2015-10-28 (×3): 1 [drp] via OPHTHALMIC
  Filled 2015-10-26: qty 2.5

## 2015-10-26 MED ORDER — DEXTROSE 5 % IV SOLN
500.0000 mg | INTRAVENOUS | Status: DC
  Administered 2015-10-27: 500 mg via INTRAVENOUS
  Filled 2015-10-26 (×2): qty 500

## 2015-10-26 MED ORDER — PANTOPRAZOLE SODIUM 40 MG PO TBEC
40.0000 mg | DELAYED_RELEASE_TABLET | Freq: Every day | ORAL | Status: DC
  Administered 2015-10-27 – 2015-10-29 (×3): 40 mg via ORAL
  Filled 2015-10-26 (×3): qty 1

## 2015-10-26 MED ORDER — HYDROCODONE-ACETAMINOPHEN 7.5-325 MG/15ML PO SOLN
10.0000 mL | Freq: Once | ORAL | Status: AC
  Administered 2015-10-26: 10 mL via ORAL
  Filled 2015-10-26: qty 15

## 2015-10-26 MED ORDER — TRAMADOL HCL 50 MG PO TABS
50.0000 mg | ORAL_TABLET | Freq: Three times a day (TID) | ORAL | Status: DC | PRN
  Administered 2015-10-26 – 2015-10-27 (×2): 50 mg via ORAL
  Filled 2015-10-26 (×3): qty 1

## 2015-10-26 MED ORDER — ACETAMINOPHEN 325 MG PO TABS
650.0000 mg | ORAL_TABLET | Freq: Once | ORAL | Status: AC
  Administered 2015-10-26: 650 mg via ORAL
  Filled 2015-10-26: qty 2

## 2015-10-26 MED ORDER — DEXTROSE 5 % IV SOLN
1.0000 g | INTRAVENOUS | Status: DC
  Administered 2015-10-27 – 2015-10-29 (×3): 1 g via INTRAVENOUS
  Filled 2015-10-26 (×3): qty 10

## 2015-10-26 MED ORDER — DEXTROSE 5 % IV SOLN: 500.0000 mg | Freq: Once | INTRAVENOUS | Status: DC

## 2015-10-26 MED ORDER — ALBUTEROL SULFATE (2.5 MG/3ML) 0.083% IN NEBU
2.5000 mg | INHALATION_SOLUTION | Freq: Four times a day (QID) | RESPIRATORY_TRACT | Status: DC
  Administered 2015-10-26 – 2015-10-28 (×5): 2.5 mg via RESPIRATORY_TRACT
  Filled 2015-10-26 (×5): qty 3

## 2015-10-26 NOTE — ED Notes (Signed)
Bed: TX77 Expected date:  Expected time:  Means of arrival:  Comments: 35F/resp. distress

## 2015-10-26 NOTE — Progress Notes (Signed)
Pharmacy Antibiotic Note  [name] is a 82 y.o. female admitted on 10/26/2015 with one week hx of cough and increasing dyspnea. Pt has hx of  moderately differentiated lung adenocarcinoma diagnosed 03/2013. Pharmacy has been consulted for azithromycin and ceftriaxone dosing.  Pt receive one dose of levaquin '750mg'$  po in ED today.  Plan:\- Starting 24 hours from levaquin Azithromycin '500mg'$  IV q24h Ceftriaxone 1gm IV q24h No adjustment needed for renal impairment, pharmacy will sign off Please re-consult as needed     Temp (24hrs), Avg:99 F (37.2 C), Min:98 F (36.7 C), Max:101.1 F (38.4 C)   Recent Labs Lab 10/26/15 1047 10/26/15 1242 10/26/15 1551  WBC 6.9  --   --   CREATININE 1.16*  --   --   LATICACIDVEN  --  2.31* 2.51*    CrCl cannot be calculated (Unknown ideal weight.).    Allergies  Allergen Reactions  . Aspirin Anaphylaxis  . Morphine And Related Other (See Comments)    confusion    Antimicrobials this admission: 3/28 levaquin '750mg'$  x1 3/29 azithromycin << 3/29 ceftriaxone << Dose adjustments this admission:   Microbiology results: 3/28 BCx: x2   Thank you for allowing pharmacy to be a part of this patient's care.  Dolly Rias RPh 10/26/2015, 4:48 PM Pager [phone]

## 2015-10-26 NOTE — Progress Notes (Signed)
Pt transferred from ED to room 1513. Pt AO x 4. Pt belongings are at bedside. No questions or concerns from the pt at this time.  Gaynelle Pastrana W Riki Berninger, RN

## 2015-10-26 NOTE — ED Notes (Addendum)
Per EMS, from home. 1 x week, SOB, productive cough, green sputum, runny nose, possibly running a fever. Home health states left sided rib/flank pain that's chronic. Hx of lung CA.  EMS gave 5 albuterol in route. On RA sats 88%, improved to 2L 98%

## 2015-10-26 NOTE — H&P (Addendum)
History and Physical:    [name]   SAY:[id number] DOB: 1928/06/04 DOA: 10/26/2015  Referring MD/provider: Dr. Tanna Furry PCP: Becky Sax, MD   Chief Complaint: Cough  History of Present Illness:   [name] is an 82 y.o. female with a PMH of moderately differentiated lung adenocarcinoma diagnosed 03/2013, status post mini thoracotomy and right upper lobectomy as well as stereotactic radiotherapy now under observation who was seen by Dr. Julien Nordmann on 10/07/15 status post surveillance CT of the chest which showed no evidence of disease progression, who now presents with a one-week history of cough and increasing dyspnea. She has had some sick contacts. Reports having the pneumonia vaccination every year as well as the pneumococcal vaccination. She has had a sore throat, but reports this has resolved. Her cough is productive of clear-yellow sputum. She also fell last week from weakness, but currently does not feel weak. She has tried using her home inhaler without success. EMS reported an oxygen saturation of 88% and she was given an albuterol nebulizer treatment in route to the hospital. Her oxygen saturation was 98% on arrival. She also reports some left-sided pleuritic type chest pains. Upon initial evaluation in the ED, chest x-ray shows a mild left lower lobe infiltrate. Temperature was 101.1. WBC was 6.9. Lactic acid was 2.31.   ROS:   Review of Systems  Constitutional: Positive for fever. Negative for chills.  HENT: Positive for congestion and sore throat.   Eyes: Negative.   Respiratory: Positive for cough, sputum production, shortness of breath and wheezing.   Cardiovascular: Positive for chest pain. Negative for palpitations.  Gastrointestinal: Positive for nausea, abdominal pain and constipation. Negative for diarrhea and blood in stool.  Genitourinary: Positive for dysuria.  Musculoskeletal: Positive for myalgias, joint pain and falls.  Neurological: Positive for  dizziness.     Past Medical History:   Past Medical History  Diagnosis Date  . Stroke ([place])   . Arthritis   . Hypertension   . Hyperlipidemia   . Glaucoma   . Peripheral vascular disease, unspecified ([place])     with Claudication  . COPD GOLD I 04/18/2013  . Shortness of breath   . Cancer ([place])     rt lung  . Lung cancer ([place]) 06/07/2013  . S/P radiation therapy 12/12, 12/15, and 07/19/15    The LLL nodule was treated to 54 Gy in 3 fractions of 18 Gy    Past Surgical History:   Past Surgical History  Procedure Laterality Date  . Above knee leg amputation      Right  . Angioplasty / stenting iliac  03/22/09    Aortogram-  left common iliac artery  . Hemorrhoid surgery    . Tonsillectomy    . Colonoscopy  Feb. 2013  . Video bronchoscopy Bilateral 04/10/2013    Procedure: VIDEO BRONCHOSCOPY WITH FLUORO;  Surgeon: Tanda Rockers, MD;  Location: [facility];  Service: Cardiopulmonary;  Laterality: Bilateral;  . Video bronchoscopy N/A 06/16/2013    Procedure: VIDEO BRONCHOSCOPY;  Surgeon: [name], MD;  Location: [facility] OR;  Service: Thoracic;  Laterality: N/A;  . Video assisted thoracoscopy (vats)/wedge resection Right 06/16/2013    Procedure: VIDEO ASSISTED THORACOSCOPY (VATS)/WEDGE RESECTION;  Surgeon: [name], MD;  Location: [facility];  Service: Thoracic;  Laterality: Right;    Social History:   Social History   Social History  . Marital Status: Widowed    Spouse Name: N/A  . Number of  Children: 1  . Years of Education: N/A   Occupational History  . Retired    Social History Main Topics  . Smoking status: Former Smoker -- 0.50 packs/day for 60 years    Types: Cigarettes    Quit date: 07/31/2009  . Smokeless tobacco: Never Used  . Alcohol Use: No  . Drug Use: No  . Sexual Activity: No   Other Topics Concern  . Not on file   Social History Narrative   Pt lives alone. She is HOH, especially left ear.    Family history:   Family History    Problem Relation Age of Onset  . Stroke Brother   . Stomach cancer Mother   . Asthma Sister   . Colon cancer Brother     Allergies   Aspirin and Morphine and related  Current Medications:   Prior to Admission medications   Medication Sig Start Date End Date Taking? Authorizing Provider  acetaminophen (TYLENOL) 500 MG tablet Take 1,000 mg by mouth every 6 (six) hours as needed for mild pain, moderate pain or headache.    Yes Historical Provider, MD  albuterol (PROAIR HFA) 108 (90 BASE) MCG/ACT inhaler Inhale 2 puffs into the lungs every 6 (six) hours as needed for wheezing (wheezing).    Yes Historical Provider, MD  amLODipine (NORVASC) 2.5 MG tablet Take 2.5 mg by mouth daily.  06/23/15  Yes Historical Provider, MD  clopidogrel (PLAVIX) 75 MG tablet Take 75 mg by mouth every Monday, Wednesday, and Friday.    Yes Historical Provider, MD  cycloSPORINE (RESTASIS) 0.05 % ophthalmic emulsion Place 1 drop into both eyes 2 (two) times daily.   Yes Historical Provider, MD  losartan (COZAAR) 50 MG tablet Take 50 mg by mouth daily.  01/19/15  Yes Historical Provider, MD  meclizine (ANTIVERT) 25 MG tablet Take 25 mg by mouth 3 (three) times daily as needed for dizziness (dizziness). For dizziness   Yes Historical Provider, MD  omeprazole (PRILOSEC) 20 MG capsule Take 20 mg by mouth daily before breakfast.    Yes Historical Provider, MD  pravastatin (PRAVACHOL) 20 MG tablet Take 20 mg by mouth at bedtime.    Yes Historical Provider, MD  PREPARATION H 0.25-88.44 % suppository Place 1 suppository rectally as needed for hemorrhoids.  03/25/15  Yes Historical Provider, MD  Sennosides (SENNA LAX PO) Take 1-2 tablets by mouth daily as needed (constipation). For constipation   Yes Historical Provider, MD  traMADol (ULTRAM) 50 MG tablet Take 50 mg by mouth every 8 (eight) hours as needed for moderate pain or severe pain (pain).    Yes Historical Provider, MD  TRAVATAN Z 0.004 % SOLN ophthalmic solution Place  1 drop into both eyes at bedtime.  08/19/14  Yes Historical Provider, MD    Physical Exam:   Filed Vitals:   10/26/15 1408 10/26/15 1530 10/26/15 1550 10/26/15 1612  BP: 144/62 143/59  134/60  Pulse: 65 89  77  Temp:   98 F (36.7 C) 98 F (36.7 C)  TempSrc:   Oral Oral  Resp: '24 28  22  '$ SpO2: 98% 69%  100%     Physical Exam: Blood pressure 134/60, pulse 77, temperature 98 F (36.7 C), temperature source Oral, resp. rate 22, SpO2 100 %. Gen: No acute distress. Head: Normocephalic, atraumatic. Eyes: PERRL, EOMI, sclerae nonicteric. Mouth: Posterior pharyngeal erythema.  Dentures upper.   Neck: Supple, no thyromegaly, no lymphadenopathy, no jugular venous distention. Chest: Lungs are dimiished Throughout, but no frank  wheezes. CV: Heart sounds are regular. No murmurs, rubs, or gallops. Abdomen: Soft, nontender, nondistended with normal active bowel sounds. Extremities: Right AKA, left lower extremity without clubbing, edema, or cyanosis. 2+ pulse. Skin: Warm and dry. Neuro: Alert and oriented times 3; grossly nonfocal.  Psych: Mood and affect normal.   Data Review:    Labs: Basic Metabolic Panel:  Recent Labs Lab 10/26/15 1047  NA 138  K 3.7  CL 108  CO2 19*  GLUCOSE 149*  BUN 17  CREATININE 1.16*  CALCIUM 9.1   Liver Function Tests: No results for input(s): AST, ALT, ALKPHOS, BILITOT, PROT, ALBUMIN in the last 168 hours. No results for input(s): LIPASE, AMYLASE in the last 168 hours. No results for input(s): AMMONIA in the last 168 hours. CBC:  Recent Labs Lab 10/26/15 1047  WBC 6.9  NEUTROABS 4.9  HGB 13.2  HCT 38.9  MCV 89.0  PLT 321   Cardiac Enzymes:  Recent Labs Lab 10/26/15 1047  TROPONINI <0.03    Radiographic Studies: Dg Chest 2 View  10/26/2015  CLINICAL DATA:  Cough and congestion for 1 month EXAM: CHEST  2 VIEW COMPARISON:  09/28/2015 FINDINGS: Cardiac shadow is within normal limits. Lungs are well aerated bilaterally. Best seen  on the lateral projection is a small focal area of infiltrate in the left lower lobe. No effusion is seen. No bony abnormality is noted. IMPRESSION: Mild left lower lobe infiltrate. Electronically Signed   By: Inez Catalina M.D.   On: 10/26/2015 11:13    EKG: Independently reviewed. Accelerated junctional rhythm at 72 bpm. Borderline ST depression in the lateral leads.   Assessment/Plan:   Principal Problem:   Sepsis ([place]) secondary to community-acquired pneumonia - Sepsis order set utilized.  - Meets 2 or more SIRS criteria (Temp > 100.9 <96.8; HR >90; RR >20; PaCO2<32; WBC >12K<4K or >10 %bands AND has evidence of acute organ failure (lactic acidosis, oliguria, ALI, ARDS, coagulopathy/DIC, AMS, hypotension/shock) - This patient is not at high risk of poor outcomes with a SOFA score of 0. - Source is from community-acquired pneumonia. Left lower lobe infiltrate noted on chest radiography. - Send blood and sputum cultures. - WBC is 6.4, lactic acid is 2.31.  Check pro-calcitoninin. - Fluid volume resuscitate with 30 mg/kg using weight based algorithm per sepsis order set. - Start targeted antibiotics with Rocephin/azithromycin, based on suspected source of infection.  Active Problems:   Chest pain - Felt to be pleuritic. Screening troponin negative.    Hypertension - Continue Norvasc. Hold Cozaar.    COPD GOLD I With transient hypoxia - Bronchodilators ordered Every 6 hours and when necessary. Monitor for COPD exacerbation. - Transient hypoxia noted. Provide supplemental oxygen as needed.    Cancer of lower lobe of left lung ([place]) - No evidence of recurrence.    DVT prophylaxis - Lovenox ordered.  Code Status / Family Communication / Disposition Plan:   Code Status: Full. Family Communication: No family currently at the bedside. Disposition Plan: Home in 3 days if stable. Lives alone..  Time spent: One hour.  Shannel Zahm [facility] Pager [phone] Cell:  [phone]   If 7PM-7AM, please contact night-coverage [email or web address] Password [email or web address] 10/26/2015, 4:43 PM

## 2015-10-26 NOTE — ED Provider Notes (Signed)
CSN: [id number]     Arrival date & time 10/26/15  1001 History   First MD Initiated Contact with Patient 10/26/15 1009     Chief Complaint  Patient presents with  . Shortness of Breath  . Cough     HPI  She presents for evaluation of a cough for greater than 1 week. Has history of COPD. Has a home inhaler. His been using it regularly. Has a cough has become productive of yellow. Uncertain about fever. She states her home health care nurse checked this morning and states that her temperature was 98. Given albuterol in route. Apparently initially had saturations of 88. Here on room air is 98% the time of my exam.  Some pain in her left lateral chest that she states is regular for her. Has been worse with her cough. No nausea vomiting. No leg swelling.  History of COPD. History of lung cancer status post open biopsy with VATS, and radiation therapy. Most recent CT and follow-up showed no sign of recurrence. This was with Dr. Julien Nordmann in March of this year.  Past Medical History  Diagnosis Date  . Stroke ([place])   . Arthritis   . Hypertension   . Hyperlipidemia   . Glaucoma   . Peripheral vascular disease, unspecified ([place])     with Claudication  . COPD GOLD I 04/18/2013  . Shortness of breath   . Cancer ([place])     rt lung  . Lung cancer ([place]) 06/07/2013  . S/P radiation therapy 12/12, 12/15, and 07/19/15    The LLL nodule was treated to 54 Gy in 3 fractions of 18 Gy   Past Surgical History  Procedure Laterality Date  . Above knee leg amputation      Right  . Angioplasty / stenting iliac  03/22/09    Aortogram-  left common iliac artery  . Hemorrhoid surgery    . Tonsillectomy    . Colonoscopy  Feb. 2013  . Video bronchoscopy Bilateral 04/10/2013    Procedure: VIDEO BRONCHOSCOPY WITH FLUORO;  Surgeon: Tanda Rockers, MD;  Location: [facility];  Service: Cardiopulmonary;  Laterality: Bilateral;  . Video bronchoscopy N/A 06/16/2013    Procedure: VIDEO BRONCHOSCOPY;  Surgeon: [name], MD;  Location: [facility] OR;  Service: Thoracic;  Laterality: N/A;  . Video assisted thoracoscopy (vats)/wedge resection Right 06/16/2013    Procedure: VIDEO ASSISTED THORACOSCOPY (VATS)/WEDGE RESECTION;  Surgeon: [name], MD;  Location: [facility];  Service: Thoracic;  Laterality: Right;   Family History  Problem Relation Age of Onset  . Stroke Brother   . Stomach cancer Mother   . Asthma Sister   . Colon cancer Brother    Social History  Substance Use Topics  . Smoking status: Former Smoker -- 0.50 packs/day for 60 years    Types: Cigarettes    Quit date: 07/31/2009  . Smokeless tobacco: Never Used  . Alcohol Use: No   OB History    No data available     Review of Systems  Constitutional: Negative for fever, chills, diaphoresis, appetite change and fatigue.  HENT: Negative for mouth sores, sore throat and trouble swallowing.   Eyes: Negative for visual disturbance.  Respiratory: Positive for cough and shortness of breath. Negative for chest tightness and wheezing.   Cardiovascular: Negative for chest pain.  Gastrointestinal: Negative for nausea, vomiting, abdominal pain, diarrhea and abdominal distention.  Endocrine: Negative for polydipsia, polyphagia and polyuria.  Genitourinary: Negative for dysuria, frequency and hematuria.  Musculoskeletal: Negative for gait problem.  Skin: Negative for color change, pallor and rash.  Neurological: Positive for weakness. Negative for dizziness, syncope, light-headedness and headaches.  Hematological: Does not bruise/bleed easily.  Psychiatric/Behavioral: Negative for behavioral problems and confusion.      Allergies  Aspirin and Morphine and related  Home Medications   Prior to Admission medications   Medication Sig Start Date End Date Taking? Authorizing Provider  acetaminophen (TYLENOL) 500 MG tablet Take 1,000 mg by mouth every 6 (six) hours as needed for mild pain, moderate pain or headache.    Yes Historical  Provider, MD  albuterol (PROAIR HFA) 108 (90 BASE) MCG/ACT inhaler Inhale 2 puffs into the lungs every 6 (six) hours as needed for wheezing (wheezing).    Yes Historical Provider, MD  amLODipine (NORVASC) 2.5 MG tablet Take 2.5 mg by mouth daily.  06/23/15  Yes Historical Provider, MD  clopidogrel (PLAVIX) 75 MG tablet Take 75 mg by mouth every Monday, Wednesday, and Friday.    Yes Historical Provider, MD  cycloSPORINE (RESTASIS) 0.05 % ophthalmic emulsion Place 1 drop into both eyes 2 (two) times daily.   Yes Historical Provider, MD  losartan (COZAAR) 50 MG tablet Take 50 mg by mouth daily.  01/19/15  Yes Historical Provider, MD  meclizine (ANTIVERT) 25 MG tablet Take 25 mg by mouth 3 (three) times daily as needed for dizziness (dizziness). For dizziness   Yes Historical Provider, MD  omeprazole (PRILOSEC) 20 MG capsule Take 20 mg by mouth daily before breakfast.    Yes Historical Provider, MD  pravastatin (PRAVACHOL) 20 MG tablet Take 20 mg by mouth at bedtime.    Yes Historical Provider, MD  PREPARATION H 0.25-88.44 % suppository Place 1 suppository rectally as needed for hemorrhoids.  03/25/15  Yes Historical Provider, MD  Sennosides (SENNA LAX PO) Take 1-2 tablets by mouth daily as needed (constipation). For constipation   Yes Historical Provider, MD  traMADol (ULTRAM) 50 MG tablet Take 50 mg by mouth every 8 (eight) hours as needed for moderate pain or severe pain (pain).    Yes Historical Provider, MD  TRAVATAN Z 0.004 % SOLN ophthalmic solution Place 1 drop into both eyes at bedtime.  08/19/14  Yes Historical Provider, MD   BP 121/52 mmHg  Pulse 67  Temp(Src) 99.4 F (37.4 C) (Oral)  Resp 22  SpO2 98% Physical Exam  Constitutional: She is oriented to person, place, and time. She appears well-developed and well-nourished. No distress.  Elderly black female. Awake and alert. No distress. On room air and speaking without dyspnea. Recheck oral temperature by myself 99.4.  HENT:  Head:  Normocephalic.  Eyes: Conjunctivae are normal. Pupils are equal, round, and reactive to light. No scleral icterus.  Neck: Normal range of motion. Neck supple. No thyromegaly present.  Cardiovascular: Normal rate and regular rhythm.  Exam reveals no gallop and no friction rub.   No murmur heard. Pulmonary/Chest: Effort normal and breath sounds normal. No respiratory distress. She has no wheezes. She has no rales.  Clear but distant symmetric bilateral breath sounds. No dependent changes. No wheezing or prolongation.  Abdominal: Soft. Bowel sounds are normal. She exhibits no distension. There is no tenderness. There is no rebound.  Musculoskeletal: Normal range of motion.  Neurological: She is alert and oriented to person, place, and time.  Skin: Skin is warm and dry. No rash noted.  Psychiatric: She has a normal mood and affect. Her behavior is normal.    ED Course  Procedures (including critical care time) Labs Review Labs Reviewed  BASIC METABOLIC PANEL - Abnormal; Notable for the following:    CO2 19 (*)    Glucose, Bld 149 (*)    Creatinine, Ser 1.16 (*)    GFR calc non Af Amer 42 (*)    GFR calc Af Amer 48 (*)    All other components within normal limits  URINALYSIS, ROUTINE W REFLEX MICROSCOPIC (NOT AT [facility]) - Abnormal; Notable for the following:    APPearance CLOUDY (*)    Protein, ur 30 (*)    All other components within normal limits  PROTIME-INR - Abnormal; Notable for the following:    Prothrombin Time 17.3 (*)    All other components within normal limits  HEPATIC FUNCTION PANEL - Abnormal; Notable for the following:    Total Protein 6.0 (*)    Albumin 2.5 (*)    ALT 9 (*)    Indirect Bilirubin 0.1 (*)    All other components within normal limits  URINE MICROSCOPIC-ADD ON - Abnormal; Notable for the following:    Squamous Epithelial / LPF 6-30 (*)    Bacteria, UA MANY (*)    Casts HYALINE CASTS (*)    All other components within normal limits  I-STAT CG4 LACTIC  ACID, ED - Abnormal; Notable for the following:    Lactic Acid, Venous 2.31 (*)    All other components within normal limits  I-STAT CG4 LACTIC ACID, ED - Abnormal; Notable for the following:    Lactic Acid, Venous 2.51 (*)    All other components within normal limits  URINE CULTURE  CULTURE, BLOOD (ROUTINE X 2)  CULTURE, BLOOD (ROUTINE X 2)  CULTURE, EXPECTORATED SPUTUM-ASSESSMENT  CBC WITH DIFFERENTIAL/PLATELET  TROPONIN I  PROCALCITONIN  APTT  I-STAT CG4 LACTIC ACID, ED    Imaging Review Dg Chest 2 View  10/26/2015  CLINICAL DATA:  Cough and congestion for 1 month EXAM: CHEST  2 VIEW COMPARISON:  09/28/2015 FINDINGS: Cardiac shadow is within normal limits. Lungs are well aerated bilaterally. Best seen on the lateral projection is a small focal area of infiltrate in the left lower lobe. No effusion is seen. No bony abnormality is noted. IMPRESSION: Mild left lower lobe infiltrate. Electronically Signed   By: Inez Catalina M.D.   On: 10/26/2015 11:13   I have personally reviewed and evaluated these images and lab results as part of my medical decision-making.   EKG Interpretation   Date/Time:  Tuesday October 26 2015 10:12:21 EDT Ventricular Rate:  71 PR Interval:    QRS Duration: 114 QT Interval:  440 QTC Calculation: 478 R Axis:   -30 Text Interpretation:  Accelerated junctional rhythm Borderline IVCD with  LAD Low voltage, extremity leads Borderline ST depression, lateral leads  ED PHYSICIAN INTERPRETATION AVAILABLE IN [facility] Confirmed by  TEST, Record ([id number]) on 10/27/2015 6:37:55 AM      MDM   Final diagnoses:  CAP (community acquired pneumonia)    Patient saturations improved. On 2 L and saturating 98%. Daughter arrives. She states that she feels there is some confusion this morning and today as well. [place] shows left lower lobe pneumonia. Initially, we're unable to obtain IV access. Pt had initially declined additional attempts.  I have spoken with  patient. We agreed to attempt ambulation, evaluate labs, and reassess for need of admission versus home treatment. Patient given by mouth Levaquin, and labs awaited. Lactate is elevated at 2.3. Pt is dyspneic to and from bathroom.  No leukocytosis. Cr 1.16 BUNs 17 CO2 19. Plan will be IV fluids, repeat lactate, admission.    Tanna Furry, MD 10/27/15 [phone]

## 2015-10-26 NOTE — ED Notes (Signed)
Attempted IV access, unsuccessful x 1. Was able to draw blood. Will retry once if need for IV medication in future

## 2015-10-26 NOTE — ED Notes (Signed)
Dr. And RN notified of pt's Lactic Acid of 2.51

## 2015-10-27 DIAGNOSIS — A419 Sepsis, unspecified organism: Principal | ICD-10-CM

## 2015-10-27 DIAGNOSIS — I10 Essential (primary) hypertension: Secondary | ICD-10-CM

## 2015-10-27 DIAGNOSIS — R071 Chest pain on breathing: Secondary | ICD-10-CM

## 2015-10-27 DIAGNOSIS — J449 Chronic obstructive pulmonary disease, unspecified: Secondary | ICD-10-CM

## 2015-10-27 DIAGNOSIS — J189 Pneumonia, unspecified organism: Secondary | ICD-10-CM

## 2015-10-27 LAB — INFLUENZA PANEL BY PCR (TYPE A & B)
H1N1 flu by pcr: NOT DETECTED
Influenza A By PCR: NEGATIVE
Influenza B By PCR: NEGATIVE

## 2015-10-27 MED ORDER — GUAIFENESIN ER 600 MG PO TB12
1200.0000 mg | ORAL_TABLET | Freq: Two times a day (BID) | ORAL | Status: DC
Start: 2015-10-27 — End: 2015-10-29
  Administered 2015-10-27 – 2015-10-29 (×5): 1200 mg via ORAL
  Filled 2015-10-27 (×7): qty 2

## 2015-10-27 MED ORDER — ENOXAPARIN SODIUM 30 MG/0.3ML ~~LOC~~ SOLN
30.0000 mg | Freq: Every day | SUBCUTANEOUS | Status: DC
  Administered 2015-10-27 – 2015-10-29 (×3): 30 mg via SUBCUTANEOUS
  Filled 2015-10-27 (×3): qty 0.3

## 2015-10-27 NOTE — Progress Notes (Signed)
Patient Demographics  [name], is a 82 y.o. female, DOB - 1928-03-30, PPJ:[id number]  Admit date - 10/26/2015   Admitting Physician Venetia Maxon Rama, MD  Outpatient Primary MD for the patient is Becky Sax, MD  LOS - 1   Chief Complaint  Patient presents with  . Shortness of Breath  . Cough       Admission HPI/Brief narrative: 82 y.o. female with a PMH of moderately differentiated lung adenocarcinoma diagnosed 03/2013, status post mini thoracotomy and right upper lobectomy and stereotactic radiotherapy, followed  by Dr. Julien Nordmann on 10/07/15 ,recent surveillance CT of the chest which showed no evidence of disease progression, presents with PNA.  Subjective:   [name] today has, No headache, No chest pain, No abdominal pain -complaints of cough with productive yellow sputum, complaints of generalized weakness . Assessment & Plan    Principal Problem:   Sepsis ([place]) Active Problems:   Chest pain   Hypertension   COPD GOLD I   Cancer of lower lobe of left lung (HCC)   CAP (community acquired pneumonia)   Hypoxia  Sepsis ([place]) secondary to community-acquired pneumonia - Meets 2 or more SIRS criteria (Temp > 100.9 <96.8; HR >90; RR >20; PaCO2<32; WBC >12K<4K or >10 %bands AND has evidence of acute organ failure (lactic acidosis, oliguria, ALI, ARDS, coagulopathy/DIC, AMS, hypotension/shock) - Source is from community-acquired pneumonia. Left lower lobe infiltrate noted on chest radiography. - Follow on sputum cultures - Follow on blood cultures - Continue with IV Rocephin/azithromycin,  - Will start on Mucinex, flutter valve, - Check influenza panel  Chest pain - Felt to be pleuritic. Screening troponin negative.  Hypertension - Acceptable, Continue Norvasc. Hold Cozaar.   COPD GOLD I With transient hypoxia - Bronchodilators ordered Every 6 hours and when necessary. Monitor for  COPD exacerbation. - Transient hypoxia noted. Provide supplemental oxygen as needed.  Cancer of lower lobe of left lung ([place]) - No evidence of recurrence, followed by Dr. Earlie Server   Code Status: Full  Family Communication: none at bedside  Disposition Plan: home when stable   Procedures  none   Consults   none   Medications  Scheduled Meds: . albuterol  2.5 mg Nebulization QID  . amLODipine  2.5 mg Oral Daily  . azithromycin  500 mg Intravenous Q24H  . cefTRIAXone (ROCEPHIN)  IV  1 g Intravenous Q24H  . clopidogrel  75 mg Oral Q M,W,F  . cycloSPORINE  1 drop Both Eyes BID  . enoxaparin (LOVENOX) injection  30 mg Subcutaneous Daily  . guaiFENesin  1,200 mg Oral BID  . latanoprost  1 drop Both Eyes QHS  . pantoprazole  40 mg Oral Daily  . pravastatin  20 mg Oral QHS   Continuous Infusions:  PRN Meds:.acetaminophen, albuterol, meclizine, phenylephrine-shark liver oil-mineral oil-petrolatum, traMADol  DVT Prophylaxis  Lovenox -  Lab Results  Component Value Date   PLT 321 10/26/2015    Antibiotics    Anti-infectives    Start     Dose/Rate Route Frequency Ordered Stop   10/27/15 1300  azithromycin (ZITHROMAX) 500 mg in dextrose 5 % 250 mL IVPB     500 mg 250 mL/hr over 60 Minutes Intravenous Every 24 hours 10/26/15 1649  10/27/15 1200  cefTRIAXone (ROCEPHIN) 1 g in dextrose 5 % 50 mL IVPB     1 g 100 mL/hr over 30 Minutes Intravenous Every 24 hours 10/26/15 1649     10/26/15 1630  cefTRIAXone (ROCEPHIN) 1 g in dextrose 5 % 50 mL IVPB  Status:  Discontinued     1 g 100 mL/hr over 30 Minutes Intravenous  Once 10/26/15 1623 10/26/15 1635   10/26/15 1630  azithromycin (ZITHROMAX) 500 mg in dextrose 5 % 250 mL IVPB  Status:  Discontinued     500 mg 250 mL/hr over 60 Minutes Intravenous  Once 10/26/15 1623 10/26/15 1635   10/26/15 1215  levofloxacin (LEVAQUIN) tablet 750 mg     750 mg Oral  Once 10/26/15 1214 10/26/15 1252          Objective:   Filed  Vitals:   10/26/15 2117 10/26/15 2126 10/27/15 0455 10/27/15 0916  BP:  123/55 121/52 127/49  Pulse:  68 67 62  Temp:  98.3 F (36.8 C) 99.4 F (37.4 C) 98.5 F (36.9 C)  TempSrc:  Oral Oral Oral  Resp:  '22 22 24  '$ SpO2: 95% 100% 98% 98%    Wt Readings from Last 3 Encounters:  10/07/15 67.949 kg (149 lb 12.8 oz)  10/07/15 68.811 kg (151 lb 11.2 oz)  08/26/15 67.722 kg (149 lb 4.8 oz)     Intake/Output Summary (Last 24 hours) at 10/27/15 1133 Last data filed at 10/27/15 1000  Gross per 24 hour  Intake     50 ml  Output      0 ml  Net     50 ml     Physical Exam  Awake Alert, Oriented X 3,  [place].AT,PERRAL Supple Neck,No JVD, Symmetrical Chest wall movement, Good air movement bilaterally, scattered wheezing  RRR,No Gallops,Rubs or new Murmurs, No Parasternal Heave +ve B.Sounds, Abd Soft, No tenderness, No organomegaly appriciated, No rebound - guarding or rigidity. No Cyanosis, Clubbing or edema, No new Rash or bruise    Data Review   Micro Results No results found for this or any previous visit (from the past 240 hour(s)).  Radiology Reports Dg Chest 2 View  10/26/2015  CLINICAL DATA:  Cough and congestion for 1 month EXAM: CHEST  2 VIEW COMPARISON:  09/28/2015 FINDINGS: Cardiac shadow is within normal limits. Lungs are well aerated bilaterally. Best seen on the lateral projection is a small focal area of infiltrate in the left lower lobe. No effusion is seen. No bony abnormality is noted. IMPRESSION: Mild left lower lobe infiltrate. Electronically Signed   By: Inez Catalina M.D.   On: 10/26/2015 11:13   Ct Chest Wo Contrast  09/28/2015  CLINICAL DATA:  Left lower lobe lung cancer.  Restaging. EXAM: CT CHEST WITHOUT CONTRAST TECHNIQUE: Multidetector CT imaging of the chest was performed following the standard protocol without IV contrast. COMPARISON:  PET-CT 05/31/2015.  Chest CT 05/03/2015 FINDINGS: Mediastinum / Lymph Nodes: There is no axillary lymphadenopathy. No  mediastinal lymphadenopathy. No hilar lymphadenopathy is evident on this study without intravenous contrast material. The heart size is normal. No pericardial effusion. Coronary artery calcification is noted. The esophagus has normal imaging features. Lungs / Pleura: Emphysema noted bilaterally. Stable postsurgical change right hilum. The 9 mm left lower lobe pulmonary nodule measures 8 mm on today's exam. No new pulmonary nodule or mass. No focal airspace consolidation. No pulmonary edema or pleural effusion. Upper Abdomen:  Unremarkable. MSK / Soft Tissues: Bone windows reveal no worrisome  lytic or sclerotic osseous lesions. IMPRESSION: Stable exam. No substantial change in the left lower lobe pulmonary nodule. No new or progressive findings. Electronically Signed   By: Misty Stanley M.D.   On: 09/28/2015 14:25     CBC  Recent Labs Lab 10/26/15 1047  WBC 6.9  HGB 13.2  HCT 38.9  PLT 321  MCV 89.0  MCH 30.2  MCHC 33.9  RDW 13.7  LYMPHSABS 1.6  MONOABS 0.4  EOSABS 0.0  BASOSABS 0.0    Chemistries   Recent Labs Lab 10/26/15 1047 10/26/15 1637  NA 138  --   K 3.7  --   CL 108  --   CO2 19*  --   GLUCOSE 149*  --   BUN 17  --   CREATININE 1.16*  --   CALCIUM 9.1  --   AST  --  16  ALT  --  9*  ALKPHOS  --  46  BILITOT  --  0.3   ------------------------------------------------------------------------------------------------------------------ CrCl cannot be calculated (Unknown ideal weight.). ------------------------------------------------------------------------------------------------------------------ No results for input(s): HGBA1C in the last 72 hours. ------------------------------------------------------------------------------------------------------------------ No results for input(s): CHOL, HDL, LDLCALC, TRIG, CHOLHDL, LDLDIRECT in the last 72  hours. ------------------------------------------------------------------------------------------------------------------ No results for input(s): TSH, T4TOTAL, T3FREE, THYROIDAB in the last 72 hours.  Invalid input(s): FREET3 ------------------------------------------------------------------------------------------------------------------ No results for input(s): VITAMINB12, FOLATE, FERRITIN, TIBC, IRON, RETICCTPCT in the last 72 hours.  Coagulation profile  Recent Labs Lab 10/26/15 1637  INR 1.40    No results for input(s): DDIMER in the last 72 hours.  Cardiac Enzymes  Recent Labs Lab 10/26/15 1047  TROPONINI <0.03   ------------------------------------------------------------------------------------------------------------------ Invalid input(s): POCBNP     Time Spent in minutes   35 minutes   Linford Quintela M.D on 10/27/2015 at 11:33 AM  Between 7am to 7pm - Pager - [phone]  After 7pm go to [email or web address] - password [facility]  [facility]   Office  [phone]

## 2015-10-28 ENCOUNTER — Telehealth: Payer: Self-pay | Admitting: Medical Oncology

## 2015-10-28 LAB — BASIC METABOLIC PANEL
Anion gap: 6 (ref 5–15)
BUN: 9 mg/dL (ref 6–20)
CO2: 21 mmol/L — ABNORMAL LOW (ref 22–32)
Calcium: 8.2 mg/dL — ABNORMAL LOW (ref 8.9–10.3)
Chloride: 110 mmol/L (ref 101–111)
Creatinine, Ser: 0.62 mg/dL (ref 0.44–1.00)
GFR calc Af Amer: >60 mL/min (ref >60)
GFR calc non Af Amer: >60 mL/min (ref >60)
Glucose, Bld: 100 mg/dL — ABNORMAL HIGH (ref 65–99)
Potassium: 3.5 mmol/L (ref 3.5–5.1)
Sodium: 137 mmol/L (ref 135–145)

## 2015-10-28 LAB — URINE CULTURE: Culture: 3000

## 2015-10-28 LAB — CBC
HCT: 31.4 % — ABNORMAL LOW (ref 36.0–46.0)
Hemoglobin: 10.7 g/dL — ABNORMAL LOW (ref 12.0–15.0)
MCH: 30.2 pg (ref 26.0–34.0)
MCHC: 34.1 g/dL (ref 30.0–36.0)
MCV: 88.7 fL (ref 78.0–100.0)
Platelets: 333 10*3/uL (ref 150–400)
RBC: 3.54 MIL/uL — ABNORMAL LOW (ref 3.87–5.11)
RDW: 14.1 % (ref 11.5–15.5)
WBC: 6.5 10*3/uL (ref 4.0–10.5)

## 2015-10-28 MED ORDER — SENNOSIDES-DOCUSATE SODIUM 8.6-50 MG PO TABS
2.0000 | ORAL_TABLET | Freq: Two times a day (BID) | ORAL | Status: DC
  Administered 2015-10-28 – 2015-10-29 (×2): 2 via ORAL
  Filled 2015-10-28 (×3): qty 2

## 2015-10-28 MED ORDER — AZITHROMYCIN 500 MG PO TABS
500.0000 mg | ORAL_TABLET | ORAL | Status: DC
  Administered 2015-10-28 – 2015-10-29 (×2): 500 mg via ORAL
  Filled 2015-10-28 (×3): qty 1

## 2015-10-28 MED ORDER — ALBUTEROL SULFATE (2.5 MG/3ML) 0.083% IN NEBU
2.5000 mg | INHALATION_SOLUTION | Freq: Three times a day (TID) | RESPIRATORY_TRACT | Status: DC
  Administered 2015-10-28 – 2015-10-29 (×4): 2.5 mg via RESPIRATORY_TRACT
  Filled 2015-10-28 (×4): qty 3

## 2015-10-28 MED ORDER — SORBITOL 70 % SOLN
30.0000 mL | Freq: Once | Status: AC
  Administered 2015-10-28: 30 mL via ORAL
  Filled 2015-10-28: qty 30

## 2015-10-28 MED ORDER — POLYETHYLENE GLYCOL 3350 17 G PO PACK
34.0000 g | PACK | Freq: Every day | ORAL | Status: DC
  Administered 2015-10-28 – 2015-10-29 (×2): 34 g via ORAL
  Filled 2015-10-28 (×2): qty 2

## 2015-10-28 NOTE — Progress Notes (Signed)
PHARMACIST - PHYSICIAN COMMUNICATION  CONCERNING: Antibiotic IV to Oral Route Change Policy  RECOMMENDATION: This patient is receiving azithromycin by the intravenous route.  Based on criteria approved by the Pharmacy and Therapeutics Committee, the antibiotic(s) is/are being converted to the equivalent oral dose form(s).   DESCRIPTION: These criteria include:  Patient being treated for a respiratory tract infection, urinary tract infection, cellulitis or clostridium difficile associated diarrhea if on metronidazole  The patient is not neutropenic and does not exhibit a GI malabsorption state  The patient is eating (either orally or via tube) and/or has been taking other orally administered medications for a least 24 hours  The patient is improving clinically and has a Tmax < 100.5  If you have questions about this conversion, please contact the Pharmacy Department  '[]'$   [phone] )  [facility] '[]'$   [phone] )  [facility] '[]'$   [phone] )  [facility] '[]'$   [phone] )  [facility] '[x]'$   [phone] )  Pe[place], PharmD candidate 10/28/2015 8:22 AM

## 2015-10-28 NOTE — Telephone Encounter (Signed)
I left a message for [name] to call me back with reason for referral . Pt is established with Julien Nordmann and next visit is 6 months.

## 2015-10-28 NOTE — Progress Notes (Signed)
Patient Demographics  [name], is a 82 y.o. female, DOB - 10/25/1927, BSW:[id number]  Admit date - 10/26/2015   Admitting Physician Venetia Maxon Rama, MD  Outpatient Primary MD for the patient is Becky Sax, MD  LOS - 2   Chief Complaint  Patient presents with  . Shortness of Breath  . Cough       Admission HPI/Brief narrative: 82 y.o. female with a PMH of moderately differentiated lung adenocarcinoma diagnosed 03/2013, status post mini thoracotomy and right upper lobectomy and stereotactic radiotherapy, followed  by Dr. Julien Nordmann on 10/07/15 ,recent surveillance CT of the chest which showed no evidence of disease progression, presents with PNA.  Subjective:   [name] today has, No headache, No chest pain, No abdominal pain -complaints of cough with productive yellow sputum, complaints of generalized weakness . Assessment & Plan    Principal Problem:   Sepsis ([place]) Active Problems:   Chest pain   Hypertension   COPD GOLD I   Cancer of lower lobe of left lung (HCC)   CAP (community acquired pneumonia)   Hypoxia  Sepsis ([place]) secondary to community-acquired pneumonia - Meets 2 or more SIRS criteria (Temp > 100.9 <96.8; HR >90; RR >20; PaCO2<32; WBC >12K<4K or >10 %bands AND has evidence of acute organ failure (lactic acidosis, oliguria, ALI, ARDS, coagulopathy/DIC, AMS, hypotension/shock) - Source is from community-acquired pneumonia. Left lower lobe infiltrate noted on chest radiography. - Follow on sputum cultures - Follow on blood cultures: No growth to date - Continue with IV Rocephin/azithromycin,  - Continue with Mucinex, flutter valve, -  influenza panel negative  Chest pain - Felt to be pleuritic. Screening troponin negative.  Hypertension - Acceptable, Continue Norvasc. Hold Cozaar.   COPD GOLD I With transient hypoxia - Bronchodilators ordered Every 6 hours and when  necessary. Monitor for COPD exacerbation. - Transient hypoxia noted. Provide supplemental oxygen as needed.  Cancer of lower lobe of left lung ([place]) - No evidence of recurrence, followed by Dr. Earlie Server   Code Status: Full  Family Communication: none at bedside  Disposition Plan: home when stable   Procedures  none   Consults   none   Medications  Scheduled Meds: . albuterol  2.5 mg Nebulization TID  . amLODipine  2.5 mg Oral Daily  . azithromycin  500 mg Oral Q24H  . cefTRIAXone (ROCEPHIN)  IV  1 g Intravenous Q24H  . clopidogrel  75 mg Oral Q M,W,F  . cycloSPORINE  1 drop Both Eyes BID  . enoxaparin (LOVENOX) injection  30 mg Subcutaneous Daily  . guaiFENesin  1,200 mg Oral BID  . latanoprost  1 drop Both Eyes QHS  . pantoprazole  40 mg Oral Daily  . polyethylene glycol  34 g Oral Daily  . pravastatin  20 mg Oral QHS  . senna-docusate  2 tablet Oral BID  . sorbitol  30 mL Oral Once   Continuous Infusions:  PRN Meds:.acetaminophen, albuterol, meclizine, phenylephrine-shark liver oil-mineral oil-petrolatum, traMADol  DVT Prophylaxis  Lovenox -  Lab Results  Component Value Date   PLT 333 10/28/2015    Antibiotics    Anti-infectives    Start     Dose/Rate Route Frequency Ordered Stop   10/28/15 1200  azithromycin (ZITHROMAX) tablet 500 mg     500 mg Oral Every 24 hours 10/28/15 0923     10/27/15 1300  azithromycin (ZITHROMAX) 500 mg in dextrose 5 % 250 mL IVPB  Status:  Discontinued     500 mg 250 mL/hr over 60 Minutes Intravenous Every 24 hours 10/26/15 1649 10/28/15 0923   10/27/15 1200  cefTRIAXone (ROCEPHIN) 1 g in dextrose 5 % 50 mL IVPB     1 g 100 mL/hr over 30 Minutes Intravenous Every 24 hours 10/26/15 1649     10/26/15 1630  cefTRIAXone (ROCEPHIN) 1 g in dextrose 5 % 50 mL IVPB  Status:  Discontinued     1 g 100 mL/hr over 30 Minutes Intravenous  Once 10/26/15 1623 10/26/15 1635   10/26/15 1630  azithromycin (ZITHROMAX) 500 mg in dextrose 5 %  250 mL IVPB  Status:  Discontinued     500 mg 250 mL/hr over 60 Minutes Intravenous  Once 10/26/15 1623 10/26/15 1635   10/26/15 1215  levofloxacin (LEVAQUIN) tablet 750 mg     750 mg Oral  Once 10/26/15 1214 10/26/15 1252          Objective:   Filed Vitals:   10/28/15 0559 10/28/15 0746 10/28/15 0946 10/28/15 1305  BP: 129/55  136/52   Pulse: 72 73 78 78  Temp: 98.3 F (36.8 C)     TempSrc: Oral     Resp: '18 18  18  '$ SpO2: 99% 98%  98%    Wt Readings from Last 3 Encounters:  10/07/15 67.949 kg (149 lb 12.8 oz)  10/07/15 68.811 kg (151 lb 11.2 oz)  08/26/15 67.722 kg (149 lb 4.8 oz)     Intake/Output Summary (Last 24 hours) at 10/28/15 1349 Last data filed at 10/28/15 1000  Gross per 24 hour  Intake    360 ml  Output      0 ml  Net    360 ml     Physical Exam  Awake Alert, Oriented X 3,  [place].AT,PERRAL Supple Neck,No JVD, Symmetrical Chest wall movement, Good air movement bilaterally, scattered wheezing  RRR,No Gallops,Rubs or new Murmurs, No Parasternal Heave +ve B.Sounds, Abd Soft, No tenderness, No organomegaly appriciated, No rebound - guarding or rigidity. No Cyanosis, Clubbing or edema, No new Rash or bruise    Data Review   Micro Results Recent Results (from the past 240 hour(s))  Culture, blood (Routine X 2) w Reflex to ID Panel     Status: None (Preliminary result)   Collection Time: 10/26/15 12:21 PM  Result Value Ref Range Status   Specimen Description BLOOD RIGHT ANTECUBITAL  Final   Special Requests BOTTLES DRAWN AEROBIC AND ANAEROBIC 5CC  Final   Culture   Final    NO GROWTH < 24 HOURS Performed at [facility]    Report Status PENDING  Incomplete  Culture, blood (Routine X 2) w Reflex to ID Panel     Status: None (Preliminary result)   Collection Time: 10/26/15 12:21 PM  Result Value Ref Range Status   Specimen Description BLOOD LEFT ANTECUBITAL  Final   Special Requests BOTTLES DRAWN AEROBIC AND ANAEROBIC 5CC  Final   Culture    Final    NO GROWTH < 24 HOURS Performed at [facility]    Report Status PENDING  Incomplete  Urine culture     Status: None   Collection Time: 10/26/15  7:02 PM  Result Value Ref Range Status   Specimen Description  URINE, CLEAN CATCH  Final   Special Requests NONE  Final   Culture   Final    3,000 COLONIES/mL INSIGNIFICANT GROWTH Performed at [facility]    Report Status 10/28/2015 FINAL  Final    Radiology Reports Dg Chest 2 View  10/26/2015  CLINICAL DATA:  Cough and congestion for 1 month EXAM: CHEST  2 VIEW COMPARISON:  09/28/2015 FINDINGS: Cardiac shadow is within normal limits. Lungs are well aerated bilaterally. Best seen on the lateral projection is a small focal area of infiltrate in the left lower lobe. No effusion is seen. No bony abnormality is noted. IMPRESSION: Mild left lower lobe infiltrate. Electronically Signed   By: Inez Catalina M.D.   On: 10/26/2015 11:13     CBC  Recent Labs Lab 10/26/15 1047 10/28/15 0520  WBC 6.9 6.5  HGB 13.2 10.7*  HCT 38.9 31.4*  PLT 321 333  MCV 89.0 88.7  MCH 30.2 30.2  MCHC 33.9 34.1  RDW 13.7 14.1  LYMPHSABS 1.6  --   MONOABS 0.4  --   EOSABS 0.0  --   BASOSABS 0.0  --     Chemistries   Recent Labs Lab 10/26/15 1047 10/26/15 1637 10/28/15 0520  NA 138  --  137  K 3.7  --  3.5  CL 108  --  110  CO2 19*  --  21*  GLUCOSE 149*  --  100*  BUN 17  --  9  CREATININE 1.16*  --  0.62  CALCIUM 9.1  --  8.2*  AST  --  16  --   ALT  --  9*  --   ALKPHOS  --  46  --   BILITOT  --  0.3  --    ------------------------------------------------------------------------------------------------------------------ CrCl cannot be calculated (Unknown ideal weight.). ------------------------------------------------------------------------------------------------------------------ No results for input(s): HGBA1C in the last 72  hours. ------------------------------------------------------------------------------------------------------------------ No results for input(s): CHOL, HDL, LDLCALC, TRIG, CHOLHDL, LDLDIRECT in the last 72 hours. ------------------------------------------------------------------------------------------------------------------ No results for input(s): TSH, T4TOTAL, T3FREE, THYROIDAB in the last 72 hours.  Invalid input(s): FREET3 ------------------------------------------------------------------------------------------------------------------ No results for input(s): VITAMINB12, FOLATE, FERRITIN, TIBC, IRON, RETICCTPCT in the last 72 hours.  Coagulation profile  Recent Labs Lab 10/26/15 1637  INR 1.40    No results for input(s): DDIMER in the last 72 hours.  Cardiac Enzymes  Recent Labs Lab 10/26/15 1047  TROPONINI <0.03   ------------------------------------------------------------------------------------------------------------------ Invalid input(s): POCBNP     Time Spent in minutes   25 minutes   Avaiyah Strubel M.D on 10/28/2015 at 1:49 PM  Between 7am to 7pm - Pager - [phone]  After 7pm go to [email or web address] - password [facility]  [facility]   Office  [phone]

## 2015-10-29 MED ORDER — SENNOSIDES-DOCUSATE SODIUM 8.6-50 MG PO TABS: 2.0000 | ORAL_TABLET | Freq: Two times a day (BID) | ORAL | Status: DC

## 2015-10-29 MED ORDER — ALBUTEROL SULFATE (2.5 MG/3ML) 0.083% IN NEBU: 2.5000 mg | INHALATION_SOLUTION | Freq: Three times a day (TID) | RESPIRATORY_TRACT | Status: DC

## 2015-10-29 MED ORDER — LEVOFLOXACIN 500 MG PO TABS: 500.0000 mg | ORAL_TABLET | Freq: Every day | ORAL | Status: DC

## 2015-10-29 MED ORDER — POLYETHYLENE GLYCOL 3350 17 G PO PACK: 34.0000 g | PACK | Freq: Every day | ORAL | Status: DC | PRN

## 2015-10-29 MED ORDER — GUAIFENESIN ER 600 MG PO TB12: 1200.0000 mg | ORAL_TABLET | Freq: Two times a day (BID) | ORAL | Status: DC

## 2015-10-29 MED ORDER — SACCHAROMYCES BOULARDII 250 MG PO CAPS: 250.0000 mg | ORAL_CAPSULE | Freq: Two times a day (BID) | ORAL | Status: DC

## 2015-10-29 NOTE — Progress Notes (Signed)
[facility]    [facility] is providing the following services: nebulizer  If patient discharges after hours, please call [phone].   Linward Headland 10/29/2015, 12:44 PM

## 2015-10-29 NOTE — Progress Notes (Signed)
Pt left the unit in stable condition,  transported  home by her home care giver.

## 2015-10-29 NOTE — Care Management Important Message (Signed)
Important Message  Patient Details  Name: MELLIE BUCCELLATO MRN: [id number] Date of Birth: [birth year]   Medicare Important Message Given:  Yes    Camillo Flaming 10/29/2015, 9:19 AMImportant Message  Patient Details  Name: AIESHA LELAND MRN: [id number] Date of Birth: [birth year]   Medicare Important Message Given:  Yes    Camillo Flaming 10/29/2015, 9:18 AM

## 2015-10-29 NOTE — Discharge Summary (Addendum)
[name], is a 82 y.o. female  DOB [birth year]  MRN [id number].  Admission date:  10/26/2015  Admitting Physician  Venetia Maxon Rama, MD  Discharge Date:  10/29/2015   Primary MD  Becky Sax, MD  Recommendations for primary care physician for things to follow:  - check CBC, BMP during next visit,  - Please check 2 view chest x-ray in 2 weeks.   Admission Diagnosis  CAP (community acquired pneumonia) [J18.9]   Discharge Diagnosis  CAP (community acquired pneumonia) [J18.9]    Principal Problem:   Sepsis ([place]) Active Problems:   Chest pain   Hypertension   COPD GOLD I   Cancer of lower lobe of left lung ([place])   CAP (community acquired pneumonia)   Hypoxia      Past Medical History  Diagnosis Date  . Stroke ([place])   . Arthritis   . Hypertension   . Hyperlipidemia   . Glaucoma   . Peripheral vascular disease, unspecified ([place])     with Claudication  . COPD GOLD I 04/18/2013  . Shortness of breath   . Cancer ([place])     rt lung  . Lung cancer ([place]) 06/07/2013  . S/P radiation therapy 12/12, 12/15, and 07/19/15    The LLL nodule was treated to 54 Gy in 3 fractions of 18 Gy    Past Surgical History  Procedure Laterality Date  . Above knee leg amputation      Right  . Angioplasty / stenting iliac  03/22/09    Aortogram-  left common iliac artery  . Hemorrhoid surgery    . Tonsillectomy    . Colonoscopy  Feb. 2013  . Video bronchoscopy Bilateral 04/10/2013    Procedure: VIDEO BRONCHOSCOPY WITH FLUORO;  Surgeon: Tanda Rockers, MD;  Location: [facility];  Service: Cardiopulmonary;  Laterality: Bilateral;  . Video bronchoscopy N/A 06/16/2013    Procedure: VIDEO BRONCHOSCOPY;  Surgeon: [name], MD;  Location: [facility] OR;  Service: Thoracic;  Laterality: N/A;  . Video assisted thoracoscopy (vats)/wedge resection Right 06/16/2013    Procedure: VIDEO ASSISTED THORACOSCOPY  (VATS)/WEDGE RESECTION;  Surgeon: [name], MD;  Location: [place];  Service: Thoracic;  Laterality: Right;       History of present illness and  Hospital Course:     Kindly see H&P for history of present illness and admission details, please review complete Labs, Consult reports and Test reports for all details in brief  HPI  from the history and physical done on the day of admission 10/26/2015 [name] is an 82 y.o. female with a PMH of moderately differentiated lung adenocarcinoma diagnosed 03/2013, status post mini thoracotomy and right upper lobectomy as well as stereotactic radiotherapy now under observation who was seen by Dr. Julien Nordmann on 10/07/15 status post surveillance CT of the chest which showed no evidence of disease progression, who now presents with a one-week history of cough and increasing dyspnea. She has had some sick contacts. Reports having the pneumonia vaccination every  year as well as the pneumococcal vaccination. She has had a sore throat, but reports this has resolved. Her cough is productive of clear-yellow sputum. She also fell last week from weakness, but currently does not feel weak. She has tried using her home inhaler without success. EMS reported an oxygen saturation of 88% and she was given an albuterol nebulizer treatment in route to the hospital. Her oxygen saturation was 98% on arrival. She also reports some left-sided pleuritic type chest pains. Upon initial evaluation in the ED, chest x-ray shows a mild left lower lobe infiltrate. Temperature was 101.1. WBC was 6.9. Lactic acid was 2.31.   Hospital Course  82 y.o. female with a PMH of moderately differentiated lung adenocarcinoma diagnosed 03/2013, status post mini thoracotomy and right upper lobectomy and stereotactic radiotherapy, followed by Dr. Julien Nordmann on 10/07/15 ,recent surveillance CT of the chest which showed no evidence of disease progression, presents with PNA.   Sepsis ([place]) secondary to  community-acquired pneumonia - Meets 2 or more SIRS criteria (Temp > 100.9 <96.8; HR >90; RR >20; PaCO2<32; WBC >12K<4K or >10 %bands AND has evidence of acute organ failure (lactic acidosis, oliguria, ALI, ARDS, coagulopathy/DIC, AMS, hypotension/shock), physiology of sepsis is resolved at time of discharge - Source is from community-acquired pneumonia. Left lower lobe infiltrate noted on chest radiography. -  blood cultures: No growth to date at time of discharge - Treated with IV Rocephin and azithromycin during hospital stay, finish total of 4 days, to finish another 3 days of by mouth levofloxacin - Continue with Mucinex, flutter valve - influenza panel negative  Chest pain - Felt to be pleuritic. Screening troponin negative.  Hypertension - Acceptable, continue home medication   COPD GOLD I With transient hypoxia - We'll arrange for home nebulizer to continue albuterol when necessary  Cancer of lower lobe of left lung ([place]) - No evidence of recurrence, followed by Dr. Earlie Server    Discharge Condition: stable   Follow UP  Follow-up Information    Follow up with Becky Sax, MD. Schedule an appointment as soon as possible for a visit in 1 week.   Specialty:  Family Medicine   Why:  Posthospitalization follow-up   Contact information:   [address] [place] [address] [phone]         Discharge Instructions  and  Discharge Medications     Discharge Instructions    Discharge instructions    Complete by:  As directed   Follow with Primary MD Becky Sax, MD in 7 days   Get CBC, CMP, 2 view Chest X ray checked  by Primary MD next visit.    Activity: As tolerated with Full fall precautions use walker/cane & assistance as needed   Disposition Home    Diet: Heart Healthy  , with feeding assistance and aspiration precautions.  For Heart failure patients - Check your Weight same time everyday, if you gain over 2 pounds, or you develop in  leg swelling, experience more shortness of breath or chest pain, call your Primary MD immediately. Follow Cardiac Low Salt Diet and 1.5 lit/day fluid restriction.   On your next visit with your primary care physician please Get Medicines reviewed and adjusted.   Please request your Prim.MD to go over all Hospital Tests and Procedure/Radiological results at the follow up, please get all Hospital records sent to your Prim MD by signing hospital release before you go home.   If you experience worsening of your admission symptoms,  develop shortness of breath, life threatening emergency, suicidal or homicidal thoughts you must seek medical attention immediately by calling 911 or calling your MD immediately  if symptoms less severe.  You Must read complete instructions/literature along with all the possible adverse reactions/side effects for all the Medicines you take and that have been prescribed to you. Take any new Medicines after you have completely understood and accpet all the possible adverse reactions/side effects.   Do not drive, operating heavy machinery, perform activities at heights, swimming or participation in water activities or provide baby sitting services if your were admitted for syncope or siezures until you have seen by Primary MD or a Neurologist and advised to do so again.  Do not drive when taking Pain medications.    Do not take more than prescribed Pain, Sleep and Anxiety Medications  Special Instructions: If you have smoked or chewed Tobacco  in the last 2 yrs please stop smoking, stop any regular Alcohol  and or any Recreational drug use.  Wear Seat belts while driving.   Please note  You were cared for by a hospitalist during your hospital stay. If you have any questions about your discharge medications or the care you received while you were in the hospital after you are discharged, you can call the unit and asked to speak with the hospitalist on call if the  hospitalist that took care of you is not available. Once you are discharged, your primary care physician will handle any further medical issues. Please note that NO REFILLS for any discharge medications will be authorized once you are discharged, as it is imperative that you return to your primary care physician (or establish a relationship with a primary care physician if you do not have one) for your aftercare needs so that they can reassess your need for medications and monitor your lab values.     Increase activity slowly    Complete by:  As directed             Medication List    TAKE these medications        acetaminophen 500 MG tablet  Commonly known as:  TYLENOL  Take 1,000 mg by mouth every 6 (six) hours as needed for mild pain, moderate pain or headache.     amLODipine 2.5 MG tablet  Commonly known as:  NORVASC  Take 2.5 mg by mouth daily.     clopidogrel 75 MG tablet  Commonly known as:  PLAVIX  Take 75 mg by mouth every Monday, Wednesday, and Friday.     cycloSPORINE 0.05 % ophthalmic emulsion  Commonly known as:  RESTASIS  Place 1 drop into both eyes 2 (two) times daily.     guaiFENesin 600 MG 12 hr tablet  Commonly known as:  MUCINEX  Take 2 tablets (1,200 mg total) by mouth 2 (two) times daily.     levofloxacin 500 MG tablet  Commonly known as:  LEVAQUIN  Take 1 tablet (500 mg total) by mouth daily.     losartan 50 MG tablet  Commonly known as:  COZAAR  Take 50 mg by mouth daily.     meclizine 25 MG tablet  Commonly known as:  ANTIVERT  Take 25 mg by mouth 3 (three) times daily as needed for dizziness (dizziness). For dizziness     omeprazole 20 MG capsule  Commonly known as:  PRILOSEC  Take 20 mg by mouth daily before breakfast.     polyethylene glycol packet  Commonly  known as:  MIRALAX / GLYCOLAX  Take 34 g by mouth daily as needed.     pravastatin 20 MG tablet  Commonly known as:  PRAVACHOL  Take 20 mg by mouth at bedtime.     PREPARATION H  0.25-88.44 % suppository  Generic drug:  shark liver oil-cocoa butter  Place 1 suppository rectally as needed for hemorrhoids.     albuterol (2.5 MG/3ML) 0.083% nebulizer solution  Commonly known as:  PROVENTIL  Take 3 mLs (2.5 mg total) by nebulization 3 (three) times daily.     PROAIR HFA 108 (90 Base) MCG/ACT inhaler  Generic drug:  albuterol  Inhale 2 puffs into the lungs every 6 (six) hours as needed for wheezing (wheezing).     saccharomyces boulardii 250 MG capsule  Commonly known as:  FLORASTOR  Take 1 capsule (250 mg total) by mouth 2 (two) times daily.     SENNA LAX PO  Take 1-2 tablets by mouth daily as needed (constipation). For constipation     senna-docusate 8.6-50 MG tablet  Commonly known as:  Senokot-S  Take 2 tablets by mouth 2 (two) times daily.     traMADol 50 MG tablet  Commonly known as:  ULTRAM  Take 50 mg by mouth every 8 (eight) hours as needed for moderate pain or severe pain (pain).     TRAVATAN Z 0.004 % Soln ophthalmic solution  Generic drug:  Travoprost (BAK Free)  Place 1 drop into both eyes at bedtime.          Diet and Activity recommendation: See Discharge Instructions above   Consults obtained -  none   Major procedures and Radiology Reports - PLEASE review detailed and final reports for all details, in brief -   none   Dg Chest 2 View  10/26/2015  CLINICAL DATA:  Cough and congestion for 1 month EXAM: CHEST  2 VIEW COMPARISON:  09/28/2015 FINDINGS: Cardiac shadow is within normal limits. Lungs are well aerated bilaterally. Best seen on the lateral projection is a small focal area of infiltrate in the left lower lobe. No effusion is seen. No bony abnormality is noted. IMPRESSION: Mild left lower lobe infiltrate. Electronically Signed   By: Inez Catalina M.D.   On: 10/26/2015 11:13    Micro Results    Recent Results (from the past 240 hour(s))  Culture, blood (Routine X 2) w Reflex to ID Panel     Status: None (Preliminary  result)   Collection Time: 10/26/15 12:21 PM  Result Value Ref Range Status   Specimen Description BLOOD RIGHT ANTECUBITAL  Final   Special Requests BOTTLES DRAWN AEROBIC AND ANAEROBIC 5CC  Final   Culture   Final    NO GROWTH 2 DAYS Performed at [facility]    Report Status PENDING  Incomplete  Culture, blood (Routine X 2) w Reflex to ID Panel     Status: None (Preliminary result)   Collection Time: 10/26/15 12:21 PM  Result Value Ref Range Status   Specimen Description BLOOD LEFT ANTECUBITAL  Final   Special Requests BOTTLES DRAWN AEROBIC AND ANAEROBIC 5CC  Final   Culture   Final    NO GROWTH 2 DAYS Performed at [facility]    Report Status PENDING  Incomplete  Urine culture     Status: None   Collection Time: 10/26/15  7:02 PM  Result Value Ref Range Status   Specimen Description URINE, CLEAN CATCH  Final   Special Requests NONE  Final   Culture   Final    3,000 COLONIES/mL INSIGNIFICANT GROWTH Performed at [facility]    Report Status 10/28/2015 FINAL  Final       Today   Subjective:   Lillyian Heidt today has no headache,no chest abdominal pain,no new weakness tingling or numbness, Reports cough significantly improved, currently dry feels much better wants to go home today.   Objective:   Blood pressure 137/56, pulse 73, temperature 98 F (36.7 C), temperature source Oral, resp. rate 17, height 5' (1.524 m), weight 64.6 kg (142 lb 6.7 oz), SpO2 95 %.   Intake/Output Summary (Last 24 hours) at 10/29/15 1039 Last data filed at 10/28/15 2122  Gross per 24 hour  Intake    170 ml  Output      0 ml  Net    170 ml    Exam Awake Alert, Oriented x 3, No new F.N deficits, Normal affect [place].AT,PERRAL Supple Neck,No JVD,  Symmetrical Chest wall movement, Good air movement bilaterally, CTAB RRR,No Gallops,Rubs or new Murmurs, No Parasternal Heave +ve B.Sounds, Abd Soft, Non tender, No organomegaly appriciated, No rebound -guarding or  rigidity. No Cyanosis, Clubbing or edemaOn left, right AKA  Data Review   CBC w Diff: Lab Results  Component Value Date   WBC 6.5 10/28/2015   WBC 4.2 10/04/2015   HGB 10.7* 10/28/2015   HGB 13.6 10/04/2015   HCT 31.4* 10/28/2015   HCT 41.8 10/04/2015   PLT 333 10/28/2015   PLT 266 10/04/2015   LYMPHOPCT 24 10/26/2015   LYMPHOPCT 42.0 10/04/2015   MONOPCT 5 10/26/2015   MONOPCT 9.0 10/04/2015   EOSPCT 1 10/26/2015   EOSPCT 0.9 10/04/2015   BASOPCT 0 10/26/2015   BASOPCT 0.2 10/04/2015    CMP: Lab Results  Component Value Date   NA 137 10/28/2015   NA 139 10/04/2015   K 3.5 10/28/2015   K 4.5 10/04/2015   CL 110 10/28/2015   CO2 21* 10/28/2015   CO2 19* 10/04/2015   BUN 9 10/28/2015   BUN 14.1 10/04/2015   CREATININE 0.62 10/28/2015   CREATININE 1.1 10/04/2015   PROT 6.0* 10/26/2015   PROT 6.8 10/04/2015   ALBUMIN 2.5* 10/26/2015   ALBUMIN 3.4* 10/04/2015   BILITOT 0.3 10/26/2015   BILITOT 0.38 10/04/2015   ALKPHOS 46 10/26/2015   ALKPHOS 61 10/04/2015   AST 16 10/26/2015   AST 12 10/04/2015   ALT 9* 10/26/2015   ALT <9 10/04/2015  .   Total Time in preparing paper work, data evaluation and todays exam - 35 minutes  Torie Towle M.D on 10/29/2015 at 10:39 AM  [facility]   Office  [phone]

## 2015-10-29 NOTE — Care Management Note (Signed)
Case Management Note  Patient Details  Name: SHAUN ZUCCARO MRN: [id number] Date of Birth: 1928/06/09  Subjective/Objective:    82 yo admitted with Sepsis                Action/Plan: Pt from home alone  Expected Discharge Date:   (UNKNOWN)               Expected Discharge Plan:  [place]  In-House Referral:     Discharge planning Services  CM Consult  Post Acute Care Choice:  Home Health Choice offered to:  Patient  DME Arranged:    DME Agency:     HH Arranged:  RN, PT [place] Agency:  [facility]  Status of Service:  Completed, signed off  Medicare Important Message Given:  Yes Date Medicare IM Given:    Medicare IM give by:    Date Additional Medicare IM Given:    Additional Medicare Important Message give by:     If discussed at [place] of Stay Meetings, dates discussed:    Additional Comments: Orders written for HHPT/RN.  This CM met with pt at bedside to offer choice for [facility] services.  Pt states she has used [facility] in the past and would like to use them again.  This CM contacted [facility] rep for referral.  Pt also to DC home with a nebulizer machine.  Order written by MD.  This CM contacted [facility] DME rep for referral.  No other CM needs communicated.   Lynnell Catalan, RN 10/29/2015, 2:51 PM [phone]

## 2015-10-29 NOTE — Discharge Instructions (Signed)
Follow with Primary MD Nicole Sax, MD in 7 days   Get CBC, CMP, 2 view Chest X ray checked  by Primary MD next visit.    Activity: As tolerated with Full fall precautions use walker/cane & assistance as needed   Disposition Home    Diet: Heart Healthy  , with feeding assistance and aspiration precautions.  For Heart failure patients - Check your Weight same time everyday, if you gain over 2 pounds, or you develop in leg swelling, experience more shortness of breath or chest pain, call your Primary MD immediately. Follow Cardiac Low Salt Diet and 1.5 lit/day fluid restriction.   On your next visit with your primary care physician please Get Medicines reviewed and adjusted.   Please request your Prim.MD to go over all Hospital Tests and Procedure/Radiological results at the follow up, please get all Hospital records sent to your Prim MD by signing hospital release before you go home.   If you experience worsening of your admission symptoms, develop shortness of breath, life threatening emergency, suicidal or homicidal thoughts you must seek medical attention immediately by calling 911 or calling your MD immediately  if symptoms less severe.  You Must read complete instructions/literature along with all the possible adverse reactions/side effects for all the Medicines you take and that have been prescribed to you. Take any new Medicines after you have completely understood and accpet all the possible adverse reactions/side effects.   Do not drive, operating heavy machinery, perform activities at heights, swimming or participation in water activities or provide baby sitting services if your were admitted for syncope or siezures until you have seen by Primary MD or a Neurologist and advised to do so again.  Do not drive when taking Pain medications.    Do not take more than prescribed Pain, Sleep and Anxiety Medications  Special Instructions: If you have smoked or chewed Tobacco  in  the last 2 yrs please stop smoking, stop any regular Alcohol  and or any Recreational drug use.  Wear Seat belts while driving.   Please note  You were cared for by a hospitalist during your hospital stay. If you have any questions about your discharge medications or the care you received while you were in the hospital after you are discharged, you can call the unit and asked to speak with the hospitalist on call if the hospitalist that took care of you is not available. Once you are discharged, your primary care physician will handle any further medical issues. Please note that NO REFILLS for any discharge medications will be authorized once you are discharged, as it is imperative that you return to your primary care physician (or establish a relationship with a primary care physician if you do not have one) for your aftercare needs so that they can reassess your need for medications and monitor your lab values.

## 2015-10-29 NOTE — Progress Notes (Signed)
Discharge instructions given to pt, verbalized understanding. Awaiting transportation for home.

## 2015-10-31 LAB — CULTURE, BLOOD (ROUTINE X 2)
Culture: NO GROWTH
Culture: NO GROWTH

## 2015-11-17 ENCOUNTER — Encounter: Payer: Self-pay | Admitting: Family

## 2015-11-24 ENCOUNTER — Ambulatory Visit: Payer: Medicare Other | Admitting: Family

## 2015-11-24 ENCOUNTER — Encounter (HOSPITAL_COMMUNITY): Payer: Medicare Other

## 2015-12-15 ENCOUNTER — Ambulatory Visit: Payer: Medicare Other | Admitting: Podiatry

## 2015-12-15 ENCOUNTER — Telehealth: Payer: Self-pay | Admitting: *Deleted

## 2015-12-15 NOTE — Telephone Encounter (Signed)
Pt states she received a call from the foot doctor last night when she wasn't home.  Please call.

## 2015-12-21 ENCOUNTER — Encounter: Payer: Self-pay | Admitting: Podiatry

## 2015-12-21 ENCOUNTER — Ambulatory Visit (INDEPENDENT_AMBULATORY_CARE_PROVIDER_SITE_OTHER): Payer: Medicare Other | Admitting: Podiatry

## 2015-12-21 DIAGNOSIS — B351 Tinea unguium: Secondary | ICD-10-CM | POA: Diagnosis not present

## 2015-12-21 DIAGNOSIS — M79675 Pain in left toe(s): Secondary | ICD-10-CM

## 2015-12-21 NOTE — Progress Notes (Signed)
Patient ID: [name], female   DOB: 1928/07/13, 82 y.o.   MRN: [id number]  Subjective: This patient presents today at her request complaining of painful toenails on her left foot request nail debridement  Objective: Orientated 3 Amputation right lower extremity The toenails are elongated, hypertrophic, discolored, deformed 1-5 left No open skin lesions bilaterally Small keratoses plantar left hallux  Assessment: Symptomatic onychomycoses 1-5 keratoses 1 History of peripheral arterial disease  Plan: Debridement toenails 1-5 mechanically electronically without any bleeding Debrided keratoses 1 without any bleeding  Reappoint 3 months

## 2015-12-22 ENCOUNTER — Other Ambulatory Visit: Payer: Self-pay | Admitting: Internal Medicine

## 2015-12-22 DIAGNOSIS — Z1231 Encounter for screening mammogram for malignant neoplasm of breast: Secondary | ICD-10-CM

## 2015-12-22 DIAGNOSIS — H903 Sensorineural hearing loss, bilateral: Secondary | ICD-10-CM | POA: Insufficient documentation

## 2016-01-12 ENCOUNTER — Ambulatory Visit (INDEPENDENT_AMBULATORY_CARE_PROVIDER_SITE_OTHER): Payer: Medicare Other | Admitting: Pulmonary Disease

## 2016-01-12 ENCOUNTER — Encounter: Payer: Self-pay | Admitting: Pulmonary Disease

## 2016-01-12 VITALS — BP 122/70 | HR 63 | Ht 64.0 in | Wt 147.0 lb

## 2016-01-12 DIAGNOSIS — C3432 Malignant neoplasm of lower lobe, left bronchus or lung: Secondary | ICD-10-CM | POA: Diagnosis not present

## 2016-01-12 DIAGNOSIS — J449 Chronic obstructive pulmonary disease, unspecified: Secondary | ICD-10-CM

## 2016-01-12 NOTE — Assessment & Plan Note (Signed)
Surveillance CT scan planned for August 2017 She probably had minimal radiation pneumonitis noted is an infiltrate left lower lobe on her x-ray in 09/2015

## 2016-01-12 NOTE — Progress Notes (Signed)
Subjective:    Patient ID: [name], female    DOB: 1927/12/14, 82 y.o.   MRN: [id number]  HPI   Chief Complaint  Patient presents with  . Pulmonary Consult    Referred by Dr. Marlou Sa for lung cancer.    82 yobf quit smoking 2011 referred 03/14/13 to pulmonary clinic ([facility]) by Dr Terrence Dupont for eval of  T1N1 dz RUL Mass  She underwent right upper lobectomy with lymph node dissection by Dr. Servando Snare on 06/16/2013. Tumor size was 3.5 CM   She then underwent status post a stereotactic radiotherapy to left lower lobe hypermetabolic pulmonary nodule by Dr. Tammi Klippel completed 07/19/2015.  post treatment CT scan on 09/28/15 showed improvement of her left lower lobe lesion from 9 mm to 8 mm. she was hospitalized 09/2015 for left lower lobe infiltrate-which could simply have been related to radiation pneumonitis  She smoked 30 pack years before she quit in 2011    Significant tests/ events  spirometry 04/18/2013 > FEV1 1.08 (84%) ratio 54  Spirometry 12/2015 showed FEV1 of 0.88-63%, FVC of 98% with a ratio of 50   Past Medical History  Diagnosis Date  . Stroke ([place])   . Arthritis   . Hypertension   . Hyperlipidemia   . Glaucoma   . Peripheral vascular disease, unspecified ([place])     with Claudication  . COPD GOLD I 04/18/2013  . Shortness of breath   . Cancer ([place])     rt lung  . Lung cancer ([place]) 06/07/2013  . S/P radiation therapy 12/12, 12/15, and 07/19/15    The LLL nodule was treated to 54 Gy in 3 fractions of 18 Gy    Past Surgical History  Procedure Laterality Date  . Above knee leg amputation      Right  . Angioplasty / stenting iliac  03/22/09    Aortogram-  left common iliac artery  . Hemorrhoid surgery    . Tonsillectomy    . Colonoscopy  Feb. 2013  . Video bronchoscopy Bilateral 04/10/2013    Procedure: VIDEO BRONCHOSCOPY WITH FLUORO;  Surgeon: Tanda Rockers, MD;  Location: [facility];  Service: Cardiopulmonary;  Laterality: Bilateral;  . Video bronchoscopy  N/A 06/16/2013    Procedure: VIDEO BRONCHOSCOPY;  Surgeon: [name], MD;  Location: [facility] OR;  Service: Thoracic;  Laterality: N/A;  . Video assisted thoracoscopy (vats)/wedge resection Right 06/16/2013    Procedure: VIDEO ASSISTED THORACOSCOPY (VATS)/WEDGE RESECTION;  Surgeon: [name], MD;  Location: [facility];  Service: Thoracic;  Laterality: Right;     Allergies  Allergen Reactions  . Aspirin Anaphylaxis  . Morphine And Related Other (See Comments)    confusion     Social History   Social History  . Marital Status: Widowed    Spouse Name: N/A  . Number of Children: 1  . Years of Education: N/A   Occupational History  . Retired    Social History Main Topics  . Smoking status: Former Smoker -- 0.50 packs/day for 60 years    Types: Cigarettes    Quit date: 07/31/2009  . Smokeless tobacco: Never Used  . Alcohol Use: No  . Drug Use: No  . Sexual Activity: No   Other Topics Concern  . Not on file   Social History Narrative   Pt lives alone. She is HOH, especially left ear.    Family History  Problem Relation Age of Onset  . Stroke Brother   . Stomach cancer Mother   .  Asthma Sister   . Colon cancer Brother      Review of Systems  Constitutional: Negative for fever and unexpected weight change.  HENT: Negative for congestion, dental problem, ear pain, nosebleeds, postnasal drip, rhinorrhea, sinus pressure, sneezing, sore throat and trouble swallowing.   Eyes: Negative for redness and itching.  Respiratory: Positive for shortness of breath. Negative for cough, chest tightness and wheezing.   Cardiovascular: Negative for palpitations and leg swelling.  Gastrointestinal: Negative for nausea and vomiting.  Genitourinary: Negative for dysuria.  Musculoskeletal: Negative for joint swelling.  Skin: Negative for rash.  Neurological: Negative for headaches.  Hematological: Does not bruise/bleed easily.  Psychiatric/Behavioral: Negative for dysphoric mood.  The patient is not nervous/anxious.        Objective:   Physical Exam  Gen. Pleasant, elderly, well-nourished, in no distress ENT - no lesions, no post nasal drip Neck: No JVD, no thyromegaly, no carotid bruits Lungs: no use of accessory muscles, no dullness to percussion, decreased bilateral without rales or rhonchi  Cardiovascular: Rhythm regular, heart sounds  normal, no murmurs or gallops, no peripheral edema Musculoskeletal: No deformities, no cyanosis or clubbing        Assessment & Plan:

## 2016-01-12 NOTE — Patient Instructions (Signed)
You have COPD - trial of Stiolto - call us for Rx if this works

## 2016-01-12 NOTE — Assessment & Plan Note (Signed)
have COPD - trial of Stiolto - call us for Rx if this works Consider pulmonary rehabilitation in the future

## 2016-02-02 ENCOUNTER — Telehealth: Payer: Self-pay | Admitting: Pulmonary Disease

## 2016-02-02 NOTE — Telephone Encounter (Signed)
Spoke with pt. At her last appointment RA started her on Stiolto. This medication is not working for her. Reports wheezing, SOB, chest tightness and coughing. Cough is non productive. Would like recommendations on what to do from here.  MW - please advise as RA is working 11pm [employer].

## 2016-02-02 NOTE — Telephone Encounter (Signed)
Spoke with pt and advised that per MW he would like her to come in to office and be seen. She states that due to transportation rules she has to give them a 3-day notice in order to get a ride. She wanted to schedule for next week, so appt was made for 02/07/16. Pt advised that if symptoms increase she needs to go to ED for evaluation. Also reviewed albuterol HFA instructions and use with pt. Nothing further needed.

## 2016-02-02 NOTE — Telephone Encounter (Signed)
Ok to add on to today's or Friday's 7/717 schedule but must bring all active meds with her - nothing to suggest over the phone

## 2016-02-07 ENCOUNTER — Ambulatory Visit: Payer: Medicare Other | Admitting: Internal Medicine

## 2016-02-15 ENCOUNTER — Ambulatory Visit (INDEPENDENT_AMBULATORY_CARE_PROVIDER_SITE_OTHER): Payer: Medicare Other | Admitting: Emergency Medicine

## 2016-02-15 ENCOUNTER — Encounter: Payer: Self-pay | Admitting: Emergency Medicine

## 2016-02-15 VITALS — BP 122/72 | HR 68 | Ht 64.0 in | Wt 142.8 lb

## 2016-02-15 DIAGNOSIS — R059 Cough, unspecified: Secondary | ICD-10-CM | POA: Insufficient documentation

## 2016-02-15 DIAGNOSIS — J449 Chronic obstructive pulmonary disease, unspecified: Secondary | ICD-10-CM | POA: Diagnosis not present

## 2016-02-15 DIAGNOSIS — R05 Cough: Secondary | ICD-10-CM | POA: Diagnosis not present

## 2016-02-15 DIAGNOSIS — C3432 Malignant neoplasm of lower lobe, left bronchus or lung: Secondary | ICD-10-CM | POA: Diagnosis not present

## 2016-02-15 NOTE — Progress Notes (Signed)
History of Present Illness [name] is a 82 y.o. female former smoker ( Quit 2011) with COPD Gold Stage 1, cancer of the RUL with right upper lobectomy 08/2012. She has a LLL nodule that has been treated with radiation and is stable.   7/18/2017Acute Visit: Pt returns to office with complaints of cough and voice changes since she started on [facility] 01/12/2016. She called the office 02/01/2016 and she was told to stop the medication and come for an office visit. She has had some transportation issues, and this is the earliest she could come. She states that she has had cough and voice change and sore throat. She is coughing up clear secretions . She denies chest pain, fever, purulent secretions ,orthopnea or hemoptysis. She states her cough is worse at night, and wakes her from sleep. She has not been treating the cough, just takes the occasional tylenol for pain. She has been compliant with her Pro Air, but is having to use it every 6 hours. She will need a maintenance medication for her COPD.  Tests spirometry 04/18/2013 > FEV1 1.08 (84%) ratio 54  Spirometry 12/2015 showed FEV1 of 0.88-63%, FVC of 98% with a ratio of 50  Past medical hx Past Medical History  Diagnosis Date  . Stroke ([place])   . Arthritis   . Hypertension   . Hyperlipidemia   . Glaucoma   . Peripheral vascular disease, unspecified ([place])     with Claudication  . COPD GOLD I 04/18/2013  . Shortness of breath   . Cancer ([place])     rt lung  . Lung cancer ([place]) 06/07/2013  . S/P radiation therapy 12/12, 12/15, and 07/19/15    The LLL nodule was treated to 54 Gy in 3 fractions of 18 Gy     Past surgical hx, Family hx, Social hx all reviewed.  Current Outpatient Prescriptions on File Prior to Visit  Medication Sig  . acetaminophen (TYLENOL) 500 MG tablet Take 1,000 mg by mouth every 6 (six) hours as needed for mild pain, moderate pain or headache.   . albuterol (PROAIR HFA) 108 (90 BASE) MCG/ACT inhaler Inhale 2 puffs  into the lungs every 6 (six) hours as needed for wheezing (wheezing).   Marland Kitchen amLODipine (NORVASC) 2.5 MG tablet Take 2.5 mg by mouth daily.   . Cholecalciferol (VITAMIN D3) 1000 units CAPS Take 1 capsule by mouth daily.  . clopidogrel (PLAVIX) 75 MG tablet Take 75 mg by mouth every Monday, Wednesday, and Friday.   . losartan (COZAAR) 50 MG tablet Take 50 mg by mouth daily.   . meclizine (ANTIVERT) 25 MG tablet Take 25 mg by mouth 3 (three) times daily as needed for dizziness (dizziness). For dizziness  . omeprazole (PRILOSEC) 20 MG capsule Take 20 mg by mouth daily before breakfast.   . pravastatin (PRAVACHOL) 20 MG tablet Take 20 mg by mouth at bedtime.   . traMADol (ULTRAM) 50 MG tablet Take 50 mg by mouth every 8 (eight) hours as needed for moderate pain or severe pain (pain).    No current facility-administered medications on file prior to visit.     Allergies  Allergen Reactions  . Aspirin Anaphylaxis  . Morphine And Related Other (See Comments)    confusion    Review Of Systems:  Constitutional:   No  weight loss, night sweats,  Fevers, chills, fatigue, or  lassitude.  HEENT:   No headaches,  Difficulty swallowing,  Tooth/dental problems, or  + Sore throat,  No sneezing, itching, ear ache, nasal congestion, post nasal drip,   CV:  No chest pain,  Orthopnea, PND, swelling in lower extremities, anasarca, dizziness, palpitations, syncope.   GI  No heartburn, + indigestion, no abdominal pain, nausea, vomiting, diarrhea, change in bowel habits, loss of appetite, bloody stools.   Resp: + shortness of breath with exertion less at rest.  No excess mucus, no productive cough,  No non-productive cough,  No coughing up of blood.  No change in color of mucus.  No wheezing.  No chest wall deformity  Skin: no rash or lesions.  GU: no dysuria, change in color of urine, no urgency or frequency.  No flank pain, no hematuria   MS:  No joint pain or swelling.  No decreased range of  motion.  No back pain.  Psych:  No change in mood or affect. No depression or anxiety.  No memory loss.   Vital Signs BP 122/72 mmHg  Pulse 68  Ht '5\' 4"'$  (1.626 m)  Wt 142 lb 12.8 oz (64.774 kg)  BMI 24.50 kg/m2  SpO2 97%   Physical Exam:  General- No distress,  A&Ox3 , frail female ENT: No sinus tenderness, TM clear, pale nasal mucosa, no oral exudate,no post nasal drip, no LAN Cardiac: S1, S2, regular rate and rhythm, no murmur Chest: No wheeze/ rales/ dullness; no accessory muscle use, no nasal flaring, no sternal retractions Abd.: Soft Non-tender Ext: No clubbing cyanosis, edema Neuro:  normal strength Skin: No rashes, warm and dry Psych: normal mood and behavior   Assessment/Plan  Cough Upper Airway reactive cough likely 2/2 new exposure to [place]: We will treat you with a prednisone taper. Prednisone taper; 10 mg tablets: 4 tabs x 2 days, 3 tabs x 2 days, 2 tabs x 2 days 1 tab x 2 days then stop. Sips of water instead of throat clearing. Use sugar free jolly ranchers or sugar free lemon heads to soothe your throat. Delsym 1-2 teaspoons every 12 hours for cough. Add  Pepcid 20 mg at bedtime.`( generic is fine)  Follow up with Dr. Elsworth Soho in 4 weeks .  Please contact office for sooner follow up if symptoms do not improve or worsen or seek emergency care  At Follow up in 4 weeks re-evaluate new maintenance medication for COPD due to frequent Rescue Inhaler use. Recommend repeat PFT's when patient returns to her baseline status.  COPD GOLD I COPD Stage 1 Plan: Stop Stialto ( Done 74/42017) Continue your Pro Air inhaler as needed for SOB and wheezing up to every 6 hours. Consider another maintenance medication once current cough is improved. Consider repeat PFT's to re-evaluate staging of COPD.  Cancer of lower lobe of left lung ([place]) Last CT indiicates stable lung nodule Plan: CT Chest for follow up this August as scheduled.     Magdalen Spatz,  NP 02/15/2016  3:59 PM   Attending Note:  I have examined patient, reviewed labs, studies and notes. I have discussed the case with [name], and I agree with the data and plans as amended above. 82 year old woman followed by Dr. Elsworth Soho for a history of COPD and also recurrent adenocarcinoma of the lung. She has a history of a right upper lobectomy and has more recently received stereotactic radiation to the left lower lobe nodule. He presents today with an upper airway cough syndrome and significant upper airway irritation after recently starting Stiolto. I don't see any evidence of thrush. We will plan to treat  her with a brief steroid taper, treat occult GERD empirically, hold off on scheduled bronchodilator until her upper airway irritation resolves. She will then follow-up with Dr. Elsworth Soho and can consider another scheduled inhaler. She also likely needs repeat pulmonary function testing but we will defer these until her acute illness has resolved.   Baltazar Apo, MD, PhD 02/15/2016, 4:26 PM [facility] [phone] or if no answer [phone]

## 2016-02-15 NOTE — Patient Instructions (Addendum)
It is nice to meet you today. We will treat you with a prednisone taper. Prednisone taper; 10 mg tablets: 4 tabs x 2 days, 3 tabs x 2 days, 2 tabs x 2 days 1 tab x 2 days then stop. Sips of water instead of throat clearing. Use sugar free jolly ranchers or sugar free lemon heads to soothe your throat. Delsym 1-2 teaspoons every 12 hours for cough. Add  Pepcid 20 mg at bedtime.`( generic is fine)  CT Chest this August as scheduled. Follow up with Dr. Elsworth Soho in 4 weeks .  Please contact office for sooner follow up if symptoms do not improve or worsen or seek emergency care

## 2016-02-15 NOTE — Assessment & Plan Note (Signed)
COPD Stage 1 Plan: Stop Stialto ( Done 74/42017) Continue your Pro Air inhaler as needed for SOB and wheezing up to every 6 hours. Consider another maintenance medication once current cough is improved. Consider repeat PFT's to re-evaluate staging of COPD.

## 2016-02-15 NOTE — Assessment & Plan Note (Signed)
Upper Airway reactive cough likely 2/2 new exposure to [place]: We will treat you with a prednisone taper. Prednisone taper; 10 mg tablets: 4 tabs x 2 days, 3 tabs x 2 days, 2 tabs x 2 days 1 tab x 2 days then stop. Sips of water instead of throat clearing. Use sugar free jolly ranchers or sugar free lemon heads to soothe your throat. Delsym 1-2 teaspoons every 12 hours for cough. Add  Pepcid 20 mg at bedtime.`( generic is fine)  Follow up with Dr. Elsworth Soho in 4 weeks .  Please contact office for sooner follow up if symptoms do not improve or worsen or seek emergency care  At Follow up in 4 weeks re-evaluate new maintenance medication for COPD due to frequent Rescue Inhaler use. Recommend repeat PFT's when patient returns to her baseline status.

## 2016-02-15 NOTE — Assessment & Plan Note (Signed)
Last CT indiicates stable lung nodule Plan: CT Chest for follow up this August as scheduled.

## 2016-02-16 ENCOUNTER — Telehealth: Payer: Self-pay | Admitting: Pulmonary Disease

## 2016-02-16 MED ORDER — PREDNISONE 10 MG PO TABS: ORAL_TABLET | ORAL | Status: DC

## 2016-02-16 NOTE — Telephone Encounter (Signed)
Per 02/15/16 OV: Patient Instructions       It is nice to meet you today. We will treat you with a prednisone taper. Prednisone taper; 10 mg tablets: 4 tabs x 2 days, 3 tabs x 2 days, 2 tabs x 2 days 1 tab x 2 days then stop. Sips of water instead of throat clearing. Use sugar free jolly ranchers or sugar free lemon heads to soothe your throat. Delsym 1-2 teaspoons every 12 hours for cough. Add  Pepcid 20 mg at bedtime.`( generic is fine)   CT Chest this August as scheduled. Follow up with Dr. Elsworth Soho in 4 weeks .   Please contact office for sooner follow up if symptoms do not improve or worsen or seek emergency care   ---  [facility] for michelle. RX for pred sent in.

## 2016-02-16 NOTE — Telephone Encounter (Signed)
Spoke with Sharyn Lull and apologized that rx was not sent yesterday  I advised that it was sent this am  Nothing further needed

## 2016-03-02 ENCOUNTER — Encounter: Payer: Self-pay | Admitting: Family

## 2016-03-02 ENCOUNTER — Ambulatory Visit: Payer: Medicare Other

## 2016-03-02 ENCOUNTER — Ambulatory Visit: Payer: Medicare Other | Admitting: Pulmonary Disease

## 2016-03-06 ENCOUNTER — Ambulatory Visit
Admission: RE | Admit: 2016-03-06 | Discharge: 2016-03-06 | Disposition: A | Discharge status: Home / Self Care | Payer: Medicare Other | Source: Ambulatory Visit | Attending: Internal Medicine | Admitting: Internal Medicine

## 2016-03-06 DIAGNOSIS — Z1231 Encounter for screening mammogram for malignant neoplasm of breast: Secondary | ICD-10-CM

## 2016-03-08 ENCOUNTER — Ambulatory Visit (HOSPITAL_COMMUNITY)
Admission: RE | Admit: 2016-03-08 | Discharge: 2016-03-08 | Disposition: A | Discharge status: Home / Self Care | Payer: Medicare Other | Source: Ambulatory Visit | Attending: Family | Admitting: Family

## 2016-03-08 ENCOUNTER — Ambulatory Visit (INDEPENDENT_AMBULATORY_CARE_PROVIDER_SITE_OTHER)
Admission: RE | Admit: 2016-03-08 | Discharge: 2016-03-08 | Disposition: A | Discharge status: Home / Self Care | Payer: Medicare Other | Source: Ambulatory Visit | Attending: Family | Admitting: Family

## 2016-03-08 ENCOUNTER — Ambulatory Visit (INDEPENDENT_AMBULATORY_CARE_PROVIDER_SITE_OTHER): Payer: Medicare Other | Admitting: Family

## 2016-03-08 ENCOUNTER — Encounter: Payer: Self-pay | Admitting: Family

## 2016-03-08 VITALS — BP 176/78 | HR 59 | Temp 97.2°F | Resp 16 | Ht 64.0 in | Wt 137.0 lb

## 2016-03-08 DIAGNOSIS — I10 Essential (primary) hypertension: Secondary | ICD-10-CM | POA: Diagnosis not present

## 2016-03-08 DIAGNOSIS — I771 Stricture of artery: Secondary | ICD-10-CM

## 2016-03-08 DIAGNOSIS — R0989 Other specified symptoms and signs involving the circulatory and respiratory systems: Secondary | ICD-10-CM

## 2016-03-08 DIAGNOSIS — Z95828 Presence of other vascular implants and grafts: Secondary | ICD-10-CM | POA: Diagnosis present

## 2016-03-08 DIAGNOSIS — I739 Peripheral vascular disease, unspecified: Secondary | ICD-10-CM

## 2016-03-08 DIAGNOSIS — I6523 Occlusion and stenosis of bilateral carotid arteries: Secondary | ICD-10-CM | POA: Diagnosis not present

## 2016-03-08 DIAGNOSIS — Z87891 Personal history of nicotine dependence: Secondary | ICD-10-CM

## 2016-03-08 DIAGNOSIS — E785 Hyperlipidemia, unspecified: Secondary | ICD-10-CM | POA: Diagnosis not present

## 2016-03-08 DIAGNOSIS — I779 Disorder of arteries and arterioles, unspecified: Secondary | ICD-10-CM

## 2016-03-08 DIAGNOSIS — Z89611 Acquired absence of right leg above knee: Secondary | ICD-10-CM

## 2016-03-08 LAB — VAS US CAROTID
Left CCA dist dias: -12 cm/s
Left CCA dist sys: -52 cm/s
Left CCA prox dias: 9 cm/s
Left CCA prox sys: 96 cm/s
Left ICA dist dias: -21 cm/s
Left ICA dist sys: -85 cm/s
Left ICA prox dias: 13 cm/s
Left ICA prox sys: 106 cm/s
RIGHT CCA MID DIAS: -11 cm/s
RIGHT ECA DIAS: -7 cm/s
Right CCA prox dias: 7 cm/s
Right CCA prox sys: 87 cm/s
Right cca dist sys: -80 cm/s

## 2016-03-08 NOTE — Patient Instructions (Signed)
Peripheral Vascular Disease Peripheral vascular disease (PVD) is a disease of the blood vessels that are not part of your heart and brain. A simple term for PVD is poor circulation. In most cases, PVD narrows the blood vessels that carry blood from your heart to the rest of your body. This can result in a decreased supply of blood to your arms, legs, and internal organs, like your stomach or kidneys. However, it most often affects a person's lower legs and feet. There are two types of PVD.  Organic PVD. This is the more common type. It is caused by damage to the structure of blood vessels.  Functional PVD. This is caused by conditions that make blood vessels contract and tighten (spasm). Without treatment, PVD tends to get worse over time. PVD can also lead to acute ischemic limb. This is when an arm or limb suddenly has trouble getting enough blood. This is a medical emergency. CAUSES Each type of PVD has many different causes. The most common cause of PVD is buildup of a fatty material (plaque) inside of your arteries (atherosclerosis). Small amounts of plaque can break off from the walls of the blood vessels and become lodged in a smaller artery. This blocks blood flow and can cause acute ischemic limb. Other common causes of PVD include:  Blood clots that form inside of blood vessels.  Injuries to blood vessels.  Diseases that cause inflammation of blood vessels or cause blood vessel spasms.  Health behaviors and health history that increase your risk of developing PVD. RISK FACTORS  You may have a greater risk of PVD if you:  Have a family history of PVD.  Have certain medical conditions, including:  High cholesterol.  Diabetes.  High blood pressure (hypertension).  Coronary heart disease.  Past problems with blood clots.  Past injury, such as burns or a broken bone. These may have damaged blood vessels in your limbs.  Buerger disease. This is caused by inflamed blood  vessels in your hands and feet.  Some forms of arthritis.  Rare birth defects that affect the arteries in your legs.  Use tobacco.  Do not get enough exercise.  Are obese.  Are age 50 or older. SIGNS AND SYMPTOMS  PVD may cause many different symptoms. Your symptoms depend on what part of your body is not getting enough blood. Some common signs and symptoms include:  Cramps in your lower legs. This may be a symptom of poor leg circulation (claudication).  Pain and weakness in your legs while you are physically active that goes away when you rest (intermittent claudication).  Leg pain when at rest.  Leg numbness, tingling, or weakness.  Coldness in a leg or foot, especially when compared with the other leg.  Skin or hair changes. These can include:  Hair loss.  Shiny skin.  Pale or bluish skin.  Thick toenails.  Inability to get or maintain an erection (erectile dysfunction). People with PVD are more prone to developing ulcers and sores on their toes, feet, or legs. These may take longer than normal to heal. DIAGNOSIS Your health care provider may diagnose PVD from your signs and symptoms. The health care provider will also do a physical exam. You may have tests to find out what is causing your PVD and determine its severity. Tests may include:  Blood pressure recordings from your arms and legs and measurements of the strength of your pulses (pulse volume recordings).  Imaging studies using sound waves to take pictures of   the blood flow through your blood vessels (Doppler ultrasound).  Injecting a dye into your blood vessels before having imaging studies using:  X-rays (angiogram or arteriogram).  Computer-generated X-rays (CT angiogram).  A powerful electromagnetic field and a computer (magnetic resonance angiogram or MRA). TREATMENT Treatment for PVD depends on the cause of your condition and the severity of your symptoms. It also depends on your age. Underlying  causes need to be treated and controlled. These include long-lasting (chronic) conditions, such as diabetes, high cholesterol, and high blood pressure. You may need to first try making lifestyle changes and taking medicines. Surgery may be needed if these do not work. Lifestyle changes may include:  Quitting smoking.  Exercising regularly.  Following a low-fat, low-cholesterol diet. Medicines may include:  Blood thinners to prevent blood clots.  Medicines to improve blood flow.  Medicines to improve your blood cholesterol levels. Surgical procedures may include:  A procedure that uses an inflated balloon to open a blocked artery and improve blood flow (angioplasty).  A procedure to put in a tube (stent) to keep a blocked artery open (stent implant).  Surgery to reroute blood flow around a blocked artery (peripheral bypass surgery).  Surgery to remove dead tissue from an infected wound on the affected limb.  Amputation. This is surgical removal of the affected limb. This may be necessary in cases of acute ischemic limb that are not improved through medical or surgical treatments. HOME CARE INSTRUCTIONS  Take medicines only as directed by your health care provider.  Do not use any tobacco products, including cigarettes, chewing tobacco, or electronic cigarettes. If you need help quitting, ask your health care provider.  Lose weight if you are overweight, and maintain a healthy weight as directed by your health care provider.  Eat a diet that is low in fat and cholesterol. If you need help, ask your health care provider.  Exercise regularly. Ask your health care provider to suggest some good activities for you.  Use compression stockings or other mechanical devices as directed by your health care provider.  Take good care of your feet.  Wear comfortable shoes that fit well.  Check your feet often for any cuts or sores. SEEK MEDICAL CARE IF:  You have cramps in your legs  while walking.  You have leg pain when you are at rest.  You have coldness in a leg or foot.  Your skin changes.  You have erectile dysfunction.  You have cuts or sores on your feet that are not healing. SEEK IMMEDIATE MEDICAL CARE IF:  Your arm or leg turns cold and blue.  Your arms or legs become red, warm, swollen, painful, or numb.  You have chest pain or trouble breathing.  You suddenly have weakness in your face, arm, or leg.  You become very confused or lose the ability to speak.  You suddenly have a very bad headache or lose your vision.   This information is not intended to replace advice given to you by your health care provider. Make sure you discuss any questions you have with your health care provider.   Document Released: 08/24/2004 Document Revised: 08/07/2014 Document Reviewed: 12/25/2013 Elsevier Interactive Patient Education 2016 [employer]  

## 2016-03-08 NOTE — Progress Notes (Signed)
Chief Complaint: Follow up Extracranial Carotid Artery Stenosis   History of Present Illness  [name] is a 82 y.o. female patient of Dr. Scot Dock who has undergone previous left common iliac artery stenting in August of 2010. She has known infrainguinal arterial occlusive disease. She is status post previous right above-the-knee amputation and is ambulatory with her prosthesis.  She returns today for follow up.  She does have some pain in the muscle of her left leg when walking what seems to be short distance with her walker.  She also complains of occasional right stump pain.  Her prosthesis is not working properly and is causing her to fall; states she has discussed with the [facility] representative who told her that any attempt to improve would make things worse, per pt report. She denies non healing ulcers on lower extremities.  She had a VAT wedge resection right lung for cancer, November, 2014, states she is undergoing radiation treatments; she also reports it was recently found that she has a tumor in her left lung. 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 She sees a podiatrist every 3-4 months.  Patient states her blood pressure at home [phone], is elevated today. Home Health visits her periodically.  She states she was told that she had 3 TIA's, does not recall any symptoms, this was possibly in 2010.  She lives in an assisted living facility.  Pt Diabetic: No  Pt smoker: quit in 2011   Pt meds include:  Statin :Yes  Betablocker: No  ASA: No, severe allergic reaction Other anticoagulants/antiplatelets:Plavix   Past Medical History:  Diagnosis Date  . Arthritis   . Cancer ([place])    rt lung  . COPD GOLD I 04/18/2013  . Glaucoma   . Hyperlipidemia   . Hypertension   . Lung cancer ([place]) 06/07/2013  . Peripheral vascular disease, unspecified ([place])    with Claudication  . S/P radiation therapy 12/12, 12/15, and 07/19/15   The LLL  nodule was treated to 54 Gy in 3 fractions of 18 Gy  . Shortness of breath   . Stroke [facility])     Social History Social History  Substance Use Topics  . Smoking status: Former Smoker    Packs/day: 0.50    Years: 60.00    Types: Cigarettes    Quit date: 07/31/2009  . Smokeless tobacco: Never Used  . Alcohol use No    Family History Family History  Problem Relation Age of Onset  . Stroke Brother   . Stomach cancer Mother   . Asthma Sister   . Colon cancer Brother     Surgical History Past Surgical History:  Procedure Laterality Date  . ABOVE KNEE LEG AMPUTATION     Right  . ANGIOPLASTY / STENTING ILIAC  03/22/09   Aortogram-  left common iliac artery  . COLONOSCOPY  Feb. 2013  . HEMORRHOID SURGERY    . TONSILLECTOMY    . VIDEO ASSISTED THORACOSCOPY (VATS)/WEDGE RESECTION Right 06/16/2013   Procedure: VIDEO ASSISTED THORACOSCOPY (VATS)/WEDGE RESECTION;  Surgeon: [name], MD;  Location: [facility];  Service: Thoracic;  Laterality: Right;  Marland Kitchen VIDEO BRONCHOSCOPY Bilateral 04/10/2013   Procedure: VIDEO BRONCHOSCOPY WITH FLUORO;  Surgeon: Tanda Rockers, MD;  Location: [facility];  Service: Cardiopulmonary;  Laterality: Bilateral;  . VIDEO BRONCHOSCOPY N/A 06/16/2013   Procedure: VIDEO BRONCHOSCOPY;  Surgeon: [name], MD;  Location: [facility] OR;  Service: Thoracic;  Laterality: N/A;    Allergies  Allergen Reactions  . Aspirin Anaphylaxis  . Morphine And Related Other (See Comments)    confusion    Current Outpatient Prescriptions  Medication Sig Dispense Refill  . acetaminophen (TYLENOL) 500 MG tablet Take 1,000 mg by mouth every 6 (six) hours as needed for mild pain, moderate pain or headache.     . albuterol (PROAIR HFA) 108 (90 BASE) MCG/ACT inhaler Inhale 2 puffs into the lungs every 6 (six) hours as needed for wheezing (wheezing).     Marland Kitchen amLODipine (NORVASC) 2.5 MG tablet Take 2.5 mg by mouth daily.   3  . Cholecalciferol (VITAMIN D3) 1000 units CAPS Take 1  capsule by mouth daily.    . clopidogrel (PLAVIX) 75 MG tablet Take 75 mg by mouth every Monday, Wednesday, and Friday.     . losartan (COZAAR) 50 MG tablet Take 50 mg by mouth daily.     . meclizine (ANTIVERT) 25 MG tablet Take 25 mg by mouth 3 (three) times daily as needed for dizziness (dizziness). For dizziness    . omeprazole (PRILOSEC) 20 MG capsule Take 20 mg by mouth daily before breakfast.     . pravastatin (PRAVACHOL) 20 MG tablet Take 20 mg by mouth at bedtime.     . predniSONE (DELTASONE) 10 MG tablet Take 4 tabs daily x 2 days, 3 tabs daily x 2 days, 2 tabs daily x 2 days, 1 tab daily x 2 days then stop 20 tablet 0  . traMADol (ULTRAM) 50 MG tablet Take 50 mg by mouth every 8 (eight) hours as needed for moderate pain or severe pain (pain).      No current facility-administered medications for this visit.     Review of Systems : See HPI for pertinent positives and negatives.  Physical Examination  Vitals:   03/08/16 0944 03/08/16 0956 03/08/16 0957  BP: (!) 206/75 (!) 190/81 (!) 176/78  Pulse: (!) 58 (!) 59 (!) 59  Resp: 16    Temp: 97.2 F (36.2 C)    SpO2: 100%    Weight: 137 lb (62.1 kg)    Height: '5\' 4"'$  (1.626 m)     Body mass index is 23.52 kg/m.  General: A&O x 3, WDWN,  Gait: with walker  Eyes: PERRLA,  Pulmonary: CTAB, without wheezes , rales or rhonchi  Cardiac: regular Rythm , no murmur detected  Carotid Bruits  Left  Right    Negative  positive  VASCULAR EXAM:  Extremities: left leg without ischemic changes, right AKA with prosthesis in place.  No open wounds.   LE Pulses  LEFT  RIGHT   FEMORAL  1+ palpable  not palpable   POPLITEAL  not palpable  AKA   POSTERIOR TIBIAL  palpable  AKA   DORSALIS PEDIS  ANTERIOR TIBIAL  not palpable  AKA    Abdomen: soft, NT, no masses palpated. Aortic pulse is not palpable.  Skin: no rashes, no ulcers. Musculoskeletal: no muscle wasting or atrophy   Neurologic: A&O X 3; Appropriate Affect,  MOTOR FUNCTION: moving all extremities equally. Speech is fluent/normal. Hard of hearing.    Assessment: [name] is a 82 y.o. female who is s/p left common iliac artery stenting in August of 2010. She has known infrainguinal arterial occlusive disease. She is status post previous right above-the-knee amputation and is ambulatory with her prosthesis.   She has no signs of ischemia in her left foot/leg. Pt states her current right AKA prosthetic is causing her to fall, is ill  fitting; states she has tried working with the Paediatric nurse and was told that if they try to adjust her prosthetic it will be worse. Will refer her to [facility] for evaluation and fitting of a new right AKA prosthetic.  DATA: Today's left iliac artery stent duplex suggests a technically difficult exam due to bowel gas. Evidence suggestive of a patent left CIA stent based on limited visualization with biphasic left iliac waveforms. Right AKA. Left ABI remains stable at 66% arterial perfusion, monophasic waveforms. No significant change compared to exam on 05/12/15.  Carotid duplex suggests <40% bilateral ICA stenoses. No significant change compared to exam of 11/11/14.   Pt states she did not take her blood pressure medications today yet. I advised her to do so and to see her PCP as soon as possible re her uncontrolled hypertension.    Plan: Follow-up in 1 year with left aortoiliac duplex and ABI, 2 years with Carotid Duplex scan, see me afterward for discussion of results and evaluation. Refer to [employer] for multiple problems with right AKA prosthesis.    I discussed in depth with the patient the nature of atherosclerosis, and emphasized the importance of maximal medical management including strict control of blood pressure, blood glucose, and lipid levels, obtaining regular exercise, and continued cessation of smoking.  The patient is aware that without  maximal medical management the underlying atherosclerotic disease process will progress, limiting the benefit of any interventions. The patient was given information about stroke prevention and what symptoms should prompt the patient to seek immediate medical care. Thank you for allowing [place] to participate in this patient's care.  Clemon Chambers, RN, MSN, FNP-C [facility] Office: [phone]  Clinic Physician: Scot Dock  03/08/16 10:10 AM

## 2016-03-15 ENCOUNTER — Telehealth: Payer: Self-pay | Admitting: Internal Medicine

## 2016-03-15 NOTE — Telephone Encounter (Signed)
MOVED F/U FROM 9/13 TO 9/12. SPOKE WITH PATIENT SHE IS AWARE. ALSO CONFIRMED 9/6 LAB/CT.

## 2016-03-28 ENCOUNTER — Telehealth: Payer: Self-pay | Admitting: *Deleted

## 2016-03-28 NOTE — Telephone Encounter (Signed)
Pt states she received a call from our office, while in a doctor's appt, and wanted to make sure she still had an appt 03/29/2016.

## 2016-03-29 ENCOUNTER — Encounter: Payer: Self-pay | Admitting: Podiatry

## 2016-03-29 ENCOUNTER — Ambulatory Visit (INDEPENDENT_AMBULATORY_CARE_PROVIDER_SITE_OTHER): Payer: Medicare Other | Admitting: Podiatry

## 2016-03-29 DIAGNOSIS — B351 Tinea unguium: Secondary | ICD-10-CM

## 2016-03-29 DIAGNOSIS — I739 Peripheral vascular disease, unspecified: Secondary | ICD-10-CM

## 2016-03-29 DIAGNOSIS — M79675 Pain in left toe(s): Secondary | ICD-10-CM | POA: Diagnosis not present

## 2016-03-29 DIAGNOSIS — L84 Corns and callosities: Secondary | ICD-10-CM

## 2016-03-29 NOTE — Patient Instructions (Signed)
Diabetes and Foot Care Diabetes may cause you to have problems because of poor blood supply (circulation) to your feet and legs. This may cause the skin on your feet to become thinner, break easier, and heal more slowly. Your skin may become dry, and the skin may peel and crack. You may also have nerve damage in your legs and feet causing decreased feeling in them. You may not notice minor injuries to your feet that could lead to infections or more serious problems. Taking care of your feet is one of the most important things you can do for yourself.  HOME CARE INSTRUCTIONS  Wear shoes at all times, even in the house. Do not go barefoot. Bare feet are easily injured.  Check your feet daily for blisters, cuts, and redness. If you cannot see the bottom of your feet, use a mirror or ask someone for help.  Wash your feet with warm water (do not use hot water) and mild soap. Then pat your feet and the areas between your toes until they are completely dry. Do not soak your feet as this can dry your skin.  Apply a moisturizing lotion or petroleum jelly (that does not contain alcohol and is unscented) to the skin on your feet and to dry, brittle toenails. Do not apply lotion between your toes.  Trim your toenails straight across. Do not dig under them or around the cuticle. File the edges of your nails with an emery board or nail file.  Do not cut corns or calluses or try to remove them with medicine.  Wear clean socks or stockings every day. Make sure they are not too tight. Do not wear knee-high stockings since they may decrease blood flow to your legs.  Wear shoes that fit properly and have enough cushioning. To break in new shoes, wear them for just a few hours a day. This prevents you from injuring your feet. Always look in your shoes before you put them on to be sure there are no objects inside.  Do not cross your legs. This may decrease the blood flow to your feet.  If you find a minor scrape,  cut, or break in the skin on your feet, keep it and the skin around it clean and dry. These areas may be cleansed with mild soap and water. Do not cleanse the area with peroxide, alcohol, or iodine.  When you remove an adhesive bandage, be sure not to damage the skin around it.  If you have a wound, look at it several times a day to make sure it is healing.  Do not use heating pads or hot water bottles. They may burn your skin. If you have lost feeling in your feet or legs, you may not know it is happening until it is too late.  Make sure your health care provider performs a complete foot exam at least annually or more often if you have foot problems. Report any cuts, sores, or bruises to your health care provider immediately. SEEK MEDICAL CARE IF:   You have an injury that is not healing.  You have cuts or breaks in the skin.  You have an ingrown nail.  You notice redness on your legs or feet.  You feel burning or tingling in your legs or feet.  You have pain or cramps in your legs and feet.  Your legs or feet are numb.  Your feet always feel cold. SEEK IMMEDIATE MEDICAL CARE IF:   There is increasing redness,   swelling, or pain in or around a wound.  There is a red line that goes up your leg.  Pus is coming from a wound.  You develop a fever or as directed by your health care provider.  You notice a bad smell coming from an ulcer or wound.   This information is not intended to replace advice given to you by your health care provider. Make sure you discuss any questions you have with your health care provider.   Document Released: 07/14/2000 Document Revised: 03/19/2013 Document Reviewed: 12/24/2012 Elsevier Interactive Patient Education 2016 Elsevier Inc.  

## 2016-03-29 NOTE — Progress Notes (Signed)
Patient ID: [name], female   DOB: [birth year], 82 y.o.   MRN: Subjective: This patient presents today at her request complaining of painful toenails on her left foot request nail debridement  Objective: Orientated 3 Amputation right lower extremity DP pulse left 0/4 PT pulses 0/4 Capillary reflex delayed left The toenails are elongated, hypertrophic, discolored, deformed 1-5 left No open skin lesions bilaterally Small keratoses plantar left hallux  Assessment: Symptomatic onychomycoses 1-5  keratoses 1 f peripheral arterial disease  Plan: Debridement toenails 1-5 mechanically electronically without any bleeding Debrided keratoses 1 without any bleeding  Reappoint 3 months      [phone]

## 2016-04-04 ENCOUNTER — Other Ambulatory Visit: Payer: Self-pay | Admitting: Family

## 2016-04-04 DIAGNOSIS — I739 Peripheral vascular disease, unspecified: Secondary | ICD-10-CM

## 2016-04-04 DIAGNOSIS — I771 Stricture of artery: Secondary | ICD-10-CM

## 2016-04-05 ENCOUNTER — Ambulatory Visit (HOSPITAL_COMMUNITY)
Admission: RE | Admit: 2016-04-05 | Discharge: 2016-04-05 | Disposition: A | Discharge status: Home / Self Care | Payer: Medicare Other | Source: Ambulatory Visit | Attending: Internal Medicine | Admitting: Internal Medicine

## 2016-04-05 ENCOUNTER — Other Ambulatory Visit (HOSPITAL_BASED_OUTPATIENT_CLINIC_OR_DEPARTMENT_OTHER): Payer: Medicare Other

## 2016-04-05 DIAGNOSIS — C3432 Malignant neoplasm of lower lobe, left bronchus or lung: Secondary | ICD-10-CM | POA: Diagnosis not present

## 2016-04-05 DIAGNOSIS — J439 Emphysema, unspecified: Secondary | ICD-10-CM | POA: Insufficient documentation

## 2016-04-05 DIAGNOSIS — Z85118 Personal history of other malignant neoplasm of bronchus and lung: Secondary | ICD-10-CM | POA: Diagnosis not present

## 2016-04-05 DIAGNOSIS — R918 Other nonspecific abnormal finding of lung field: Secondary | ICD-10-CM | POA: Insufficient documentation

## 2016-04-05 LAB — COMPREHENSIVE METABOLIC PANEL
ALT: <9 U/L (ref 0–55)
AST: 17 U/L (ref 5–34)
Albumin: 3.4 g/dL — ABNORMAL LOW (ref 3.5–5.0)
Alkaline Phosphatase: 71 U/L (ref 40–150)
Anion Gap: 11 mEq/L (ref 3–11)
BUN: 9.4 mg/dL (ref 7.0–26.0)
CO2: 23 mEq/L (ref 22–29)
Calcium: 9.5 mg/dL (ref 8.4–10.4)
Chloride: 107 mEq/L (ref 98–109)
Creatinine: 1 mg/dL (ref 0.6–1.1)
EGFR: 60 mL/min/{1.73_m2} — ABNORMAL LOW (ref >90)
Glucose: 101 mg/dl (ref 70–140)
Potassium: 4.4 mEq/L (ref 3.5–5.1)
Sodium: 142 mEq/L (ref 136–145)
Total Bilirubin: 0.41 mg/dL (ref 0.20–1.20)
Total Protein: 7.3 g/dL (ref 6.4–8.3)

## 2016-04-05 LAB — CBC WITH DIFFERENTIAL/PLATELET
BASO%: 1 % (ref 0.0–2.0)
Basophils Absolute: 0.1 10*3/uL (ref 0.0–0.1)
EOS%: 1 % (ref 0.0–7.0)
Eosinophils Absolute: 0.1 10*3/uL (ref 0.0–0.5)
HCT: 41.4 % (ref 34.8–46.6)
HGB: 13.3 g/dL (ref 11.6–15.9)
LYMPH%: 44.9 % (ref 14.0–49.7)
MCH: 28.8 pg (ref 25.1–34.0)
MCHC: 32 g/dL (ref 31.5–36.0)
MCV: 89.9 fL (ref 79.5–101.0)
MONO#: 0.4 10*3/uL (ref 0.1–0.9)
MONO%: 7.7 % (ref 0.0–14.0)
NEUT#: 2.4 10*3/uL (ref 1.5–6.5)
NEUT%: 45.4 % (ref 38.4–76.8)
Platelets: 287 10*3/uL (ref 145–400)
RBC: 4.61 10*6/uL (ref 3.70–5.45)
RDW: 13.9 % (ref 11.2–14.5)
WBC: 5.4 10*3/uL (ref 3.9–10.3)
lymph#: 2.4 10*3/uL (ref 0.9–3.3)

## 2016-04-05 MED ORDER — IOPAMIDOL (ISOVUE-300) INJECTION 61%
75.0000 mL | Freq: Once | INTRAVENOUS | Status: AC | PRN
  Administered 2016-04-05: 75 mL via INTRAVENOUS

## 2016-04-11 ENCOUNTER — Ambulatory Visit (HOSPITAL_BASED_OUTPATIENT_CLINIC_OR_DEPARTMENT_OTHER): Payer: Medicare Other | Admitting: Internal Medicine

## 2016-04-11 ENCOUNTER — Telehealth: Payer: Self-pay | Admitting: Internal Medicine

## 2016-04-11 ENCOUNTER — Encounter: Payer: Self-pay | Admitting: Internal Medicine

## 2016-04-11 VITALS — BP 168/66 | HR 62 | Temp 98.4°F | Resp 17 | Ht 64.0 in | Wt 145.2 lb

## 2016-04-11 DIAGNOSIS — Z85118 Personal history of other malignant neoplasm of bronchus and lung: Secondary | ICD-10-CM | POA: Diagnosis not present

## 2016-04-11 DIAGNOSIS — C3432 Malignant neoplasm of lower lobe, left bronchus or lung: Secondary | ICD-10-CM

## 2016-04-11 NOTE — Progress Notes (Signed)
[name] [phone]   Fax:(336) [phone]  OFFICE PROGRESS NOTE  No PCP Per Patient No address on file  DIAGNOSIS: Stage IB (T2a, N0, M0) non-small cell lung cancer consistent with moderately differentiated adenocarcinoma diagnosed in September of 2014.   PRIOR THERAPY:  1) Bronchoscopy, right video-assisted thoracoscopy, minithoracotomy, right upper lobectomy with lymph node dissection under the care of Dr. Servando Snare on 06/16/2013. Tumor size was 3.5 CM and the myriad genetics showed high risk for recurrence. 2) status post a stereotactic radiotherapy to left lower lobe pulmonary nodule under the care of Dr. Tammi Klippel completed 07/19/2015.  CURRENT THERAPY: Observation.  INTERVAL HISTORY: [name] 82 y.o. female returns to the clinic today for follow-up visit. The patient is feeling fine today with no specific complaints. She continues to have arthralgia in the shoulders bilaterally. She also has some pain from the new leg prosthesis. Her blood pressure is still uncontrolled and she is followed by Dr. Marlou Sa and Dr. Terrence Dupont. The patient denied having any significant weight loss or night sweats. She has no chest pain or hemoptysis. The patient denied having any significant fever or chills. She had a recent CT scan of the chest and she is here for evaluation and discussion of her scan results.  MEDICAL HISTORY: Past Medical History:  Diagnosis Date  . Arthritis   . Cancer ([place])    rt lung  . COPD GOLD I 04/18/2013  . Glaucoma   . Hyperlipidemia   . Hypertension   . Lung cancer ([place]) 06/07/2013  . Peripheral vascular disease, unspecified ([place])    with Claudication  . S/P radiation therapy 12/12, 12/15, and 07/19/15   The LLL nodule was treated to 54 Gy in 3 fractions of 18 Gy  . Shortness of breath   . Stroke [facility])     ALLERGIES:  is allergic to aspirin and morphine and related.  MEDICATIONS:  Current Outpatient Prescriptions  Medication Sig  Dispense Refill  . acetaminophen (TYLENOL) 500 MG tablet Take 1,000 mg by mouth every 6 (six) hours as needed for mild pain, moderate pain or headache.     . albuterol (PROAIR HFA) 108 (90 BASE) MCG/ACT inhaler Inhale 2 puffs into the lungs every 6 (six) hours as needed for wheezing (wheezing).     Marland Kitchen amLODipine (NORVASC) 2.5 MG tablet Take 2.5 mg by mouth daily.   3  . Cholecalciferol (VITAMIN D3) 1000 units CAPS Take 1 capsule by mouth daily.    . clopidogrel (PLAVIX) 75 MG tablet Take 75 mg by mouth every Monday, Wednesday, and Friday.     . losartan (COZAAR) 50 MG tablet Take 50 mg by mouth daily.     . meclizine (ANTIVERT) 25 MG tablet Take 25 mg by mouth 3 (three) times daily as needed for dizziness (dizziness). For dizziness    . omeprazole (PRILOSEC) 20 MG capsule Take 20 mg by mouth daily before breakfast.     . pravastatin (PRAVACHOL) 20 MG tablet Take 20 mg by mouth at bedtime.     . predniSONE (DELTASONE) 10 MG tablet Take 4 tabs daily x 2 days, 3 tabs daily x 2 days, 2 tabs daily x 2 days, 1 tab daily x 2 days then stop 20 tablet 0  . traMADol (ULTRAM) 50 MG tablet Take 50 mg by mouth every 8 (eight) hours as needed for moderate pain or severe pain (pain).      No current facility-administered medications for this visit.  SURGICAL HISTORY:  Past Surgical History:  Procedure Laterality Date  . ABOVE KNEE LEG AMPUTATION     Right  . ANGIOPLASTY / STENTING ILIAC  03/22/09   Aortogram-  left common iliac artery  . COLONOSCOPY  Feb. 2013  . HEMORRHOID SURGERY    . TONSILLECTOMY    . VIDEO ASSISTED THORACOSCOPY (VATS)/WEDGE RESECTION Right 06/16/2013   Procedure: VIDEO ASSISTED THORACOSCOPY (VATS)/WEDGE RESECTION;  Surgeon: [name], MD;  Location: [facility];  Service: Thoracic;  Laterality: Right;  Marland Kitchen VIDEO BRONCHOSCOPY Bilateral 04/10/2013   Procedure: VIDEO BRONCHOSCOPY WITH FLUORO;  Surgeon: Tanda Rockers, MD;  Location: [facility];  Service: Cardiopulmonary;   Laterality: Bilateral;  . VIDEO BRONCHOSCOPY N/A 06/16/2013   Procedure: VIDEO BRONCHOSCOPY;  Surgeon: [name], MD;  Location: [facility] OR;  Service: Thoracic;  Laterality: N/A;    REVIEW OF SYSTEMS:  A comprehensive review of systems was negative except for: Constitutional: positive for fatigue Musculoskeletal: positive for arthralgias   PHYSICAL EXAMINATION: General appearance: alert, cooperative, fatigued and no distress Head: Normocephalic, without obvious abnormality, atraumatic Neck: no adenopathy, no JVD, supple, symmetrical, trachea midline and thyroid not enlarged, symmetric, no tenderness/mass/nodules Lymph nodes: Cervical, supraclavicular, and axillary nodes normal. Resp: clear to auscultation bilaterally Back: symmetric, no curvature. ROM normal. No CVA tenderness. Cardio: regular rate and rhythm, S1, S2 normal, no murmur, click, rub or gallop GI: soft, non-tender; bowel sounds normal; no masses,  no organomegaly Extremities: extremities normal, atraumatic, no cyanosis or edema  ECOG PERFORMANCE STATUS: 1 - Symptomatic but completely ambulatory  Blood pressure (!) 173/58, pulse 62, temperature 98.4 F (36.9 C), temperature source Oral, resp. rate 17, height 5' 4"  (1.626 m), weight 145 lb 3.2 oz (65.9 kg), SpO2 98 %.  LABORATORY DATA: Lab Results  Component Value Date   WBC 5.4 04/05/2016   HGB 13.3 04/05/2016   HCT 41.4 04/05/2016   MCV 89.9 04/05/2016   PLT 287 04/05/2016      Chemistry      Component Value Date/Time   NA 142 04/05/2016 1009   K 4.4 04/05/2016 1009   CL 110 10/28/2015 0520   CO2 23 04/05/2016 1009   BUN 9.4 04/05/2016 1009   CREATININE 1.0 04/05/2016 1009      Component Value Date/Time   CALCIUM 9.5 04/05/2016 1009   ALKPHOS 71 04/05/2016 1009   AST 17 04/05/2016 1009   ALT <9 04/05/2016 1009   BILITOT 0.41 04/05/2016 1009       RADIOGRAPHIC STUDIES: Ct Chest W Contrast  Result Date: 04/05/2016 CLINICAL DATA:  Lung cancer.   Followup. EXAM: CT CHEST WITH CONTRAST TECHNIQUE: Multidetector CT imaging of the chest was performed during intravenous contrast administration. CONTRAST:  54m ISOVUE-300 IOPAMIDOL (ISOVUE-300) INJECTION 61% COMPARISON:  09/28/2015 FINDINGS: Cardiovascular: The heart size appears normal. There is no pericardial effusion identified. Aortic atherosclerosis identified. Calcification within the RCA, LAD and left circumflex coronary artery noted. Mediastinum/Nodes: The trachea appears patent and is midline. Normal appearance of the esophagus. No mediastinal or hilar adenopathy identified. There is no axillary or supraclavicular adenopathy. Lungs/Pleura: No pleural fluid identified. Postoperative appearance of the right lung compatible with previous right upper lobectomy. Mild changes of centrilobular emphysema. There is diffuse bronchial wall thickening. Left lower lobe pulmonary nodule is scratch set the previously referenced left lower lobe pulmonary nodule is obscured by respiratory motion artifact. There is a bandlike area of parenchymal scarring within this area which likely reflects changes of external beam radiation. No new pulmonary  nodules or masses identified. Upper Abdomen: Hepatic steatosis noted. The adrenal glands are normal. Unremarkable appearance of the kidneys and spleen. Musculoskeletal: There is no aggressive lytic or sclerotic bone lesion identified. No suspicious bone abnormalities noted. IMPRESSION: 1. No specific features identified to suggest residual or recurrent tumor or progressive disease. 2. Previously referenced left lower lobe pulmonary nodule is obscured by motion artifact. There is a parenchymal scarring in this area which likely reflects changes of external beam radiation. New from previous exam. 3. Emphysema Electronically Signed   By: Kerby Moors M.D.   On: 04/05/2016 14:32    ASSESSMENT AND PLAN: This is a very pleasant 82 years old African-American female with history of  stage IB non-small cell lung cancer, adenocarcinoma status post right upper lobectomy with lymph node dissection and has been observation since November 2014. Restaging CT scan of the chest showed suspicious left lower lobe pulmonary nodule concerning for metastatic disease versus second lung primary. She also underwent stereotactic body radiotherapy to the left lower lobe pulmonary nodule by radiation oncology. The most recent CT scan of the chest performed on 04/05/2016 showed no findings concerning for disease progression. I discussed the scan results with the patient today. I recommended for her to continue on observation with repeat CT scan of the chest in 6 months. For hypertension, the patient was advised to take her blood pressure medication as a scheduled and also to discuss with her primary care physician adjustment of her medication. The patient was advised to call immediately if she has any concerning symptoms in the interval. The patient voices understanding of current disease status and treatment options and is in agreement with the current care plan.  All questions were answered. The patient knows to call the clinic with any problems, questions or concerns. We can certainly see the patient much sooner if necessary.  Disclaimer: This note was dictated with voice recognition software. Similar sounding words can inadvertently be transcribed and may not be corrected upon review.

## 2016-04-11 NOTE — Telephone Encounter (Signed)
Gave patient avs report and appointments for March. Central radiology will call re scan - patient aware.

## 2016-04-12 ENCOUNTER — Ambulatory Visit: Payer: Medicare Other | Admitting: Internal Medicine

## 2016-04-18 ENCOUNTER — Ambulatory Visit: Payer: Medicare Other | Admitting: Pulmonary Disease

## 2016-04-19 ENCOUNTER — Ambulatory Visit: Payer: Medicare Other | Admitting: Pulmonary Disease

## 2016-05-02 ENCOUNTER — Ambulatory Visit: Payer: Medicare Other | Admitting: Pulmonary Disease

## 2016-05-05 ENCOUNTER — Encounter: Payer: Self-pay | Admitting: Pulmonary Disease

## 2016-05-05 ENCOUNTER — Ambulatory Visit (INDEPENDENT_AMBULATORY_CARE_PROVIDER_SITE_OTHER): Payer: Medicare Other | Admitting: Pulmonary Disease

## 2016-05-05 DIAGNOSIS — J449 Chronic obstructive pulmonary disease, unspecified: Secondary | ICD-10-CM | POA: Diagnosis not present

## 2016-05-05 DIAGNOSIS — Z23 Encounter for immunization: Secondary | ICD-10-CM | POA: Diagnosis not present

## 2016-05-05 DIAGNOSIS — C3432 Malignant neoplasm of lower lobe, left bronchus or lung: Secondary | ICD-10-CM | POA: Diagnosis not present

## 2016-05-05 NOTE — Assessment & Plan Note (Signed)
Flu shot today If breathing gets worse we will consider starting a different kind of inhaler Stay on pro-air as needed Allergies reaction to stiolto- consider anoro

## 2016-05-05 NOTE — Progress Notes (Signed)
   Subjective:    Patient ID: [name], female    DOB: 01/16/1928, 82 y.o.   MRN: [id number]  HPI  50 yobf quit smoking 2011 For follow-up of COPD and lung cancer  referred 03/14/13 to pulmonary clinic ([facility]) by Dr Terrence Dupont for eval of  T1N1 dz RUL Mass  She underwent right upper lobectomy with lymph node dissection by Dr. Servando Snare on 06/16/2013. Tumor size was 3.5 CM   She then underwent  stereotactic radiotherapy to left lower lobe hypermetabolic pulmonary nodule by Dr. Tammi Klippel completed 07/19/2015.  she was hospitalized 09/2015 for left lower lobe infiltrate-which could simply have been related to radiation pneumonitis  She smoked 30 pack years before she quit in 2011  05/05/2016  Chief Complaint  Patient presents with  . Follow-up    breathing is better, vocal chord issues.     She was given stiolto last visit as maintenance medication. She developed hoarseness and cough, hence this was stopped -she followed up a month later and was given prednisone Dosepak Her breathing is much better, voice is improving She uses pro-air as needed for dyspnea while dressing in the morning We reviewed follow-up scan from 03/2016   Significant tests/ events  CT chest 03/2016 - LLL scarring, no recurrence of tumor  spirometry 04/18/2013 > FEV1 1.08 (84%) ratio 54  Spirometry 12/2015 showed FEV1 of 0.88-63%, FVC of 98% with a ratio of 50  Review of Systems neg for any significant sore throat, dysphagia, itching, sneezing, nasal congestion or excess/ purulent secretions, fever, chills, sweats, unintended wt loss, pleuritic or exertional cp, hempoptysis, orthopnea pnd or change in chronic leg swelling.   Also denies presyncope, palpitations, heartburn, abdominal pain, nausea, vomiting, diarrhea or change in bowel or urinary habits, dysuria,hematuria, rash, arthralgias, visual complaints, headache, numbness weakness or ataxia.     Objective:   Physical Exam  Gen. Pleasant,  elderly,well-nourished, in no distress, walker ENT - no lesions, no post nasal drip Neck: No JVD, no thyromegaly, no carotid bruits Lungs: no use of accessory muscles, no dullness to percussion, clear without rales or rhonchi  Cardiovascular: Rhythm regular, heart sounds  normal, no murmurs or gallops, no peripheral edema Musculoskeletal: No deformities, no cyanosis or clubbing        Assessment & Plan:

## 2016-05-05 NOTE — Patient Instructions (Signed)
Flu shot today If breathing gets worse we will consider starting a different kind of inhaler

## 2016-05-05 NOTE — Assessment & Plan Note (Signed)
No evidence of recurrence or radiation pneumonitis

## 2016-06-28 ENCOUNTER — Encounter: Payer: Self-pay | Admitting: Podiatry

## 2016-06-28 ENCOUNTER — Ambulatory Visit (INDEPENDENT_AMBULATORY_CARE_PROVIDER_SITE_OTHER): Payer: Medicare Other | Admitting: Podiatry

## 2016-06-28 VITALS — BP 165/84 | HR 63 | Resp 18

## 2016-06-28 DIAGNOSIS — Q828 Other specified congenital malformations of skin: Secondary | ICD-10-CM

## 2016-06-28 DIAGNOSIS — M79675 Pain in left toe(s): Secondary | ICD-10-CM

## 2016-06-28 DIAGNOSIS — B351 Tinea unguium: Secondary | ICD-10-CM | POA: Diagnosis not present

## 2016-06-28 DIAGNOSIS — I739 Peripheral vascular disease, unspecified: Secondary | ICD-10-CM | POA: Diagnosis not present

## 2016-06-28 DIAGNOSIS — L84 Corns and callosities: Secondary | ICD-10-CM

## 2016-06-28 NOTE — Patient Instructions (Signed)
Purchase extra-depth shoes to accommodate the hammertoes on the left foot Okay to continue with over-the-counter Plastizote insoles

## 2016-06-28 NOTE — Progress Notes (Signed)
Patient ID: [name], female   DOB: 30-Jun-1928, 82 y.o.   MRN: [id number]   Subjective: This patient presents today at her request complaining of painful toenails on her left foot request nail debridement. The patient's 8 is present in the treatment room  Objective: Orientated 3 Amputation right lower extremity DP pulse left 0/4 PT pulses 0/4 Capillary reflex delayed left Sensation to 10 g monofilament wire intact 2/5 left Vibratory sensation nonreactive left Ankle reflex reactive left Hammertoe 2-5 left Keratoses distal second and third left toes and medial plantar left hallux The toenails are elongated, hypertrophic, discolored, deformed 1-5 left No open skin lesions bilaterally Small keratoses plantar left hallux  Assessment: Symptomatic onychomycoses 1-5  keratoses x 3  peripheral arterial disease  Plan: Debridement toenails 1-5 mechanically electronically without any bleeding Debrided keratoses 3 without any bleeding  Recommend extra-depth shoes with soft insole  Reappoint 3 months

## 2016-08-07 ENCOUNTER — Ambulatory Visit: Payer: Medicare Other | Admitting: Adult Health

## 2016-09-27 ENCOUNTER — Ambulatory Visit (INDEPENDENT_AMBULATORY_CARE_PROVIDER_SITE_OTHER): Payer: Medicare Other | Admitting: Podiatry

## 2016-09-27 ENCOUNTER — Encounter: Payer: Self-pay | Admitting: Podiatry

## 2016-09-27 VITALS — BP 157/87 | HR 60 | Resp 18

## 2016-09-27 DIAGNOSIS — I739 Peripheral vascular disease, unspecified: Secondary | ICD-10-CM

## 2016-09-27 DIAGNOSIS — B351 Tinea unguium: Secondary | ICD-10-CM | POA: Diagnosis not present

## 2016-09-27 DIAGNOSIS — M79675 Pain in left toe(s): Secondary | ICD-10-CM

## 2016-09-27 DIAGNOSIS — L84 Corns and callosities: Secondary | ICD-10-CM

## 2016-09-27 DIAGNOSIS — Q828 Other specified congenital malformations of skin: Secondary | ICD-10-CM

## 2016-09-27 NOTE — Progress Notes (Signed)
Patient ID: [name], female   DOB: 1928-03-26, 83 y.o.   MRN: [id number]    Subjective: This patient presents today at her request complaining of painful toenails on her left foot request nail debridement. The patient's 8 is present in the treatment room  Objective: Orientated 3 Amputation right lower extremity DP pulse left 0/4 PT pulses 0/4 Capillary reflex delayed left Sensation to 10 g monofilament wire intact 2/5 left Vibratory sensation nonreactive left Ankle reflex reactive left Hammertoe 2-5 left Patient uses walker Manual motor testing dorsi flexion, plantar flexion 5/5 left Keratoses distal second and third left toes and medial plantar left hallux The toenails are elongated, hypertrophic, discolored, deformed 1-5 left No open skin lesions bilaterally Small keratoses plantar left hallux  Assessment: Symptomatic onychomycoses 1-5 keratoses x 3  peripheral arterial disease  Plan: Debridement toenails 1-5 mechanically electronically without any bleeding Debrided keratoses 3 without any bleeding  Recommend extra-depth shoes with soft insole  Reappoint 3 months

## 2016-10-02 ENCOUNTER — Other Ambulatory Visit (HOSPITAL_BASED_OUTPATIENT_CLINIC_OR_DEPARTMENT_OTHER): Payer: Medicare Other

## 2016-10-02 DIAGNOSIS — Z85118 Personal history of other malignant neoplasm of bronchus and lung: Secondary | ICD-10-CM | POA: Diagnosis not present

## 2016-10-02 DIAGNOSIS — C3432 Malignant neoplasm of lower lobe, left bronchus or lung: Secondary | ICD-10-CM

## 2016-10-02 LAB — COMPREHENSIVE METABOLIC PANEL
ALT: 11 U/L (ref 0–55)
AST: 15 U/L (ref 5–34)
Albumin: 3.4 g/dL — ABNORMAL LOW (ref 3.5–5.0)
Alkaline Phosphatase: 66 U/L (ref 40–150)
Anion Gap: 9 mEq/L (ref 3–11)
BUN: 16 mg/dL (ref 7.0–26.0)
CO2: 23 mEq/L (ref 22–29)
Calcium: 9.4 mg/dL (ref 8.4–10.4)
Chloride: 108 mEq/L (ref 98–109)
Creatinine: 1 mg/dL (ref 0.6–1.1)
EGFR: 56 mL/min/{1.73_m2} — ABNORMAL LOW (ref >90)
Glucose: 115 mg/dl (ref 70–140)
Potassium: 4.5 mEq/L (ref 3.5–5.1)
Sodium: 140 mEq/L (ref 136–145)
Total Bilirubin: 0.4 mg/dL (ref 0.20–1.20)
Total Protein: 6.5 g/dL (ref 6.4–8.3)

## 2016-10-02 LAB — CBC WITH DIFFERENTIAL/PLATELET
BASO%: 0.5 % (ref 0.0–2.0)
Basophils Absolute: 0 10*3/uL (ref 0.0–0.1)
EOS%: 0.8 % (ref 0.0–7.0)
Eosinophils Absolute: 0 10*3/uL (ref 0.0–0.5)
HCT: 41.8 % (ref 34.8–46.6)
HGB: 13.8 g/dL (ref 11.6–15.9)
LYMPH%: 38.9 % (ref 14.0–49.7)
MCH: 30 pg (ref 25.1–34.0)
MCHC: 33 g/dL (ref 31.5–36.0)
MCV: 91.1 fL (ref 79.5–101.0)
MONO#: 0.5 10*3/uL (ref 0.1–0.9)
MONO%: 8.3 % (ref 0.0–14.0)
NEUT#: 2.9 10*3/uL (ref 1.5–6.5)
NEUT%: 51.5 % (ref 38.4–76.8)
Platelets: 239 10*3/uL (ref 145–400)
RBC: 4.59 10*6/uL (ref 3.70–5.45)
RDW: 15.8 % — ABNORMAL HIGH (ref 11.2–14.5)
WBC: 5.6 10*3/uL (ref 3.9–10.3)
lymph#: 2.2 10*3/uL (ref 0.9–3.3)

## 2016-10-09 ENCOUNTER — Other Ambulatory Visit: Payer: Medicare Other

## 2016-10-09 ENCOUNTER — Ambulatory Visit: Payer: Medicare Other | Admitting: Internal Medicine

## 2016-10-09 ENCOUNTER — Ambulatory Visit (HOSPITAL_COMMUNITY): Admission: RE | Admit: 2016-10-09 | Payer: Medicare Other | Source: Ambulatory Visit

## 2016-10-16 ENCOUNTER — Ambulatory Visit (HOSPITAL_COMMUNITY)
Admission: RE | Admit: 2016-10-16 | Discharge: 2016-10-16 | Disposition: A | Discharge status: Home / Self Care | Payer: Medicare Other | Source: Ambulatory Visit | Attending: Internal Medicine | Admitting: Internal Medicine

## 2016-10-16 DIAGNOSIS — I700 Atherosclerosis of aorta: Secondary | ICD-10-CM | POA: Insufficient documentation

## 2016-10-16 DIAGNOSIS — J439 Emphysema, unspecified: Secondary | ICD-10-CM | POA: Insufficient documentation

## 2016-10-16 DIAGNOSIS — Z85118 Personal history of other malignant neoplasm of bronchus and lung: Secondary | ICD-10-CM | POA: Insufficient documentation

## 2016-10-16 DIAGNOSIS — I2510 Atherosclerotic heart disease of native coronary artery without angina pectoris: Secondary | ICD-10-CM | POA: Diagnosis not present

## 2016-10-16 DIAGNOSIS — Z9889 Other specified postprocedural states: Secondary | ICD-10-CM | POA: Diagnosis not present

## 2016-10-16 DIAGNOSIS — C3432 Malignant neoplasm of lower lobe, left bronchus or lung: Secondary | ICD-10-CM

## 2016-11-06 ENCOUNTER — Telehealth: Payer: Self-pay | Admitting: Internal Medicine

## 2016-11-06 ENCOUNTER — Telehealth: Payer: Self-pay | Admitting: *Deleted

## 2016-11-06 NOTE — Telephone Encounter (Signed)
"  I was there a Monday weeks ago.  When does Dr. Julien Nordmann want me to come back?"  [phone]."    Scans performed 10-16-2016.  F/U on 3 -06-2017 needed to be rescheduled.  Collaborative notified and scheduling message sent.

## 2016-11-06 NOTE — Telephone Encounter (Signed)
Spoke with patient re lab/fu 4/23

## 2016-11-07 ENCOUNTER — Emergency Department (HOSPITAL_COMMUNITY): Payer: Medicare Other

## 2016-11-07 ENCOUNTER — Emergency Department (HOSPITAL_COMMUNITY)
Admission: EM | Admit: 2016-11-07 | Discharge: 2016-11-07 | Disposition: A | Discharge status: Home / Self Care | Payer: Medicare Other | Attending: Emergency Medicine | Admitting: Emergency Medicine

## 2016-11-07 ENCOUNTER — Encounter (HOSPITAL_COMMUNITY): Payer: Self-pay | Admitting: Emergency Medicine

## 2016-11-07 DIAGNOSIS — M545 Low back pain, unspecified: Secondary | ICD-10-CM

## 2016-11-07 DIAGNOSIS — Z85118 Personal history of other malignant neoplasm of bronchus and lung: Secondary | ICD-10-CM | POA: Diagnosis not present

## 2016-11-07 DIAGNOSIS — R109 Unspecified abdominal pain: Secondary | ICD-10-CM | POA: Insufficient documentation

## 2016-11-07 DIAGNOSIS — Z87891 Personal history of nicotine dependence: Secondary | ICD-10-CM | POA: Diagnosis not present

## 2016-11-07 DIAGNOSIS — Z79899 Other long term (current) drug therapy: Secondary | ICD-10-CM | POA: Insufficient documentation

## 2016-11-07 DIAGNOSIS — Z8673 Personal history of transient ischemic attack (TIA), and cerebral infarction without residual deficits: Secondary | ICD-10-CM | POA: Diagnosis not present

## 2016-11-07 DIAGNOSIS — M549 Dorsalgia, unspecified: Secondary | ICD-10-CM | POA: Diagnosis present

## 2016-11-07 DIAGNOSIS — Z7902 Long term (current) use of antithrombotics/antiplatelets: Secondary | ICD-10-CM | POA: Diagnosis not present

## 2016-11-07 DIAGNOSIS — J449 Chronic obstructive pulmonary disease, unspecified: Secondary | ICD-10-CM | POA: Insufficient documentation

## 2016-11-07 DIAGNOSIS — I10 Essential (primary) hypertension: Secondary | ICD-10-CM | POA: Diagnosis not present

## 2016-11-07 LAB — COMPREHENSIVE METABOLIC PANEL
ALT: 12 U/L — ABNORMAL LOW (ref 14–54)
AST: 22 U/L (ref 15–41)
Albumin: 3.7 g/dL (ref 3.5–5.0)
Alkaline Phosphatase: 62 U/L (ref 38–126)
Anion gap: 7 (ref 5–15)
BUN: 12 mg/dL (ref 6–20)
CO2: 25 mmol/L (ref 22–32)
Calcium: 9.2 mg/dL (ref 8.9–10.3)
Chloride: 110 mmol/L (ref 101–111)
Creatinine, Ser: 0.94 mg/dL (ref 0.44–1.00)
GFR calc Af Amer: >60 mL/min (ref >60)
GFR calc non Af Amer: 53 mL/min — ABNORMAL LOW (ref >60)
Glucose, Bld: 101 mg/dL — ABNORMAL HIGH (ref 65–99)
Potassium: 4.7 mmol/L (ref 3.5–5.1)
Sodium: 142 mmol/L (ref 135–145)
Total Bilirubin: 0.7 mg/dL (ref 0.3–1.2)
Total Protein: 7 g/dL (ref 6.5–8.1)

## 2016-11-07 LAB — URINALYSIS, ROUTINE W REFLEX MICROSCOPIC
Bilirubin Urine: NEGATIVE
Glucose, UA: NEGATIVE mg/dL
Hgb urine dipstick: NEGATIVE
Ketones, ur: NEGATIVE mg/dL
Leukocytes, UA: NEGATIVE
Nitrite: NEGATIVE
Protein, ur: NEGATIVE mg/dL
Specific Gravity, Urine: 1.019 (ref 1.005–1.030)
pH: 5 (ref 5.0–8.0)

## 2016-11-07 LAB — CBC WITH DIFFERENTIAL/PLATELET
Basophils Absolute: 0 10*3/uL (ref 0.0–0.1)
Basophils Relative: 0 %
Eosinophils Absolute: 0.1 10*3/uL (ref 0.0–0.7)
Eosinophils Relative: 1 %
HCT: 39.8 % (ref 36.0–46.0)
Hemoglobin: 13.1 g/dL (ref 12.0–15.0)
Lymphocytes Relative: 46 %
Lymphs Abs: 2.4 10*3/uL (ref 0.7–4.0)
MCH: 29.2 pg (ref 26.0–34.0)
MCHC: 32.9 g/dL (ref 30.0–36.0)
MCV: 88.6 fL (ref 78.0–100.0)
Monocytes Absolute: 0.3 10*3/uL (ref 0.1–1.0)
Monocytes Relative: 7 %
Neutro Abs: 2.4 10*3/uL (ref 1.7–7.7)
Neutrophils Relative %: 46 %
Platelets: 242 10*3/uL (ref 150–400)
RBC: 4.49 MIL/uL (ref 3.87–5.11)
RDW: 14.3 % (ref 11.5–15.5)
WBC: 5.1 10*3/uL (ref 4.0–10.5)

## 2016-11-07 NOTE — ED Provider Notes (Signed)
[facility] DEPT Provider Note   CSN: [id number] Arrival date & time: 11/07/16  1032     History   Chief Complaint Chief Complaint  Patient presents with  . Headache  . left side pain    HPI [name] is a 83 y.o. female.  The history is provided by the patient. No language interpreter was used.  Urinary Frequency  This is a new problem. The problem occurs constantly. The problem has been gradually worsening. Nothing aggravates the symptoms. Nothing relieves the symptoms. She has tried nothing for the symptoms. The treatment provided no relief.  Pt reports her MD diagnosed her with a uti last week.  Pt reports she was given rx for cipro but she is not going to take due to the side effects. Pt complains of pain on left side of her back.  Pt wants to know why she has pain.   Past Medical History:  Diagnosis Date  . Arthritis   . Cancer ([place])    rt lung  . COPD GOLD I 04/18/2013  . Glaucoma   . Hyperlipidemia   . Hypertension   . Lung cancer ([place]) 06/07/2013  . Peripheral vascular disease, unspecified    with Claudication  . S/P radiation therapy 12/12, 12/15, and 07/19/15   The LLL nodule was treated to 54 Gy in 3 fractions of 18 Gy  . Shortness of breath   . Stroke [facility])     Patient Active Problem List   Diagnosis Date Noted  . Cough 02/15/2016  . Sepsis ([place]) 10/26/2015  . Vertigo 08/03/2015  . Full code status 05/10/2015  . Numbness-Left Heel and Toes  10/28/2014  . Sharp pain- Left Lateral Thigh and Right Stump 10/28/2014  . Cancer of lower lobe of left lung ([place]) 06/07/2013  . COPD GOLD I 04/18/2013  . Hemoptysis 04/10/2013  . Pain in joint, lower leg 03/19/2013  . Iliac artery stenosis, left ([place]) 03/13/2012  . Peripheral vascular disease, unspecified 02/28/2012  . Chest pain 10/04/2011  . Hypertension 10/04/2011  . Hyperlipidemia 10/04/2011    Past Surgical History:  Procedure Laterality Date  . ABOVE KNEE LEG AMPUTATION     Right  .  ANGIOPLASTY / STENTING ILIAC  03/22/09   Aortogram-  left common iliac artery  . COLONOSCOPY  Feb. 2013  . HEMORRHOID SURGERY    . TONSILLECTOMY    . VIDEO ASSISTED THORACOSCOPY (VATS)/WEDGE RESECTION Right 06/16/2013   Procedure: VIDEO ASSISTED THORACOSCOPY (VATS)/WEDGE RESECTION;  Surgeon: [name], MD;  Location: [facility];  Service: Thoracic;  Laterality: Right;  Marland Kitchen VIDEO BRONCHOSCOPY Bilateral 04/10/2013   Procedure: VIDEO BRONCHOSCOPY WITH FLUORO;  Surgeon: Tanda Rockers, MD;  Location: [facility];  Service: Cardiopulmonary;  Laterality: Bilateral;  . VIDEO BRONCHOSCOPY N/A 06/16/2013   Procedure: VIDEO BRONCHOSCOPY;  Surgeon: [name], MD;  Location: [facility] OR;  Service: Thoracic;  Laterality: N/A;    OB History    No data available       Home Medications    Prior to Admission medications   Medication Sig Start Date End Date Taking? Authorizing Provider  albuterol (PROAIR HFA) 108 (90 BASE) MCG/ACT inhaler Inhale 2 puffs into the lungs every 6 (six) hours as needed for wheezing (wheezing).    Yes Historical Provider, MD  amLODipine (NORVASC) 2.5 MG tablet Take 2.5 mg by mouth daily.  06/23/15  Yes Historical Provider, MD  Cholecalciferol (VITAMIN D3) 1000 units CAPS Take 1 capsule by mouth daily.   Yes  Historical Provider, MD  clopidogrel (PLAVIX) 75 MG tablet Take 75 mg by mouth every Monday, Wednesday, and Friday.    Yes Historical Provider, MD  losartan (COZAAR) 25 MG tablet Take 25 mg by mouth daily.  10/12/16  Yes Historical Provider, MD  meclizine (ANTIVERT) 25 MG tablet Take 25 mg by mouth 3 (three) times daily as needed for dizziness (dizziness). For dizziness   Yes Historical Provider, MD  omeprazole (PRILOSEC) 20 MG capsule Take 20 mg by mouth daily before breakfast.    Yes Historical Provider, MD  pravastatin (PRAVACHOL) 20 MG tablet Take 20 mg by mouth at bedtime.    Yes Historical Provider, MD  RESTASIS 0.05 % ophthalmic emulsion Place 1 drop into both  eyes 2 (two) times daily.  03/30/16  Yes Historical Provider, MD  traMADol (ULTRAM) 50 MG tablet Take 50 mg by mouth every 8 (eight) hours as needed for moderate pain or severe pain (pain).    Yes Historical Provider, MD  TRAVATAN Z 0.004 % SOLN ophthalmic solution Place 1 drop into both eyes at bedtime.  03/29/16  Yes Historical Provider, MD  acetaminophen (TYLENOL) 500 MG tablet Take 1,000 mg by mouth every 6 (six) hours as needed for mild pain, moderate pain or headache.     Historical Provider, MD  ciprofloxacin (CIPRO) 250 MG tablet Take 250 mg by mouth 2 (two) times daily.  11/02/16   Historical Provider, MD  predniSONE (DELTASONE) 10 MG tablet Take 4 tabs daily x 2 days, 3 tabs daily x 2 days, 2 tabs daily x 2 days, 1 tab daily x 2 days then stop Patient not taking: Reported on 05/05/2016 02/16/16   Collene Gobble, MD  PREPARATION H 0.25-88.44 % suppository Place 1 suppository rectally as needed for hemorrhoids.  01/04/16   Historical Provider, MD    Family History Family History  Problem Relation Age of Onset  . Stomach cancer Mother   . Stroke Brother   . Asthma Sister   . Colon cancer Brother     Social History Social History  Substance Use Topics  . Smoking status: Former Smoker    Packs/day: 0.50    Years: 60.00    Types: Cigarettes    Quit date: 07/31/2009  . Smokeless tobacco: Never Used  . Alcohol use No     Allergies   Aspirin and Morphine and related   Review of Systems Review of Systems  Genitourinary: Positive for frequency.  All other systems reviewed and are negative.    Physical Exam Updated Vital Signs BP (!) 181/77 (BP Location: Right Arm)   Pulse (!) 51   Temp 98.2 F (36.8 C)   Resp 14   SpO2 93%   Physical Exam  Constitutional: She is oriented to person, place, and time. She appears well-developed and well-nourished.  HENT:  Head: Normocephalic.  Right Ear: External ear normal.  Left Ear: External ear normal.  Nose: Nose normal.    Mouth/Throat: Oropharynx is clear and moist.  Eyes: EOM are normal.  Neck: Normal range of motion.  Cardiovascular: Normal rate and regular rhythm.   Pulmonary/Chest: Effort normal.  Tender left low back/flank area  Abdominal: Soft. She exhibits no distension.  Musculoskeletal: Normal range of motion.  Neurological: She is alert and oriented to person, place, and time.  Psychiatric: She has a normal mood and affect.  Nursing note and vitals reviewed.    ED Treatments / Results  Labs (all labs ordered are listed, but only abnormal results  are displayed) Labs Reviewed  COMPREHENSIVE METABOLIC PANEL - Abnormal; Notable for the following:       Result Value   Glucose, Bld 101 (*)    ALT 12 (*)    GFR calc non Af Amer 53 (*)    All other components within normal limits  CBC WITH DIFFERENTIAL/PLATELET  URINALYSIS, ROUTINE W REFLEX MICROSCOPIC    EKG  EKG Interpretation None       Radiology Ct Renal Stone Study  Result Date: 11/07/2016 CLINICAL DATA:  Left flank pain for 3 weeks. EXAM: CT ABDOMEN AND PELVIS WITHOUT CONTRAST TECHNIQUE: Multidetector CT imaging of the abdomen and pelvis was performed following the standard protocol without IV contrast. COMPARISON:  CT scan of March 04, 2015. FINDINGS: Lower chest: Visualized right lung bases clear. Interval development of ground-glass opacity seen posteriorly in left lower lobe which may represent focal inflammation. Hepatobiliary: No focal liver abnormality is seen. No gallstones, gallbladder wall thickening, or biliary dilatation. Pancreas: Unremarkable. No pancreatic ductal dilatation or surrounding inflammatory changes. Spleen: Stable splenic calcification is noted. Adrenals/Urinary Tract: Adrenal glands are unremarkable. Stable left renal cyst. Mild nonobstructive right nephrolithiasis is noted. No hydronephrosis or renal obstruction is noted. Urinary bladder is decompressed. Stomach/Bowel: The appendix appears normal.  Diverticulosis is noted throughout the colon without inflammation. There is no evidence of bowel obstruction. Vascular/Lymphatic: Aortic atherosclerosis. No enlarged abdominal or pelvic lymph nodes. Reproductive: Uterus and bilateral adnexa are unremarkable. Other: Small fat containing periumbilical hernia is noted. No abnormal fluid collection is noted. Musculoskeletal: Multilevel degenerative disc disease is noted in the lumbar spine. IMPRESSION: Colonic diverticulosis without inflammation. Nonobstructive right nephrolithiasis. No hydronephrosis or renal obstruction is noted. Aortic atherosclerosis. Small fat containing periumbilical hernia. Interval development of ground-glass opacity seen posteriorly in left lower lobe which may simply represent focal inflammation or atelectasis. Follow-up unenhanced chest CT in 3 months is recommended to determine if this lesion is persistent. If this abnormality persists on followup CT scan, then possible neoplasm cannot be excluded. Electronically Signed   By: Marijo Conception, M.D.   On: 11/07/2016 13:13    Procedures Procedures (including critical care time)  Medications Ordered in ED Medications - No data to display   Initial Impression / Assessment and Plan / ED Course  I have reviewed the triage vital signs and the nursing notes.  Pertinent labs & imaging results that were available during my care of the patient were reviewed by me and considered in my medical decision making (see chart for details).     Pt counseled on results.  Pt advised she does not need to continue cipro.  Pt advised tylenol for back pain.    Final Clinical Impressions(s) / ED Diagnoses   Final diagnoses:  Low back pain without sciatica, unspecified back pain laterality, unspecified chronicity    New Prescriptions Discharge Medication List as of 11/07/2016  3:51 PM    An After Visit Summary was printed and given to the patient.    Hollace Kinnier Jal, PA-C 11/08/16 [place], MD 11/13/16 [phone]

## 2016-11-07 NOTE — Discharge Instructions (Signed)
Follow up with your Physician for recheck.  You do not need to take cipro rx.  Tylenol for pain

## 2016-11-07 NOTE — ED Notes (Signed)
Pt awaiting provider evaluation.

## 2016-11-07 NOTE — ED Notes (Signed)
Sofia, PA at bedside.  

## 2016-11-07 NOTE — ED Notes (Signed)
PT STATES SHE IS NOT GOING TO TAKE CIPRO AND WANTS SOMETHING ELSE THAT DOESN'T HAVE SIDE AFFECTS.

## 2016-11-07 NOTE — ED Triage Notes (Signed)
Patient c/o headache and left side pain for a month. Patient states that she saw her PCP on Friday and prescribed Cipro but patient states that "I aint taking that stuff".  Patient states that she having trouble getting around due to pain in left side.  Patient states, "my water burns a little bit when I go to the bathroom":

## 2016-11-08 NOTE — Telephone Encounter (Signed)
Has been rescheduled to 11-20-2016.

## 2016-11-20 ENCOUNTER — Other Ambulatory Visit (HOSPITAL_BASED_OUTPATIENT_CLINIC_OR_DEPARTMENT_OTHER): Payer: Medicare Other

## 2016-11-20 ENCOUNTER — Encounter: Payer: Self-pay | Admitting: Internal Medicine

## 2016-11-20 ENCOUNTER — Telehealth: Payer: Self-pay | Admitting: Internal Medicine

## 2016-11-20 ENCOUNTER — Ambulatory Visit (HOSPITAL_BASED_OUTPATIENT_CLINIC_OR_DEPARTMENT_OTHER): Payer: Medicare Other | Admitting: Internal Medicine

## 2016-11-20 ENCOUNTER — Other Ambulatory Visit: Payer: Self-pay | Admitting: Medical Oncology

## 2016-11-20 VITALS — BP 174/59 | HR 55 | Temp 98.1°F | Resp 18 | Ht 66.0 in | Wt 147.2 lb

## 2016-11-20 DIAGNOSIS — J449 Chronic obstructive pulmonary disease, unspecified: Secondary | ICD-10-CM | POA: Diagnosis not present

## 2016-11-20 DIAGNOSIS — Z85118 Personal history of other malignant neoplasm of bronchus and lung: Secondary | ICD-10-CM | POA: Diagnosis not present

## 2016-11-20 DIAGNOSIS — I739 Peripheral vascular disease, unspecified: Secondary | ICD-10-CM

## 2016-11-20 DIAGNOSIS — C3432 Malignant neoplasm of lower lobe, left bronchus or lung: Secondary | ICD-10-CM

## 2016-11-20 DIAGNOSIS — I10 Essential (primary) hypertension: Secondary | ICD-10-CM | POA: Diagnosis not present

## 2016-11-20 LAB — CBC WITH DIFFERENTIAL/PLATELET
BASO%: 0.4 % (ref 0.0–2.0)
Basophils Absolute: 0 10*3/uL (ref 0.0–0.1)
EOS%: 0.8 % (ref 0.0–7.0)
Eosinophils Absolute: 0 10*3/uL (ref 0.0–0.5)
HCT: 40.6 % (ref 34.8–46.6)
HGB: 13.2 g/dL (ref 11.6–15.9)
LYMPH%: 36 % (ref 14.0–49.7)
MCH: 29.6 pg (ref 25.1–34.0)
MCHC: 32.5 g/dL (ref 31.5–36.0)
MCV: 91 fL (ref 79.5–101.0)
MONO#: 0.4 10*3/uL (ref 0.1–0.9)
MONO%: 6.6 % (ref 0.0–14.0)
NEUT#: 3 10*3/uL (ref 1.5–6.5)
NEUT%: 56.2 % (ref 38.4–76.8)
Platelets: 216 10*3/uL (ref 145–400)
RBC: 4.46 10*6/uL (ref 3.70–5.45)
RDW: 14.1 % (ref 11.2–14.5)
WBC: 5.3 10*3/uL (ref 3.9–10.3)
lymph#: 1.9 10*3/uL (ref 0.9–3.3)

## 2016-11-20 LAB — COMPREHENSIVE METABOLIC PANEL
ALT: 11 U/L (ref 0–55)
AST: 17 U/L (ref 5–34)
Albumin: 3.7 g/dL (ref 3.5–5.0)
Alkaline Phosphatase: 66 U/L (ref 40–150)
Anion Gap: 13 mEq/L — ABNORMAL HIGH (ref 3–11)
BUN: 12.5 mg/dL (ref 7.0–26.0)
CO2: 23 mEq/L (ref 22–29)
Calcium: 9.9 mg/dL (ref 8.4–10.4)
Chloride: 105 mEq/L (ref 98–109)
Creatinine: 0.9 mg/dL (ref 0.6–1.1)
EGFR: 65 mL/min/{1.73_m2} — ABNORMAL LOW (ref >90)
Glucose: 91 mg/dl (ref 70–140)
Potassium: 4.1 mEq/L (ref 3.5–5.1)
Sodium: 141 mEq/L (ref 136–145)
Total Bilirubin: 0.54 mg/dL (ref 0.20–1.20)
Total Protein: 7.1 g/dL (ref 6.4–8.3)

## 2016-11-20 NOTE — Progress Notes (Signed)
[facility] [phone]   Fax:([phone]) [place], [place] [address]  DIAGNOSIS: Stage IB (T2a, N0, M0) non-small cell lung cancer consistent with moderately differentiated adenocarcinoma diagnosed in September of 2014.   PRIOR THERAPY:  1) Bronchoscopy, right video-assisted thoracoscopy, minithoracotomy, right upper lobectomy with lymph node dissection under the care of Dr. Servando Snare on 06/16/2013. Tumor size was 3.5 CM and the myriad genetics showed high risk for recurrence. 2) status post a stereotactic radiotherapy to left lower lobe pulmonary nodule under the care of Dr. Tammi Klippel completed 07/19/2015.  CURRENT THERAPY: Observation.  INTERVAL HISTORY: [name] 83 y.o. female returns to the clinic today for follow-up visit. The patient is feeling fine today with no specific complaints except for mild cough and shortness of breath with exertion. She also feels cold most of the time secondary to peripheral vascular disease. Her blood pressure is uncontrolled and she seemed new primary care physician Dr. Jeanie Cooks. She denied having any chest pain or hemoptysis. She denied having any recent weight loss or night sweats. She has no nausea, vomiting, diarrhea or constipation. She denied having any headache or visual changes. She had repeat CT scan of the chest performed last month and she is here for evaluation and discussion of her scan results.   MEDICAL HISTORY: Past Medical History:  Diagnosis Date  . Arthritis   . Cancer ([place])    rt lung  . COPD GOLD I 04/18/2013  . Glaucoma   . Hyperlipidemia   . Hypertension   . Lung cancer ([place]) 06/07/2013  . Peripheral vascular disease, unspecified ([place])    with Claudication  . S/P radiation therapy 12/12, 12/15, and 07/19/15   The LLL nodule was treated to 54 Gy in 3 fractions of 18 Gy  . Shortness of breath   . Stroke [facility])     ALLERGIES:  is  allergic to aspirin and morphine and related.  MEDICATIONS:  Current Outpatient Prescriptions  Medication Sig Dispense Refill  . acetaminophen (TYLENOL) 500 MG tablet Take 1,000 mg by mouth every 6 (six) hours as needed for mild pain, moderate pain or headache.     . albuterol (PROAIR HFA) 108 (90 BASE) MCG/ACT inhaler Inhale 2 puffs into the lungs every 6 (six) hours as needed for wheezing (wheezing).     Marland Kitchen amLODipine (NORVASC) 2.5 MG tablet Take 2.5 mg by mouth daily.   3  . Cholecalciferol (VITAMIN D3) 1000 units CAPS Take 1 capsule by mouth daily.    . ciprofloxacin (CIPRO) 250 MG tablet Take 250 mg by mouth 2 (two) times daily.   0  . clopidogrel (PLAVIX) 75 MG tablet Take 75 mg by mouth every Monday, Wednesday, and Friday.     . losartan (COZAAR) 25 MG tablet Take 25 mg by mouth daily.   3  . meclizine (ANTIVERT) 25 MG tablet Take 25 mg by mouth 3 (three) times daily as needed for dizziness (dizziness). For dizziness    . omeprazole (PRILOSEC) 20 MG capsule Take 20 mg by mouth daily before breakfast.     . pravastatin (PRAVACHOL) 20 MG tablet Take 20 mg by mouth at bedtime.     . predniSONE (DELTASONE) 10 MG tablet Take 4 tabs daily x 2 days, 3 tabs daily x 2 days, 2 tabs daily x 2 days, 1 tab daily x 2 days then stop (Patient not taking: Reported on 05/05/2016)  20 tablet 0  . PREPARATION H 0.25-88.44 % suppository Place 1 suppository rectally as needed for hemorrhoids.   3  . RESTASIS 0.05 % ophthalmic emulsion Place 1 drop into both eyes 2 (two) times daily.     . traMADol (ULTRAM) 50 MG tablet Take 50 mg by mouth every 8 (eight) hours as needed for moderate pain or severe pain (pain).     . TRAVATAN Z 0.004 % SOLN ophthalmic solution Place 1 drop into both eyes at bedtime.      No current facility-administered medications for this visit.     SURGICAL HISTORY:  Past Surgical History:  Procedure Laterality Date  . ABOVE KNEE LEG AMPUTATION     Right  . ANGIOPLASTY / STENTING ILIAC   03/22/09   Aortogram-  left common iliac artery  . COLONOSCOPY  Feb. 2013  . HEMORRHOID SURGERY    . TONSILLECTOMY    . VIDEO ASSISTED THORACOSCOPY (VATS)/WEDGE RESECTION Right 06/16/2013   Procedure: VIDEO ASSISTED THORACOSCOPY (VATS)/WEDGE RESECTION;  Surgeon: [name], MD;  Location: [place];  Service: Thoracic;  Laterality: Right;  Marland Kitchen VIDEO BRONCHOSCOPY Bilateral 04/10/2013   Procedure: VIDEO BRONCHOSCOPY WITH FLUORO;  Surgeon: Tanda Rockers, MD;  [facility];  Service: Cardiopulmonary;  Laterality: Bilateral;  . VIDEO BRONCHOSCOPY N/A 06/16/2013   Procedure: VIDEO BRONCHOSCOPY;  Surgeon: [name], MD;  Location: [facility];  Service: Thoracic;  Laterality: N/A;    REVIEW OF SYSTEMS:  Constitutional: positive for fatigue Eyes: negative Ears, nose, mouth, throat, and face: negative Respiratory: positive for cough and dyspnea on exertion Cardiovascular: negative Gastrointestinal: negative Genitourinary:negative Integument/breast: negative Hematologic/lymphatic: negative Musculoskeletal:positive for muscle weakness Neurological: negative Behavioral/Psych: negative Endocrine: negative Allergic/Immunologic: negative   PHYSICAL EXAMINATION: General appearance: alert, cooperative, fatigued and no distress Head: Normocephalic, without obvious abnormality, atraumatic Neck: no adenopathy, no JVD, supple, symmetrical, trachea midline and thyroid not enlarged, symmetric, no tenderness/mass/nodules Lymph nodes: Cervical, supraclavicular, and axillary nodes normal. Resp: clear to auscultation bilaterally Back: symmetric, no curvature. ROM normal. No CVA tenderness. Cardio: regular rate and rhythm, S1, S2 normal, no murmur, click, rub or gallop GI: soft, non-tender; bowel sounds normal; no masses,  no organomegaly Extremities: extremities normal, atraumatic, no cyanosis or edema Neurologic: Alert and oriented X 3, normal strength and tone. Normal symmetric reflexes.  Normal coordination and gait  ECOG PERFORMANCE STATUS: 1 - Symptomatic but completely ambulatory  Blood pressure (!) 174/59, pulse (!) 55, temperature 98.1 F (36.7 C), temperature source Oral, resp. rate 18, height '5\' 6"'$  (1.676 m), weight 147 lb 3.2 oz (66.8 kg), SpO2 100 %.  LABORATORY DATA: Lab Results  Component Value Date   WBC 5.3 11/20/2016   HGB 13.2 11/20/2016   HCT 40.6 11/20/2016   MCV 91.0 11/20/2016   PLT 216 11/20/2016      Chemistry      Component Value Date/Time   NA 141 11/20/2016 0851   K 4.1 11/20/2016 0851   CL 110 11/07/2016 1239   CO2 23 11/20/2016 0851   BUN 12.5 11/20/2016 0851   CREATININE 0.9 11/20/2016 0851      Component Value Date/Time   CALCIUM 9.9 11/20/2016 0851   ALKPHOS 66 11/20/2016 0851   AST 17 11/20/2016 0851   ALT 11 11/20/2016 0851   BILITOT 0.54 11/20/2016 0851       RADIOGRAPHIC STUDIES: Dg Chest 2 View  Result Date: 11/07/2016 CLINICAL DATA:  Headache, worsening left lower lateral chest pain for 3 weeks, no known injury,  chronic shortness of breath, history of lung cancer EXAM: CHEST  2 VIEW COMPARISON:  10/16/2016 and 10/26/2015 FINDINGS: Cardiomediastinal silhouette is stable. Stable postsurgical changes right upper lobe post partial resection. No infiltrate or pulmonary edema. Stable scarring in left lower lobe posteriorly. No superimposed infiltrate or pulmonary edema. Mild degenerative changes mid and lower thoracic spine. IMPRESSION: Stable postsurgical changes right upper lobe post partial resection. No infiltrate or pulmonary edema. Stable scarring in left lower lobe posteriorly. No superimposed infiltrate or pulmonary edema. Electronically Signed   By: Natasha Mead M.D.   On: 11/07/2016 15:41   Ct Renal Stone Study  Result Date: 11/07/2016 CLINICAL DATA:  Left flank pain for 3 weeks. EXAM: CT ABDOMEN AND PELVIS WITHOUT CONTRAST TECHNIQUE: Multidetector CT imaging of the abdomen and pelvis was performed following the  standard protocol without IV contrast. COMPARISON:  CT scan of March 04, 2015. FINDINGS: Lower chest: Visualized right lung bases clear. Interval development of ground-glass opacity seen posteriorly in left lower lobe which may represent focal inflammation. Hepatobiliary: No focal liver abnormality is seen. No gallstones, gallbladder wall thickening, or biliary dilatation. Pancreas: Unremarkable. No pancreatic ductal dilatation or surrounding inflammatory changes. Spleen: Stable splenic calcification is noted. Adrenals/Urinary Tract: Adrenal glands are unremarkable. Stable left renal cyst. Mild nonobstructive right nephrolithiasis is noted. No hydronephrosis or renal obstruction is noted. Urinary bladder is decompressed. Stomach/Bowel: The appendix appears normal. Diverticulosis is noted throughout the colon without inflammation. There is no evidence of bowel obstruction. Vascular/Lymphatic: Aortic atherosclerosis. No enlarged abdominal or pelvic lymph nodes. Reproductive: Uterus and bilateral adnexa are unremarkable. Other: Small fat containing periumbilical hernia is noted. No abnormal fluid collection is noted. Musculoskeletal: Multilevel degenerative disc disease is noted in the lumbar spine. IMPRESSION: Colonic diverticulosis without inflammation. Nonobstructive right nephrolithiasis. No hydronephrosis or renal obstruction is noted. Aortic atherosclerosis. Small fat containing periumbilical hernia. Interval development of ground-glass opacity seen posteriorly in left lower lobe which may simply represent focal inflammation or atelectasis. Follow-up unenhanced chest CT in 3 months is recommended to determine if this lesion is persistent. If this abnormality persists on followup CT scan, then possible neoplasm cannot be excluded. Electronically Signed   By: Lupita Raider, M.D.   On: 11/07/2016 13:13    ASSESSMENT AND PLAN:  This is a very pleasant 83 years old African-American female with history of a stage  IB non-small cell lung cancer, adenocarcinoma status post right upper lobectomy with lymph node dissection and has been observation since November 2014. The patient had repeat CT scan of the chest performed recently. I personally and independently reviewed the scan and discuss the results with the patient today. The scan showed no evidence for disease progression. I recommended for the patient to continue on observation with repeat CT scan of the chest in 6 months. For hypertension I strongly recommended for the patient to take her blood pressure medication as prescribed and to monitor her blood pressure closely at home. She will also report to her primary care physician and if no improvement in her hypertension. For the symptoms this is likely secondary to poor circulation. For COPD, she is currently followed by Dr. Vassie Loll. She will continue her current treatment with Proair and prednisone. She was advised to call immediately if she has any concerning symptoms in the interval. The patient voices understanding of current disease status and treatment options and is in agreement with the current care plan.  All questions were answered. The patient knows to call the clinic with any  problems, questions or concerns. We can certainly see the patient much sooner if necessary.  Disclaimer: This note was dictated with voice recognition software. Similar sounding words can inadvertently be transcribed and may not be corrected upon review.

## 2016-11-20 NOTE — Telephone Encounter (Signed)
Appointments scheduled per 11/21/15 los. Patient was given a copy of the AVS report and appointment schedule, per 11/20/16 los.

## 2016-11-22 ENCOUNTER — Telehealth: Payer: Self-pay

## 2016-11-22 NOTE — Telephone Encounter (Signed)
Pt. called to report having problems in left leg.  c/o pain from left hip to ankle with walking.  Reported she has almost fallen x 2 recently.  Stated there is some pain in left leg at night, but it worsens with walking.  Denied any (L) LE swelling.  Denied any nonhealing sores of (L) LE.  Advised will have Scheduler contact with appt. Information.  Verb. understanding.

## 2016-12-12 ENCOUNTER — Encounter: Payer: Self-pay | Admitting: Family

## 2016-12-15 ENCOUNTER — Ambulatory Visit (HOSPITAL_COMMUNITY)
Admission: RE | Admit: 2016-12-15 | Discharge: 2016-12-15 | Disposition: A | Discharge status: Home / Self Care | Payer: Medicare Other | Source: Ambulatory Visit | Attending: Family | Admitting: Family

## 2016-12-15 ENCOUNTER — Ambulatory Visit (INDEPENDENT_AMBULATORY_CARE_PROVIDER_SITE_OTHER)
Admission: RE | Admit: 2016-12-15 | Discharge: 2016-12-15 | Disposition: A | Discharge status: Home / Self Care | Payer: Medicare Other | Source: Ambulatory Visit | Attending: Family | Admitting: Family

## 2016-12-15 DIAGNOSIS — I739 Peripheral vascular disease, unspecified: Secondary | ICD-10-CM

## 2016-12-15 DIAGNOSIS — I771 Stricture of artery: Secondary | ICD-10-CM | POA: Insufficient documentation

## 2016-12-18 ENCOUNTER — Ambulatory Visit (INDEPENDENT_AMBULATORY_CARE_PROVIDER_SITE_OTHER): Payer: Medicare Other | Admitting: Family

## 2016-12-18 ENCOUNTER — Encounter: Payer: Self-pay | Admitting: Family

## 2016-12-18 VITALS — BP 110/64 | HR 65 | Temp 97.1°F | Resp 18 | Ht 66.0 in | Wt 148.0 lb

## 2016-12-18 DIAGNOSIS — Z87891 Personal history of nicotine dependence: Secondary | ICD-10-CM | POA: Diagnosis not present

## 2016-12-18 DIAGNOSIS — Z95828 Presence of other vascular implants and grafts: Secondary | ICD-10-CM

## 2016-12-18 DIAGNOSIS — Z89611 Acquired absence of right leg above knee: Secondary | ICD-10-CM

## 2016-12-18 DIAGNOSIS — I771 Stricture of artery: Secondary | ICD-10-CM | POA: Diagnosis not present

## 2016-12-18 DIAGNOSIS — R0989 Other specified symptoms and signs involving the circulatory and respiratory systems: Secondary | ICD-10-CM

## 2016-12-18 NOTE — Patient Instructions (Signed)

## 2016-12-18 NOTE — Progress Notes (Signed)
[facility] [place]   CC: Follow up peripheral artery occlusive disease  History of Present Illness [name] is a 83 y.o. female patient of Dr. Scot Dock who has undergone previous left common iliac artery stenting in August of 2010. She has known infrainguinal arterial occlusive disease. She is status post previous right above-the-knee amputation and is ambulatory with her prosthesis.  She returns today after received phone call from pt to report having problems in left leg; c/o pain from left hip to ankle with walking.  Reported she has almost fallen x 2 recently.  Stated there is some pain in left leg at night, but it worsens with walking.  Denied any (L) LE swelling.  Denied any nonhealing sores of (L) LE.    She has multiple complaints: pain in both shoulders, phantom pain in right leg, also left leg hurts all the time, numbness at the lateral aspect left calf has been worsening.  Left flank pain that was evaluated in the ED recently. But states her balance is improving. Her walking seems limited to in her apt.   Her prosthesis is not working properly and is causing her to fall; she has worked with the [employer] with pr reported inadequate improvement. I referred her to [employer] at her last visit, but for unclear reasons pt sttes she needs to get back with [employer].  She denies non healing ulcers on lower extremities.  She had a VAT wedge resection right lung for cancer, November, 2014, states she had radiation treatments; she also reports it was recently found that she has a tumor in her left lung. 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  Pt states she was told that she has lots of arthritis in her back.  She sees a podiatrist every 3-4 months.  Patient states her blood pressure at home [phone], is elevated today. Home Health visits her periodically.  She states she was told that she had 3 TIA's, does not recall any symptoms, this was  possibly in 2010.  She seems to have tingling and numbness in both hands, unable to ascertain whether one is worse than the other.   She lives in an assisted living facility.  Pt Diabetic: No Pt smoker: quit in 2011  Pt meds include:  Statin :Yes Betablocker: No ASA: No, severe allergic reaction Other anticoagulants/antiplatelets:Plavix   Past Medical History:  Diagnosis Date  . Arthritis   . Cancer ([place])    rt lung  . COPD GOLD I 04/18/2013  . Glaucoma   . Hyperlipidemia   . Hypertension   . Lung cancer ([place]) 06/07/2013  . Peripheral vascular disease, unspecified ([place])    with Claudication  . S/P radiation therapy 12/12, 12/15, and 07/19/15   The LLL nodule was treated to 54 Gy in 3 fractions of 18 Gy  . Shortness of breath   . Stroke [facility])     Social History Social History  Substance Use Topics  . Smoking status: Former Smoker    Packs/day: 0.50    Years: 60.00    Types: Cigarettes    Quit date: 07/31/2009  . Smokeless tobacco: Never Used  . Alcohol use No    Family History Family History  Problem Relation Age of Onset  . Stomach cancer Mother   . Stroke Brother   . Asthma Sister   . Colon cancer Brother     Past Surgical History:  Procedure Laterality Date  . ABOVE KNEE LEG AMPUTATION  Right  . ANGIOPLASTY / STENTING ILIAC  03/22/09   Aortogram-  left common iliac artery  . COLONOSCOPY  Feb. 2013  . HEMORRHOID SURGERY    . TONSILLECTOMY    . VIDEO ASSISTED THORACOSCOPY (VATS)/WEDGE RESECTION Right 06/16/2013   Procedure: VIDEO ASSISTED THORACOSCOPY (VATS)/WEDGE RESECTION;  Surgeon: [name], MD;  Location: [place];  Service: Thoracic;  Laterality: Right;  Marland Kitchen VIDEO BRONCHOSCOPY Bilateral 04/10/2013   Procedure: VIDEO BRONCHOSCOPY WITH FLUORO;  Surgeon: Tanda Rockers, MD;  [facility];  Service: Cardiopulmonary;  Laterality: Bilateral;  . VIDEO BRONCHOSCOPY N/A 06/16/2013   Procedure: VIDEO BRONCHOSCOPY;  Surgeon:  [name], MD;  Location: [facility] OR;  Service: Thoracic;  Laterality: N/A;    Allergies  Allergen Reactions  . Aspirin Anaphylaxis  . Morphine And Related Other (See Comments)    confusion    Current Outpatient Prescriptions  Medication Sig Dispense Refill  . acetaminophen (TYLENOL) 500 MG tablet Take 1,000 mg by mouth every 6 (six) hours as needed for mild pain, moderate pain or headache.     . albuterol (PROAIR HFA) 108 (90 BASE) MCG/ACT inhaler Inhale 2 puffs into the lungs every 6 (six) hours as needed for wheezing (wheezing).     Marland Kitchen amLODipine (NORVASC) 2.5 MG tablet Take 2.5 mg by mouth daily.   3  . Cholecalciferol (VITAMIN D3) 1000 units CAPS Take 1 capsule by mouth daily.    . clopidogrel (PLAVIX) 75 MG tablet Take 75 mg by mouth every Monday, Wednesday, and Friday.     . losartan (COZAAR) 25 MG tablet Take 25 mg by mouth daily.   3  . meclizine (ANTIVERT) 25 MG tablet Take 25 mg by mouth 3 (three) times daily as needed for dizziness (dizziness). For dizziness Takes 1/2 every 8 hours prn    . omeprazole (PRILOSEC) 20 MG capsule Take 20 mg by mouth daily before breakfast.     . pravastatin (PRAVACHOL) 20 MG tablet Take 20 mg by mouth at bedtime.     Marland Kitchen PREPARATION H 0.25-88.44 % suppository Place 1 suppository rectally as needed for hemorrhoids.   3  . RESTASIS 0.05 % ophthalmic emulsion Place 1 drop into both eyes 2 (two) times daily.     . traMADol (ULTRAM) 50 MG tablet Take 50 mg by mouth every 8 (eight) hours as needed for moderate pain or severe pain (pain).     . TRAVATAN Z 0.004 % SOLN ophthalmic solution Place 1 drop into both eyes at bedtime.     . ciprofloxacin (CIPRO) 250 MG tablet Take 250 mg by mouth 2 (two) times daily.   0   No current facility-administered medications for this visit.     ROS: See HPI for pertinent positives and negatives.   Physical Examination  Vitals:   12/18/16 0905 12/18/16 0929 12/18/16 0935  BP: (!) 160/70 (!) 170/80 110/64   Pulse: 65    Resp: 18    Temp: 97.1 F (36.2 C)    TempSrc: Oral    SpO2: 99%    Weight: 148 lb (67.1 kg)    Height: '5\' 6"'$  (1.676 m)     Body mass index is 23.89 kg/m.  General: A&O x 3, WDWN, elderly female Gait: with walker  Eyes: PERRLA  Pulmonary: Respirations are non labored, CTAB, without wheezes, rales, or rhonchi  Cardiac: regular rythm, no murmur detected  Carotid Bruits Left Right   Negative  positive  VASCULAR EXAM: Radial pulses:   Extremities: left  leg without ischemic changes, right AKA with prosthesis in place.  No open wounds.   LE Pulses  LEFT  RIGHT   FEMORAL  2+ palpable not palpable   POPLITEAL  not palpable  AKA  POSTERIOR TIBIAL  Not palpable  AKA   DORSALIS PEDIS ANTERIOR TIBIAL  not palpable  AKA    Abdomen: soft, NT, no masses palpated. Aortic pulse is not palpable.  Skin: no rashes, no ulcers. Musculoskeletal: no muscle wasting or atrophy  Neurologic: A&O X 3; Appropriate Affect, MOTOR FUNCTION: moving all extremities equally. Speech is fluent/normal. Hard of hearing. Loquacious.      ASSESSMENT: TREVA HUYETT is a 83 y.o. female who is s/p left common iliac artery stenting in August of 2010. She has known infrainguinal arterial occlusive disease. She is status post previous right above-the-knee amputation and is ambulatory with her prosthesis and walker.  She has no signs of ischemia in her left foot/leg. Pt states her current right AKA prosthetic is causing her to fall, is ill fitting; states she has tried working with the [employer] prosthetic representative and was told that if they try to adjust her prosthetic it will be worse. I referred her to [employer] at her last visit but pt it is unclear why pt wants to return to [facility]; I advised her to call the representative, she has his card.   I discussed with Dr. Trula Slade pt HPI, non invasive vascular studies from today, and exam results;  the multiple joint, low back, and radiculopathy type left leg pain is not c/w arterial occlusive disease. She has known arthritis "all over" as she states, likely in her lumber spine. Will defer to her PCP to evaluate and manage this.  No change in left iliac artery duplex, no significant stenosis, no sign of ischemia in her left foot or leg, 2+ palpable left femoral pulse.   50 mm Hg brachial pressure difference, right arm 110, left arm 160, equal symptoms of tingling in both hands; bilateral radial pulses are 2+ palpable.   DATA (12-15-16):  Left iliac artery stent duplex  Decreased visualization of the abdominal vasculature due to overlying bowel gas.  Evidence suggestive of a patent left CIA stent based on limited visualization with biphasic left iliac waveforms. No significant change in stent velocities in comparison to the last exam on 03-08-16; however, ABI has declined.    ABI:   R:  AKA         L:   ABI: 0.53 (0.66, 03-08-16),   PT: mono  DP: mono  TBI: 0.41 Decline in left ABI from 0.66 to 0.53 in nine months.   03-08-16 Carotid duplex suggests <40% bilateral ICA stenoses. No significant change compared to exam of 11/11/14.     Plan:  Daily seated left leg exercises demonstrated and discussed.  Follow-up in 6 months with left aortoiliac duplex and ABI, 2 years with Carotid Duplex scan, see me afterward for discussion of results and evaluation.  I discussed in depth with the patient the nature of atherosclerosis, and emphasized the importance of maximal medical management including strict control of blood pressure, blood glucose, and lipid levels, obtaining regular exercise, and continued cessation of smoking.  The patient is aware that without maximal medical management the underlying atherosclerotic disease process will progress, limiting the benefit of any interventions.  The patient was given information about PAD including signs, symptoms, treatment, what symptoms  should prompt the patient to seek immediate medical care, and risk reduction measures to  take.  Clemon Chambers, RN, MSN, FNP-C [facility] Phone: [phone]  Clinic MD: Trula Slade  12/18/16 9:37 AM

## 2016-12-20 NOTE — Addendum Note (Signed)
Addended by: Lianne Cure A on: 12/20/2016 02:15 PM   Modules accepted: Orders

## 2016-12-27 ENCOUNTER — Ambulatory Visit: Payer: Medicare Other | Admitting: Podiatry

## 2017-01-08 ENCOUNTER — Other Ambulatory Visit: Payer: Self-pay | Admitting: Cardiology

## 2017-01-08 DIAGNOSIS — R079 Chest pain, unspecified: Secondary | ICD-10-CM

## 2017-01-24 ENCOUNTER — Ambulatory Visit (INDEPENDENT_AMBULATORY_CARE_PROVIDER_SITE_OTHER): Payer: Medicare Other | Admitting: Podiatry

## 2017-01-24 ENCOUNTER — Encounter: Payer: Self-pay | Admitting: Podiatry

## 2017-01-24 DIAGNOSIS — B351 Tinea unguium: Secondary | ICD-10-CM

## 2017-01-24 DIAGNOSIS — M79675 Pain in left toe(s): Secondary | ICD-10-CM | POA: Diagnosis not present

## 2017-01-24 DIAGNOSIS — L84 Corns and callosities: Secondary | ICD-10-CM | POA: Diagnosis not present

## 2017-01-24 DIAGNOSIS — I739 Peripheral vascular disease, unspecified: Secondary | ICD-10-CM

## 2017-01-24 NOTE — Progress Notes (Signed)
Patient ID: [name], female   DOB: May 02, 1928, 83 y.o.   MRN: [id number]    Subjective: This patient presents today at her request complaining of painful toenails on her left foot request nail debridement.  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

## 2017-01-26 ENCOUNTER — Other Ambulatory Visit: Payer: Self-pay | Admitting: Cardiology

## 2017-01-26 ENCOUNTER — Encounter (HOSPITAL_COMMUNITY)
Admission: RE | Admit: 2017-01-26 | Discharge: 2017-01-26 | Disposition: A | Discharge status: Home / Self Care | Payer: Medicare Other | Source: Ambulatory Visit | Attending: Cardiology | Admitting: Cardiology

## 2017-01-26 DIAGNOSIS — R079 Chest pain, unspecified: Secondary | ICD-10-CM

## 2017-01-26 MED ORDER — REGADENOSON 0.4 MG/5ML IV SOLN
INTRAVENOUS | Status: AC
  Administered 2017-01-26: 0.4 mg via INTRAVENOUS
  Filled 2017-01-26: qty 5

## 2017-01-26 MED ORDER — TECHNETIUM TC 99M TETROFOSMIN IV KIT
30.0000 | PACK | Freq: Once | INTRAVENOUS | Status: AC | PRN
  Administered 2017-01-26: 30 via INTRAVENOUS

## 2017-01-26 MED ORDER — REGADENOSON 0.4 MG/5ML IV SOLN
0.4000 mg | Freq: Once | INTRAVENOUS | Status: AC
  Administered 2017-01-26: 0.4 mg via INTRAVENOUS

## 2017-01-26 MED ORDER — TECHNETIUM TC 99M TETROFOSMIN IV KIT
10.0000 | PACK | Freq: Once | INTRAVENOUS | Status: AC | PRN
  Administered 2017-01-26: 10 via INTRAVENOUS

## 2017-03-05 ENCOUNTER — Telehealth: Payer: Self-pay

## 2017-03-05 NOTE — Telephone Encounter (Signed)
Pt called to verify appts. They are Oct 22. Requested of pt she call us about oct 1st to request prior authorization for her CT due in October.

## 2017-03-07 ENCOUNTER — Ambulatory Visit: Payer: Medicare Other | Admitting: Family

## 2017-03-07 ENCOUNTER — Encounter (HOSPITAL_COMMUNITY): Payer: Medicare Other

## 2017-03-16 ENCOUNTER — Emergency Department (HOSPITAL_COMMUNITY): Payer: Medicare Other

## 2017-03-16 ENCOUNTER — Emergency Department (HOSPITAL_COMMUNITY)
Admission: EM | Admit: 2017-03-16 | Discharge: 2017-03-16 | Disposition: A | Discharge status: Home / Self Care | Payer: Medicare Other | Attending: Emergency Medicine | Admitting: Emergency Medicine

## 2017-03-16 ENCOUNTER — Encounter (HOSPITAL_COMMUNITY): Payer: Self-pay | Admitting: Emergency Medicine

## 2017-03-16 DIAGNOSIS — Z7902 Long term (current) use of antithrombotics/antiplatelets: Secondary | ICD-10-CM | POA: Diagnosis not present

## 2017-03-16 DIAGNOSIS — J441 Chronic obstructive pulmonary disease with (acute) exacerbation: Secondary | ICD-10-CM

## 2017-03-16 DIAGNOSIS — Z79899 Other long term (current) drug therapy: Secondary | ICD-10-CM | POA: Diagnosis not present

## 2017-03-16 DIAGNOSIS — Z8673 Personal history of transient ischemic attack (TIA), and cerebral infarction without residual deficits: Secondary | ICD-10-CM | POA: Diagnosis not present

## 2017-03-16 DIAGNOSIS — R0602 Shortness of breath: Secondary | ICD-10-CM | POA: Diagnosis present

## 2017-03-16 DIAGNOSIS — Z87891 Personal history of nicotine dependence: Secondary | ICD-10-CM | POA: Diagnosis not present

## 2017-03-16 DIAGNOSIS — R0789 Other chest pain: Secondary | ICD-10-CM | POA: Insufficient documentation

## 2017-03-16 DIAGNOSIS — I10 Essential (primary) hypertension: Secondary | ICD-10-CM | POA: Insufficient documentation

## 2017-03-16 LAB — BASIC METABOLIC PANEL
Anion gap: 8 (ref 5–15)
BUN: 13 mg/dL (ref 6–20)
CO2: 23 mmol/L (ref 22–32)
Calcium: 9.1 mg/dL (ref 8.9–10.3)
Chloride: 107 mmol/L (ref 101–111)
Creatinine, Ser: 1.13 mg/dL — ABNORMAL HIGH (ref 0.44–1.00)
GFR calc Af Amer: 49 mL/min — ABNORMAL LOW (ref >60)
GFR calc non Af Amer: 42 mL/min — ABNORMAL LOW (ref >60)
Glucose, Bld: 98 mg/dL (ref 65–99)
Potassium: 5.1 mmol/L (ref 3.5–5.1)
Sodium: 138 mmol/L (ref 135–145)

## 2017-03-16 LAB — CBC
HCT: 40.8 % (ref 36.0–46.0)
Hemoglobin: 13.4 g/dL (ref 12.0–15.0)
MCH: 29.6 pg (ref 26.0–34.0)
MCHC: 32.8 g/dL (ref 30.0–36.0)
MCV: 90.1 fL (ref 78.0–100.0)
Platelets: 242 10*3/uL (ref 150–400)
RBC: 4.53 MIL/uL (ref 3.87–5.11)
RDW: 14.6 % (ref 11.5–15.5)
WBC: 5.3 10*3/uL (ref 4.0–10.5)

## 2017-03-16 LAB — I-STAT TROPONIN, ED
Troponin i, poc: 0.01 ng/mL (ref 0.00–0.08)
Troponin i, poc: 0 ng/mL (ref 0.00–0.08)

## 2017-03-16 LAB — BRAIN NATRIURETIC PEPTIDE: B Natriuretic Peptide: 277 pg/mL — ABNORMAL HIGH (ref 0.0–100.0)

## 2017-03-16 MED ORDER — IPRATROPIUM-ALBUTEROL 0.5-2.5 (3) MG/3ML IN SOLN
3.0000 mL | Freq: Once | RESPIRATORY_TRACT | Status: AC
  Administered 2017-03-16: 3 mL via RESPIRATORY_TRACT
  Filled 2017-03-16: qty 3

## 2017-03-16 NOTE — ED Provider Notes (Signed)
[place] DEPT Provider Note   CSN: [id number] Arrival date & time: 03/16/17  0900     History   Chief Complaint Chief Complaint  Patient presents with  . Shortness of Breath  . Chest Pain    HPI [name] is a 83 y.o. female.  The history is provided by the patient.  Shortness of Breath  This is a chronic problem. Duration: over 1 yr. The problem occurs continuously.Progression since onset: worsened over the past week. Associated symptoms include cough (dry), orthopnea and chest pain. Pertinent negatives include no fever, no rhinorrhea, no sputum production, no wheezing, no vomiting, no abdominal pain, no leg pain and no leg swelling. Associated medical issues include COPD and heart failure.  Chest Pain   This is a recurrent problem. Episode onset: 1 week. The problem occurs daily. The problem has not changed since onset.Associated with: certqain positions. The pain is present in the lateral region (left and right). The quality of the pain is described as sharp and stabbing. The pain does not radiate. The symptoms are aggravated by certain positions. Associated symptoms include cough (dry), orthopnea and shortness of breath. Pertinent negatives include no abdominal pain, no fever, no leg pain, no sputum production and no vomiting.    Past Medical History:  Diagnosis Date  . Arthritis   . Cancer ([place])    rt lung  . COPD GOLD I 04/18/2013  . Glaucoma   . Hyperlipidemia   . Hypertension   . Lung cancer ([place]) 06/07/2013  . Peripheral vascular disease, unspecified ([place])    with Claudication  . S/P radiation therapy 12/12, 12/15, and 07/19/15   The LLL nodule was treated to 54 Gy in 3 fractions of 18 Gy  . Shortness of breath   . Stroke [facility])     Patient Active Problem List   Diagnosis Date Noted  . Cough 02/15/2016  . Sepsis ([place]) 10/26/2015  . Vertigo 08/03/2015  . Full code status 05/10/2015  . Numbness-Left Heel and Toes  10/28/2014  . Sharp pain- Left Lateral  Thigh and Right Stump 10/28/2014  . Cancer of lower lobe of left lung ([place]) 06/07/2013  . COPD GOLD I 04/18/2013  . Hemoptysis 04/10/2013  . Pain in joint, lower leg 03/19/2013  . Iliac artery stenosis, left ([place]) 03/13/2012  . Peripheral vascular disease, unspecified ([place]) 02/28/2012  . Chest pain 10/04/2011  . Hypertension 10/04/2011  . Hyperlipidemia 10/04/2011    Past Surgical History:  Procedure Laterality Date  . ABOVE KNEE LEG AMPUTATION     Right  . ANGIOPLASTY / STENTING ILIAC  03/22/09   Aortogram-  left common iliac artery  . COLONOSCOPY  Feb. 2013  . HEMORRHOID SURGERY    . TONSILLECTOMY    . VIDEO ASSISTED THORACOSCOPY (VATS)/WEDGE RESECTION Right 06/16/2013   Procedure: VIDEO ASSISTED THORACOSCOPY (VATS)/WEDGE RESECTION;  Surgeon: [name], MD;  Location: [facility];  Service: Thoracic;  Laterality: Right;  Marland Kitchen VIDEO BRONCHOSCOPY Bilateral 04/10/2013   Procedure: VIDEO BRONCHOSCOPY WITH FLUORO;  Surgeon: Tanda Rockers, MD;  [facility];  Service: Cardiopulmonary;  Laterality: Bilateral;  . VIDEO BRONCHOSCOPY N/A 06/16/2013   Procedure: VIDEO BRONCHOSCOPY;  Surgeon: [name], MD;  Location: [facility] OR;  Service: Thoracic;  Laterality: N/A;    OB History    No data available       Home Medications    Prior to Admission medications   Medication Sig Start Date End Date Taking? Authorizing Provider  acetaminophen (TYLENOL)  500 MG tablet Take 1,000 mg by mouth every 6 (six) hours as needed for mild pain, moderate pain or headache.    Yes [provider]  albuterol (PROAIR HFA) 108 (90 BASE) MCG/ACT inhaler Inhale 2 puffs into the lungs every 6 (six) hours as needed for wheezing (wheezing).    Yes [provider]  amLODipine (NORVASC) 2.5 MG tablet Take 2.5 mg by mouth daily.  06/23/15  Yes [provider]  Cholecalciferol (VITAMIN D3) 1000 units CAPS Take 1 capsule by mouth daily.   Yes [provider]    clopidogrel (PLAVIX) 75 MG tablet Take 75 mg by mouth every Monday, Wednesday, and Friday.    Yes [provider]  docusate sodium (COLACE) 50 MG capsule Take 100 mg by mouth as needed for mild constipation.   Yes [provider]  losartan (COZAAR) 25 MG tablet Take 25 mg by mouth daily.  10/12/16  Yes [provider]  meclizine (ANTIVERT) 25 MG tablet Take 12.5 mg by mouth 3 (three) times daily as needed for dizziness (dizziness). For dizziness Takes 1/2 every 8 hours prn   Yes [provider]  omeprazole (PRILOSEC) 20 MG capsule Take 20 mg by mouth daily before breakfast.    Yes [provider]  pravastatin (PRAVACHOL) 20 MG tablet Take 20 mg by mouth at bedtime.    Yes [provider]  PREPARATION H 0.25-88.44 % suppository Place 1 suppository rectally as needed (constipation).  01/04/16  Yes [provider]  RESTASIS 0.05 % ophthalmic emulsion Place 2 drops into both eyes 2 (two) times daily.  03/30/16  Yes [provider]  traMADol (ULTRAM) 50 MG tablet Take 50 mg by mouth every 8 (eight) hours as needed for moderate pain or severe pain (pain).    Yes [provider]  TRAVATAN Z 0.004 % SOLN ophthalmic solution Place 1 drop into both eyes at bedtime.  03/29/16  Yes [provider]    Family History Family History  Problem Relation Age of Onset  . Stomach cancer Mother   . Stroke Brother   . Asthma Sister   . Colon cancer Brother     Social History Social History  Substance Use Topics  . Smoking status: Former Smoker    Packs/day: 0.50    Years: 60.00    Types: Cigarettes    Quit date: 07/31/2009  . Smokeless tobacco: Never Used  . Alcohol use No     Allergies   Aspirin and Morphine and related   Review of Systems Review of Systems  Constitutional: Negative for fever.  HENT: Negative for rhinorrhea.   Respiratory: Positive for cough (dry) and shortness of breath. Negative for sputum  production and wheezing.   Cardiovascular: Positive for chest pain and orthopnea. Negative for leg swelling.  Gastrointestinal: Negative for abdominal pain and vomiting.   All other systems are reviewed and are negative for acute change except as noted in the HPI   Physical Exam Updated Vital Signs BP (!) 149/63   Pulse 60   Temp 97.8 F (36.6 C)   Resp 16   SpO2 100%   Physical Exam  Constitutional: She is oriented to person, place, and time. She appears well-developed and well-nourished. No distress.  HENT:  Head: Normocephalic and atraumatic.  Nose: Nose normal.  Eyes: Pupils are equal, round, and reactive to light. Conjunctivae and EOM are normal. Right eye exhibits no discharge. Left eye exhibits no discharge. No scleral icterus.  Neck: Normal  range of motion. Neck supple.  Cardiovascular: Normal rate and regular rhythm.  Exam reveals no gallop and no friction rub.   No murmur heard. Pulmonary/Chest: Effort normal and breath sounds normal. No stridor. No respiratory distress. She has no rales.     She exhibits tenderness.    Poor airflow throughout  Abdominal: Soft. She exhibits no distension. There is no tenderness.  Musculoskeletal: She exhibits no edema or tenderness.  Right AKA with prosthetic  Neurological: She is alert and oriented to person, place, and time.  Skin: Skin is warm and dry. No rash noted. She is not diaphoretic. No erythema.  Psychiatric: She has a normal mood and affect.  Vitals reviewed.    ED Treatments / Results  Labs (all labs ordered are listed, but only abnormal results are displayed) Labs Reviewed  BASIC METABOLIC PANEL - Abnormal; Notable for the following:       Result Value   Creatinine, Ser 1.13 (*)    GFR calc non Af Amer 42 (*)    GFR calc Af Amer 49 (*)    All other components within normal limits  BRAIN NATRIURETIC PEPTIDE - Abnormal; Notable for the following:    B Natriuretic Peptide 277.0 (*)    All other components  within normal limits  CBC  I-STAT TROPONIN, ED  I-STAT TROPONIN, ED    EKG  EKG Interpretation  Date/Time:  Friday March 16 2017 09:09:39 EDT Ventricular Rate:  54 PR Interval:  138 QRS Duration: 106 QT Interval:  470 QTC Calculation: 445 R Axis:   55 Text Interpretation:  Sinus bradycardia Incomplete left bundle branch block Borderline ECG No significant change since last tracing Confirmed by Addison Lank [phone]) on 03/16/2017 9:22:19 AM Also confirmed by Addison Lank [phone]), editor Drema Pry [phone])  on 03/16/2017 9:49:34 AM       Radiology Dg Chest 2 View  Result Date: 03/16/2017 CLINICAL DATA:  Chest pain EXAM: CHEST  2 VIEW COMPARISON:  11/07/2016 FINDINGS: Linear subsegmental atelectasis or scarring in the lung bases. Heart is normal size. No effusions. No additional focal airspace opacities. No acute bony abnormality. Old healed right third rib fracture. IMPRESSION: Bibasilar atelectasis or scarring. Electronically Signed   By: Rolm Baptise M.D.   On: 03/16/2017 09:29    Procedures Procedures (including critical care time)  Medications Ordered in ED Medications  ipratropium-albuterol (DUONEB) 0.5-2.5 (3) MG/3ML nebulizer solution 3 mL (3 mLs Nebulization Given 03/16/17 1223)     Initial Impression / Assessment and Plan / ED Course  I have reviewed the triage vital signs and the nursing notes.  Pertinent labs & imaging results that were available during my care of the patient were reviewed by me and considered in my medical decision making (see chart for details).     1. Dyspnea No evidence of volume overload. It is most consistent with COPD exacerbation. Patient was given 1 DuoNeb which resulted in significant improvement/near-complete resolution of her dyspnea. Chest x-ray without evidence of pneumonia, pulmonary edema, pleural effusions.  2. Chest pain Most consistent with chest wall pain. EKG without acute ischemic changes or evidence of  pericarditis. Troponin negative. Given the low suspicion for ACS and the duration of her symptomatology I feel this is sufficient to rule out ACS. I have low suspicion for pulmonary embolism. Presentation is not classic for aortic dissection or esophageal perforation. Chest x-ray without evidence suggestive of pneumonia, pneumothorax, pneumomediastinum.  No abnormal contour of the mediastinum to suggest dissection. No evidence of  acute injuries.   The patient is safe for discharge with strict return precautions.    Final Clinical Impressions(s) / ED Diagnoses   Final diagnoses:  COPD exacerbation ([place])  Chest wall pain   Disposition: Discharge  Condition: Good  I have discussed the results, Dx and Tx plan with the patient who expressed understanding and agree(s) with the plan. Discharge instructions discussed at great length. The patient was given strict return precautions who verbalized understanding of the instructions. No further questions at time of discharge.    Discharge Medication List as of 03/16/2017  1:38 PM      Follow Up: [name], MD [place] [address] [phone]  Schedule an appointment as soon as possible for a visit  As needed      Kelbi Renstrom, Grayce Sessions, MD 03/16/17 [phone]

## 2017-03-16 NOTE — ED Triage Notes (Signed)
Pt c/o problems with her breathing, states "ive had this problem for two years, and my arm hurts, and I have a little chest pain, they said I had a breathing problem but I dont remember what it was." pt resp e/u.

## 2017-04-11 ENCOUNTER — Other Ambulatory Visit: Payer: Self-pay | Admitting: Internal Medicine

## 2017-04-11 DIAGNOSIS — Z1231 Encounter for screening mammogram for malignant neoplasm of breast: Secondary | ICD-10-CM

## 2017-04-17 ENCOUNTER — Ambulatory Visit: Payer: Self-pay | Admitting: Podiatry

## 2017-04-24 ENCOUNTER — Ambulatory Visit: Payer: Self-pay | Admitting: Podiatry

## 2017-04-25 ENCOUNTER — Ambulatory Visit: Payer: Medicare Other | Admitting: Podiatry

## 2017-04-26 ENCOUNTER — Ambulatory Visit
Admission: RE | Admit: 2017-04-26 | Discharge: 2017-04-26 | Disposition: A | Discharge status: Home / Self Care | Payer: Medicare Other | Source: Ambulatory Visit | Attending: Internal Medicine | Admitting: Internal Medicine

## 2017-04-26 DIAGNOSIS — Z1231 Encounter for screening mammogram for malignant neoplasm of breast: Secondary | ICD-10-CM

## 2017-05-01 ENCOUNTER — Ambulatory Visit: Payer: Self-pay | Admitting: Podiatry

## 2017-05-04 ENCOUNTER — Emergency Department (HOSPITAL_COMMUNITY)
Admission: EM | Admit: 2017-05-04 | Discharge: 2017-05-04 | Disposition: A | Discharge status: Home / Self Care | Payer: Medicare Other | Attending: Emergency Medicine | Admitting: Emergency Medicine

## 2017-05-04 ENCOUNTER — Emergency Department (HOSPITAL_COMMUNITY): Payer: Medicare Other

## 2017-05-04 ENCOUNTER — Encounter (HOSPITAL_COMMUNITY): Payer: Self-pay

## 2017-05-04 DIAGNOSIS — R05 Cough: Secondary | ICD-10-CM | POA: Diagnosis present

## 2017-05-04 DIAGNOSIS — Z79899 Other long term (current) drug therapy: Secondary | ICD-10-CM | POA: Insufficient documentation

## 2017-05-04 DIAGNOSIS — M791 Myalgia, unspecified site: Secondary | ICD-10-CM | POA: Diagnosis not present

## 2017-05-04 DIAGNOSIS — M25512 Pain in left shoulder: Secondary | ICD-10-CM | POA: Diagnosis not present

## 2017-05-04 DIAGNOSIS — Z85118 Personal history of other malignant neoplasm of bronchus and lung: Secondary | ICD-10-CM | POA: Insufficient documentation

## 2017-05-04 DIAGNOSIS — J441 Chronic obstructive pulmonary disease with (acute) exacerbation: Secondary | ICD-10-CM | POA: Diagnosis not present

## 2017-05-04 DIAGNOSIS — Z8673 Personal history of transient ischemic attack (TIA), and cerebral infarction without residual deficits: Secondary | ICD-10-CM | POA: Insufficient documentation

## 2017-05-04 DIAGNOSIS — B9789 Other viral agents as the cause of diseases classified elsewhere: Secondary | ICD-10-CM | POA: Diagnosis not present

## 2017-05-04 DIAGNOSIS — Z95820 Peripheral vascular angioplasty status with implants and grafts: Secondary | ICD-10-CM | POA: Diagnosis not present

## 2017-05-04 DIAGNOSIS — Z7902 Long term (current) use of antithrombotics/antiplatelets: Secondary | ICD-10-CM | POA: Insufficient documentation

## 2017-05-04 DIAGNOSIS — I10 Essential (primary) hypertension: Secondary | ICD-10-CM | POA: Insufficient documentation

## 2017-05-04 DIAGNOSIS — J069 Acute upper respiratory infection, unspecified: Secondary | ICD-10-CM | POA: Diagnosis not present

## 2017-05-04 DIAGNOSIS — Z87891 Personal history of nicotine dependence: Secondary | ICD-10-CM | POA: Diagnosis not present

## 2017-05-04 LAB — BASIC METABOLIC PANEL
Anion gap: 10 (ref 5–15)
BUN: 13 mg/dL (ref 6–20)
CO2: 20 mmol/L — ABNORMAL LOW (ref 22–32)
Calcium: 8.8 mg/dL — ABNORMAL LOW (ref 8.9–10.3)
Chloride: 108 mmol/L (ref 101–111)
Creatinine, Ser: 1.13 mg/dL — ABNORMAL HIGH (ref 0.44–1.00)
GFR calc Af Amer: 49 mL/min — ABNORMAL LOW (ref >60)
GFR calc non Af Amer: 42 mL/min — ABNORMAL LOW (ref >60)
Glucose, Bld: 105 mg/dL — ABNORMAL HIGH (ref 65–99)
Potassium: 3.8 mmol/L (ref 3.5–5.1)
Sodium: 138 mmol/L (ref 135–145)

## 2017-05-04 LAB — CBC
HCT: 42.1 % (ref 36.0–46.0)
Hemoglobin: 13.6 g/dL (ref 12.0–15.0)
MCH: 29.5 pg (ref 26.0–34.0)
MCHC: 32.3 g/dL (ref 30.0–36.0)
MCV: 91.3 fL (ref 78.0–100.0)
Platelets: 225 10*3/uL (ref 150–400)
RBC: 4.61 MIL/uL (ref 3.87–5.11)
RDW: 14.1 % (ref 11.5–15.5)
WBC: 7.3 10*3/uL (ref 4.0–10.5)

## 2017-05-04 LAB — I-STAT TROPONIN, ED: Troponin i, poc: 0 ng/mL (ref 0.00–0.08)

## 2017-05-04 MED ORDER — ALBUTEROL SULFATE (2.5 MG/3ML) 0.083% IN NEBU
5.0000 mg | INHALATION_SOLUTION | Freq: Once | RESPIRATORY_TRACT | Status: AC
  Administered 2017-05-04: 5 mg via RESPIRATORY_TRACT

## 2017-05-04 MED ORDER — ALBUTEROL SULFATE (2.5 MG/3ML) 0.083% IN NEBU
INHALATION_SOLUTION | RESPIRATORY_TRACT | Status: AC
  Filled 2017-05-04: qty 6

## 2017-05-04 NOTE — Discharge Instructions (Signed)
Take Tylenol for fever or pain.  Use Robitussin if needed, for cough.

## 2017-05-04 NOTE — ED Notes (Signed)
Unable to get pts blood work.

## 2017-05-04 NOTE — ED Triage Notes (Signed)
PerPt, Pt is coming from home with complaints of flu-like symptoms that started on Friday. Pt reports that she had a flu-shot on Thursday and then Friday started to feel feverish all over along with productive cough. Pt also complains of left shoulder soreness and left leg spasms that have been taking place. Pt has wheezing noted upon assessment.

## 2017-05-04 NOTE — ED Provider Notes (Signed)
[facility] DEPT Provider Note   CSN: [id number] Arrival date & time: 05/04/17  0904     History   Chief Complaint Chief Complaint  Patient presents with  . Cough    HPI [name] is a 83 y.o. female.   The patient presents for evaluation of cough, achiness, and left shoulder pain, for several days.  She is concerned that she has "the flu," since she had a flu vaccine, 1 week ago.  She has felt hot but not taken her temperature.  She denies chest pain, weakness or dizziness.  No other recent illnesses.  There are no other known modifying factors.  HPI  Past Medical History:  Diagnosis Date  . Arthritis   . Cancer ([place])    rt lung  . COPD GOLD I 04/18/2013  . Glaucoma   . Hyperlipidemia   . Hypertension   . Lung cancer ([place]) 06/07/2013  . Peripheral vascular disease, unspecified ([place])    with Claudication  . S/P radiation therapy 12/12, 12/15, and 07/19/15   The LLL nodule was treated to 54 Gy in 3 fractions of 18 Gy  . Shortness of breath   . Stroke [facility])     Patient Active Problem List   Diagnosis Date Noted  . Cough 02/15/2016  . Sepsis ([place]) 10/26/2015  . Vertigo 08/03/2015  . Full code status 05/10/2015  . Numbness-Left Heel and Toes  10/28/2014  . Sharp pain- Left Lateral Thigh and Right Stump 10/28/2014  . Cancer of lower lobe of left lung ([place]) 06/07/2013  . COPD GOLD I 04/18/2013  . Hemoptysis 04/10/2013  . Pain in joint, lower leg 03/19/2013  . Iliac artery stenosis, left ([place]) 03/13/2012  . Peripheral vascular disease, unspecified ([place]) 02/28/2012  . Chest pain 10/04/2011  . Hypertension 10/04/2011  . Hyperlipidemia 10/04/2011    Past Surgical History:  Procedure Laterality Date  . ABOVE KNEE LEG AMPUTATION     Right  . ANGIOPLASTY / STENTING ILIAC  03/22/09   Aortogram-  left common iliac artery  . COLONOSCOPY  Feb. 2013  . HEMORRHOID SURGERY    . TONSILLECTOMY    . VIDEO ASSISTED THORACOSCOPY (VATS)/WEDGE RESECTION Right 06/16/2013     Procedure: VIDEO ASSISTED THORACOSCOPY (VATS)/WEDGE RESECTION;  Surgeon: [name], MD;  Location: [place];  Service: Thoracic;  Laterality: Right;  Marland Kitchen VIDEO BRONCHOSCOPY Bilateral 04/10/2013   Procedure: VIDEO BRONCHOSCOPY WITH FLUORO;  Surgeon: Tanda Rockers, MD;  [facility];  Service: Cardiopulmonary;  Laterality: Bilateral;  . VIDEO BRONCHOSCOPY N/A 06/16/2013   Procedure: VIDEO BRONCHOSCOPY;  Surgeon: [name], MD;  Location: [facility] OR;  Service: Thoracic;  Laterality: N/A;    OB History    No data available       Home Medications    Prior to Admission medications   Medication Sig Start Date End Date Taking? Authorizing Provider  acetaminophen-codeine (TYLENOL #3) 300-30 MG tablet Take 1 tablet by mouth every 6 (six) hours as needed for moderate pain.   Yes [provider]  albuterol (PROAIR HFA) 108 (90 BASE) MCG/ACT inhaler Inhale 2 puffs into the lungs every 6 (six) hours as needed for wheezing (wheezing).    Yes [provider]  amLODipine (NORVASC) 2.5 MG tablet Take 2.5 mg by mouth daily.  06/23/15  Yes [provider]  budesonide-formoterol (SYMBICORT) 160-4.5 MCG/ACT inhaler Inhale 2 puffs into the lungs 2 (two) times daily.   Yes [provider]  Cholecalciferol (VITAMIN D3) 1000 units  CAPS Take 1 capsule by mouth daily.   Yes [provider]  clopidogrel (PLAVIX) 75 MG tablet Take 75 mg by mouth every Monday, Wednesday, and Friday.    Yes [provider]  docusate sodium (COLACE) 50 MG capsule Take 100 mg by mouth as needed for mild constipation.   Yes [provider]  losartan (COZAAR) 25 MG tablet Take 25 mg by mouth daily.  10/12/16  Yes [provider]  meclizine (ANTIVERT) 25 MG tablet Take 25 mg by mouth every 8 (eight) hours as needed for dizziness (dizziness).    Yes [provider]  omeprazole (PRILOSEC) 20 MG capsule Take 20 mg by mouth daily before breakfast.     Yes [provider]  RESTASIS 0.05 % ophthalmic emulsion Place 2 drops into both eyes 2 (two) times daily.  03/30/16  Yes [provider]  TRAVATAN Z 0.004 % SOLN ophthalmic solution Place 1 drop into both eyes at bedtime.  03/29/16  Yes [provider]    Family History Family History  Problem Relation Age of Onset  . Stomach cancer Mother   . Stroke Brother   . Asthma Sister   . Colon cancer Brother     Social History Social History  Substance Use Topics  . Smoking status: Former Smoker    Packs/day: 0.50    Years: 60.00    Types: Cigarettes    Quit date: 07/31/2009  . Smokeless tobacco: Never Used  . Alcohol use No     Allergies   Aspirin and Morphine and related   Review of Systems Review of Systems  All other systems reviewed and are negative.    Physical Exam Updated Vital Signs BP 90/62   Pulse 77   Temp 98 F (36.7 C) (Oral)   Resp 20   Ht 5\' 4"  (1.626 m)   Wt 68 kg (150 lb)   SpO2 97%   BMI 25.75 kg/m   Physical Exam  Constitutional: She is oriented to person, place, and time. She appears well-developed. No distress.  Patient examined after nebulizer treatment.  HENT:  Head: Normocephalic and atraumatic.  Eyes: Pupils are equal, round, and reactive to light. Conjunctivae and EOM are normal.  Neck: Normal range of motion and phonation normal. Neck supple.  Cardiovascular: Normal rate and regular rhythm.   Pulmonary/Chest: Effort normal and breath sounds normal. No respiratory distress. She has no wheezes. She exhibits no tenderness.  Abdominal: Soft. She exhibits no distension. There is no tenderness. There is no guarding.  Musculoskeletal:  Prosthesis, right leg above knee.  Left leg without edema.  Neurological: She is alert and oriented to person, place, and time. She exhibits normal muscle tone.  Skin: Skin is warm and dry.  Psychiatric: She has a normal mood and affect. Her behavior is normal. Judgment and thought  content normal.  Nursing note and vitals reviewed.    ED Treatments / Results  Labs (all labs ordered are listed, but only abnormal results are displayed) Labs Reviewed  BASIC METABOLIC PANEL - Abnormal; Notable for the following:       Result Value   CO2 20 (*)    Glucose, Bld 105 (*)    Creatinine, Ser 1.13 (*)    Calcium 8.8 (*)    GFR calc non Af Amer 42 (*)    GFR calc Af Amer 49 (*)    All other components within normal limits  CBC  I-STAT TROPONIN, ED    EKG  EKG Interpretation  Date/Time:  Friday May 04 2017 09:36:36 EDT Ventricular Rate:  63 PR Interval:    QRS Duration: 100 QT Interval:  456 QTC Calculation: 466 R Axis:   -128 Text Interpretation:  Junctional rhythm Anterior infarct , age undetermined Artifact since last tracing no significant change Abnormal ECG Confirmed by Daleen Bo [phone]) on 05/04/2017 10:33:48 AM       Radiology Dg Chest 2 View  Result Date: 05/04/2017 CLINICAL DATA:  83 year old female with flu like symptoms following flu vaccine 6 days ago. Fever and body ache. EXAM: CHEST  2 VIEW COMPARISON:  03/16/2017 and earlier. FINDINGS: Stable lung volumes and ventilation. Curvilinear scarring in the left lower lobe. No pneumothorax, pulmonary edema, pleural effusion or acute pulmonary opacity. Mediastinal contours are stable and within normal limits. Osteopenia. No acute osseous abnormality identified. Negative visible bowel gas pattern. Abdominal Calcified aortic atherosclerosis. IMPRESSION: Stable.  No acute cardiopulmonary abnormality. Electronically Signed   By: Genevie Ann M.D.   On: 05/04/2017 10:27    Procedures Procedures (including critical care time)  Medications Ordered in ED Medications  albuterol (PROVENTIL) (2.5 MG/3ML) 0.083% nebulizer solution (  Not Given 05/04/17 0954)  albuterol (PROVENTIL) (2.5 MG/3ML) 0.083% nebulizer solution 5 mg (5 mg Nebulization Given 05/04/17 0940)     Initial Impression / Assessment and Plan  / ED Course  I have reviewed the triage vital signs and the nursing notes.  Pertinent labs & imaging results that were available during my care of the patient were reviewed by me and considered in my medical decision making (see chart for details).      Patient Vitals for the past 24 hrs:  BP Temp Temp src Pulse Resp SpO2 Height Weight  05/04/17 1235 90/62 - - 77 20 97 % - -  05/04/17 1130 (!) 90/57 - - 61 (!) 22 97 % - -  05/04/17 1115 (!) 101/54 - - 63 (!) 21 98 % - -  05/04/17 0922 - - - - - - 5\' 4"  (1.626 m) 68 kg (150 lb)  05/04/17 0914 (!) 112/52 98 F (36.7 C) Oral (!) 59 18 97 % - -    12:44 PM Reevaluation with update and discussion. After initial assessment and treatment, an updated evaluation reveals she remains comfortable and has no additional complaints.  Findings discussed with the patient and all questions were answered. Seydou Hearns L      Final Clinical Impressions(s) / ED Diagnoses   Final diagnoses:  Viral URI with cough    Nursing Notes Reviewed/ Care Coordinated Applicable Imaging Reviewed Interpretation of Laboratory Data incorporated into ED treatment  The patient appears reasonably screened and/or stabilized for discharge and I doubt any other medical condition or other [facility] requiring further screening, evaluation, or treatment in the ED at this time prior to discharge.  Plan: Home Medications-continue usual medications, use OTC symptomatic medications as needed; Home Treatments-rest, fluids; return here if the recommended treatment, does not improve the symptoms; Recommended follow up-PCP follow-up ASAP for checkup.    New Prescriptions New Prescriptions   No medications on file     Daleen Bo, MD 05/07/17 1315

## 2017-05-04 NOTE — ED Notes (Signed)
Pt attempted to use bedpan. Pt had no output.

## 2017-05-04 NOTE — ED Notes (Signed)
Patient in Xray

## 2017-05-08 ENCOUNTER — Ambulatory Visit: Payer: Self-pay | Admitting: Podiatry

## 2017-05-18 ENCOUNTER — Encounter (HOSPITAL_COMMUNITY): Payer: Self-pay

## 2017-05-18 ENCOUNTER — Ambulatory Visit (HOSPITAL_COMMUNITY)
Admission: RE | Admit: 2017-05-18 | Discharge: 2017-05-18 | Disposition: A | Discharge status: Home / Self Care | Payer: Medicare Other | Source: Ambulatory Visit | Attending: Internal Medicine | Admitting: Internal Medicine

## 2017-05-18 ENCOUNTER — Other Ambulatory Visit (HOSPITAL_BASED_OUTPATIENT_CLINIC_OR_DEPARTMENT_OTHER): Payer: Medicare Other

## 2017-05-18 DIAGNOSIS — Z902 Acquired absence of lung [part of]: Secondary | ICD-10-CM | POA: Diagnosis not present

## 2017-05-18 DIAGNOSIS — J701 Chronic and other pulmonary manifestations due to radiation: Secondary | ICD-10-CM | POA: Diagnosis not present

## 2017-05-18 DIAGNOSIS — I739 Peripheral vascular disease, unspecified: Secondary | ICD-10-CM

## 2017-05-18 DIAGNOSIS — C3432 Malignant neoplasm of lower lobe, left bronchus or lung: Secondary | ICD-10-CM | POA: Insufficient documentation

## 2017-05-18 DIAGNOSIS — J439 Emphysema, unspecified: Secondary | ICD-10-CM | POA: Diagnosis not present

## 2017-05-18 DIAGNOSIS — Z85118 Personal history of other malignant neoplasm of bronchus and lung: Secondary | ICD-10-CM | POA: Diagnosis not present

## 2017-05-18 DIAGNOSIS — I10 Essential (primary) hypertension: Secondary | ICD-10-CM | POA: Insufficient documentation

## 2017-05-18 DIAGNOSIS — J449 Chronic obstructive pulmonary disease, unspecified: Secondary | ICD-10-CM

## 2017-05-18 LAB — COMPREHENSIVE METABOLIC PANEL
ALT: 8 U/L (ref 0–55)
AST: 14 U/L (ref 5–34)
Albumin: 3.4 g/dL — ABNORMAL LOW (ref 3.5–5.0)
Alkaline Phosphatase: 60 U/L (ref 40–150)
Anion Gap: 9 mEq/L (ref 3–11)
BUN: 13.7 mg/dL (ref 7.0–26.0)
CO2: 23 mEq/L (ref 22–29)
Calcium: 9.3 mg/dL (ref 8.4–10.4)
Chloride: 111 mEq/L — ABNORMAL HIGH (ref 98–109)
Creatinine: 0.8 mg/dL (ref 0.6–1.1)
EGFR: >60 mL/min/{1.73_m2} (ref >60)
Glucose: 95 mg/dl (ref 70–140)
Potassium: 4.1 mEq/L (ref 3.5–5.1)
Sodium: 143 mEq/L (ref 136–145)
Total Bilirubin: 0.43 mg/dL (ref 0.20–1.20)
Total Protein: 6.6 g/dL (ref 6.4–8.3)

## 2017-05-18 LAB — CBC WITH DIFFERENTIAL/PLATELET
BASO%: 0.8 % (ref 0.0–2.0)
Basophils Absolute: 0 10*3/uL (ref 0.0–0.1)
EOS%: 1.3 % (ref 0.0–7.0)
Eosinophils Absolute: 0.1 10*3/uL (ref 0.0–0.5)
HCT: 38.8 % (ref 34.8–46.6)
HGB: 12.6 g/dL (ref 11.6–15.9)
LYMPH%: 37.5 % (ref 14.0–49.7)
MCH: 30 pg (ref 25.1–34.0)
MCHC: 32.6 g/dL (ref 31.5–36.0)
MCV: 92.2 fL (ref 79.5–101.0)
MONO#: 0.3 10*3/uL (ref 0.1–0.9)
MONO%: 7.8 % (ref 0.0–14.0)
NEUT#: 2.3 10*3/uL (ref 1.5–6.5)
NEUT%: 52.6 % (ref 38.4–76.8)
Platelets: 256 10*3/uL (ref 145–400)
RBC: 4.2 10*6/uL (ref 3.70–5.45)
RDW: 14.5 % (ref 11.2–14.5)
WBC: 4.4 10*3/uL (ref 3.9–10.3)
lymph#: 1.7 10*3/uL (ref 0.9–3.3)

## 2017-05-18 MED ORDER — IOPAMIDOL (ISOVUE-300) INJECTION 61%
INTRAVENOUS | Status: AC
  Administered 2017-05-18: 75 mL via INTRAVENOUS
  Filled 2017-05-18: qty 75

## 2017-05-18 MED ORDER — IOPAMIDOL (ISOVUE-300) INJECTION 61%
75.0000 mL | Freq: Once | INTRAVENOUS | Status: AC | PRN
  Administered 2017-05-18: 75 mL via INTRAVENOUS

## 2017-05-21 ENCOUNTER — Telehealth: Payer: Self-pay | Admitting: Internal Medicine

## 2017-05-21 ENCOUNTER — Encounter: Payer: Self-pay | Admitting: Internal Medicine

## 2017-05-21 ENCOUNTER — Ambulatory Visit (HOSPITAL_BASED_OUTPATIENT_CLINIC_OR_DEPARTMENT_OTHER): Payer: Medicare Other | Admitting: Internal Medicine

## 2017-05-21 VITALS — BP 171/52 | HR 44 | Temp 98.0°F | Resp 18 | Ht 64.0 in

## 2017-05-21 DIAGNOSIS — I10 Essential (primary) hypertension: Secondary | ICD-10-CM

## 2017-05-21 DIAGNOSIS — R001 Bradycardia, unspecified: Secondary | ICD-10-CM

## 2017-05-21 DIAGNOSIS — C3432 Malignant neoplasm of lower lobe, left bronchus or lung: Secondary | ICD-10-CM | POA: Diagnosis not present

## 2017-05-21 DIAGNOSIS — C3490 Malignant neoplasm of unspecified part of unspecified bronchus or lung: Secondary | ICD-10-CM

## 2017-05-21 NOTE — Telephone Encounter (Signed)
Scheduled appt per 10/22 los - sent reminder letter in the mail. Central radiology to contact patient with ct schedule.

## 2017-05-21 NOTE — Progress Notes (Signed)
[name] [phone]   [phone]  OFFICE PROGRESS NOTE  [name], MD [place] [address]  DIAGNOSIS: Stage IB (T2a, N0, M0) non-small cell lung cancer consistent with moderately differentiated adenocarcinoma diagnosed in September of 2014.   PRIOR THERAPY:  1) Bronchoscopy, right video-assisted thoracoscopy, minithoracotomy, right upper lobectomy with lymph node dissection under the care of Dr. Servando Snare on 06/16/2013. Tumor size was 3.5 CM and the myriad genetics showed high risk for recurrence. 2) status post a stereotactic radiotherapy to left lower lobe pulmonary nodule under the care of Dr. Tammi Klippel completed 07/19/2015.  CURRENT THERAPY: Observation.  INTERVAL HISTORY: [name] 83 y.o. female returns to the clinic today for six-month follow-up visit. The patient is feeling fine today with no specific complaints except for generalized aching pain. She denied having any chest pain, shortness of breath, cough or hemoptysis. She denied having any fever or chills. She has no nausea, vomiting, diarrhea or constipation. The patient has no significant weight loss or night sweats. She had repeat CT scan of the chest, abdomen and pelvis performed recently and she is here for evaluation and discussion of her scan results.   MEDICAL HISTORY: Past Medical History:  Diagnosis Date  . Arthritis   . Cancer ([place])    rt lung  . COPD GOLD I 04/18/2013  . Glaucoma   . Hyperlipidemia   . Hypertension   . Lung cancer ([place]) 06/07/2013  . Peripheral vascular disease, unspecified ([place])    with Claudication  . S/P radiation therapy 12/12, 12/15, and 07/19/15   The LLL nodule was treated to 54 Gy in 3 fractions of 18 Gy  . Shortness of breath   . Stroke [facility])     ALLERGIES:  is allergic to aspirin and morphine and related.  MEDICATIONS:  Current Outpatient Prescriptions  Medication Sig Dispense Refill  .  acetaminophen-codeine (TYLENOL #3) 300-30 MG tablet Take 1 tablet by mouth every 6 (six) hours as needed for moderate pain.    Marland Kitchen albuterol (PROAIR HFA) 108 (90 BASE) MCG/ACT inhaler Inhale 2 puffs into the lungs every 6 (six) hours as needed for wheezing (wheezing).     Marland Kitchen amLODipine (NORVASC) 2.5 MG tablet Take 2.5 mg by mouth daily.   3  . budesonide-formoterol (SYMBICORT) 160-4.5 MCG/ACT inhaler Inhale 2 puffs into the lungs 2 (two) times daily.    . Cholecalciferol (VITAMIN D3) 1000 units CAPS Take 1 capsule by mouth daily.    . clopidogrel (PLAVIX) 75 MG tablet Take 75 mg by mouth every Monday, Wednesday, and Friday.     . docusate sodium (COLACE) 50 MG capsule Take 100 mg by mouth as needed for mild constipation.    Marland Kitchen losartan (COZAAR) 50 MG tablet   3  . meclizine (ANTIVERT) 25 MG tablet Take 25 mg by mouth every 8 (eight) hours as needed for dizziness (dizziness).     Marland Kitchen omeprazole (PRILOSEC) 20 MG capsule Take 20 mg by mouth daily before breakfast.     . pravastatin (PRAVACHOL) 20 MG tablet   3  . RESTASIS 0.05 % ophthalmic emulsion Place 2 drops into both eyes 2 (two) times daily.     . TRAVATAN Z 0.004 % SOLN ophthalmic solution Place 1 drop into both eyes at bedtime.      No current facility-administered medications for this visit.     SURGICAL HISTORY:  Past Surgical History:  Procedure Laterality Date  . ABOVE KNEE  LEG AMPUTATION     Right  . ANGIOPLASTY / STENTING ILIAC  03/22/09   Aortogram-  left common iliac artery  . COLONOSCOPY  Feb. 2013  . HEMORRHOID SURGERY    . TONSILLECTOMY    . VIDEO ASSISTED THORACOSCOPY (VATS)/WEDGE RESECTION Right 06/16/2013   Procedure: VIDEO ASSISTED THORACOSCOPY (VATS)/WEDGE RESECTION;  Surgeon: [name], MD;  Location: [facility];  Service: Thoracic;  Laterality: Right;  Marland Kitchen VIDEO BRONCHOSCOPY Bilateral 04/10/2013   Procedure: VIDEO BRONCHOSCOPY WITH FLUORO;  Surgeon: Tanda Rockers, MD;  Location: [facility];  Service: Cardiopulmonary;   Laterality: Bilateral;  . VIDEO BRONCHOSCOPY N/A 06/16/2013   Procedure: VIDEO BRONCHOSCOPY;  Surgeon: [name], MD;  Location: [facility];  Service: Thoracic;  Laterality: N/A;    REVIEW OF SYSTEMS:  A comprehensive review of systems was negative except for: Constitutional: positive for fatigue Musculoskeletal: positive for arthralgias   PHYSICAL EXAMINATION: General appearance: alert, cooperative, fatigued and no distress Head: Normocephalic, without obvious abnormality, atraumatic Neck: no adenopathy, no JVD, supple, symmetrical, trachea midline and thyroid not enlarged, symmetric, no tenderness/mass/nodules Lymph nodes: Cervical, supraclavicular, and axillary nodes normal. Resp: clear to auscultation bilaterally Back: symmetric, no curvature. ROM normal. No CVA tenderness. Cardio: regular rate and rhythm, S1, S2 normal, no murmur, click, rub or gallop GI: soft, non-tender; bowel sounds normal; no masses,  no organomegaly Extremities: extremities normal, atraumatic, no cyanosis or edema  ECOG PERFORMANCE STATUS: 1 - Symptomatic but completely ambulatory  Blood pressure (!) 171/52, pulse (!) 44, temperature 98 F (36.7 C), temperature source Oral, resp. rate 18, height 5' 4"  (1.626 m).  LABORATORY DATA: Lab Results  Component Value Date   WBC 4.4 05/18/2017   HGB 12.6 05/18/2017   HCT 38.8 05/18/2017   MCV 92.2 05/18/2017   PLT 256 05/18/2017      Chemistry      Component Value Date/Time   NA 143 05/18/2017 0853   K 4.1 05/18/2017 0853   CL 108 05/04/2017 1110   CO2 23 05/18/2017 0853   BUN 13.7 05/18/2017 0853   CREATININE 0.8 05/18/2017 0853      Component Value Date/Time   CALCIUM 9.3 05/18/2017 0853   ALKPHOS 60 05/18/2017 0853   AST 14 05/18/2017 0853   ALT 8 05/18/2017 0853   BILITOT 0.43 05/18/2017 0853       RADIOGRAPHIC STUDIES: Dg Chest 2 View  Result Date: 05/04/2017 CLINICAL DATA:  83 year old female with flu like symptoms following flu  vaccine 6 days ago. Fever and body ache. EXAM: CHEST  2 VIEW COMPARISON:  03/16/2017 and earlier. FINDINGS: Stable lung volumes and ventilation. Curvilinear scarring in the left lower lobe. No pneumothorax, pulmonary edema, pleural effusion or acute pulmonary opacity. Mediastinal contours are stable and within normal limits. Osteopenia. No acute osseous abnormality identified. Negative visible bowel gas pattern. Abdominal Calcified aortic atherosclerosis. IMPRESSION: Stable.  No acute cardiopulmonary abnormality. Electronically Signed   By: Genevie Ann M.D.   On: 05/04/2017 10:27   Ct Chest W Contrast  Result Date: 05/18/2017 CLINICAL DATA:  Right-sided lung cancer. Status post right upper lobectomy on 06/16/2013. Also with stereotactic radiotherapy to left lower lobe pulmonary nodule completed 07/19/2015. EXAM: CT CHEST WITH CONTRAST TECHNIQUE: Multidetector CT imaging of the chest was performed during intravenous contrast administration. CONTRAST:  75 cc Isovue 300 COMPARISON:  10/16/2016 FINDINGS: Cardiovascular: Heart size upper normal with asymmetric enlargement of the right atrium and ventricle. Coronary artery calcification is evident. Atherosclerotic calcification is noted in the  wall of the thoracic aorta. Mediastinum/Nodes: No mediastinal lymphadenopathy. There is no hilar lymphadenopathy. The esophagus has normal imaging features. There is no axillary lymphadenopathy. Lungs/Pleura: Centrilobular emphysema noted bilaterally. Volume loss right hemithorax compatible with right upper lobectomy. Bandlike scarring in the left lower lobe is stable and compatible with radiation fibrosis. Lingular scarring is stable. Upper Abdomen: Unremarkable. Musculoskeletal: Bone windows reveal no worrisome lytic or sclerotic osseous lesions. IMPRESSION: 1. Stable exam.  No new or progressive findings. 2. Status post right upper lobectomy. 3. Post radiation fibrosis left lower lobe, unchanged. 4.  Emphysema. (ICD10-J43.9) 5.  Coronary artery and Aortic Atherosclerois (ICD10-170.0) Electronically Signed   By: Misty Stanley M.D.   On: 05/18/2017 13:33   Mm Digital Screening Bilateral  Result Date: 04/26/2017 CLINICAL DATA:  Screening. EXAM: DIGITAL SCREENING BILATERAL MAMMOGRAM WITH CAD COMPARISON:  Previous exam(s). ACR Breast Density Category a: The breast tissue is almost entirely fatty. FINDINGS: There are no findings suspicious for malignancy. Images were processed with CAD. IMPRESSION: No mammographic evidence of malignancy. A result letter of this screening mammogram will be mailed directly to the patient. RECOMMENDATION: Screening mammogram in one year. (Code:SM-B-01Y) BI-RADS CATEGORY  1: Negative. Electronically Signed   By: Ammie Ferrier M.D.   On: 04/26/2017 15:53    ASSESSMENT AND PLAN:  This is a very pleasant 83 years old African-American female with history of a stage IB non-small cell lung cancer, adenocarcinoma status post right upper lobectomy with lymph node dissection and has been observation since November 2014. The repeat CT scan of the chest performed recently showed no new or progressive disease. I discussed the scan results with the patient today. I recommended for her to continue on observation with repeat CT scan of the chest in 6 months. For the bradycardia, I advised the patient to hold her treatment with Norvasc and to contact her cardiologist and primary care physician for adjustment of her antihypertensive medication. The patient was advised to call immediately if she has any concerning symptoms in the interval. The patient voices understanding of current disease status and treatment options and is in agreement with the current care plan.  All questions were answered. The patient knows to call the clinic with any problems, questions or concerns. We can certainly see the patient much sooner if necessary.  Disclaimer: This note was dictated with voice recognition software. Similar sounding  words can inadvertently be transcribed and may not be corrected upon review.

## 2017-05-28 ENCOUNTER — Encounter: Payer: Self-pay | Admitting: Podiatry

## 2017-05-28 ENCOUNTER — Ambulatory Visit (INDEPENDENT_AMBULATORY_CARE_PROVIDER_SITE_OTHER): Payer: Medicare Other | Admitting: Podiatry

## 2017-05-28 DIAGNOSIS — B351 Tinea unguium: Secondary | ICD-10-CM

## 2017-05-28 DIAGNOSIS — D689 Coagulation defect, unspecified: Secondary | ICD-10-CM

## 2017-05-28 DIAGNOSIS — M79675 Pain in left toe(s): Secondary | ICD-10-CM | POA: Diagnosis not present

## 2017-05-28 DIAGNOSIS — L84 Corns and callosities: Secondary | ICD-10-CM

## 2017-05-28 DIAGNOSIS — I739 Peripheral vascular disease, unspecified: Secondary | ICD-10-CM | POA: Diagnosis not present

## 2017-05-28 NOTE — Progress Notes (Signed)
Patient ID: [name], female   DOB: [birth year], 83 y.o.   MRN: [id number]    Subjective: This patient presents today at her request complaining of painful toenails on her left foot request nail debridement.  Objective: Orientated 3 Amputation right lower extremity DP pulse left 0/4 PT pulses 0/4 Capillary reflex delayed left Sensation to 10 g monofilament wire intact 2/5 left Vibratory sensation nonreactive left Ankle reflex reactive left Hammertoe 2-5 left Patient uses walker Manual motor testing dorsi flexion, plantar flexion 5/5 left Keratoses distal second and third left toes and medial plantar left hallux The toenails are elongated, hypertrophic, discolored, deformed 1-5 left Absent hair growth No open skin lesions bilaterally Small keratoses plantar left hallux  Assessment: Symptomatic onychomycoses 1-5 keratoses x 3 peripheral arterial disease On Plavix, blood clotting disorder Plan: Debridement toenails 1-5 mechanically electronically without any bleeding Debrided keratoses 3without any bleeding  Reappoint 3 months

## 2017-07-11 ENCOUNTER — Ambulatory Visit: Payer: Medicare Other | Admitting: Family

## 2017-07-11 ENCOUNTER — Encounter (HOSPITAL_COMMUNITY): Payer: Medicare Other

## 2017-08-15 ENCOUNTER — Other Ambulatory Visit: Payer: Self-pay | Admitting: Cardiology

## 2017-08-15 DIAGNOSIS — R079 Chest pain, unspecified: Secondary | ICD-10-CM

## 2017-08-24 ENCOUNTER — Encounter (HOSPITAL_COMMUNITY)
Admission: RE | Admit: 2017-08-24 | Discharge: 2017-08-24 | Disposition: A | Discharge status: Home / Self Care | Payer: Medicare Other | Source: Ambulatory Visit | Attending: Cardiology | Admitting: Cardiology

## 2017-08-24 DIAGNOSIS — R079 Chest pain, unspecified: Secondary | ICD-10-CM | POA: Diagnosis present

## 2017-08-24 MED ORDER — TECHNETIUM TC 99M TETROFOSMIN IV KIT
10.0000 | PACK | Freq: Once | INTRAVENOUS | Status: AC | PRN
  Administered 2017-08-24: 10 via INTRAVENOUS

## 2017-08-24 MED ORDER — REGADENOSON 0.4 MG/5ML IV SOLN
INTRAVENOUS | Status: AC
  Administered 2017-08-24: 0.4 mg
  Filled 2017-08-24: qty 5

## 2017-08-24 MED ORDER — TECHNETIUM TC 99M TETROFOSMIN IV KIT
30.0000 | PACK | Freq: Once | INTRAVENOUS | Status: AC | PRN
  Administered 2017-08-24: 30 via INTRAVENOUS

## 2017-08-27 ENCOUNTER — Ambulatory Visit: Payer: Medicare Other | Admitting: Podiatry

## 2017-09-07 ENCOUNTER — Ambulatory Visit (HOSPITAL_COMMUNITY)
Admission: RE | Admit: 2017-09-07 | Discharge: 2017-09-07 | Disposition: A | Discharge status: Home / Self Care | Payer: Medicare Other | Source: Ambulatory Visit | Attending: Family | Admitting: Family

## 2017-09-07 ENCOUNTER — Ambulatory Visit (INDEPENDENT_AMBULATORY_CARE_PROVIDER_SITE_OTHER): Payer: Medicare Other | Admitting: Family

## 2017-09-07 ENCOUNTER — Encounter: Payer: Self-pay | Admitting: Family

## 2017-09-07 ENCOUNTER — Ambulatory Visit (INDEPENDENT_AMBULATORY_CARE_PROVIDER_SITE_OTHER)
Admission: RE | Admit: 2017-09-07 | Discharge: 2017-09-07 | Disposition: A | Discharge status: Home / Self Care | Payer: Medicare Other | Source: Ambulatory Visit | Attending: Family | Admitting: Family

## 2017-09-07 VITALS — BP 145/69 | HR 55 | Temp 97.8°F | Resp 18 | Ht 64.0 in | Wt 151.0 lb

## 2017-09-07 DIAGNOSIS — Z95828 Presence of other vascular implants and grafts: Secondary | ICD-10-CM | POA: Diagnosis not present

## 2017-09-07 DIAGNOSIS — I771 Stricture of artery: Secondary | ICD-10-CM | POA: Diagnosis present

## 2017-09-07 DIAGNOSIS — Z87891 Personal history of nicotine dependence: Secondary | ICD-10-CM

## 2017-09-07 DIAGNOSIS — Z89611 Acquired absence of right leg above knee: Secondary | ICD-10-CM | POA: Diagnosis not present

## 2017-09-07 DIAGNOSIS — I779 Disorder of arteries and arterioles, unspecified: Secondary | ICD-10-CM | POA: Diagnosis not present

## 2017-09-07 DIAGNOSIS — R9439 Abnormal result of other cardiovascular function study: Secondary | ICD-10-CM | POA: Diagnosis not present

## 2017-09-07 DIAGNOSIS — I739 Peripheral vascular disease, unspecified: Secondary | ICD-10-CM | POA: Diagnosis not present

## 2017-09-07 NOTE — Progress Notes (Signed)
[facility] [place]   CC: Follow up peripheral artery occlusive disease  History of Present Illness [name] is a 84 y.o. female patient of Dr. Scot Dock who has undergone previous left common iliac artery stenting in August of 2010. She has known infrainguinal arterial occlusive disease. She is status post previous right above-the-knee amputation and is ambulatory with her prosthesis.  She returns today after received phone call from pt to report having problems in left leg;c/o pain from left hip to ankle with walking. Reported she has almost fallen x 2 recently. Stated there is some pain in left leg at night, but it worsens with walking. Denied any (L) LE swelling. Denied any nonhealing sores of (L) LE.   She has multiple complaints: pain in both shoulders, phantom pain in right leg, also left leg hurts all the time, numbness at the lateral aspect left calf has been worsening.  Left flank pain that was evaluated in the ED recently. But states her balance is improving. Her walking seems limited to in her apt.   Her prosthesis is not working properly and is causing her to fall; she has worked with the [employer] with pr reported inadequate improvement. I referred her to [employer] at her last visit, but for unclear reasons pt sttes she needs to get back with [facility].  She denies non healing ulcers on lower extremities.  She had a VAT wedge resection right lung for cancer, November, 2014, states she had radiation treatments; she also reports it was recently found that she has a tumor in her left lung. 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  Pt states she was told that she has lots of arthritis in her back.  She sees a podiatrist every 3-4 months.  Patient states her blood pressure at home [phone], is elevated today. Home Health visits her periodically.  She states she was told that she had 3 TIA's, does not recall any symptoms, this  was possibly in 2010.  She seems to have tingling and numbness in both hands, unable to ascertain whether one is worse than the other.   She lives in an independent living facility.  [facility] is her prosthetist: [phone], [phone], [name]    Pt Diabetic: No Pt smoker: quit in 2011  Pt meds include:  Statin :Yes Betablocker: No ASA: No, severe allergic reaction Other anticoagulants/antiplatelets:Plavix     Past Medical History:  Diagnosis Date  . Arthritis   . Cancer ([place])    rt lung  . COPD GOLD I 04/18/2013  . Glaucoma   . Hyperlipidemia   . Hypertension   . Lung cancer ([place]) 06/07/2013  . Peripheral vascular disease, unspecified ([place])    with Claudication  . S/P radiation therapy 12/12, 12/15, and 07/19/15   The LLL nodule was treated to 54 Gy in 3 fractions of 18 Gy  . Shortness of breath   . Stroke [facility])     Social History Social History   Tobacco Use  . Smoking status: Former Smoker    Packs/day: 0.50    Years: 60.00    Pack years: 30.00    Types: Cigarettes    Last attempt to quit: 07/31/2009    Years since quitting: 8.1  . Smokeless tobacco: Never Used  Substance Use Topics  . Alcohol use: No  . Drug use: No    Family History Family History  Problem Relation Age of Onset  . Stomach cancer Mother   .  Stroke Brother   . Asthma Sister   . Colon cancer Brother     Past Surgical History:  Procedure Laterality Date  . ABOVE KNEE LEG AMPUTATION     Right  . ANGIOPLASTY / STENTING ILIAC  03/22/09   Aortogram-  left common iliac artery  . COLONOSCOPY  Feb. 2013  . HEMORRHOID SURGERY    . TONSILLECTOMY    . VIDEO ASSISTED THORACOSCOPY (VATS)/WEDGE RESECTION Right 06/16/2013   Procedure: VIDEO ASSISTED THORACOSCOPY (VATS)/WEDGE RESECTION;  Surgeon: [name], MD;  Location: [place];  Service: Thoracic;  Laterality: Right;  Marland Kitchen VIDEO BRONCHOSCOPY Bilateral 04/10/2013   Procedure: VIDEO  BRONCHOSCOPY WITH FLUORO;  Surgeon: Tanda Rockers, MD;  [facility];  Service: Cardiopulmonary;  Laterality: Bilateral;  . VIDEO BRONCHOSCOPY N/A 06/16/2013   Procedure: VIDEO BRONCHOSCOPY;  Surgeon: [name], MD;  [facility] OR;  Service: Thoracic;  Laterality: N/A;    Allergies  Allergen Reactions  . Aspirin Anaphylaxis  . Morphine And Related Other (See Comments)    confusion    Current Outpatient Medications  Medication Sig Dispense Refill  . acetaminophen-codeine (TYLENOL #3) 300-30 MG tablet Take 1 tablet by mouth every 6 (six) hours as needed for moderate pain.    Marland Kitchen albuterol (PROAIR HFA) 108 (90 BASE) MCG/ACT inhaler Inhale 2 puffs into the lungs every 6 (six) hours as needed for wheezing (wheezing).     Marland Kitchen amLODipine (NORVASC) 2.5 MG tablet Take 2.5 mg by mouth daily.   3  . budesonide-formoterol (SYMBICORT) 160-4.5 MCG/ACT inhaler Inhale 2 puffs into the lungs 2 (two) times daily.    . Cholecalciferol (VITAMIN D3) 1000 units CAPS Take 1 capsule by mouth daily.    . clopidogrel (PLAVIX) 75 MG tablet Take 75 mg by mouth every Monday, Wednesday, and Friday.     . docusate sodium (COLACE) 50 MG capsule Take 100 mg by mouth as needed for mild constipation.    Marland Kitchen losartan (COZAAR) 50 MG tablet   3  . meclizine (ANTIVERT) 25 MG tablet Take 25 mg by mouth every 8 (eight) hours as needed for dizziness (dizziness).     Marland Kitchen omeprazole (PRILOSEC) 20 MG capsule Take 20 mg by mouth daily before breakfast.     . pravastatin (PRAVACHOL) 20 MG tablet   3  . RESTASIS 0.05 % ophthalmic emulsion Place 2 drops into both eyes 2 (two) times daily.     . TRAVATAN Z 0.004 % SOLN ophthalmic solution Place 1 drop into both eyes at bedtime.      No current facility-administered medications for this visit.     ROS: See HPI for pertinent positives and negatives.   Physical Examination  Vitals:   09/07/17 1118 09/07/17 1122  BP: (!) 154/72 (!) 145/69  Pulse: (!) 55   Resp: 18    Temp: 97.8 F (36.6 C)   TempSrc: Oral   SpO2: 99%   Weight: 151 lb (68.5 kg)   Height: 5\' 4"  (1.626 m)    Body mass index is 25.92 kg/m.  General: A&O x 3, WDWN, elderly female Gait: slow, steady with walker  HENT: No gross abnormalities  Eyes: PERRLA  Pulmonary: Respirations are non labored, CTAB, without wheezes, rales, or rhonchi  Cardiac: regular rythm, no murmur detected  Carotid Bruits Left Right   Negative  positive  VASCULAR EXAM:  Radial pulses: 1+ palpable bilaterally  Abdominal aortic pulse is not palpable   Extremities: left leg without ischemic changes, right AKA with  prosthesis in place.  No open wounds. Toenails of left foot are thick and mycotic.   LE Pulses  LEFT  RIGHT   FEMORAL  2+ palpable 1+palpable   POPLITEAL  not palpable  AKA  POSTERIOR TIBIAL  Not palpable  AKA   DORSALIS PEDIS ANTERIOR TIBIAL  not palpable  AKA    Abdomen: soft, NT, no masses palpated. Aortic pulse is not palpable.  Skin: no rashes, no ulcers. Musculoskeletal: no muscle wasting or atrophy  Neurologic: A&O X 3; appropriate affect, MOTOR FUNCTION: moving all extremities equally, M/S 5/5. Speech is fluent/normal. Very hard of hearing.  Psychiatric: Thought content is normal, mood appropriate for clinical situation. Loquacious.     ASSESSMENT: Nicole DRUM is a 84 y.o. female who is s/p left common iliac artery stenting in August of 2010. She has known infrainguinal arterial occlusive disease. She is status post previous right above-the-knee amputation and is somewhat ambulatory with her prosthesis and walker.  She has no signs of ischemia in her left foot/leg.  I discussed with Dr. Trula Slade (12-18-16 visit) pt HPI, non invasive vascular studies from that day, and exam results; the multiple joint, low back, and radiculopathy type left leg pain is not c/w arterial occlusive disease. She has known arthritis "all over"  as she states, likely in her lumber spine. Deferred to her PCP to evaluate and manage this.   Pt is performing daily left leg exercises. She has a personal care aid coming to her house daily.   No change in left iliac artery duplex, no significant stenosis, no sign of ischemia in her left foot or leg, 2+ palpable left femoral pulse.  Pt states her current right AKA prosthetic is causing her to fall, is ill fitting; states she has tried working with the [employer] prosthetic representative and was told that if they try to adjust her prosthetic it will be worse. I referred her to [employer] at her last visit but pt it is unclear why pt wants to return to [employer].  I referred pt back to [facility] to get pt a new right AKA prosthetic that actually fits and that she can walk on; her current right AKA prosthesis has been adjusted multiple times and fits no better per pt.  I tried referring pt to [facility], but apparently she has to use [facility].    DATA   Left iliac artery stent duplex (09-07-17):  Decreased visualization of the abdominal vasculature due to overlying bowel gas.  Evidence suggestive of a patent left CIA stent based on limited visualization with biphasic left iliac waveforms. No significant change in stent velocities in comparison to the last exam on 03-08-16 and 12-15-16.   ABI (Date: 09/07/2017):  R: AKA  L:   ABI: 0.64 (was 0.53 on 12-15-16),   PT: mono (was mono)  DP: mono (was mono)  TBI: 0.22 (was 0.41) Improvement in left ABI from 53% to 64%, monophasic waveforms remain. Decline in TBI.    03-08-16 Carotid duplex suggests <40% bilateral ICA stenoses. No significant change compared to exam of 11/11/14.    Plan:  Continue daily seated leg exercises and walking with standby assist.  Follow-up in 6 monthswith ABI, 1 year with left aortoiliac duplex and Carotid Duplex, see me afterward for discussion of results and evaluation. I advised pt to notify us if she develops  concerns re the circulation in her feet or legs.   I discussed in depth with the patient the nature of atherosclerosis, and emphasized  the importance of maximal medical management including strict control of blood pressure, blood glucose, and lipid levels, obtaining regular exercise, and continued cessation of smoking.  The patient is aware that without maximal medical management the underlying atherosclerotic disease process will progress, limiting the benefit of any interventions.  The patient was given information about PAD including signs, symptoms, treatment, what symptoms should prompt the patient to seek immediate medical care, and risk reduction measures to take.  Clemon Chambers, RN, MSN, FNP-C [facility] Phone: [phone]  Clinic MD: Chen/Cain  09/07/17 11:28 AM

## 2017-09-07 NOTE — Patient Instructions (Addendum)
Before your next abdominal ultrasound:  Take two Extra-Strength Gas-X capsules at bedtime the night before the test. Take another two Extra-Strength Gas-X capsules 3 hours before the test.  Avoid gas forming foods the day before the test.       Peripheral Vascular Disease Peripheral vascular disease (PVD) is a disease of the blood vessels that are not part of your heart and brain. A simple term for PVD is poor circulation. In most cases, PVD narrows the blood vessels that carry blood from your heart to the rest of your body. This can result in a decreased supply of blood to your arms, legs, and internal organs, like your stomach or kidneys. However, it most often affects a person's lower legs and feet. There are two types of PVD.  Organic PVD. This is the more common type. It is caused by damage to the structure of blood vessels.  Functional PVD. This is caused by conditions that make blood vessels contract and tighten (spasm).  Without treatment, PVD tends to get worse over time. PVD can also lead to acute ischemic limb. This is when an arm or limb suddenly has trouble getting enough blood. This is a medical emergency. Follow these instructions at home:  Take medicines only as told by your doctor.  Do not use any tobacco products, including cigarettes, chewing tobacco, or electronic cigarettes. If you need help quitting, ask your doctor.  Lose weight if you are overweight, and maintain a healthy weight as told by your doctor.  Eat a diet that is low in fat and cholesterol. If you need help, ask your doctor.  Exercise regularly. Ask your doctor for some good activities for you.  Take good care of your feet. ? Wear comfortable shoes that fit well. ? Check your feet often for any cuts or sores. Contact a doctor if:  You have cramps in your legs while walking.  You have leg pain when you are at rest.  You have coldness in a leg or foot.  Your skin changes.  You are unable to  get or have an erection (erectile dysfunction).  You have cuts or sores on your feet that are not healing. Get help right away if:  Your arm or leg turns cold and blue.  Your arms or legs become red, warm, swollen, painful, or numb.  You have chest pain or trouble breathing.  You suddenly have weakness in your face, arm, or leg.  You become very confused or you cannot speak.  You suddenly have a very bad headache.  You suddenly cannot see. This information is not intended to replace advice given to you by your health care provider. Make sure you discuss any questions you have with your health care provider. Document Released: 10/11/2009 Document Revised: 12/23/2015 Document Reviewed: 12/25/2013 Elsevier Interactive Patient Education  2017 [employer].

## 2017-09-19 ENCOUNTER — Encounter (INDEPENDENT_AMBULATORY_CARE_PROVIDER_SITE_OTHER): Payer: Self-pay | Admitting: Physician Assistant

## 2017-09-19 ENCOUNTER — Ambulatory Visit (INDEPENDENT_AMBULATORY_CARE_PROVIDER_SITE_OTHER): Payer: Medicare Other | Admitting: Physician Assistant

## 2017-09-19 VITALS — BP 132/77 | HR 60 | Temp 97.4°F | Resp 18 | Ht 62.0 in | Wt 147.0 lb

## 2017-09-19 DIAGNOSIS — Z23 Encounter for immunization: Secondary | ICD-10-CM

## 2017-09-19 DIAGNOSIS — E2839 Other primary ovarian failure: Secondary | ICD-10-CM | POA: Diagnosis not present

## 2017-09-19 DIAGNOSIS — M2550 Pain in unspecified joint: Secondary | ICD-10-CM

## 2017-09-19 DIAGNOSIS — G8929 Other chronic pain: Secondary | ICD-10-CM | POA: Diagnosis not present

## 2017-09-19 DIAGNOSIS — Z79899 Other long term (current) drug therapy: Secondary | ICD-10-CM | POA: Diagnosis not present

## 2017-09-19 DIAGNOSIS — M545 Low back pain, unspecified: Secondary | ICD-10-CM

## 2017-09-19 DIAGNOSIS — G894 Chronic pain syndrome: Secondary | ICD-10-CM

## 2017-09-19 MED ORDER — GABAPENTIN 100 MG PO CAPS
100.0000 mg | ORAL_CAPSULE | Freq: Three times a day (TID) | ORAL | 3 refills | Status: AC
Start: 2017-09-19 — End: ?

## 2017-09-19 MED FILL — GABAPENTIN 100 MG CAPSULE: 100 | 30 days supply | Qty: 90 | Fill #0 | Status: TO

## 2017-09-19 NOTE — Progress Notes (Signed)
Subjective:  Patient ID: [name], female    DOB: 15-Apr-1928  Age: 84 y.o. MRN: [id number]  CC: pain  HPI [name] is a 84 y.o. female with a medical history of Lung cancer, COPD, HLD, HTN, PVD, CVA, AKA right leg, and arthritis presents as a new patient with need for pain relief of multiple joint pains and lower back pain. Was not content with the care of her previous PCP as she felt he was not successful in treating her chronic pains. Says she has constant pain in all the limbs, neck, and lower back but worse with movement. No recent imaging of her lower back available. Has allergies to aspirin and morphine. Currently only taking tylenol 500 mg for pain with little to no relief of symptoms. She is currently managed by oncology for her lung cancer. Next appointment is on 11/23/17. Does not endorse any other symptoms or complaints.   *Pt accompanied by younger friend which partially contributes to HPI.     Outpatient Medications Prior to Visit  Medication Sig Dispense Refill  . acetaminophen (TYLENOL) 500 MG tablet Take 500 mg by mouth every 6 (six) hours as needed.    Marland Kitchen albuterol (PROAIR HFA) 108 (90 BASE) MCG/ACT inhaler Inhale 2 puffs into the lungs every 6 (six) hours as needed for wheezing (wheezing).     Marland Kitchen amLODipine (NORVASC) 2.5 MG tablet Take 2.5 mg by mouth daily.   3  . budesonide-formoterol (SYMBICORT) 160-4.5 MCG/ACT inhaler Inhale 2 puffs into the lungs 2 (two) times daily.    . Cholecalciferol (VITAMIN D3) 1000 units CAPS Take 1 capsule by mouth daily.    . clopidogrel (PLAVIX) 75 MG tablet Take 75 mg by mouth every Monday, Wednesday, and Friday.     . docusate sodium (COLACE) 50 MG capsule Take 100 mg by mouth as needed for mild constipation.    . meclizine (ANTIVERT) 25 MG tablet Take 25 mg by mouth every 8 (eight) hours as needed for dizziness (dizziness).     Marland Kitchen omeprazole (PRILOSEC) 20 MG capsule Take 20 mg by mouth daily before breakfast.     . pravastatin  (PRAVACHOL) 20 MG tablet   3  . RESTASIS 0.05 % ophthalmic emulsion Place 2 drops into both eyes 2 (two) times daily.     . TRAVATAN Z 0.004 % SOLN ophthalmic solution Place 1 drop into both eyes at bedtime.     Marland Kitchen losartan (COZAAR) 50 MG tablet   3  . acetaminophen-codeine (TYLENOL #3) 300-30 MG tablet Take 1 tablet by mouth every 6 (six) hours as needed for moderate pain.     No facility-administered medications prior to visit.      ROS Review of Systems  Constitutional: Negative for chills, fever and malaise/fatigue.  Eyes: Negative for blurred vision.  Respiratory: Negative for shortness of breath.   Cardiovascular: Negative for chest pain and palpitations.  Gastrointestinal: Negative for abdominal pain and nausea.  Genitourinary: Negative for dysuria and hematuria.  Musculoskeletal: Positive for back pain, joint pain, myalgias and neck pain.  Skin: Negative for rash.  Neurological: Negative for tingling and headaches.  Psychiatric/Behavioral: Negative for depression. The patient is not nervous/anxious.     Objective:  BP 132/77 (BP Location: Left Arm, Patient Position: Sitting, Cuff Size: Large)   Pulse 60   Temp (!) 97.4 F (36.3 C) (Oral)   Resp 18   Ht 5\' 2"  (1.575 m)   Wt 147 lb (66.7 kg)   SpO2 100%  BMI 26.89 kg/m   BP/Weight 09/19/2017 09/07/2017 6/75/4492  Systolic BP 010 071 219  Diastolic BP 77 69 67  Wt. (Lbs) 147 151 -  BMI 26.89 25.92 -  Some encounter information is confidential and restricted. Go to Review Flowsheets activity to see all data.      Physical Exam  Constitutional: She is oriented to person, place, and time.  Elderly, frail, hard of hearing, NAD, polite  HENT:  Head: Normocephalic and atraumatic.  Eyes: Conjunctivae are normal. No scleral icterus.  Neck: Normal range of motion. Neck supple. No thyromegaly present.  Cardiovascular: Normal rate, regular rhythm and normal heart sounds.  No carotid bruits  Pulmonary/Chest: Effort normal  and breath sounds normal. No respiratory distress. She has no wheezes. She has no rales.  Musculoskeletal: She exhibits no edema or deformity.  Wears above the knee prosthetic on right leg  Neurological: She is alert and oriented to person, place, and time. No cranial nerve deficit. Coordination normal.  Skin: Skin is warm and dry. No rash noted. No erythema. No pallor.  Psychiatric: She has a normal mood and affect. Her behavior is normal. Thought content normal.  Vitals reviewed.    Assessment & Plan:    1. Arthralgia of multiple sites - Sedimentation Rate - C-reactive protein - ANA w/Reflex  2. Chronic bilateral low back pain without sciatica - DG Lumbar Spine Complete; Future  3. Chronic pain syndrome - Begin gabapentin (NEURONTIN) 100 MG capsule; Take 1 capsule (100 mg total) by mouth 3 (three) times daily.  Dispense: 90 capsule; Refill: 3  4. Need for Tdap vaccination - Tdap vaccine greater than or equal to 7yo IM  5. Estrogen deficiency - DG Bone Density; Future  6. Need for prophylactic vaccination against Streptococcus pneumoniae (pneumococcus) - Pneumococcal polysaccharide vaccine 23-valent greater than or equal to 2yo subcutaneous/IM  7. High risk medication use - CBC with Differential - Comprehensive metabolic panel   Meds ordered this encounter  Medications  . gabapentin (NEURONTIN) 100 MG capsule    Sig: Take 1 capsule (100 mg total) by mouth 3 (three) times daily.    Dispense:  90 capsule    Refill:  3    Order Specific Question:   Supervising Provider    Answer:   Tresa Garter [id number]    Follow-up: Return in about 4 weeks (around 10/17/2017) for back pain.   Clent Demark PA

## 2017-09-19 NOTE — Patient Instructions (Signed)

## 2017-09-20 LAB — COMPREHENSIVE METABOLIC PANEL
ALT: 5 IU/L (ref 0–32)
AST: 16 IU/L (ref 0–40)
Albumin/Globulin Ratio: 1.6 (ref 1.2–2.2)
Albumin: 4.2 g/dL (ref 3.5–4.7)
Alkaline Phosphatase: 65 IU/L (ref 39–117)
BUN/Creatinine Ratio: 13 (ref 12–28)
BUN: 15 mg/dL (ref 8–27)
Bilirubin Total: 0.4 mg/dL (ref 0.0–1.2)
CO2: 20 mmol/L (ref 20–29)
Calcium: 9.9 mg/dL (ref 8.7–10.3)
Chloride: 105 mmol/L (ref 96–106)
Creatinine, Ser: 1.12 mg/dL — ABNORMAL HIGH (ref 0.57–1.00)
GFR calc Af Amer: 51 mL/min/{1.73_m2} — ABNORMAL LOW (ref >59)
GFR calc non Af Amer: 44 mL/min/{1.73_m2} — ABNORMAL LOW (ref >59)
Globulin, Total: 2.7 g/dL (ref 1.5–4.5)
Glucose: 96 mg/dL (ref 65–99)
Potassium: 4.4 mmol/L (ref 3.5–5.2)
Sodium: 141 mmol/L (ref 134–144)
Total Protein: 6.9 g/dL (ref 6.0–8.5)

## 2017-09-20 LAB — CBC WITH DIFFERENTIAL/PLATELET
Basophils Absolute: 0 10*3/uL (ref 0.0–0.2)
Basos: 0 %
EOS (ABSOLUTE): 0 10*3/uL (ref 0.0–0.4)
Eos: 1 %
Hematocrit: 42.3 % (ref 34.0–46.6)
Hemoglobin: 13.5 g/dL (ref 11.1–15.9)
Immature Grans (Abs): 0 10*3/uL (ref 0.0–0.1)
Immature Granulocytes: 0 %
Lymphocytes Absolute: 2 10*3/uL (ref 0.7–3.1)
Lymphs: 34 %
MCH: 29.7 pg (ref 26.6–33.0)
MCHC: 31.9 g/dL (ref 31.5–35.7)
MCV: 93 fL (ref 79–97)
Monocytes Absolute: 0.3 10*3/uL (ref 0.1–0.9)
Monocytes: 5 %
Neutrophils Absolute: 3.4 10*3/uL (ref 1.4–7.0)
Neutrophils: 60 %
Platelets: 297 10*3/uL (ref 150–379)
RBC: 4.54 x10E6/uL (ref 3.77–5.28)
RDW: 14.6 % (ref 12.3–15.4)
WBC: 5.7 10*3/uL (ref 3.4–10.8)

## 2017-09-20 LAB — SEDIMENTATION RATE: Sed Rate: 23 mm/hr (ref 0–40)

## 2017-09-20 LAB — ANA W/REFLEX: Anti Nuclear Antibody(ANA): NEGATIVE

## 2017-09-20 LAB — C-REACTIVE PROTEIN: CRP: 6.2 mg/L — ABNORMAL HIGH (ref 0.0–4.9)

## 2017-09-21 ENCOUNTER — Telehealth (INDEPENDENT_AMBULATORY_CARE_PROVIDER_SITE_OTHER): Payer: Self-pay | Admitting: Physician Assistant

## 2017-09-21 NOTE — Telephone Encounter (Signed)
[name] her care giver called because she wants to know when Mrs [name] start taking Gabapentin she stop taking Tylenol  . Please, call her at  [phone] Thank you

## 2017-09-25 ENCOUNTER — Telehealth (INDEPENDENT_AMBULATORY_CARE_PROVIDER_SITE_OTHER): Payer: Self-pay | Admitting: Physician Assistant

## 2017-09-25 NOTE — Telephone Encounter (Signed)
Pt called to speak with the nurse to get clarification of what medication she need to take since she has 2 of the same medication with a different (mg) please follow up

## 2017-10-01 ENCOUNTER — Other Ambulatory Visit (INDEPENDENT_AMBULATORY_CARE_PROVIDER_SITE_OTHER): Payer: Self-pay | Admitting: Physician Assistant

## 2017-10-01 ENCOUNTER — Telehealth (INDEPENDENT_AMBULATORY_CARE_PROVIDER_SITE_OTHER): Payer: Self-pay | Admitting: Physician Assistant

## 2017-10-01 MED ORDER — CLOPIDOGREL BISULFATE 75 MG PO TABS: 75.0000 mg | ORAL_TABLET | ORAL | 3 refills | Status: DC

## 2017-10-01 NOTE — Telephone Encounter (Signed)
Sent plavix to [facility]. Please call patient and/or caregiver and let her know she must stop omeprazole as omeprazole and plavix will interact.

## 2017-10-01 NOTE — Telephone Encounter (Signed)
I try to called pt but did not answer the call an unable to leave message

## 2017-10-01 NOTE — Telephone Encounter (Signed)
Please verify the name of the medication she has two dosing for and forward to PCP to clarify for patient.

## 2017-10-01 NOTE — Telephone Encounter (Signed)
FWD to PCP. Cosmo Tetreault S Ellionna Buckbee, CMA  

## 2017-10-01 NOTE — Telephone Encounter (Signed)
PA [name] told her to give [place] a call when she run out of the rx  Plavix 75 MG Patient use [facility] pharmacy Thank you

## 2017-10-02 MED FILL — CLOPIDOGREL 75 MG TABLET: 75 | 84 days supply | Qty: 36 | Fill #0

## 2017-10-03 ENCOUNTER — Telehealth (INDEPENDENT_AMBULATORY_CARE_PROVIDER_SITE_OTHER): Payer: Self-pay | Admitting: Physician Assistant

## 2017-10-03 NOTE — Telephone Encounter (Signed)
Please advise. Nat Christen, CMA

## 2017-10-03 NOTE — Telephone Encounter (Signed)
Mrs Arrighi caregiver left a voice mail message saying that mrs Boyland is taking Gabapentin twice a day and she start having 2 days ago pain in her bones and she wants to do what she can do for the pain she is having  Thank you

## 2017-10-04 ENCOUNTER — Inpatient Hospital Stay: Admission: RE | Admit: 2017-10-04 | Payer: Medicare Other | Source: Ambulatory Visit

## 2017-10-04 NOTE — Telephone Encounter (Signed)
Have attempted to call patient several times and get no answer. Will try once more. Nat Christen, CMA

## 2017-10-05 ENCOUNTER — Encounter (INDEPENDENT_AMBULATORY_CARE_PROVIDER_SITE_OTHER): Payer: Self-pay

## 2017-10-05 NOTE — Telephone Encounter (Signed)
Unable to reach patient mailed letter to patient. Nat Christen, CMA

## 2017-10-05 NOTE — Telephone Encounter (Signed)
I don't think gabapentin is causing bone pain. She may continue twice per day or reduce to once per day and see if bone pain is reduced.

## 2017-10-05 NOTE — Telephone Encounter (Signed)
Attempted to call patient again, and no answer. Will mail medication information to patient. Nat Christen, CMA

## 2017-10-09 MED FILL — tiZANidine HCL 2 MG TABS: 2 | 30 days supply | Qty: 60 | Fill #0

## 2017-10-11 MED FILL — AMLODIPINE BESYLATE 5 MG TA: 5 | 7 days supply | Qty: 7 | Fill #0

## 2017-10-12 ENCOUNTER — Telehealth (INDEPENDENT_AMBULATORY_CARE_PROVIDER_SITE_OTHER): Payer: Self-pay | Admitting: Physician Assistant

## 2017-10-12 NOTE — Telephone Encounter (Signed)
Patient wants to talk to [place] regarding a letter she receive in the mail

## 2017-10-16 ENCOUNTER — Ambulatory Visit (INDEPENDENT_AMBULATORY_CARE_PROVIDER_SITE_OTHER): Payer: Medicare Other | Admitting: Physician Assistant

## 2017-10-18 ENCOUNTER — Ambulatory Visit (INDEPENDENT_AMBULATORY_CARE_PROVIDER_SITE_OTHER): Payer: Medicare Other | Admitting: Physician Assistant

## 2017-11-02 ENCOUNTER — Other Ambulatory Visit (INDEPENDENT_AMBULATORY_CARE_PROVIDER_SITE_OTHER): Payer: Self-pay | Admitting: Physician Assistant

## 2017-11-02 ENCOUNTER — Telehealth (INDEPENDENT_AMBULATORY_CARE_PROVIDER_SITE_OTHER): Payer: Self-pay | Admitting: Physician Assistant

## 2017-11-02 MED ORDER — BUDESONIDE-FORMOTEROL FUMARATE 160-4.5 MCG/ACT IN AERO: 2.0000 | INHALATION_SPRAY | Freq: Two times a day (BID) | RESPIRATORY_TRACT | 5 refills | Status: DC

## 2017-11-02 NOTE — Telephone Encounter (Signed)
Sent Symbicort to [facility].

## 2017-11-02 NOTE — Telephone Encounter (Signed)
Pt called to request a refill for budesonide-formoterol (SYMBICORT) 160-4.5 MCG/ACT inhaler Please sent it to [facility] pharmacy Please follow up

## 2017-11-03 ENCOUNTER — Emergency Department (HOSPITAL_COMMUNITY)
Admission: EM | Admit: 2017-11-03 | Discharge: 2017-11-03 | Disposition: A | Discharge status: Home / Self Care | Payer: Medicare Other | Attending: Emergency Medicine | Admitting: Emergency Medicine

## 2017-11-03 ENCOUNTER — Encounter (HOSPITAL_COMMUNITY): Payer: Self-pay

## 2017-11-03 ENCOUNTER — Emergency Department (HOSPITAL_COMMUNITY): Payer: Medicare Other

## 2017-11-03 DIAGNOSIS — I10 Essential (primary) hypertension: Secondary | ICD-10-CM | POA: Diagnosis not present

## 2017-11-03 DIAGNOSIS — Z87891 Personal history of nicotine dependence: Secondary | ICD-10-CM | POA: Diagnosis not present

## 2017-11-03 DIAGNOSIS — J449 Chronic obstructive pulmonary disease, unspecified: Secondary | ICD-10-CM | POA: Insufficient documentation

## 2017-11-03 DIAGNOSIS — R0789 Other chest pain: Secondary | ICD-10-CM | POA: Diagnosis not present

## 2017-11-03 DIAGNOSIS — Z79899 Other long term (current) drug therapy: Secondary | ICD-10-CM | POA: Insufficient documentation

## 2017-11-03 DIAGNOSIS — Z7902 Long term (current) use of antithrombotics/antiplatelets: Secondary | ICD-10-CM | POA: Diagnosis not present

## 2017-11-03 DIAGNOSIS — Z760 Encounter for issue of repeat prescription: Secondary | ICD-10-CM | POA: Diagnosis not present

## 2017-11-03 DIAGNOSIS — R0602 Shortness of breath: Secondary | ICD-10-CM | POA: Insufficient documentation

## 2017-11-03 LAB — I-STAT CHEM 8, ED
BUN: 13 mg/dL (ref 6–20)
Calcium, Ion: 1.11 mmol/L — ABNORMAL LOW (ref 1.15–1.40)
Chloride: 107 mmol/L (ref 101–111)
Creatinine, Ser: 0.9 mg/dL (ref 0.44–1.00)
Glucose, Bld: 103 mg/dL — ABNORMAL HIGH (ref 65–99)
HCT: 39 % (ref 36.0–46.0)
Hemoglobin: 13.3 g/dL (ref 12.0–15.0)
Potassium: 3.5 mmol/L (ref 3.5–5.1)
Sodium: 141 mmol/L (ref 135–145)
TCO2: 23 mmol/L (ref 22–32)

## 2017-11-03 LAB — I-STAT TROPONIN, ED: Troponin i, poc: 0 ng/mL (ref 0.00–0.08)

## 2017-11-03 MED ORDER — ALBUTEROL SULFATE (2.5 MG/3ML) 0.083% IN NEBU
5.0000 mg | INHALATION_SOLUTION | Freq: Once | RESPIRATORY_TRACT | Status: AC
  Administered 2017-11-03: 5 mg via RESPIRATORY_TRACT
  Filled 2017-11-03: qty 6

## 2017-11-03 MED ORDER — ALBUTEROL SULFATE HFA 108 (90 BASE) MCG/ACT IN AERS: 1.0000 | INHALATION_SPRAY | Freq: Once | RESPIRATORY_TRACT | Status: DC

## 2017-11-03 MED ORDER — BUDESONIDE-FORMOTEROL FUMARATE 160-4.5 MCG/ACT IN AERO: 2.0000 | INHALATION_SPRAY | Freq: Two times a day (BID) | RESPIRATORY_TRACT | 0 refills | Status: AC

## 2017-11-03 MED ORDER — ALBUTEROL SULFATE HFA 108 (90 BASE) MCG/ACT IN AERS: 2.0000 | INHALATION_SPRAY | Freq: Four times a day (QID) | RESPIRATORY_TRACT | 0 refills | Status: DC | PRN

## 2017-11-03 NOTE — ED Provider Notes (Signed)
[place] EMERGENCY DEPARTMENT Provider Note   CSN: [id number] Arrival date & time: 11/03/17  1205     History   Chief Complaint Chief Complaint  Patient presents with  . congestion/med refill    HPI BEVERELY [name] is a 84 y.o. female.  The history is provided by the patient. No language interpreter was used.    [name] is a 84 y.o. female who presents to the Emergency Department complaining of medication refill, congestion. She presents for evaluation of Symbicort refill. She has been taking Symbicort twice daily. She used her last puff last night. She called the pharmacy for refill and they were unable to give her one. She also reports several weeks to months of left shoulder pain that radiates to her left chest. Pain is worse with movement and activity. At times the pain radiates down her left upper extremity. She does report mild shortness of breath. Her shortness of breath is resolved after albuterol treatment. No significant cough, nausea, vomiting, abdominal pain. She does have a mild left lower extremity swelling and that is unchanged from baseline. She has a history of right a.k.a..  Past Medical History:  Diagnosis Date  . Arthritis   . Cancer ([place])    rt lung  . COPD GOLD I 04/18/2013  . Glaucoma   . Hyperlipidemia   . Hypertension   . Lung cancer ([place]) 06/07/2013  . Peripheral vascular disease, unspecified ([place])    with Claudication  . S/P radiation therapy 12/12, 12/15, and 07/19/15   The LLL nodule was treated to 54 Gy in 3 fractions of 18 Gy  . Shortness of breath   . Stroke [facility])     Patient Active Problem List   Diagnosis Date Noted  . Cough 02/15/2016  . Sepsis ([place]) 10/26/2015  . Vertigo 08/03/2015  . Full code status 05/10/2015  . Numbness-Left Heel and Toes  10/28/2014  . Sharp pain- Left Lateral Thigh and Right Stump 10/28/2014  . Cancer of lower lobe of left lung ([place]) 06/07/2013  . COPD GOLD I 04/18/2013  . Hemoptysis  04/10/2013  . Pain in joint, lower leg 03/19/2013  . Iliac artery stenosis, left ([place]) 03/13/2012  . Peripheral vascular disease, unspecified ([place]) 02/28/2012  . Chest pain 10/04/2011  . Hypertension 10/04/2011  . Hyperlipidemia 10/04/2011    Past Surgical History:  Procedure Laterality Date  . ABOVE KNEE LEG AMPUTATION     Right  . ANGIOPLASTY / STENTING ILIAC  03/22/09   Aortogram-  left common iliac artery  . COLONOSCOPY  Feb. 2013  . HEMORRHOID SURGERY    . TONSILLECTOMY    . VIDEO ASSISTED THORACOSCOPY (VATS)/WEDGE RESECTION Right 06/16/2013   Procedure: VIDEO ASSISTED THORACOSCOPY (VATS)/WEDGE RESECTION;  Surgeon: [name], MD;  Location: [facility];  Service: Thoracic;  Laterality: Right;  Marland Kitchen VIDEO BRONCHOSCOPY Bilateral 04/10/2013   Procedure: VIDEO BRONCHOSCOPY WITH FLUORO;  Surgeon: Tanda Rockers, MD;  Location: [facility];  Service: Cardiopulmonary;  Laterality: Bilateral;  . VIDEO BRONCHOSCOPY N/A 06/16/2013   Procedure: VIDEO BRONCHOSCOPY;  Surgeon: [name], MD;  Location: [facility] OR;  Service: Thoracic;  Laterality: N/A;     OB History   None      Home Medications    Prior to Admission medications   Medication Sig Start Date End Date Taking? Authorizing Provider  acetaminophen (TYLENOL) 500 MG tablet Take 500 mg by mouth every 6 (six) hours as needed.    [provider]  albuterol (PROAIR HFA) 108 (90 Base) MCG/ACT inhaler Inhale 2 puffs into the lungs every 6 (six) hours as needed for wheezing (wheezing). 11/03/17   Quintella Reichert, MD  amLODipine (NORVASC) 2.5 MG tablet Take 2.5 mg by mouth daily.  06/23/15   [provider]  budesonide-formoterol (SYMBICORT) 160-4.5 MCG/ACT inhaler Inhale 2 puffs into the lungs 2 (two) times daily. 11/03/17   Quintella Reichert, MD  Cholecalciferol (VITAMIN D3) 1000 units CAPS Take 1 capsule by mouth daily.    [provider]  clopidogrel (PLAVIX) 75 MG tablet Take 1 tablet (75 mg total) by mouth  every Monday, Wednesday, and Friday. 10/01/17   Clent Demark, PA-C  docusate sodium (COLACE) 50 MG capsule Take 100 mg by mouth as needed for mild constipation.    [provider]  gabapentin (NEURONTIN) 100 MG capsule Take 1 capsule (100 mg total) by mouth 3 (three) times daily. 09/19/17   Clent Demark, PA-C  losartan (COZAAR) 50 MG tablet  05/15/17   [provider]  meclizine (ANTIVERT) 25 MG tablet Take 25 mg by mouth every 8 (eight) hours as needed for dizziness (dizziness).     [provider]  pravastatin (PRAVACHOL) 20 MG tablet  05/04/17   [provider]  RESTASIS 0.05 % ophthalmic emulsion Place 2 drops into both eyes 2 (two) times daily.  03/30/16   [provider]  TRAVATAN Z 0.004 % SOLN ophthalmic solution Place 1 drop into both eyes at bedtime.  03/29/16   [provider]    Family History Family History  Problem Relation Age of Onset  . Stomach cancer Mother   . Stroke Brother   . Asthma Sister   . Colon cancer Brother     Social History Social History   Tobacco Use  . Smoking status: Former Smoker    Packs/day: 0.00    Years: 60.00    Pack years: 0.00    Types: Cigarettes    Last attempt to quit: 07/31/2009    Years since quitting: 8.2  . Smokeless tobacco: Never Used  Substance Use Topics  . Alcohol use: No  . Drug use: No     Allergies   Aspirin and Morphine and related   Review of Systems Review of Systems  All other systems reviewed and are negative.    Physical Exam Updated Vital Signs BP (!) 141/106   Pulse 65   Temp (!) 97.4 F (36.3 C) (Oral)   Resp 16   SpO2 100%   Physical Exam  Constitutional: She is oriented to person, place, and time. She appears well-developed and well-nourished.  HENT:  Head: Normocephalic and atraumatic.  Cardiovascular: Normal rate and regular rhythm.  No murmur heard. Pulmonary/Chest: Effort normal and breath sounds normal. No respiratory  distress.  Left-sided chest wall tenderness to palpation.  Abdominal: Soft. There is no tenderness. There is no rebound and no guarding.  Musculoskeletal: She exhibits no edema.  2+ DP pulses of the left lower extremity. 2+ radial pulses bilaterally. Right lower extremity prosthesis.  Neurological: She is alert and oriented to person, place, and time.  Skin: Skin is warm and dry.  Psychiatric: She has a normal mood and affect. Her behavior is normal.  Nursing note and vitals reviewed.    ED Treatments / Results  Labs (all labs ordered are listed, but only abnormal results are displayed) Labs Reviewed  I-STAT CHEM 8, ED - Abnormal; Notable for the following components:  Result Value   Glucose, Bld 103 (*)    Calcium, Ion 1.11 (*)    All other components within normal limits  I-STAT TROPONIN, ED    EKG EKG Interpretation  Date/Time:  Saturday November 03 2017 13:29:30 EDT Ventricular Rate:  62 PR Interval:    QRS Duration: 113 QT Interval:  448 QTC Calculation: 455 R Axis:   53 Text Interpretation:  Sinus rhythm Low voltage, extremity leads Consider anterolateral infarct  - present on prior EKG 05/04/17 Confirmed by Quintella Reichert [phone]) on 11/03/2017 1:33:17 PM   Radiology Dg Chest 2 View  Result Date: 11/03/2017 CLINICAL DATA:  Pt ran out of inhaler this morning, onset shortness of breath, left sided CP and dizziness last night. Talking in complete sentences. Hx of HTN, pt takes meds for this. Hx of COPD, lung cancer, stroke and SOB. Pt had had a VATs/wedge resection in 2014. EXAM: CHEST - 2 VIEW COMPARISON:  05/04/2017 FINDINGS: Cardiac silhouette is borderline enlarged. No mediastinal or hilar masses. No evidence of adenopathy. Stable scarring in the left lower lobe. Lungs otherwise clear. No pleural effusion or pneumothorax. Skeletal structures are intact. IMPRESSION: No active cardiopulmonary disease. Electronically Signed   By: Lajean Manes M.D.   On: 11/03/2017 14:41     Procedures Procedures (including critical care time)  Medications Ordered in ED Medications  albuterol (PROVENTIL) (2.5 MG/3ML) 0.083% nebulizer solution 5 mg (5 mg Nebulization Given 11/03/17 1221)     Initial Impression / Assessment and Plan / ED Course  I have reviewed the triage vital signs and the nursing notes.  Pertinent labs & imaging results that were available during my care of the patient were reviewed by me and considered in my medical decision making (see chart for details).     Patient here for evaluation of medication refill as well as left-sided chest pain that has been ongoing for weeks to months. She is out of her Symbicort and had some shortness of breath prior to ED arrival. After albuterol treatment of the department her shortness of breath has completely resolved. Will refill Symbicort and provide albuterol MDI for use as needed. No evidence of severe COPD exacerbation at this time. In terms of her chest pain this reproducible on examination with palpation range of motion of the left shoulder. Presentation is not consistent with septic arthritis, acute fracture, pneumonia, PE, ACS. Discussed with patient home care for chest wall pain as well as COPD. Discussed importance of outpatient follow-up and return precautions.  Final Clinical Impressions(s) / ED Diagnoses   Final diagnoses:  Chest wall pain  Encounter for medication refill  Shortness of breath    ED Discharge Orders        Ordered    budesonide-formoterol (SYMBICORT) 160-4.5 MCG/ACT inhaler  2 times daily     11/03/17 1525    albuterol (PROAIR HFA) 108 (90 Base) MCG/ACT inhaler  Every 6 hours PRN     11/03/17 1526       Quintella Reichert, MD 11/03/17 1527

## 2017-11-03 NOTE — ED Notes (Signed)
Pt ran out of inhaler this morning, onset shortness of breath last night.  Talking in complete sentences.

## 2017-11-08 ENCOUNTER — Other Ambulatory Visit: Payer: Medicare Other

## 2017-11-15 ENCOUNTER — Other Ambulatory Visit: Payer: Self-pay | Admitting: *Deleted

## 2017-11-15 DIAGNOSIS — C3432 Malignant neoplasm of lower lobe, left bronchus or lung: Secondary | ICD-10-CM

## 2017-11-16 ENCOUNTER — Ambulatory Visit (HOSPITAL_COMMUNITY): Payer: Medicare Other

## 2017-11-16 ENCOUNTER — Other Ambulatory Visit: Payer: Medicare Other

## 2017-11-19 ENCOUNTER — Ambulatory Visit (HOSPITAL_COMMUNITY)
Admission: RE | Admit: 2017-11-19 | Discharge: 2017-11-19 | Disposition: A | Discharge status: Home / Self Care | Payer: Medicare Other | Source: Ambulatory Visit | Attending: Internal Medicine | Admitting: Internal Medicine

## 2017-11-19 ENCOUNTER — Encounter (HOSPITAL_COMMUNITY): Payer: Self-pay

## 2017-11-19 ENCOUNTER — Ambulatory Visit: Payer: Medicare Other | Admitting: Internal Medicine

## 2017-11-19 DIAGNOSIS — Z902 Acquired absence of lung [part of]: Secondary | ICD-10-CM | POA: Diagnosis not present

## 2017-11-19 DIAGNOSIS — J439 Emphysema, unspecified: Secondary | ICD-10-CM | POA: Diagnosis not present

## 2017-11-19 DIAGNOSIS — C3432 Malignant neoplasm of lower lobe, left bronchus or lung: Secondary | ICD-10-CM | POA: Diagnosis not present

## 2017-11-19 DIAGNOSIS — J984 Other disorders of lung: Secondary | ICD-10-CM | POA: Insufficient documentation

## 2017-11-19 DIAGNOSIS — I10 Essential (primary) hypertension: Secondary | ICD-10-CM | POA: Diagnosis not present

## 2017-11-19 DIAGNOSIS — I700 Atherosclerosis of aorta: Secondary | ICD-10-CM | POA: Insufficient documentation

## 2017-11-19 DIAGNOSIS — I2510 Atherosclerotic heart disease of native coronary artery without angina pectoris: Secondary | ICD-10-CM | POA: Diagnosis not present

## 2017-11-19 DIAGNOSIS — Z923 Personal history of irradiation: Secondary | ICD-10-CM | POA: Diagnosis not present

## 2017-11-19 DIAGNOSIS — C3490 Malignant neoplasm of unspecified part of unspecified bronchus or lung: Secondary | ICD-10-CM

## 2017-11-19 MED ORDER — IOHEXOL 300 MG/ML  SOLN
75.0000 mL | Freq: Once | INTRAMUSCULAR | Status: AC | PRN
  Administered 2017-11-19: 75 mL via INTRAVENOUS

## 2017-12-12 ENCOUNTER — Telehealth: Payer: Self-pay | Admitting: Medical Oncology

## 2017-12-12 NOTE — Telephone Encounter (Signed)
Wants scan results -no f/u scheduled. schedule request sent

## 2017-12-19 ENCOUNTER — Ambulatory Visit: Payer: Medicare Other | Admitting: Podiatry

## 2017-12-20 ENCOUNTER — Encounter: Payer: Self-pay | Admitting: Podiatry

## 2017-12-20 ENCOUNTER — Ambulatory Visit (INDEPENDENT_AMBULATORY_CARE_PROVIDER_SITE_OTHER): Payer: Medicare Other | Admitting: Podiatry

## 2017-12-20 DIAGNOSIS — B351 Tinea unguium: Secondary | ICD-10-CM | POA: Diagnosis not present

## 2017-12-20 DIAGNOSIS — M79675 Pain in left toe(s): Secondary | ICD-10-CM | POA: Diagnosis not present

## 2017-12-20 DIAGNOSIS — D689 Coagulation defect, unspecified: Secondary | ICD-10-CM

## 2017-12-20 DIAGNOSIS — L84 Corns and callosities: Secondary | ICD-10-CM | POA: Diagnosis not present

## 2017-12-20 DIAGNOSIS — I739 Peripheral vascular disease, unspecified: Secondary | ICD-10-CM

## 2017-12-20 NOTE — Progress Notes (Signed)
Subjective:   Patient ID: [name], female   DOB: 84 y.o.   MRN: [id number]   HPI Patient presents with severely thickened nailbeds 1 through 5 left foot with keratotic lesions distal digits 2 3 left and plantar first metatarsal left with history of amputation right with patient having severe vascular disease   ROS      Objective:  Physical Exam  At risk patient with severe mycotic nail infection that are painful and lesions on the left foot that are painful     Assessment:  Discussed the thickened nailbeds and the lesion formation     Plan:  Debridement of nailbeds 1 through 5 left and lesions x3 left with no iatrogenic bleeding reappoint for routine care

## 2018-03-13 ENCOUNTER — Encounter (HOSPITAL_COMMUNITY): Payer: Medicare Other

## 2018-03-13 ENCOUNTER — Ambulatory Visit: Payer: Medicare Other | Admitting: Family

## 2018-03-14 ENCOUNTER — Ambulatory Visit (HOSPITAL_COMMUNITY)
Admission: RE | Admit: 2018-03-14 | Discharge: 2018-03-14 | Disposition: A | Discharge status: Home / Self Care | Payer: Medicare Other | Source: Ambulatory Visit | Attending: Family | Admitting: Family

## 2018-03-14 ENCOUNTER — Encounter: Payer: Self-pay | Admitting: Family

## 2018-03-14 ENCOUNTER — Ambulatory Visit (INDEPENDENT_AMBULATORY_CARE_PROVIDER_SITE_OTHER): Payer: Medicare Other | Admitting: Family

## 2018-03-14 ENCOUNTER — Other Ambulatory Visit: Payer: Self-pay

## 2018-03-14 VITALS — BP 161/77 | HR 51 | Temp 97.0°F | Resp 20 | Ht 62.0 in | Wt 145.0 lb

## 2018-03-14 DIAGNOSIS — I739 Peripheral vascular disease, unspecified: Secondary | ICD-10-CM | POA: Diagnosis not present

## 2018-03-14 DIAGNOSIS — I771 Stricture of artery: Secondary | ICD-10-CM

## 2018-03-14 DIAGNOSIS — Z95828 Presence of other vascular implants and grafts: Secondary | ICD-10-CM | POA: Diagnosis not present

## 2018-03-14 DIAGNOSIS — Z87891 Personal history of nicotine dependence: Secondary | ICD-10-CM | POA: Diagnosis not present

## 2018-03-14 DIAGNOSIS — Z89611 Acquired absence of right leg above knee: Secondary | ICD-10-CM | POA: Diagnosis not present

## 2018-03-14 DIAGNOSIS — I779 Disorder of arteries and arterioles, unspecified: Secondary | ICD-10-CM

## 2018-03-14 NOTE — Patient Instructions (Addendum)
Before your next abdominal ultrasound:  Avoid gas forming foods and beverages the day before the test.   Take two Extra-Strength Gas-X capsules at bedtime the night before the test. Take another two Extra-Strength Gas-X capsules in the middle of the night if you get up to the restroom, if not, first thing in the morning with water.  Do not chew gum.       Peripheral Vascular Disease Peripheral vascular disease (PVD) is a disease of the blood vessels that are not part of your heart and brain. A simple term for PVD is poor circulation. In most cases, PVD narrows the blood vessels that carry blood from your heart to the rest of your body. This can result in a decreased supply of blood to your arms, legs, and internal organs, like your stomach or kidneys. However, it most often affects a person's lower legs and feet. There are two types of PVD.  Organic PVD. This is the more common type. It is caused by damage to the structure of blood vessels.  Functional PVD. This is caused by conditions that make blood vessels contract and tighten (spasm).  Without treatment, PVD tends to get worse over time. PVD can also lead to acute ischemic limb. This is when an arm or limb suddenly has trouble getting enough blood. This is a medical emergency. Follow these instructions at home:  Take medicines only as told by your doctor.  Do not use any tobacco products, including cigarettes, chewing tobacco, or electronic cigarettes. If you need help quitting, ask your doctor.  Lose weight if you are overweight, and maintain a healthy weight as told by your doctor.  Eat a diet that is low in fat and cholesterol. If you need help, ask your doctor.  Exercise regularly. Ask your doctor for some good activities for you.  Take good care of your feet. ? Wear comfortable shoes that fit well. ? Check your feet often for any cuts or sores. Contact a doctor if:  You have cramps in your legs while walking.  You have  leg pain when you are at rest.  You have coldness in a leg or foot.  Your skin changes.  You are unable to get or have an erection (erectile dysfunction).  You have cuts or sores on your feet that are not healing. Get help right away if:  Your arm or leg turns cold and blue.  Your arms or legs become red, warm, swollen, painful, or numb.  You have chest pain or trouble breathing.  You suddenly have weakness in your face, arm, or leg.  You become very confused or you cannot speak.  You suddenly have a very bad headache.  You suddenly cannot see. This information is not intended to replace advice given to you by your health care provider. Make sure you discuss any questions you have with your health care provider. Document Released: 10/11/2009 Document Revised: 12/23/2015 Document Reviewed: 12/25/2013 Elsevier Interactive Patient Education  2017 [employer].

## 2018-03-14 NOTE — Progress Notes (Signed)
[facility] [place]   CC: Follow up extracranial carotid artery stenosis, peripheral artery occlusive disease  History of Present Illness [name] is a 84 y.o. female patient of Dr. Scot Dock who has undergone previous left common iliac artery stenting in August of 2010. She has known infrainguinal arterial occlusive disease. She is status post previous right above-the-knee amputation and is ambulatory with her prosthesis.   09-07-17 she returnedafter received phone call from ptto report having problems in left leg;c/o pain from left hip to ankle with walking. Reported she has almost fallen x 2 recently. Stated there is some pain in left leg at night, but it worsens with walking. Denied any (L) LE swelling. Denied any nonhealing sores of (L) LE. Her prosthesis is now working properly and is no longer causing her to fall;she has worked with the [employer]    She has multiple complaints: pain in both shoulders, phantom pain in right leg, also left leg hurts all the time, numbness at the lateral aspect left calf has been worsening.  Left flank pain that was evaluated in the ED recently. But states her balance is improving. Her walking seems limited to in her apt.  She denies non healing ulcers on lower extremities.  She had a VAT wedge resection right lung for cancer, November, 2014, states shehadradiation treatments; she also reports it was recently found that she has a tumor in her left lung. 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  She has cramps in her bilateral flanks and left calf.   Pt states she was told that she has lots of arthritis in her back. She sees a podiatrist every 3-4 months.  Patient states her blood pressure at home [phone], is elevated today. Home Health visits her periodically.  She states she was told that she had 3 TIA's, does not recall any symptoms, this was possibly in 2010.  She seems to have  tingling and numbness in both hands, unable to ascertain whether one is worse than the other.  She lives in an independent living facility.  She states she has an appointment next week with a podiatrist, also has an upcoming appointment with audiologist.   [name] is her prosthetist: [phone], [phone], [email or web address]   Diabetic: No Tobacco use: quit in 2011  Pt meds include:  Statin :Yes Betablocker: No ASA: No, severe allergic reaction Other anticoagulants/antiplatelets:Plavix     Past Medical History:  Diagnosis Date  . Arthritis   . Cancer ([place])    rt lung  . COPD GOLD I 04/18/2013  . Glaucoma   . Hyperlipidemia   . Hypertension   . Lung cancer ([place]) 06/07/2013  . Peripheral vascular disease, unspecified ([place])    with Claudication  . S/P radiation therapy 12/12, 12/15, and 07/19/15   The LLL nodule was treated to 54 Gy in 3 fractions of 18 Gy  . Shortness of breath   . Stroke [facility])     Social History Social History   Tobacco Use  . Smoking status: Former Smoker    Packs/day: 0.00    Years: 60.00    Pack years: 0.00    Types: Cigarettes    Last attempt to quit: 07/31/2009    Years since quitting: 8.6  . Smokeless tobacco: Never Used  Substance Use Topics  . Alcohol use: No  . Drug use: No    Family History Family History  Problem Relation Age of Onset  . Stomach cancer Mother   .  Stroke Brother   . Asthma Sister   . Colon cancer Brother     Past Surgical History:  Procedure Laterality Date  . ABOVE KNEE LEG AMPUTATION     Right  . ANGIOPLASTY / STENTING ILIAC  03/22/09   Aortogram-  left common iliac artery  . COLONOSCOPY  Feb. 2013  . HEMORRHOID SURGERY    . TONSILLECTOMY    . VIDEO ASSISTED THORACOSCOPY (VATS)/WEDGE RESECTION Right 06/16/2013   Procedure: VIDEO ASSISTED THORACOSCOPY (VATS)/WEDGE RESECTION;  Surgeon: [name], MD;  Location: [facility];  Service: Thoracic;  Laterality:  Right;  Marland Kitchen VIDEO BRONCHOSCOPY Bilateral 04/10/2013   Procedure: VIDEO BRONCHOSCOPY WITH FLUORO;  Surgeon: Tanda Rockers, MD;  Location: [facility];  Service: Cardiopulmonary;  Laterality: Bilateral;  . VIDEO BRONCHOSCOPY N/A 06/16/2013   Procedure: VIDEO BRONCHOSCOPY;  Surgeon: [name], MD;  Location: [facility] OR;  Service: Thoracic;  Laterality: N/A;    Allergies  Allergen Reactions  . Aspirin Anaphylaxis  . Morphine And Related Other (See Comments)    confusion    Current Outpatient Medications  Medication Sig Dispense Refill  . amLODipine (NORVASC) 2.5 MG tablet Take 2.5 mg by mouth daily.   3  . amLODipine (NORVASC) 5 MG tablet Take 5 mg by mouth daily.  3  . budesonide-formoterol (SYMBICORT) 160-4.5 MCG/ACT inhaler Inhale 2 puffs into the lungs 2 (two) times daily. 1 Inhaler 0  . Cholecalciferol (VITAMIN D3) 1000 units CAPS Take 1 capsule by mouth daily.    . clopidogrel (PLAVIX) 75 MG tablet Take 1 tablet (75 mg total) by mouth every Monday, Wednesday, and Friday. 90 tablet 3  . docusate sodium (COLACE) 50 MG capsule Take 100 mg by mouth as needed for mild constipation.    . gabapentin (NEURONTIN) 100 MG capsule Take 1 capsule (100 mg total) by mouth 3 (three) times daily. 90 capsule 3  . losartan (COZAAR) 50 MG tablet   3  . meclizine (ANTIVERT) 25 MG tablet Take 25 mg by mouth every 8 (eight) hours as needed for dizziness (dizziness).     Marland Kitchen omeprazole (PRILOSEC) 20 MG capsule   1  . RESTASIS 0.05 % ophthalmic emulsion Place 2 drops into both eyes 2 (two) times daily.     Marland Kitchen tiZANidine (ZANAFLEX) 2 MG tablet   2  . TRAVATAN Z 0.004 % SOLN ophthalmic solution Place 1 drop into both eyes at bedtime.     . pravastatin (PRAVACHOL) 20 MG tablet   3   No current facility-administered medications for this visit.     ROS: See HPI for pertinent positives and negatives.   Physical Examination  Vitals:   03/14/18 1051 03/14/18 1054  BP: (!) 160/60 (!) 161/77  Pulse: (!) 51    Resp: 20   Temp: (!) 97 F (36.1 C)   TempSrc: Oral   SpO2: 100%   Weight: 145 lb (65.8 kg)   Height: 5\' 2"  (1.575 m)    Body mass index is 26.52 kg/m.  General: A&O x 3, WDWN,elderly female Gait: slow, steady with walker and prosthesis.  HENT: No gross abnormalities  Eyes: PERRLA  Pulmonary:Respirations are non labored,CTAB, without wheezes, rales,or rhonchi  Cardiac: regularrythm, no murmur detected  Carotid Bruits Left Right   Negative  positive  VASCULAR EXAM:  Radial pulses:1+ palpable bilaterally  Abdominal aortic pulse is not palpable   Extremities: left leg without ischemic changes, right AKA with prosthesis in place.  No open wounds. Toenails of left foot are  thick and mycotic.   LE Pulses  LEFT  RIGHT   FEMORAL  2+ palpable 1+palpable   POPLITEAL  not palpable  AKA  POSTERIOR TIBIAL  Notpalpable, no Doppler signal AKA   DORSALIS PEDIS ANTERIOR TIBIAL  not palpable, + biphasic sounding Doppler signal  AKA    Abdomen: soft, NT, no masses palpated. Abdominal aortic pulse is not palpable.  Musculoskeletal: no muscle wasting or atrophy  Skin: no rashes, no cellulitis, no ulcers noted. Neurologic: A&O X 3; appropriate affect, MOTOR FUNCTION: moving all extremities equally, M/S 5/5. Speech is fluent/normal. Very hard of hearing. Psychiatric: Thought content is normal, mood appropriate for clinical situation. Loquacious.    ASSESSMENT: LEKESHA CLAW is a 84 y.o. female who is s/p left common iliac artery stenting in August of 2010. She has known infrainguinal arterial occlusive disease. She is status post previous right above-the-knee amputation and is ambulatory with her prosthesisand walker.  Bilateral femoral pulses are palpable.  She has no signs of ischemia in her left foot/leg.  Last time she was here (09-07-17) I requested ABI in 6 months, carotid duplex and aortoiliac duplex in a year.  Instead she had carotid duplex only today.   I discussedwith Dr. Trula Slade (12-18-16 visit)pt HPI, non invasive vascular studies from that day, and exam results; the multiple joint, low back, andradiculopathy type left leg pain is not c/w arterial occlusive disease. She has known arthritis "all over" as she states, likely in her lumber spine. Deferred to her PCP to evaluate and manage this.   Pt is performing daily left leg exercises. She has a personal care aid coming to her house daily.   Her right AKA prosthetic was causing her to fall, was fixed by [facility]. Pt states her prosthetic seems better.    DATA  Carotid Duplex (03-14-18): Bilateral ICA with 1-39% stenosis Left ECA: >50% stenosis Bilateral vertebral artery flow is antegrade.  Right subclavian waveforms are stenotic, left are normal.  No significant change from 11-11-14 and 03-08-16.    ABI (Date: 09/07/2017):  R: AKA  L:  ? ABI: 0.64 (was 0.53 on 12-15-16),  ? PT: mono (was mono) ? DP: mono (was mono) ? TBI: 0.22 (was 0.41) Improvement in left ABI from 53% to 64%, monophasic waveforms remain. Decline in TBI.    Left iliac artery stent duplex (09-07-17): Decreased visualization of the abdominal vasculature due to overlying bowel gas.Evidence suggestive of a patent left CIA stent based on limited visualization with biphasic left iliac waveforms. No significant change in stent velocities in comparison to the last exam on 03-08-16 and 12-15-16.   Plan:  Continue daily seated leg exercises and walking with walker Follow-up in3 monthswith left ABI, see me after. 1 1/2 years with left aortoiliac duplex and Carotid Duplex, see me afterward for discussion of results and evaluation. I advised pt to notify us if she develops concerns re the circulation in her left foot or leg.   I discussed in depth with the patient the nature of atherosclerosis, and emphasized the importance of maximal medical management including  strict control of blood pressure, blood glucose, and lipid levels, obtaining regular exercise, and continued cessation of smoking.  The patient is aware that without maximal medical management the underlying atherosclerotic disease process will progress, limiting the benefit of any interventions.  The patient was given information about PAD including signs, symptoms, treatment, what symptoms should prompt the patient to seek immediate medical care, and risk reduction measures to take.  Vinnie Level  Sydnei Ohaver, RN, MSN, FNP-C [facility] [employer] Phone: [phone]  Clinic MD: [facility]  03/14/18 11:01 AM

## 2018-03-15 ENCOUNTER — Telehealth: Payer: Self-pay | Admitting: Internal Medicine

## 2018-03-15 ENCOUNTER — Telehealth: Payer: Self-pay | Admitting: Medical Oncology

## 2018-03-15 NOTE — Telephone Encounter (Signed)
Appt scheduled tried calling patient but unable to reach her/ Letter/Calendar mailed per 8/16 sch msg

## 2018-03-15 NOTE — Telephone Encounter (Signed)
Pt notified of scan results -no evidence of cancer.

## 2018-03-18 ENCOUNTER — Other Ambulatory Visit: Payer: Self-pay | Admitting: Internal Medicine

## 2018-03-18 ENCOUNTER — Other Ambulatory Visit: Payer: Self-pay

## 2018-03-18 DIAGNOSIS — Z1231 Encounter for screening mammogram for malignant neoplasm of breast: Secondary | ICD-10-CM

## 2018-03-18 DIAGNOSIS — I779 Disorder of arteries and arterioles, unspecified: Secondary | ICD-10-CM

## 2018-03-18 DIAGNOSIS — I739 Peripheral vascular disease, unspecified: Secondary | ICD-10-CM

## 2018-03-18 DIAGNOSIS — I771 Stricture of artery: Secondary | ICD-10-CM

## 2018-03-22 ENCOUNTER — Ambulatory Visit: Payer: Medicare Other | Admitting: Podiatry

## 2018-03-22 ENCOUNTER — Ambulatory Visit (INDEPENDENT_AMBULATORY_CARE_PROVIDER_SITE_OTHER): Payer: Medicare Other | Admitting: Podiatry

## 2018-03-22 ENCOUNTER — Encounter: Payer: Self-pay | Admitting: Podiatry

## 2018-03-22 DIAGNOSIS — M79675 Pain in left toe(s): Secondary | ICD-10-CM

## 2018-03-22 DIAGNOSIS — L84 Corns and callosities: Secondary | ICD-10-CM

## 2018-03-22 DIAGNOSIS — Z89511 Acquired absence of right leg below knee: Secondary | ICD-10-CM | POA: Diagnosis not present

## 2018-03-22 DIAGNOSIS — B351 Tinea unguium: Secondary | ICD-10-CM

## 2018-03-22 DIAGNOSIS — M79674 Pain in right toe(s): Secondary | ICD-10-CM

## 2018-03-22 DIAGNOSIS — I739 Peripheral vascular disease, unspecified: Secondary | ICD-10-CM | POA: Diagnosis not present

## 2018-03-23 NOTE — Progress Notes (Signed)
Subjective: [name] presents today with peripheral arterial disease and cc of painful corns and discolored, thick toenails which interfere with daily activities and routine tasks.  Pain is aggravated when wearing enclosed shoe gear. Pain is getting progressively worse and relieved with periodic professional debridement.  She voices no new concerns on today's visit.  Objective:  Amputation: Right below knee amputation  Vascular Examination: Capillary refill time <3 seconds x 5 digits eft foot Dorsalis pedis pulses absent left  Posterior tibial pulses absent left No digital hair x 5 digits Skin temperature gradient WNL  Dermatological Examination: Skin thin, shiny and atrophic left Toenails 1-5 left discolored, thick, dystrophic with subungual debris and pain with palpation to nailbeds due to thickness of nails.  Hyperkeratotic lesion distal tip left 2nd and left 3rd digits with tenderness to palpation. Hyperkeratotic lesion subhallux IPJ left foot  Musculoskeletal: Muscle strength 5/5 to all LE muscle groups  Neurological: Sensation intact with 10 gram monofilament left foot Vibratory sensation intact left foot  Assessment: 1. Painful onychomycosis toenails 1-5 left 2. Corn(s): distal tip left 2nd, 3rd digits 3. Callus subhallux IPJ left foot 4. Peripheral arterial disease 5. S/p BKA amputation right LE  Plan: 1. Toenails 1-5 left were debrided in length and girth without iatrogenic bleeding. 2. Callus(es) pared: left hallux 3. Corn(s) pared: left 2nd, 3rd digits 4. Patient to continue soft, supportive shoe gear 5. Patient to report any pedal injuries to medical professional  6. Follow up 3 months.  7. Patient/POA to call should there be a concern in the interim.

## 2018-04-03 ENCOUNTER — Inpatient Hospital Stay: Payer: Medicare Other | Attending: Oncology

## 2018-04-03 ENCOUNTER — Inpatient Hospital Stay (HOSPITAL_BASED_OUTPATIENT_CLINIC_OR_DEPARTMENT_OTHER): Payer: Medicare Other | Admitting: Oncology

## 2018-04-03 ENCOUNTER — Telehealth: Payer: Self-pay

## 2018-04-03 VITALS — BP 165/65 | HR 66 | Temp 97.6°F | Resp 19 | Ht 62.0 in | Wt 147.5 lb

## 2018-04-03 DIAGNOSIS — C3432 Malignant neoplasm of lower lobe, left bronchus or lung: Secondary | ICD-10-CM

## 2018-04-03 DIAGNOSIS — Z85118 Personal history of other malignant neoplasm of bronchus and lung: Secondary | ICD-10-CM | POA: Insufficient documentation

## 2018-04-03 DIAGNOSIS — Z923 Personal history of irradiation: Secondary | ICD-10-CM | POA: Insufficient documentation

## 2018-04-03 LAB — CBC WITH DIFFERENTIAL (CANCER CENTER ONLY)
Basophils Absolute: 0 10*3/uL (ref 0.0–0.1)
Basophils Relative: 0 %
Eosinophils Absolute: 0 10*3/uL (ref 0.0–0.5)
Eosinophils Relative: 0 %
HCT: 38.8 % (ref 34.8–46.6)
Hemoglobin: 12.9 g/dL (ref 11.6–15.9)
Lymphocytes Relative: 29 %
Lymphs Abs: 1.4 10*3/uL (ref 0.9–3.3)
MCH: 30.1 pg (ref 25.1–34.0)
MCHC: 33.2 g/dL (ref 31.5–36.0)
MCV: 90.7 fL (ref 79.5–101.0)
Monocytes Absolute: 0.4 10*3/uL (ref 0.1–0.9)
Monocytes Relative: 8 %
Neutro Abs: 3 10*3/uL (ref 1.5–6.5)
Neutrophils Relative %: 63 %
Platelet Count: 274 10*3/uL (ref 145–400)
RBC: 4.28 MIL/uL (ref 3.70–5.45)
RDW: 15 % — ABNORMAL HIGH (ref 11.2–14.5)
WBC Count: 4.9 10*3/uL (ref 3.9–10.3)

## 2018-04-03 LAB — CMP (CANCER CENTER ONLY)
ALT: 9 U/L (ref 0–44)
AST: 15 U/L (ref 15–41)
Albumin: 3.6 g/dL (ref 3.5–5.0)
Alkaline Phosphatase: 62 U/L (ref 38–126)
Anion gap: 10 (ref 5–15)
BUN: 13 mg/dL (ref 8–23)
CO2: 24 mmol/L (ref 22–32)
Calcium: 9.6 mg/dL (ref 8.9–10.3)
Chloride: 107 mmol/L (ref 98–111)
Creatinine: 1.06 mg/dL — ABNORMAL HIGH (ref 0.44–1.00)
GFR, Est AFR Am: 53 mL/min — ABNORMAL LOW (ref >60)
GFR, Estimated: 45 mL/min — ABNORMAL LOW (ref >60)
Glucose, Bld: 89 mg/dL (ref 70–99)
Potassium: 4.3 mmol/L (ref 3.5–5.1)
Sodium: 141 mmol/L (ref 135–145)
Total Bilirubin: 0.4 mg/dL (ref 0.3–1.2)
Total Protein: 6.8 g/dL (ref 6.5–8.1)

## 2018-04-03 NOTE — Progress Notes (Signed)
[facility] OFFICE PROGRESS NOTE  [name], MD [place] [address]  DIAGNOSIS: Stage IB (T2a, N0, M0) non-small cell lung cancer consistent with moderately differentiated adenocarcinoma diagnosed in September of 2014.   PRIOR THERAPY:  1) Bronchoscopy, right video-assisted thoracoscopy, minithoracotomy, right upper lobectomy with lymph node dissection under the care of Dr. Servando Snare on 06/16/2013. Tumor size was 3.5 CM and the myriad genetics showed high risk for recurrence. 2) status post a stereotactic radiotherapy to left lower lobe pulmonary nodule under the care of Dr. Tammi Klippel completed 07/19/2015.  CURRENT THERAPY: Observation.  INTERVAL HISTORY: [name] 84 y.o. female returns for routine follow-up visit by herself.  The patient was due to be seen this past April to review her restaging CT scan of the chest, but did not make this appointment.  The patient denies fevers and chills.  Denies chest pain, shortness of breath, cough, hemoptysis.  Denies nausea, vomiting, constipation, diarrhea.  She is having increased pain secondary to her arthritis.  She is being followed closely by her primary care provider.  She follows up today for evaluation and to review her restaging CT scan that was performed in April 2019.  MEDICAL HISTORY: Past Medical History:  Diagnosis Date  . Arthritis   . Cancer ([place])    rt lung  . COPD GOLD I 04/18/2013  . Glaucoma   . Hyperlipidemia   . Hypertension   . Lung cancer ([place]) 06/07/2013  . Peripheral vascular disease, unspecified ([place])    with Claudication  . S/P radiation therapy 12/12, 12/15, and 07/19/15   The LLL nodule was treated to 54 Gy in 3 fractions of 18 Gy  . Shortness of breath   . Stroke [facility])     ALLERGIES:  is allergic to aspirin and morphine and related.  MEDICATIONS:  Current Outpatient Medications  Medication Sig Dispense Refill  . amLODipine (NORVASC) 2.5 MG tablet Take 2.5 mg by  mouth daily.   3  . amLODipine (NORVASC) 5 MG tablet Take 5 mg by mouth daily.  3  . budesonide-formoterol (SYMBICORT) 160-4.5 MCG/ACT inhaler Inhale 2 puffs into the lungs 2 (two) times daily. 1 Inhaler 0  . Cholecalciferol (VITAMIN D3) 1000 units CAPS Take 1 capsule by mouth daily.    . clopidogrel (PLAVIX) 75 MG tablet Take 1 tablet (75 mg total) by mouth every Monday, Wednesday, and Friday. 90 tablet 3  . docusate sodium (COLACE) 50 MG capsule Take 100 mg by mouth as needed for mild constipation.    . gabapentin (NEURONTIN) 100 MG capsule Take 1 capsule (100 mg total) by mouth 3 (three) times daily. 90 capsule 3  . losartan (COZAAR) 50 MG tablet   3  . meclizine (ANTIVERT) 25 MG tablet Take 25 mg by mouth every 8 (eight) hours as needed for dizziness (dizziness).     Marland Kitchen omeprazole (PRILOSEC) 20 MG capsule   1  . pravastatin (PRAVACHOL) 20 MG tablet   3  . RESTASIS 0.05 % ophthalmic emulsion Place 2 drops into both eyes 2 (two) times daily.     Marland Kitchen tiZANidine (ZANAFLEX) 2 MG tablet   2  . TRAVATAN Z 0.004 % SOLN ophthalmic solution Place 1 drop into both eyes at bedtime.      No current facility-administered medications for this visit.     SURGICAL HISTORY:  Past Surgical History:  Procedure Laterality Date  . ABOVE KNEE LEG AMPUTATION     Right  . ANGIOPLASTY /  STENTING ILIAC  03/22/09   Aortogram-  left common iliac artery  . COLONOSCOPY  Feb. 2013  . HEMORRHOID SURGERY    . TONSILLECTOMY    . VIDEO ASSISTED THORACOSCOPY (VATS)/WEDGE RESECTION Right 06/16/2013   Procedure: VIDEO ASSISTED THORACOSCOPY (VATS)/WEDGE RESECTION;  Surgeon: [name], MD;  Location: [place];  Service: Thoracic;  Laterality: Right;  Marland Kitchen VIDEO BRONCHOSCOPY Bilateral 04/10/2013   Procedure: VIDEO BRONCHOSCOPY WITH FLUORO;  Surgeon: Tanda Rockers, MD;  Location: [facility];  Service: Cardiopulmonary;  Laterality: Bilateral;  . VIDEO BRONCHOSCOPY N/A 06/16/2013   Procedure: VIDEO BRONCHOSCOPY;  Surgeon:  [name], MD;  Location: [facility] OR;  Service: Thoracic;  Laterality: N/A;    REVIEW OF SYSTEMS:   Review of Systems  Constitutional: Negative for appetite change, chills, fatigue, fever and unexpected weight change.  HENT:   Negative for mouth sores, nosebleeds, sore throat and trouble swallowing.   Eyes: Negative for eye problems and icterus.  Respiratory: Negative for cough, hemoptysis, shortness of breath and wheezing.   Cardiovascular: Negative for chest pain and leg swelling.  Gastrointestinal: Negative for abdominal pain, constipation, diarrhea, nausea and vomiting.  Genitourinary: Negative for bladder incontinence, difficulty urinating, dysuria, frequency and hematuria.   Musculoskeletal: Arthralgias secondary to arthritis.  Skin: Negative for itching and rash.  Neurological: Negative for dizziness, extremity weakness, headaches, light-headedness and seizures.  Hematological: Negative for adenopathy. Does not bruise/bleed easily.  Psychiatric/Behavioral: Negative for confusion, depression and sleep disturbance. The patient is not nervous/anxious.     PHYSICAL EXAMINATION:  Blood pressure (!) 165/65, pulse 66, temperature 97.6 F (36.4 C), temperature source Oral, resp. rate 19, height 5' 2"  (1.575 m), weight 147 lb 8 oz (66.9 kg), SpO2 98 %.  ECOG PERFORMANCE STATUS: 1 - Symptomatic but completely ambulatory  Physical Exam  Constitutional: Oriented to person, place, and time and well-developed, well-nourished, and in no distress. No distress.  HENT:  Head: Normocephalic and atraumatic.  Mouth/Throat: Oropharynx is clear and moist. No oropharyngeal exudate.  Eyes: Conjunctivae are normal. Right eye exhibits no discharge. Left eye exhibits no discharge. No scleral icterus.  Neck: Normal range of motion. Neck supple.  Cardiovascular: Normal rate, regular rhythm, normal heart sounds and intact distal pulses.   Pulmonary/Chest: Effort normal and breath sounds normal. No  respiratory distress. No wheezes. No rales.  Abdominal: Soft. Bowel sounds are normal. Exhibits no distension and no mass. There is no tenderness.  Musculoskeletal: Normal range of motion. Exhibits no edema.  Lymphadenopathy:    No cervical adenopathy.  Neurological: Alert and oriented to person, place, and time. Exhibits normal muscle tone. Coordination normal.  Skin: Skin is warm and dry. No rash noted. Not diaphoretic. No erythema. No pallor.  Psychiatric: Mood, memory and judgment normal.  Vitals reviewed.  LABORATORY DATA: Lab Results  Component Value Date   WBC 4.9 04/03/2018   HGB 12.9 04/03/2018   HCT 38.8 04/03/2018   MCV 90.7 04/03/2018   PLT 274 04/03/2018      Chemistry      Component Value Date/Time   NA 141 04/03/2018 1052   NA 141 09/19/2017 1124   NA 143 05/18/2017 0853   K 4.3 04/03/2018 1052   K 4.1 05/18/2017 0853   CL 107 04/03/2018 1052   CO2 24 04/03/2018 1052   CO2 23 05/18/2017 0853   BUN 13 04/03/2018 1052   BUN 15 09/19/2017 1124   BUN 13.7 05/18/2017 0853   CREATININE 1.06 (H) 04/03/2018 1052   CREATININE  0.8 05/18/2017 0853      Component Value Date/Time   CALCIUM 9.6 04/03/2018 1052   CALCIUM 9.3 05/18/2017 0853   ALKPHOS 62 04/03/2018 1052   ALKPHOS 60 05/18/2017 0853   AST 15 04/03/2018 1052   AST 14 05/18/2017 0853   ALT 9 04/03/2018 1052   ALT 8 05/18/2017 0853   BILITOT 0.4 04/03/2018 1052   BILITOT 0.43 05/18/2017 0853       RADIOGRAPHIC STUDIES:  No results found. CLINICAL DATA:  Chest pain, left shoulder pain and congestion for several months. History of lung cancer status post radiation therapy 2016.  EXAM: CT CHEST WITH CONTRAST  TECHNIQUE: Multidetector CT imaging of the chest was performed during intravenous contrast administration.  CONTRAST:  33m OMNIPAQUE IOHEXOL 300 MG/ML  SOLN  COMPARISON:  Chest CT 05/18/2017  FINDINGS: Cardiovascular: The heart is normal in size. No pericardial effusion.  Stable aortic and branch vessel atherosclerotic calcifications. No focal aneurysm or dissection. Stable three-vessel coronary artery calcifications.  Mediastinum/Nodes: No mediastinal or hilar mass or lymphadenopathy. The esophagus is grossly normal.  Lungs/Pleura: Stable surgical changes from a right upper lobe lobectomy. Stable radiation changes involving the left lower lobe. No findings suspicious for recurrent tumor or metastatic disease.  Stable underlying emphysematous changes and pulmonary scarring. No new or worrisome pulmonary lesions. No acute overlying pulmonary process. No pleural effusion.  Upper Abdomen: No significant upper abdominal findings. Advanced atherosclerotic calcifications involving the upper abdominal aorta and branch vessels. Reflux of contrast is noted down the IVC and into the hepatic veins. This could be due to tricuspid regurgitation or right heart failure.  Musculoskeletal: No significant bony findings. Stable degenerative changes involving the thoracic spine.  IMPRESSION: 1. Stable postoperative changes from a right upper lobe lobectomy and stable left lower lobe radiation changes. No findings suspicious for recurrent tumor, mediastinal/hilar adenopathy or metastatic disease. 2. Emphysematous changes and pulmonary scarring but no acute overlying pulmonary process or new worrisome pulmonary lesions. 3. Stable advanced atherosclerotic calcifications involving the aorta and coronary arteries.  Aortic Atherosclerosis (ICD10-I70.0) and Emphysema (ICD10-J43.9).   Electronically Signed   By: PMarijo SanesM.D.   On: 11/19/2017 12:25  ASSESSMENT/PLAN:  Cancer of lower lobe of left lung ([facility] This is a very pleasant 84year old African-American female with history of a stage IB non-small cell lung cancer, adenocarcinoma status post right upper lobectomy with lymph node dissection and has been observation since November 2014. The patient had  a CT scan of the chest performed in April 2019 but missed her follow-up appointment to discuss the results.  She presents today to discuss her CT scan of the chest.  I reviewed with the patient that the CT scan of the chest does not show any evidence of lung cancer recurrence.  However, I discussed with the patient the scan was performed approximately 5 months ago.  Recommend for the patient to continue observation.  I also recommend for the patient had a repeat CT scan of the chest near the end of October which would be 6 months from her last CT scan.  The patient is agreeable to this. The patient will return at the end of October for labs and a CT then follow-up several days later to review the CT scan results.  The patient was advised to call immediately if she has any concerning symptoms in the interval. The patient voices understanding of current disease status and treatment options and is in agreement with the current care plan.  All questions were answered. The patient knows to call the clinic with any problems, questions or concerns. We can certainly see the patient much sooner if necessary.   Orders Placed This Encounter  Procedures  . CT CHEST W CONTRAST    Standing Status:   Future    Standing Expiration Date:   04/04/2019    Order Specific Question:   If indicated for the ordered procedure, I authorize the administration of contrast media per Radiology protocol    Answer:   Yes    Order Specific Question:   Preferred imaging location?    Answer:   [facility]    Order Specific Question:   Radiology Contrast Protocol - do NOT remove file path    Answer:   [email or web address]    Order Specific Question:   ** REASON FOR EXAM (FREE TEXT)    Answer:   Lung cancer. Restaging.  Marland Kitchen CBC with Differential (Cancer Center Only)    Standing Status:   Future    Standing Expiration Date:   04/04/2019  . CMP ([facility] only)    Standing Status:   Future     Standing Expiration Date:   04/04/2019     Mikey Bussing, DNP, AGPCNP-BC, AOCNP 04/03/18

## 2018-04-03 NOTE — Telephone Encounter (Signed)
Printed avs and calender of upcoming appointment. Per 9/4 los. Patient requested date due to am appointment time

## 2018-04-03 NOTE — Assessment & Plan Note (Addendum)
This is a very pleasant 84 year old African-American female with history of a stage IB non-small cell lung cancer, adenocarcinoma status post right upper lobectomy with lymph node dissection and has been observation since November 2014. The patient had a CT scan of the chest performed in April 2019 but missed her follow-up appointment to discuss the results.  She presents today to discuss her CT scan of the chest.  I reviewed with the patient that the CT scan of the chest does not show any evidence of lung cancer recurrence.  However, I discussed with the patient the scan was performed approximately 5 months ago.  Recommend for the patient to continue observation.  I also recommend for the patient had a repeat CT scan of the chest near the end of October which would be 6 months from her last CT scan.  The patient is agreeable to this. The patient will return at the end of October for labs and a CT then follow-up several days later to review the CT scan results.  The patient was advised to call immediately if she has any concerning symptoms in the interval. The patient voices understanding of current disease status and treatment options and is in agreement with the current care plan.  All questions were answered. The patient knows to call the clinic with any problems, questions or concerns. We can certainly see the patient much sooner if necessary.

## 2018-04-29 ENCOUNTER — Ambulatory Visit: Payer: Medicare Other

## 2018-05-16 ENCOUNTER — Inpatient Hospital Stay: Payer: Medicare Other | Attending: Oncology

## 2018-05-16 ENCOUNTER — Telehealth: Payer: Self-pay | Admitting: Medical Oncology

## 2018-05-16 DIAGNOSIS — R0602 Shortness of breath: Secondary | ICD-10-CM | POA: Insufficient documentation

## 2018-05-16 DIAGNOSIS — Z923 Personal history of irradiation: Secondary | ICD-10-CM | POA: Diagnosis not present

## 2018-05-16 DIAGNOSIS — I10 Essential (primary) hypertension: Secondary | ICD-10-CM | POA: Diagnosis not present

## 2018-05-16 DIAGNOSIS — C3432 Malignant neoplasm of lower lobe, left bronchus or lung: Secondary | ICD-10-CM

## 2018-05-16 DIAGNOSIS — Z902 Acquired absence of lung [part of]: Secondary | ICD-10-CM | POA: Diagnosis not present

## 2018-05-16 DIAGNOSIS — Z79899 Other long term (current) drug therapy: Secondary | ICD-10-CM | POA: Diagnosis not present

## 2018-05-16 DIAGNOSIS — Z85118 Personal history of other malignant neoplasm of bronchus and lung: Secondary | ICD-10-CM | POA: Insufficient documentation

## 2018-05-16 LAB — CMP (CANCER CENTER ONLY)
ALT: 10 U/L (ref 0–44)
AST: 17 U/L (ref 15–41)
Albumin: 3.7 g/dL (ref 3.5–5.0)
Alkaline Phosphatase: 68 U/L (ref 38–126)
Anion gap: 13 (ref 5–15)
BUN: 15 mg/dL (ref 8–23)
CO2: 22 mmol/L (ref 22–32)
Calcium: 9.6 mg/dL (ref 8.9–10.3)
Chloride: 104 mmol/L (ref 98–111)
Creatinine: 1.24 mg/dL — ABNORMAL HIGH (ref 0.44–1.00)
GFR, Est AFR Am: 44 mL/min — ABNORMAL LOW (ref >60)
GFR, Estimated: 38 mL/min — ABNORMAL LOW (ref >60)
Glucose, Bld: 92 mg/dL (ref 70–99)
Potassium: 4.2 mmol/L (ref 3.5–5.1)
Sodium: 139 mmol/L (ref 135–145)
Total Bilirubin: 0.7 mg/dL (ref 0.3–1.2)
Total Protein: 7 g/dL (ref 6.5–8.1)

## 2018-05-16 LAB — CBC WITH DIFFERENTIAL (CANCER CENTER ONLY)
Abs Immature Granulocytes: 0.01 10*3/uL (ref 0.00–0.07)
Basophils Absolute: 0 10*3/uL (ref 0.0–0.1)
Basophils Relative: 1 %
Eosinophils Absolute: 0.1 10*3/uL (ref 0.0–0.5)
Eosinophils Relative: 2 %
HCT: 43.4 % (ref 36.0–46.0)
Hemoglobin: 14 g/dL (ref 12.0–15.0)
Immature Granulocytes: 0 %
Lymphocytes Relative: 37 %
Lymphs Abs: 1.5 10*3/uL (ref 0.7–4.0)
MCH: 29.6 pg (ref 26.0–34.0)
MCHC: 32.3 g/dL (ref 30.0–36.0)
MCV: 91.8 fL (ref 80.0–100.0)
Monocytes Absolute: 0.5 10*3/uL (ref 0.1–1.0)
Monocytes Relative: 11 %
Neutro Abs: 2 10*3/uL (ref 1.7–7.7)
Neutrophils Relative %: 49 %
Platelet Count: 246 10*3/uL (ref 150–400)
RBC: 4.73 MIL/uL (ref 3.87–5.11)
RDW: 13.4 % (ref 11.5–15.5)
WBC Count: 4.2 10*3/uL (ref 4.0–10.5)
nRBC: 0 % (ref 0.0–0.2)

## 2018-05-16 NOTE — Telephone Encounter (Signed)
Radiology said pt was not called for scan. I called and got appt for Ct scan tomorrow at 0730 and pt given information and nothing to eat 4 hours prior. Transportation called to pick up pt and given appt for tomorrow.

## 2018-05-17 ENCOUNTER — Ambulatory Visit (HOSPITAL_COMMUNITY): Admission: RE | Admit: 2018-05-17 | Payer: Medicare Other | Source: Ambulatory Visit

## 2018-05-20 ENCOUNTER — Inpatient Hospital Stay (HOSPITAL_BASED_OUTPATIENT_CLINIC_OR_DEPARTMENT_OTHER): Payer: Medicare Other | Admitting: Internal Medicine

## 2018-05-20 ENCOUNTER — Telehealth: Payer: Self-pay

## 2018-05-20 ENCOUNTER — Ambulatory Visit (HOSPITAL_COMMUNITY)
Admission: RE | Admit: 2018-05-20 | Discharge: 2018-05-20 | Disposition: A | Discharge status: Home / Self Care | Payer: Medicare Other | Source: Ambulatory Visit | Attending: Oncology | Admitting: Oncology

## 2018-05-20 ENCOUNTER — Encounter: Payer: Self-pay | Admitting: Internal Medicine

## 2018-05-20 VITALS — BP 139/80 | HR 58 | Temp 97.8°F | Resp 17 | Ht 62.0 in | Wt 146.6 lb

## 2018-05-20 DIAGNOSIS — I2510 Atherosclerotic heart disease of native coronary artery without angina pectoris: Secondary | ICD-10-CM | POA: Diagnosis not present

## 2018-05-20 DIAGNOSIS — Z85118 Personal history of other malignant neoplasm of bronchus and lung: Secondary | ICD-10-CM | POA: Diagnosis not present

## 2018-05-20 DIAGNOSIS — Z79899 Other long term (current) drug therapy: Secondary | ICD-10-CM

## 2018-05-20 DIAGNOSIS — C3490 Malignant neoplasm of unspecified part of unspecified bronchus or lung: Secondary | ICD-10-CM

## 2018-05-20 DIAGNOSIS — J439 Emphysema, unspecified: Secondary | ICD-10-CM | POA: Diagnosis not present

## 2018-05-20 DIAGNOSIS — I700 Atherosclerosis of aorta: Secondary | ICD-10-CM | POA: Diagnosis not present

## 2018-05-20 DIAGNOSIS — I10 Essential (primary) hypertension: Secondary | ICD-10-CM

## 2018-05-20 DIAGNOSIS — Z902 Acquired absence of lung [part of]: Secondary | ICD-10-CM

## 2018-05-20 DIAGNOSIS — C3432 Malignant neoplasm of lower lobe, left bronchus or lung: Secondary | ICD-10-CM

## 2018-05-20 DIAGNOSIS — R0602 Shortness of breath: Secondary | ICD-10-CM

## 2018-05-20 DIAGNOSIS — Z923 Personal history of irradiation: Secondary | ICD-10-CM

## 2018-05-20 NOTE — Progress Notes (Signed)
[facility] Telephone:([phone]   [phone]  OFFICE PROGRESS NOTE  [name], MD [place] [address]  DIAGNOSIS: Stage IB (T2a, N0, M0) non-small cell lung cancer consistent with moderately differentiated adenocarcinoma diagnosed in September of 2014.   PRIOR THERAPY:  1) Bronchoscopy, right video-assisted thoracoscopy, minithoracotomy, right upper lobectomy with lymph node dissection under the care of Dr. Servando Snare on 06/16/2013. Tumor size was 3.5 CM and the myriad genetics showed high risk for recurrence. 2) status post a stereotactic radiotherapy to left lower lobe pulmonary nodule under the care of Dr. Tammi Klippel completed 07/19/2015.  CURRENT THERAPY: Observation.  INTERVAL HISTORY: [name] 84 y.o. female returns to the clinic today for follow-up visit.  The patient is feeling fine today with no specific complaints except for shortness of breath with exertion.  She denied having any chest pain, cough or hemoptysis.  She denied having any fever or chills.  She has no nausea, vomiting, diarrhea or constipation.  She was supposed to have repeat CT scan of the chest before this visit but unfortunately she missed her appointment.   MEDICAL HISTORY: Past Medical History:  Diagnosis Date  . Arthritis   . Cancer ([place])    rt lung  . COPD GOLD I 04/18/2013  . Glaucoma   . Hyperlipidemia   . Hypertension   . Lung cancer ([place]) 06/07/2013  . Peripheral vascular disease, unspecified ([place])    with Claudication  . S/P radiation therapy 12/12, 12/15, and 07/19/15   The LLL nodule was treated to 54 Gy in 3 fractions of 18 Gy  . Shortness of breath   . Stroke [facility])     ALLERGIES:  is allergic to aspirin and morphine and related.  MEDICATIONS:  Current Outpatient Medications  Medication Sig Dispense Refill  . amLODipine (NORVASC) 2.5 MG tablet Take 2.5 mg by mouth daily.   3  . amLODipine (NORVASC) 5 MG tablet Take 5 mg by mouth  daily.  3  . budesonide-formoterol (SYMBICORT) 160-4.5 MCG/ACT inhaler Inhale 2 puffs into the lungs 2 (two) times daily. 1 Inhaler 0  . Cholecalciferol (VITAMIN D3) 1000 units CAPS Take 1 capsule by mouth daily.    . clopidogrel (PLAVIX) 75 MG tablet Take 1 tablet (75 mg total) by mouth every Monday, Wednesday, and Friday. 90 tablet 3  . docusate sodium (COLACE) 50 MG capsule Take 100 mg by mouth as needed for mild constipation.    . gabapentin (NEURONTIN) 100 MG capsule Take 1 capsule (100 mg total) by mouth 3 (three) times daily. 90 capsule 3  . losartan (COZAAR) 50 MG tablet   3  . meclizine (ANTIVERT) 25 MG tablet Take 25 mg by mouth every 8 (eight) hours as needed for dizziness (dizziness).     Marland Kitchen omeprazole (PRILOSEC) 20 MG capsule   1  . pravastatin (PRAVACHOL) 20 MG tablet   3  . RESTASIS 0.05 % ophthalmic emulsion Place 2 drops into both eyes 2 (two) times daily.     Marland Kitchen tiZANidine (ZANAFLEX) 2 MG tablet   2  . TRAVATAN Z 0.004 % SOLN ophthalmic solution Place 1 drop into both eyes at bedtime.      No current facility-administered medications for this visit.     SURGICAL HISTORY:  Past Surgical History:  Procedure Laterality Date  . ABOVE KNEE LEG AMPUTATION     Right  . ANGIOPLASTY / STENTING ILIAC  03/22/09   Aortogram-  left common  iliac artery  . COLONOSCOPY  Feb. 2013  . HEMORRHOID SURGERY    . TONSILLECTOMY    . VIDEO ASSISTED THORACOSCOPY (VATS)/WEDGE RESECTION Right 06/16/2013   Procedure: VIDEO ASSISTED THORACOSCOPY (VATS)/WEDGE RESECTION;  Surgeon: [name], MD;  Location: [place];  Service: Thoracic;  Laterality: Right;  Marland Kitchen VIDEO BRONCHOSCOPY Bilateral 04/10/2013   Procedure: VIDEO BRONCHOSCOPY WITH FLUORO;  Surgeon: Tanda Rockers, MD;  Location[facility];  Service: Cardiopulmonary;  Laterality: Bilateral;  . VIDEO BRONCHOSCOPY N/A 06/16/2013   Procedure: VIDEO BRONCHOSCOPY;  Surgeon: [name], MD;  Location: [facility] OR;  Service: Thoracic;  Laterality:  N/A;    REVIEW OF SYSTEMS:  A comprehensive review of systems was negative except for: Constitutional: positive for fatigue Respiratory: positive for dyspnea on exertion Musculoskeletal: positive for arthralgias   PHYSICAL EXAMINATION: General appearance: alert, cooperative, fatigued and no distress Head: Normocephalic, without obvious abnormality, atraumatic Neck: no adenopathy, no JVD, supple, symmetrical, trachea midline and thyroid not enlarged, symmetric, no tenderness/mass/nodules Lymph nodes: Cervical, supraclavicular, and axillary nodes normal. Resp: clear to auscultation bilaterally Back: symmetric, no curvature. ROM normal. No CVA tenderness. Cardio: regular rate and rhythm, S1, S2 normal, no murmur, click, rub or gallop GI: soft, non-tender; bowel sounds normal; no masses,  no organomegaly Extremities: extremities normal, atraumatic, no cyanosis or edema  ECOG PERFORMANCE STATUS: 1 - Symptomatic but completely ambulatory  Blood pressure 139/80, pulse (!) 58, temperature 97.8 F (36.6 C), temperature source Oral, resp. rate 17, height '5\' 2"'$  (1.575 m), weight 146 lb 9.6 oz (66.5 kg), SpO2 100 %.  LABORATORY DATA: Lab Results  Component Value Date   WBC 4.2 05/16/2018   HGB 14.0 05/16/2018   HCT 43.4 05/16/2018   MCV 91.8 05/16/2018   PLT 246 05/16/2018      Chemistry      Component Value Date/Time   NA 139 05/16/2018 0806   NA 141 09/19/2017 1124   NA 143 05/18/2017 0853   K 4.2 05/16/2018 0806   K 4.1 05/18/2017 0853   CL 104 05/16/2018 0806   CO2 22 05/16/2018 0806   CO2 23 05/18/2017 0853   BUN 15 05/16/2018 0806   BUN 15 09/19/2017 1124   BUN 13.7 05/18/2017 0853   CREATININE 1.24 (H) 05/16/2018 0806   CREATININE 0.8 05/18/2017 0853      Component Value Date/Time   CALCIUM 9.6 05/16/2018 0806   CALCIUM 9.3 05/18/2017 0853   ALKPHOS 68 05/16/2018 0806   ALKPHOS 60 05/18/2017 0853   AST 17 05/16/2018 0806   AST 14 05/18/2017 0853   ALT 10 05/16/2018  0806   ALT 8 05/18/2017 0853   BILITOT 0.7 05/16/2018 0806   BILITOT 0.43 05/18/2017 0853       RADIOGRAPHIC STUDIES: No results found.  ASSESSMENT AND PLAN:  This is a very pleasant 84 years old African-American female with history of a stage IB non-small cell lung cancer, adenocarcinoma status post right upper lobectomy with lymph node dissection and has been observation since November 2014. The patient is feeling fine today with no concerning complaints except for shortness of breath with exertion. I will arrange for the patient to have repeat CT scan of the chest later today for restaging of her disease. If this scan showed no concerning findings for disease recurrence, I will see her back for follow-up visit in 1 year with repeat CT scan of the chest. The patient voices understanding of current disease status and treatment options and is in  agreement with the current care plan.  All questions were answered. The patient knows to call the clinic with any problems, questions or concerns. We can certainly see the patient much sooner if necessary.  Disclaimer: This note was dictated with voice recognition software. Similar sounding words can inadvertently be transcribed and may not be corrected upon review.

## 2018-05-20 NOTE — Telephone Encounter (Signed)
Printed avs and calender of upcoming appointment. Per 10/21 los. Pushed patient to radiology for ct add on for today.

## 2018-05-22 ENCOUNTER — Ambulatory Visit
Admission: RE | Admit: 2018-05-22 | Discharge: 2018-05-22 | Disposition: A | Discharge status: Home / Self Care | Payer: Medicare Other | Source: Ambulatory Visit | Attending: Internal Medicine | Admitting: Internal Medicine

## 2018-05-22 DIAGNOSIS — Z1231 Encounter for screening mammogram for malignant neoplasm of breast: Secondary | ICD-10-CM

## 2018-05-28 ENCOUNTER — Ambulatory Visit (HOSPITAL_COMMUNITY): Payer: Medicare Other

## 2018-05-28 ENCOUNTER — Telehealth: Payer: Self-pay | Admitting: Medical Oncology

## 2018-05-28 NOTE — Telephone Encounter (Signed)
Pt upset that ct scan cancelled today . I returned call to grandaughter to  tell her that pt does not need scan. She has no f/u to review scan. Message to [place].

## 2018-05-28 NOTE — Telephone Encounter (Signed)
She knows if her scan was fine, she will come back for follow-up visit in 6 months with repeat CT scan of the chest.  Both of them were ordered last visit.  She does not need any other scan sooner than 6 months.  Thank you

## 2018-05-29 ENCOUNTER — Other Ambulatory Visit: Payer: Self-pay | Admitting: Medical Oncology

## 2018-05-30 ENCOUNTER — Telehealth: Payer: Self-pay | Admitting: Internal Medicine

## 2018-05-30 NOTE — Telephone Encounter (Signed)
Scheduled appt per 10/30 sch message - sent reminder letter in the mail with appt date and time.

## 2018-06-07 ENCOUNTER — Telehealth: Payer: Self-pay | Admitting: Medical Oncology

## 2018-06-07 NOTE — Telephone Encounter (Signed)
Tried to contact pt again and tell her the ct chest is fine and to see [facility] in 6 months with another scan. Grandaughter notified.

## 2018-06-13 ENCOUNTER — Encounter (HOSPITAL_COMMUNITY): Payer: Medicare Other

## 2018-06-13 ENCOUNTER — Ambulatory Visit: Payer: Medicare Other | Admitting: Family

## 2018-06-21 ENCOUNTER — Ambulatory Visit: Payer: Medicare Other | Admitting: Podiatry

## 2018-09-13 ENCOUNTER — Ambulatory Visit (INDEPENDENT_AMBULATORY_CARE_PROVIDER_SITE_OTHER): Payer: Medicare Other | Admitting: Podiatry

## 2018-09-13 ENCOUNTER — Encounter: Payer: Self-pay | Admitting: Podiatry

## 2018-09-13 ENCOUNTER — Other Ambulatory Visit: Payer: Self-pay

## 2018-09-13 DIAGNOSIS — B351 Tinea unguium: Secondary | ICD-10-CM | POA: Diagnosis not present

## 2018-09-13 DIAGNOSIS — L84 Corns and callosities: Secondary | ICD-10-CM | POA: Diagnosis not present

## 2018-09-13 DIAGNOSIS — M79674 Pain in right toe(s): Secondary | ICD-10-CM | POA: Diagnosis not present

## 2018-09-13 DIAGNOSIS — I739 Peripheral vascular disease, unspecified: Secondary | ICD-10-CM

## 2018-09-13 DIAGNOSIS — Z89511 Acquired absence of right leg below knee: Secondary | ICD-10-CM

## 2018-09-13 DIAGNOSIS — M79675 Pain in left toe(s): Secondary | ICD-10-CM

## 2018-09-13 NOTE — Progress Notes (Signed)
Subjective: [name] is a 85 y.o. y.o. female who presents for preventative foot care today with PAD and cc of painful, discolored, thick toenails and painful callus/corn which interfere with daily activities. Pain is aggravated when wearing enclosed shoe gear. Pain is relieved with periodic professional debridement.  [name], MD is her PCP.   Current Outpatient Medications:  .  amLODipine (NORVASC) 2.5 MG tablet, Take 2.5 mg by mouth daily. , Disp: , Rfl: 3 .  amLODipine (NORVASC) 5 MG tablet, Take 5 mg by mouth daily., Disp: , Rfl: 3 .  budesonide-formoterol (SYMBICORT) 160-4.5 MCG/ACT inhaler, Inhale 2 puffs into the lungs 2 (two) times daily., Disp: 1 Inhaler, Rfl: 0 .  Cholecalciferol (VITAMIN D3) 1000 units CAPS, Take 1 capsule by mouth daily., Disp: , Rfl:  .  clopidogrel (PLAVIX) 75 MG tablet, Take 1 tablet (75 mg total) by mouth every Monday, Wednesday, and Friday., Disp: 90 tablet, Rfl: 3 .  diclofenac sodium (VOLTAREN) 1 % GEL, , Disp: , Rfl:  .  docusate sodium (COLACE) 50 MG capsule, Take 100 mg by mouth as needed for mild constipation., Disp: , Rfl:  .  gabapentin (NEURONTIN) 100 MG capsule, Take 1 capsule (100 mg total) by mouth 3 (three) times daily., Disp: 90 capsule, Rfl: 3 .  losartan (COZAAR) 50 MG tablet, , Disp: , Rfl: 3 .  meclizine (ANTIVERT) 25 MG tablet, Take 25 mg by mouth every 8 (eight) hours as needed for dizziness (dizziness). , Disp: , Rfl:  .  omeprazole (PRILOSEC) 20 MG capsule, , Disp: , Rfl: 1 .  pravastatin (PRAVACHOL) 20 MG tablet, , Disp: , Rfl: 3 .  RESTASIS 0.05 % ophthalmic emulsion, Place 2 drops into both eyes 2 (two) times daily. , Disp: , Rfl:  .  tiZANidine (ZANAFLEX) 2 MG tablet, , Disp: , Rfl: 2 .  TRAVATAN Z 0.004 % SOLN ophthalmic solution, Place 1 drop into both eyes at bedtime. , Disp: , Rfl:   Allergies  Allergen Reactions  . Aspirin Anaphylaxis  . Morphine And Related Other (See Comments)    confusion     Objective: Vascular Examination: Right below knee amputation  Capillary refill time <3 seconds x 5 digits left foot   Dorsalis pedis pulses absent left   Posterior tibial pulses absent left  No digital hair x 5 digits  Skin temperature gradient WNL  Skin temperature warm to cool b/l  Dermatological Examination: Skin thin, shiny and atrophic b/l  Toenails 1-5 b/l discolored, thick, dystrophic with subungual debris and pain with palpation to nailbeds due to thickness of nails.  Hyperkeratotic lesion distal tip left 2nd and left 3rd digits with tenderness to palpation. Hyperkeratotic lesion subhallux IPJ left foot   Musculoskeletal: Muscle strength 5/5 to all LLE muscle groups  Neurological: Sensation intact with 10 gram monofilament.  Vibratory sensation intact   Assessment: 1. Painful onychomycosis toenails 1-5 left foot 1. Callus subhallux IPJ left foot 2. Corn(s): distal tip left 2nd, 3rd digits 3. Peripheral arterial disease 4. S/p BKA amputation right LE  Plan: 1. Discuss diabetic foot care principles. Literature dispensed. 2. Toenails 1-5 b/l were debrided in length and girth without iatrogenic bleeding. 3. Hyperkeratotic lesion(s) pared with sterile chisel blade and gently smoothed with burr distal tip left 2nd,  left 3rd digits and subhallux IPJ left foot 4. Patient to continue soft, supportive shoe gear 5. Patient to report any pedal injuries to medical professional  6. Follow up 3 months.  7. Patient/POA to  call should there be a concern in the interim. 

## 2018-09-13 NOTE — Patient Instructions (Signed)
Onychomycosis/Fungal Toenails  WHAT IS IT? An infection that lies within the keratin of your nail plate that is caused by a fungus.  WHY ME? Fungal infections affect all ages, sexes, races, and creeds.  There may be many factors that predispose you to a fungal infection such as age, coexisting medical conditions such as diabetes, or an autoimmune disease; stress, medications, fatigue, genetics, etc.  Bottom line: fungus thrives in a warm, moist environment and your shoes offer such a location.  IS IT CONTAGIOUS? Theoretically, yes.  You do not want to share shoes, nail clippers or files with someone who has fungal toenails.  Walking around barefoot in the same room or sleeping in the same bed is unlikely to transfer the organism.  It is important to realize, however, that fungus can spread easily from one nail to the next on the same foot.  HOW DO WE TREAT THIS?  There are several ways to treat this condition.  Treatment may depend on many factors such as age, medications, pregnancy, liver and kidney conditions, etc.  It is best to ask your doctor which options are available to you.  1. No treatment.   Unlike many other medical concerns, you can live with this condition.  However for many people this can be a painful condition and may lead to ingrown toenails or a bacterial infection.  It is recommended that you keep the nails cut short to help reduce the amount of fungal nail. 2. Topical treatment.  These range from herbal remedies to prescription strength nail lacquers.  About 40-50% effective, topicals require twice daily application for approximately 9 to 12 months or until an entirely new nail has grown out.  The most effective topicals are medical grade medications available through physicians offices. 3. Oral antifungal medications.  With an 80-90% cure rate, the most common oral medication requires 3 to 4 months of therapy and stays in your system for a year as the new nail grows out.  Oral  antifungal medications do require blood work to make sure it is a safe drug for you.  A liver function panel will be performed prior to starting the medication and after the first month of treatment.  It is important to have the blood work performed to avoid any harmful side effects.  In general, this medication safe but blood work is required. 4. Laser Therapy.  This treatment is performed by applying a specialized laser to the affected nail plate.  This therapy is noninvasive, fast, and non-painful.  It is not covered by insurance and is therefore, out of pocket.  The results have been very good with a 80-95% cure rate.  The [facility] is the only practice in the area to offer this therapy. Permanent Nail Avulsion.  Removing the entire nail so that a new nail will not grow back.Corns and Calluses Corns are small areas of thickened skin that occur on the top, sides, or tip of a toe. They contain a cone-shaped core with a point that can press on a nerve below. This causes pain.  Calluses are areas of thickened skin that can occur anywhere on the body, including the hands, fingers, palms, soles of the feet, and heels. Calluses are usually larger than corns. What are the causes? Corns and calluses are caused by rubbing (friction) or pressure, such as from shoes that are too tight or do not fit properly. What increases the risk? Corns are more likely to develop in people who have misshapen   toes (toe deformities), such as hammer toes. Calluses can occur with friction to any area of the skin. They are more likely to develop in people who:  Work with their hands.  Wear shoes that fit poorly, are too tight, or are high-heeled.  Have toe deformities. What are the signs or symptoms? Symptoms of a corn or callus include:  A hard growth on the skin.  Pain or tenderness under the skin.  Redness and swelling.  Increased discomfort while wearing tight-fitting shoes, if your feet are affected. If a  corn or callus becomes infected, symptoms may include:  Redness and swelling that gets worse.  Pain.  Fluid, blood, or pus draining from the corn or callus. How is this diagnosed? Corns and calluses may be diagnosed based on your symptoms, your medical history, and a physical exam. How is this treated? Treatment for corns and calluses may include:  Removing the cause of the friction or pressure. This may involve: ? Changing your shoes. ? Wearing shoe inserts (orthotics) or other protective layers in your shoes, such as a corn pad. ? Wearing gloves.  Applying medicine to the skin (topical medicine) to help soften skin in the hardened, thickened areas.  Removing layers of dead skin with a file to reduce the size of the corn or callus.  Removing the corn or callus with a scalpel or laser.  Taking antibiotic medicines, if your corn or callus is infected.  Having surgery, if a toe deformity is the cause. Follow these instructions at home:   Take over-the-counter and prescription medicines only as told by your health care provider.  If you were prescribed an antibiotic, take it as told by your health care provider. Do not stop taking it even if your condition starts to improve.  Wear shoes that fit well. Avoid wearing high-heeled shoes and shoes that are too tight or too loose.  Wear any padding, protective layers, gloves, or orthotics as told by your health care provider.  Soak your hands or feet and then use a file or pumice stone to soften your corn or callus. Do this as told by your health care provider.  Check your corn or callus every day for symptoms of infection. Contact a health care provider if you:  Notice that your symptoms do not improve with treatment.  Have redness or swelling that gets worse.  Notice that your corn or callus becomes painful.  Have fluid, blood, or pus coming from your corn or callus.  Have new symptoms. Summary  Corns are small areas of  thickened skin that occur on the top, sides, or tip of a toe.  Calluses are areas of thickened skin that can occur anywhere on the body, including the hands, fingers, palms, and soles of the feet. Calluses are usually larger than corns.  Corns and calluses are caused by rubbing (friction) or pressure, such as from shoes that are too tight or do not fit properly.  Treatment may include wearing any padding, protective layers, gloves, or orthotics as told by your health care provider. This information is not intended to replace advice given to you by your health care provider. Make sure you discuss any questions you have with your health care provider. Document Released: 04/22/2004 Document Revised: 05/30/2017 Document Reviewed: 05/30/2017 Elsevier Interactive Patient Education  2019 [employer]  

## 2018-09-17 NOTE — Progress Notes (Signed)
HISTORY AND PHYSICAL     CC:  Follow up Requesting Provider:  [name], MD  HPI: This is a 85 y.o. female who has hx of left CIA stenting in August 2010.  She has hx of right AKA.  At her last visit, she had c/o pain in her shoulders, phantom pain in the RLE, left leg pain, left flank pain that had been evaluated in the ED.  She did not have non healing wounds on her feet.    She has a hx of lung cancer on the right s/p VATS 2014 as well as rad tx.  She states she has a lung cancer on the left that they are keeping an eye on.    She sees a podiatrist regularly.  The pt is here for follow up.  She states she has some pain in her lower back that goes down in her legs.  She has pain in her left knee.  She states that she has some pain in her left 2nd and 3rd toes after they trimmed her toenails.  She states thatit has gotten better and the sore on the 3nd toe is also improving.  She states she soaks her foot in dial soaks every day.  She is using alcohol on her foot.    She states she has some right sided chest pain that gets better when she rubs on it.  She does have occasional left sided chest pain and is followed by Dr. Terrence Dupont.  She lives alone in independent living and has an aide come in to help her.      The pt is on a statin for cholesterol management.  The pt is not diabetic.   The pt is on ARB and CCB for hypertension.   Tobacco hx:  remote The pt is not on a daily aspirin. (allergy) Other AC:  Plavix   Past Medical History:  Diagnosis Date  . Arthritis   . Cancer ([place])    rt lung  . COPD GOLD I 04/18/2013  . Glaucoma   . Hyperlipidemia   . Hypertension   . Lung cancer ([place]) 06/07/2013  . Peripheral vascular disease, unspecified ([place])    with Claudication  . S/P radiation therapy 12/12, 12/15, and 07/19/15   The LLL nodule was treated to 54 Gy in 3 fractions of 18 Gy  . Shortness of breath   . Stroke [facility])     Past Surgical History:  Procedure Laterality  Date  . ABOVE KNEE LEG AMPUTATION     Right  . ANGIOPLASTY / STENTING ILIAC  03/22/09   Aortogram-  left common iliac artery  . COLONOSCOPY  Feb. 2013  . HEMORRHOID SURGERY    . TONSILLECTOMY    . VIDEO ASSISTED THORACOSCOPY (VATS)/WEDGE RESECTION Right 06/16/2013   Procedure: VIDEO ASSISTED THORACOSCOPY (VATS)/WEDGE RESECTION;  Surgeon: [name], MD;  Location: [facility];  Service: Thoracic;  Laterality: Right;  Marland Kitchen VIDEO BRONCHOSCOPY Bilateral 04/10/2013   Procedure: VIDEO BRONCHOSCOPY WITH FLUORO;  Surgeon: Tanda Rockers, MD;  Location: [facility];  Service: Cardiopulmonary;  Laterality: Bilateral;  . VIDEO BRONCHOSCOPY N/A 06/16/2013   Procedure: VIDEO BRONCHOSCOPY;  Surgeon: [name], MD;  Location: [facility] OR;  Service: Thoracic;  Laterality: N/A;    Allergies  Allergen Reactions  . Aspirin Anaphylaxis  . Morphine And Related Other (See Comments)    confusion    Current Outpatient Medications  Medication Sig Dispense Refill  . amLODipine (NORVASC) 2.5 MG tablet Take  2.5 mg by mouth daily.   3  . amLODipine (NORVASC) 5 MG tablet Take 5 mg by mouth daily.  3  . budesonide-formoterol (SYMBICORT) 160-4.5 MCG/ACT inhaler Inhale 2 puffs into the lungs 2 (two) times daily. 1 Inhaler 0  . Cholecalciferol (VITAMIN D3) 1000 units CAPS Take 1 capsule by mouth daily.    . clopidogrel (PLAVIX) 75 MG tablet Take 1 tablet (75 mg total) by mouth every Monday, Wednesday, and Friday. 90 tablet 3  . diclofenac sodium (VOLTAREN) 1 % GEL     . docusate sodium (COLACE) 50 MG capsule Take 100 mg by mouth as needed for mild constipation.    . gabapentin (NEURONTIN) 100 MG capsule Take 1 capsule (100 mg total) by mouth 3 (three) times daily. 90 capsule 3  . losartan (COZAAR) 50 MG tablet   3  . meclizine (ANTIVERT) 25 MG tablet Take 25 mg by mouth every 8 (eight) hours as needed for dizziness (dizziness).     Marland Kitchen omeprazole (PRILOSEC) 20 MG capsule   1  . pravastatin (PRAVACHOL) 20 MG tablet    3  . RESTASIS 0.05 % ophthalmic emulsion Place 2 drops into both eyes 2 (two) times daily.     Marland Kitchen tiZANidine (ZANAFLEX) 2 MG tablet   2  . TRAVATAN Z 0.004 % SOLN ophthalmic solution Place 1 drop into both eyes at bedtime.      No current facility-administered medications for this visit.     Family History  Problem Relation Age of Onset  . Stomach cancer Mother   . Stroke Brother   . Asthma Sister   . Colon cancer Brother     Social History   Socioeconomic History  . Marital status: Widowed    Spouse name: Not on file  . Number of children: 1  . Years of education: Not on file  . Highest education level: Not on file  Occupational History  . Occupation: Retired  Scientific laboratory technician  . Financial resource strain: Not on file  . Food insecurity:    Worry: Not on file    Inability: Not on file  . Transportation needs:    Medical: Not on file    Non-medical: Not on file  Tobacco Use  . Smoking status: Former Smoker    Packs/day: 0.00    Years: 60.00    Pack years: 0.00    Types: Cigarettes    Last attempt to quit: 07/31/2009    Years since quitting: 9.1  . Smokeless tobacco: Never Used  Substance and Sexual Activity  . Alcohol use: No  . Drug use: No  . Sexual activity: Never  Lifestyle  . Physical activity:    Days per week: Not on file    Minutes per session: Not on file  . Stress: Not on file  Relationships  . Social connections:    Talks on phone: Not on file    Gets together: Not on file    Attends religious service: Not on file    Active member of club or organization: Not on file    Attends meetings of clubs or organizations: Not on file    Relationship status: Not on file  . Intimate partner violence:    Fear of current or ex partner: Not on file    Emotionally abused: Not on file    Physically abused: Not on file    Forced sexual activity: Not on file  Other Topics Concern  . Not on file  Social History  Narrative   Pt lives alone. She is HOH, especially  left ear.     REVIEW OF SYSTEMS:   [X]  denotes positive finding, [ ]  denotes negative finding Cardiac  Comments:  Chest pain or chest pressure: x   Shortness of breath upon exertion: x   Short of breath when lying flat: x   Irregular heart rhythm:        Vascular    Pain in calf, thigh, or hip brought on by ambulation: x   Pain in feet at night that wakes you up from your sleep:  x   Blood clot in your veins:    Leg swelling:  x       Pulmonary    Oxygen at home:    Productive cough:  x   Wheezing:         Neurologic    Sudden weakness in arms or legs:     Sudden numbness in arms or legs:     Sudden onset of difficulty speaking or slurred speech:    Temporary loss of vision in one eye:     Problems with dizziness:         Gastrointestinal    Blood in stool:     Vomited blood:         Genitourinary    Burning when urinating:  x   Blood in urine:        Psychiatric    Major depression:         Hematologic    Bleeding problems:    Problems with blood clotting too easily:        Skin    Rashes or ulcers:        Constitutional    Fever or chills:      PHYSICAL EXAMINATION:  Today's Vitals   09/18/18 1039  BP: (!) 171/71  Pulse: 69  Resp: (!) 22  Temp: 98 F (36.7 C)  TempSrc: Oral  SpO2: 99%  Weight: 148 lb 3.6 oz (67.2 kg)  Height: 5\' 8"  (1.727 m)  PainSc: 8    Body mass index is 22.54 kg/m.   General:  WDWN in NAD; vital signs documented above Gait: Not observed HENT: WNL, normocephalic Pulmonary: normal non-labored breathing , without Rales, rhonchi,  wheezing Cardiac: regular HR, without  Murmurs without carotid bruits Abdomen: soft, NT, no masses Skin: without rashes Vascular Exam/Pulses:  Right Left  Radial 2+ (normal) 2+ (normal)  Femoral Unable to palpate  2+ (normal)  Popliteal Unable to palpate  Unable to palpate   DP Brisk monophasic doppler signal AKA  PT absent AKA   Extremities: 2nd and 3rd left toes with darkened toe  nails; 37mm superficial sore on left plantar surface of the 3rd toe.  Neurologic: A&O X 3;  No focal weakness or paresthesias are detected Psychiatric:  The pt has Normal affect.   Non-Invasive Vascular Imaging:   ABI 2.19.2020 Left:  0.49/0.28 Toe pressure 41  ABI(Date:09/07/2017):  R:AKA  L:  ? ABI:0.64(was 0.53 on 12-15-16),  ? GU:YQIH(KVQ mono) ? QV:ZDGL(OVF mono) ? TBI:0.22 (was 0.41) Improvement in left ABI from 53% to 64%, monophasic waveforms remain. Decline in TBI.  Left iliac artery stent duplex(09-07-17): Decreased visualization of the abdominal vasculature due to overlying bowel gas.Evidence suggestive of a patent left CIA stent based on limited visualization with biphasic left iliac waveforms. No significant change in stent velocities in comparison to the last exam on 8-9-17and 12-15-16.  Carotid Duplex (03-14-18): Bilateral ICA with  1-39% stenosis Left ECA: >50% stenosis Bilateral vertebral artery flow is antegrade.  Right subclavian waveforms are stenotic, left are normal.  No significant change from 11-11-14 and 03-08-16.    ASSESSMENT/PLAN:: 85 y.o. female here for follow up for left CIA stent and ABI's    -pt ABI down from last visit, however about the same as May 2018.  She does have a very small superficial area on the left 3rd toe on the plantar aspect and pain in the 2nd and 3rd toes after they trimmed her toenails.  She tells me this continues to improve as well as the pain.  Discussed with her that if this worsens or the pain increases, to contact us sooner than 3 months when we will see her back with repeat ABI and duplex of left CIA stent.  She will continue with the dial foot soaks.  Discussed with her to not use the alcohol on her foot.  -she states she has had some chest pain and Dr. Terrence Dupont is following this.  She has an appointment in the near future.  She will call him sooner if she has any more chest pain.    Leontine Locket, PA-C [facility] [phone]  Clinic MD:   Oneida Alar

## 2018-09-18 ENCOUNTER — Ambulatory Visit (HOSPITAL_COMMUNITY)
Admission: RE | Admit: 2018-09-18 | Discharge: 2018-09-18 | Disposition: A | Discharge status: Home / Self Care | Payer: Medicare Other | Source: Ambulatory Visit | Attending: Family | Admitting: Family

## 2018-09-18 ENCOUNTER — Ambulatory Visit (INDEPENDENT_AMBULATORY_CARE_PROVIDER_SITE_OTHER): Payer: Medicare Other | Admitting: Physician Assistant

## 2018-09-18 ENCOUNTER — Other Ambulatory Visit: Payer: Self-pay

## 2018-09-18 VITALS — BP 171/71 | HR 69 | Temp 98.0°F | Resp 22 | Ht 68.0 in | Wt 148.2 lb

## 2018-09-18 DIAGNOSIS — Z89611 Acquired absence of right leg above knee: Secondary | ICD-10-CM | POA: Diagnosis not present

## 2018-09-18 DIAGNOSIS — I771 Stricture of artery: Secondary | ICD-10-CM | POA: Diagnosis not present

## 2018-09-18 DIAGNOSIS — I779 Disorder of arteries and arterioles, unspecified: Secondary | ICD-10-CM | POA: Diagnosis not present

## 2018-09-18 DIAGNOSIS — I739 Peripheral vascular disease, unspecified: Secondary | ICD-10-CM

## 2018-11-12 ENCOUNTER — Ambulatory Visit: Payer: Medicare Other | Admitting: Podiatry

## 2018-11-14 ENCOUNTER — Telehealth: Payer: Self-pay | Admitting: Internal Medicine

## 2018-11-14 NOTE — Telephone Encounter (Signed)
Rescheduled 4/29 appt per MD request. Called patient. No answer. Not able to leave message. Will attempt to call again.

## 2018-11-14 NOTE — Telephone Encounter (Signed)
Called patient regarding cancellation and updated appointments, patient did not answer and does not have voicemail set up.

## 2018-11-15 ENCOUNTER — Ambulatory Visit: Payer: Medicare Other | Admitting: Podiatry

## 2018-11-25 ENCOUNTER — Other Ambulatory Visit: Payer: Medicare Other

## 2018-11-26 ENCOUNTER — Other Ambulatory Visit: Payer: Self-pay

## 2018-11-26 DIAGNOSIS — I779 Disorder of arteries and arterioles, unspecified: Secondary | ICD-10-CM

## 2018-11-26 DIAGNOSIS — I771 Stricture of artery: Secondary | ICD-10-CM

## 2018-11-27 ENCOUNTER — Ambulatory Visit: Payer: Medicare Other | Admitting: Internal Medicine

## 2018-12-17 ENCOUNTER — Encounter (HOSPITAL_COMMUNITY): Payer: Medicare Other

## 2018-12-17 ENCOUNTER — Ambulatory Visit: Payer: Medicare Other | Admitting: Family

## 2018-12-19 ENCOUNTER — Inpatient Hospital Stay (HOSPITAL_COMMUNITY): Admission: RE | Admit: 2018-12-19 | Payer: Medicare Other | Source: Ambulatory Visit

## 2018-12-19 ENCOUNTER — Ambulatory Visit: Payer: Medicare Other | Admitting: Family

## 2018-12-19 ENCOUNTER — Encounter (HOSPITAL_COMMUNITY): Payer: Medicare Other

## 2018-12-30 ENCOUNTER — Other Ambulatory Visit: Payer: Self-pay

## 2018-12-30 ENCOUNTER — Inpatient Hospital Stay: Payer: Medicare Other | Attending: Internal Medicine

## 2018-12-30 DIAGNOSIS — Z923 Personal history of irradiation: Secondary | ICD-10-CM | POA: Diagnosis not present

## 2018-12-30 DIAGNOSIS — C3490 Malignant neoplasm of unspecified part of unspecified bronchus or lung: Secondary | ICD-10-CM

## 2018-12-30 DIAGNOSIS — I10 Essential (primary) hypertension: Secondary | ICD-10-CM | POA: Insufficient documentation

## 2018-12-30 DIAGNOSIS — Z85118 Personal history of other malignant neoplasm of bronchus and lung: Secondary | ICD-10-CM | POA: Insufficient documentation

## 2018-12-30 DIAGNOSIS — Z79899 Other long term (current) drug therapy: Secondary | ICD-10-CM | POA: Insufficient documentation

## 2018-12-30 DIAGNOSIS — Z902 Acquired absence of lung [part of]: Secondary | ICD-10-CM | POA: Diagnosis not present

## 2018-12-30 LAB — CBC WITH DIFFERENTIAL (CANCER CENTER ONLY)
Abs Immature Granulocytes: 0.01 10*3/uL (ref 0.00–0.07)
Basophils Absolute: 0 10*3/uL (ref 0.0–0.1)
Basophils Relative: 0 %
Eosinophils Absolute: 0 10*3/uL (ref 0.0–0.5)
Eosinophils Relative: 1 %
HCT: 38.7 % (ref 36.0–46.0)
Hemoglobin: 12.2 g/dL (ref 12.0–15.0)
Immature Granulocytes: 0 %
Lymphocytes Relative: 25 %
Lymphs Abs: 1.2 10*3/uL (ref 0.7–4.0)
MCH: 30.1 pg (ref 26.0–34.0)
MCHC: 31.5 g/dL (ref 30.0–36.0)
MCV: 95.6 fL (ref 80.0–100.0)
Monocytes Absolute: 0.3 10*3/uL (ref 0.1–1.0)
Monocytes Relative: 6 %
Neutro Abs: 3.1 10*3/uL (ref 1.7–7.7)
Neutrophils Relative %: 68 %
Platelet Count: 264 10*3/uL (ref 150–400)
RBC: 4.05 MIL/uL (ref 3.87–5.11)
RDW: 13.6 % (ref 11.5–15.5)
WBC Count: 4.6 10*3/uL (ref 4.0–10.5)
nRBC: 0 % (ref 0.0–0.2)

## 2018-12-30 LAB — CMP (CANCER CENTER ONLY)
ALT: 10 U/L (ref 0–44)
AST: 13 U/L — ABNORMAL LOW (ref 15–41)
Albumin: 3.4 g/dL — ABNORMAL LOW (ref 3.5–5.0)
Alkaline Phosphatase: 51 U/L (ref 38–126)
Anion gap: 8 (ref 5–15)
BUN: 11 mg/dL (ref 8–23)
CO2: 22 mmol/L (ref 22–32)
Calcium: 8.9 mg/dL (ref 8.9–10.3)
Chloride: 110 mmol/L (ref 98–111)
Creatinine: 0.96 mg/dL (ref 0.44–1.00)
GFR, Est AFR Am: >60 mL/min (ref >60)
GFR, Estimated: 52 mL/min — ABNORMAL LOW (ref >60)
Glucose, Bld: 84 mg/dL (ref 70–99)
Potassium: 4.2 mmol/L (ref 3.5–5.1)
Sodium: 140 mmol/L (ref 135–145)
Total Bilirubin: 0.3 mg/dL (ref 0.3–1.2)
Total Protein: 6.5 g/dL (ref 6.5–8.1)

## 2019-01-01 ENCOUNTER — Inpatient Hospital Stay (HOSPITAL_BASED_OUTPATIENT_CLINIC_OR_DEPARTMENT_OTHER): Payer: Medicare Other | Admitting: Internal Medicine

## 2019-01-01 ENCOUNTER — Other Ambulatory Visit: Payer: Medicare Other

## 2019-01-01 ENCOUNTER — Encounter: Payer: Self-pay | Admitting: Internal Medicine

## 2019-01-01 ENCOUNTER — Other Ambulatory Visit: Payer: Self-pay

## 2019-01-01 VITALS — BP 169/54 | HR 64 | Temp 98.3°F | Resp 20 | Ht 68.0 in | Wt 144.4 lb

## 2019-01-01 DIAGNOSIS — I10 Essential (primary) hypertension: Secondary | ICD-10-CM

## 2019-01-01 DIAGNOSIS — Z923 Personal history of irradiation: Secondary | ICD-10-CM

## 2019-01-01 DIAGNOSIS — C3432 Malignant neoplasm of lower lobe, left bronchus or lung: Secondary | ICD-10-CM

## 2019-01-01 DIAGNOSIS — Z902 Acquired absence of lung [part of]: Secondary | ICD-10-CM | POA: Diagnosis not present

## 2019-01-01 DIAGNOSIS — Z79899 Other long term (current) drug therapy: Secondary | ICD-10-CM

## 2019-01-01 DIAGNOSIS — Z85118 Personal history of other malignant neoplasm of bronchus and lung: Secondary | ICD-10-CM

## 2019-01-01 DIAGNOSIS — C3490 Malignant neoplasm of unspecified part of unspecified bronchus or lung: Secondary | ICD-10-CM

## 2019-01-01 NOTE — Progress Notes (Signed)
[name] Telephone:([phone]) [phone]   [phone]  OFFICE PROGRESS NOTE  [name], MD [place] [address]  DIAGNOSIS: Stage IB (T2a, N0, M0) non-small cell lung cancer consistent with moderately differentiated adenocarcinoma diagnosed in September of 2014.   PRIOR THERAPY:  1) Bronchoscopy, right video-assisted thoracoscopy, minithoracotomy, right upper lobectomy with lymph node dissection under the care of Dr. Servando Snare on 06/16/2013. Tumor size was 3.5 CM and the myriad genetics showed high risk for recurrence. 2) status post a stereotactic radiotherapy to left lower lobe pulmonary nodule under the care of Dr. Tammi Klippel completed 07/19/2015.  CURRENT THERAPY: Observation.  INTERVAL HISTORY: [name] 85 y.o. female returns to the clinic today for follow-up visit.  The patient is feeling fine today with no concerning complaints except for shortness of breath with exertion.  The patient denied having any chest pain, cough or hemoptysis.  She denied having any fever or chills.  She has no nausea, vomiting, diarrhea or constipation.  She denied having any headache or visual changes.  She had CT scan of the chest performed in October 2019.  She was supposed to have a scan before her visit today but this was declined by her insurance.  MEDICAL HISTORY: Past Medical History:  Diagnosis Date  . Arthritis   . Cancer ([place])    rt lung  . COPD GOLD I 04/18/2013  . Glaucoma   . Hyperlipidemia   . Hypertension   . Lung cancer ([place]) 06/07/2013  . Peripheral vascular disease, unspecified ([place])    with Claudication  . S/P radiation therapy 12/12, 12/15, and 07/19/15   The LLL nodule was treated to 54 Gy in 3 fractions of 18 Gy  . Shortness of breath   . Stroke [facility])     ALLERGIES:  is allergic to aspirin and morphine and related.  MEDICATIONS:  Current Outpatient Medications  Medication Sig Dispense Refill  . amLODipine (NORVASC) 2.5 MG  tablet Take 2.5 mg by mouth daily.   3  . budesonide-formoterol (SYMBICORT) 160-4.5 MCG/ACT inhaler Inhale 2 puffs into the lungs 2 (two) times daily. 1 Inhaler 0  . Cholecalciferol (VITAMIN D3) 1000 units CAPS Take 1 capsule by mouth daily.    . clopidogrel (PLAVIX) 75 MG tablet Take 1 tablet (75 mg total) by mouth every Monday, Wednesday, and Friday. 90 tablet 3  . diclofenac sodium (VOLTAREN) 1 % GEL     . docusate sodium (COLACE) 50 MG capsule Take 100 mg by mouth as needed for mild constipation.    . gabapentin (NEURONTIN) 100 MG capsule Take 1 capsule (100 mg total) by mouth 3 (three) times daily. 90 capsule 3  . losartan (COZAAR) 50 MG tablet   3  . meclizine (ANTIVERT) 25 MG tablet Take 25 mg by mouth every 8 (eight) hours as needed for dizziness (dizziness).     Marland Kitchen omeprazole (PRILOSEC) 20 MG capsule   1  . pravastatin (PRAVACHOL) 20 MG tablet   3  . RESTASIS 0.05 % ophthalmic emulsion Place 2 drops into both eyes 2 (two) times daily.     Marland Kitchen tiZANidine (ZANAFLEX) 2 MG tablet   2  . TRAVATAN Z 0.004 % SOLN ophthalmic solution Place 1 drop into both eyes at bedtime.      No current facility-administered medications for this visit.     SURGICAL HISTORY:  Past Surgical History:  Procedure Laterality Date  . ABOVE KNEE LEG AMPUTATION  Right  . ANGIOPLASTY / STENTING ILIAC  03/22/09   Aortogram-  left common iliac artery  . COLONOSCOPY  Feb. 2013  . HEMORRHOID SURGERY    . TONSILLECTOMY    . VIDEO ASSISTED THORACOSCOPY (VATS)/WEDGE RESECTION Right 06/16/2013   Procedure: VIDEO ASSISTED THORACOSCOPY (VATS)/WEDGE RESECTION;  Surgeon: [name], MD;  Location: [facility];  Service: Thoracic;  Laterality: Right;  Marland Kitchen VIDEO BRONCHOSCOPY Bilateral 04/10/2013   Procedure: VIDEO BRONCHOSCOPY WITH FLUORO;  Surgeon: Tanda Rockers, MD;  Location: [facility];  Service: Cardiopulmonary;  Laterality: Bilateral;  . VIDEO BRONCHOSCOPY N/A 06/16/2013   Procedure: VIDEO BRONCHOSCOPY;  Surgeon:  [name], MD;  Location: [facility] OR;  Service: Thoracic;  Laterality: N/A;    REVIEW OF SYSTEMS:  A comprehensive review of systems was negative except for: Constitutional: positive for fatigue Respiratory: positive for dyspnea on exertion Musculoskeletal: positive for arthralgias and muscle weakness   PHYSICAL EXAMINATION: General appearance: alert, cooperative, fatigued and no distress Head: Normocephalic, without obvious abnormality, atraumatic Neck: no adenopathy, no JVD, supple, symmetrical, trachea midline and thyroid not enlarged, symmetric, no tenderness/mass/nodules Lymph nodes: Cervical, supraclavicular, and axillary nodes normal. Resp: clear to auscultation bilaterally Back: symmetric, no curvature. ROM normal. No CVA tenderness. Cardio: regular rate and rhythm, S1, S2 normal, no murmur, click, rub or gallop GI: soft, non-tender; bowel sounds normal; no masses,  no organomegaly Extremities: extremities normal, atraumatic, no cyanosis or edema  ECOG PERFORMANCE STATUS: 1 - Symptomatic but completely ambulatory  Blood pressure (!) 169/54, pulse 64, temperature 98.3 F (36.8 C), temperature source Oral, resp. rate 20, height 5' 8"  (1.727 m), weight 144 lb 6.4 oz (65.5 kg), SpO2 100 %.  LABORATORY DATA: Lab Results  Component Value Date   WBC 4.6 12/30/2018   HGB 12.2 12/30/2018   HCT 38.7 12/30/2018   MCV 95.6 12/30/2018   PLT 264 12/30/2018      Chemistry      Component Value Date/Time   NA 140 12/30/2018 1202   NA 141 09/19/2017 1124   NA 143 05/18/2017 0853   K 4.2 12/30/2018 1202   K 4.1 05/18/2017 0853   CL 110 12/30/2018 1202   CO2 22 12/30/2018 1202   CO2 23 05/18/2017 0853   BUN 11 12/30/2018 1202   BUN 15 09/19/2017 1124   BUN 13.7 05/18/2017 0853   CREATININE 0.96 12/30/2018 1202   CREATININE 0.8 05/18/2017 0853      Component Value Date/Time   CALCIUM 8.9 12/30/2018 1202   CALCIUM 9.3 05/18/2017 0853   ALKPHOS 51 12/30/2018 1202   ALKPHOS 60  05/18/2017 0853   AST 13 (L) 12/30/2018 1202   AST 14 05/18/2017 0853   ALT 10 12/30/2018 1202   ALT 8 05/18/2017 0853   BILITOT 0.3 12/30/2018 1202   BILITOT 0.43 05/18/2017 0853       RADIOGRAPHIC STUDIES: No results found.  ASSESSMENT AND PLAN:  This is a very pleasant 85 years old African-American female with history of a stage IB non-small cell lung cancer, adenocarcinoma status post right upper lobectomy with lymph node dissection and has been observation since November 2014. The patient is feeling fine today with no concerning complaints. I recommended for her to continue on observation with repeat CT scan of the chest in 6 months for restaging of her disease. For hypertension she was advised to take her blood pressure medication and to consult with her primary care physician for adjustment of her medication. The patient was advised to  call immediately if she has any concerning symptoms in the interval. The patient voices understanding of current disease status and treatment options and is in agreement with the current care plan.  All questions were answered. The patient knows to call the clinic with any problems, questions or concerns. We can certainly see the patient much sooner if necessary.  Disclaimer: This note was dictated with voice recognition software. Similar sounding words can inadvertently be transcribed and may not be corrected upon review.

## 2019-01-02 ENCOUNTER — Telehealth: Payer: Self-pay | Admitting: Internal Medicine

## 2019-01-02 NOTE — Telephone Encounter (Signed)
Scheduled appt per 6/3 los - mailed letter with appt date and time - Central radiology to contact with ct scan

## 2019-01-16 ENCOUNTER — Ambulatory Visit (INDEPENDENT_AMBULATORY_CARE_PROVIDER_SITE_OTHER): Payer: Medicare Other | Admitting: Family

## 2019-01-16 ENCOUNTER — Encounter: Payer: Self-pay | Admitting: Family

## 2019-01-16 ENCOUNTER — Ambulatory Visit (INDEPENDENT_AMBULATORY_CARE_PROVIDER_SITE_OTHER)
Admission: RE | Admit: 2019-01-16 | Discharge: 2019-01-16 | Disposition: A | Discharge status: Home / Self Care | Payer: Medicare Other | Source: Ambulatory Visit | Attending: Family | Admitting: Family

## 2019-01-16 ENCOUNTER — Other Ambulatory Visit: Payer: Self-pay

## 2019-01-16 ENCOUNTER — Ambulatory Visit (HOSPITAL_COMMUNITY)
Admission: RE | Admit: 2019-01-16 | Discharge: 2019-01-16 | Disposition: A | Discharge status: Home / Self Care | Payer: Medicare Other | Source: Ambulatory Visit | Attending: Family | Admitting: Family

## 2019-01-16 VITALS — BP 148/82 | HR 61 | Temp 97.2°F | Resp 14 | Ht 60.0 in | Wt 145.0 lb

## 2019-01-16 DIAGNOSIS — I779 Disorder of arteries and arterioles, unspecified: Secondary | ICD-10-CM | POA: Insufficient documentation

## 2019-01-16 DIAGNOSIS — I6523 Occlusion and stenosis of bilateral carotid arteries: Secondary | ICD-10-CM

## 2019-01-16 DIAGNOSIS — Z89611 Acquired absence of right leg above knee: Secondary | ICD-10-CM

## 2019-01-16 DIAGNOSIS — I771 Stricture of artery: Secondary | ICD-10-CM | POA: Diagnosis present

## 2019-01-16 DIAGNOSIS — Z95828 Presence of other vascular implants and grafts: Secondary | ICD-10-CM | POA: Diagnosis not present

## 2019-01-16 DIAGNOSIS — Z87891 Personal history of nicotine dependence: Secondary | ICD-10-CM

## 2019-01-16 NOTE — Progress Notes (Signed)
[facility] [place]   CC: Follow up peripheral artery occlusive disease  History of Present Illness [name] is a 85 y.o. female who is s/p left common iliac artery stenting in August of 2010 by Dr. Scot Dock. She has known infrainguinal arterial occlusive disease.  She is status post previous right above-the-knee amputation and is ambulatory with her prosthesis and a cane.  She was last evaluated on 09-18-18. At that time ABI had declined from prior visit, however about the same as May 2018.  She had a very small superficial area on the left 3rd toe on the plantar aspect and pain in the 2nd and 3rd toes after they trimmed her toenails. This continued to improve as well as the pain.  Discussed with her that if this worsens or the pain increases, to contact us sooner than 3 months when we will see her back with repeat ABI and duplex of left CIA stent.  She was to continue with the dial foot soaks.  Discussed with her to not use the alcohol on her foot.  Dr. Terrence Dupont is following her chest pain that has features of GERD.    She denies non healing ulcers on lower extremities.  She had a VAT wedge resection right lung for cancer, November, 2014, states shehadradiation treatments; she also reports it was recently found that she has a tumor in her left lung. 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  Pt states she was told that she has lots of arthritis in her back. She sees a podiatrist every 3-4 months.  Patient states her blood pressure at home [phone], is elevated today. Home Health visits her periodically.  She states she was told that she had 3 TIA's, does not recall any symptoms, this was possibly in 2010.  She seems to have tingling and numbness in both hands, unable to ascertain whether one is worse than the other.  She lives in [place] facility.  [employer] is her prosthetist: [phone], [phone],  [email or web address]  Diabetic: No Tobacco use: former smoker, quit in 2011  Pt meds include: Statin :Yes Betablocker: No ASA: No Other anticoagulants/antiplatelets: Plavix  Past Medical History:  Diagnosis Date  . Arthritis   . Cancer ([place])    rt lung  . COPD GOLD I 04/18/2013  . Glaucoma   . Hyperlipidemia   . Hypertension   . Lung cancer ([place]) 06/07/2013  . Peripheral vascular disease, unspecified ([place])    with Claudication  . S/P radiation therapy 12/12, 12/15, and 07/19/15   The LLL nodule was treated to 54 Gy in 3 fractions of 18 Gy  . Shortness of breath   . Stroke [facility])     Social History Social History   Tobacco Use  . Smoking status: Former Smoker    Packs/day: 0.00    Years: 60.00    Pack years: 0.00    Types: Cigarettes    Quit date: 07/31/2009    Years since quitting: 9.4  . Smokeless tobacco: Never Used  Substance Use Topics  . Alcohol use: No  . Drug use: No    Family History Family History  Problem Relation Age of Onset  . Stomach cancer Mother   . Stroke Brother   . Asthma Sister   . Colon cancer Brother     Past Surgical History:  Procedure Laterality Date  . ABOVE KNEE LEG AMPUTATION     Right  . ANGIOPLASTY / STENTING ILIAC  03/22/09  Aortogram-  left common iliac artery  . COLONOSCOPY  Feb. 2013  . HEMORRHOID SURGERY    . TONSILLECTOMY    . VIDEO ASSISTED THORACOSCOPY (VATS)/WEDGE RESECTION Right 06/16/2013   Procedure: VIDEO ASSISTED THORACOSCOPY (VATS)/WEDGE RESECTION;  Surgeon: [name], MD;  Location: [place];  Service: Thoracic;  Laterality: Right;  Marland Kitchen VIDEO BRONCHOSCOPY Bilateral 04/10/2013   Procedure: VIDEO BRONCHOSCOPY WITH FLUORO;  Surgeon: Tanda Rockers, MD;  [facility];  Service: Cardiopulmonary;  Laterality: Bilateral;  . VIDEO BRONCHOSCOPY N/A 06/16/2013   Procedure: VIDEO BRONCHOSCOPY;  Surgeon: [name], MD;  Location: [facility] OR;  Service: Thoracic;  Laterality: N/A;     Allergies  Allergen Reactions  . Aspirin Anaphylaxis  . Morphine And Related Other (See Comments)    confusion    Current Outpatient Medications  Medication Sig Dispense Refill  . amLODipine (NORVASC) 2.5 MG tablet Take 2.5 mg by mouth daily.   3  . budesonide-formoterol (SYMBICORT) 160-4.5 MCG/ACT inhaler Inhale 2 puffs into the lungs 2 (two) times daily. 1 Inhaler 0  . Cholecalciferol (VITAMIN D3) 1000 units CAPS Take 1 capsule by mouth daily.    . clopidogrel (PLAVIX) 75 MG tablet Take 1 tablet (75 mg total) by mouth every Monday, Wednesday, and Friday. 90 tablet 3  . diclofenac sodium (VOLTAREN) 1 % GEL     . docusate sodium (COLACE) 50 MG capsule Take 100 mg by mouth as needed for mild constipation.    . gabapentin (NEURONTIN) 100 MG capsule Take 1 capsule (100 mg total) by mouth 3 (three) times daily. 90 capsule 3  . losartan (COZAAR) 50 MG tablet   3  . meclizine (ANTIVERT) 25 MG tablet Take 25 mg by mouth every 8 (eight) hours as needed for dizziness (dizziness).     Marland Kitchen omeprazole (PRILOSEC) 20 MG capsule   1  . pravastatin (PRAVACHOL) 20 MG tablet   3  . RESTASIS 0.05 % ophthalmic emulsion Place 2 drops into both eyes 2 (two) times daily.     Marland Kitchen tiZANidine (ZANAFLEX) 2 MG tablet   2  . TRAVATAN Z 0.004 % SOLN ophthalmic solution Place 1 drop into both eyes at bedtime.      No current facility-administered medications for this visit.     ROS: See HPI for pertinent positives and negatives.   Physical Examination  Vitals:   01/16/19 1007  BP: (!) 148/82  Pulse: 61  Resp: 14  Temp: (!) 97.2 F (36.2 C)  TempSrc: Temporal  SpO2: 99%  Weight: 145 lb (65.8 kg)  Height: 5' (1.524 m)   Body mass index is 28.32 kg/m.  General: A&O x 3, WDWN, elderly female. Gait: slow, steady, using right AKA prosthesis and cane.  HENT: No gross abnormalities.  Eyes: Pupils are equal Pulmonary: Respirations are non labored, CTAB, fair air movement in all fields Cardiac: regular  rhythm, no detected murmur.         Carotid Bruits Right Left   Positive Negative   Radial pulses are 1+ palpable bilaterally   Adominal aortic pulse is not palpable                         VASCULAR EXAM: Extremities without ischemic changes, without Gangrene; without open wounds. Right AKA with prosthesis in place.  LE Pulses Right Left       FEMORAL  not palpable seated  not palpable        POPLITEAL  AKA  not palpable       POSTERIOR TIBIAL  AKA   not palpable        DORSALIS PEDIS      ANTERIOR TIBIAL AKA  not palpable    Abdomen: soft, NT, no palpable masses. Skin: no rashes, no cellulitis, no ulcers noted. Musculoskeletal: no muscle wasting or atrophy. See Extremities.  Neurologic: A&O X 3; appropriate affect, Sensation is normal; MOTOR FUNCTION:  moving all extremities equally, motor strength 5/5 throughout. Speech is fluent/normal. CN 2-12 intact except has significant hearing loss. Psychiatric: Thought content is normal, mood appropriate for clinical situation.    ASSESSMENT: [name] is a 85 y.o. female who is s/p left common iliac artery stenting in August of 2010. She has known infrainguinal arterial occlusive disease. She is status post previous right above-the-knee amputation and isambulatory with her prosthesisand walker.  She has no signs of ischemia in her left foot/leg.  I discussedwith Dr. Brabham(12-18-16 visit)pt HPI, non invasive vascular studies from thatday, and exam results; the multiple joint, low back, andradiculopathy type left leg pain is not c/w arterial occlusive disease. She has known arthritis "all over" as she states, likely in her lumber spine.Deferredto her PCP to evaluate and manage this.   Pt is performing daily left leg exercises. She has a personal care aid coming to her house daily.  Her right AKA prosthetic was  causing her to fall, was fixed by [employer] and she now ambulates well with a cane.  Left iliac artery duplex today shows no significant stenosis, but all monophasic waveforms.  Left ABI appears essentially unchanged compared to prior study on 09/18/2018, moderate disease with monophasic waveforms.   DATA  Left Aortoiliac Duplex (01-16-19): Abdominal Aorta Findings: +--------+-------+----------+----------+--------+--------+--------+ LocationAP (cm)Trans (cm)PSV (cm/s)WaveformThrombusComments +--------+-------+----------+----------+--------+--------+--------+ Distal  1.64   1.66      61                                 +--------+-------+----------+----------+--------+--------+--------+   Left Stent(s): +-------------------+--------+---------------+----------+--------+ common iliac arteryPSV cm/sStenosis       Waveform  Comments +-------------------+--------+---------------+----------+--------+ Prox to Stent      64                     monophasic         +-------------------+--------+---------------+----------+--------+ Proximal Stent     156     50-99% stenosismonophasic         +-------------------+--------+---------------+----------+--------+ Mid Stent          133                    monophasic         +-------------------+--------+---------------+----------+--------+ Distal Stent       166                    monophasic         +-------------------+--------+---------------+----------+--------+ Distal to Stent    166                    monophasic         +-------------------+--------+---------------+----------+--------+ >50% proximal stent stenosis by PSV ratio criteria. Summary: Stenosis: +-----------------+---------------+ Location         Stent           +-----------------+---------------+  Left Common Iliac50-99% stenosis +-----------------+---------------+   ABI (Date:  01/16/2019): +---------+------------------+-----+----------+-------+ Left     Lt Pressure (mmHg)IndexWaveform  Comment +---------+------------------+-----+----------+-------+ Brachial 136                                      +---------+------------------+-----+----------+-------+ ATA      77                0.57 monophasic        +---------+------------------+-----+----------+-------+ PTA      71                0.52 monophasic        +---------+------------------+-----+----------+-------+ Great Toe33                0.24                   +---------+------------------+-----+----------+-------+  +-------+-----------+-----------+------------+------------+ ABI/TBIToday's ABIToday's TBIPrevious ABIPrevious TBI +-------+-----------+-----------+------------+------------+ Right  AKA        AKA        AKA         AKA          +-------+-----------+-----------+------------+------------+ Left   0.57       0.24       0.49        0.28         +-------+-----------+-----------+------------+------------+ Left ABIs appear essentially unchanged compared to prior study on 09/18/2018.   Summary: Left: Resting left ankle-brachial index indicates moderate left lower extremity arterial disease. The left toe-brachial index is abnormal. LT Great toe pressure = 33 mmHg.   Carotid Duplex (03-14-18): Bilateral ICA with 1-39% stenosis Left ECA: >50% stenosis Bilateral vertebral artery flow is antegrade.  Right subclavian waveforms are stenotic, left are normal.  No significant change from 11-11-14 and 03-08-16.     PLAN:  Based on the patient's vascular studies and examination, pt will return to clinic in 1 year wirh left aortoiliac duplex, left ABI, and carotid duplex.  I advised pt to notify us if she develops concerns re the circulation in her left foot or leg.  I discussed in depth with the patient the nature of atherosclerosis, and emphasized the importance  of maximal medical management including strict control of blood pressure, blood glucose, and lipid levels, obtaining regular exercise, and continued cessation of smoking.  The patient is aware that without maximal medical management the underlying atherosclerotic disease process will progress, limiting the benefit of any interventions.  The patient was given information about PAD including signs, symptoms, treatment, what symptoms should prompt the patient to seek immediate medical care, and risk reduction measures to take.  Clemon Chambers, RN, MSN, FNP-C [facility] Phone: [phone]  Clinic MD: [facility]  01/16/19 10:42 AM

## 2019-01-16 NOTE — Patient Instructions (Addendum)
Before your next abdominal ultrasound:  Avoid gas forming foods and beverages the day before the test.   Take two Extra-Strength Gas-X capsules at bedtime the night before the test. Take another two Extra-Strength Gas-X capsules in the middle of the night if you get up to the restroom, if not, first thing in the morning with water.  Do not chew gum.      Peripheral Vascular Disease  Peripheral vascular disease (PVD) is a disease of the blood vessels that are not part of your heart and brain. A simple term for PVD is poor circulation. In most cases, PVD narrows the blood vessels that carry blood from your heart to the rest of your body. This can reduce the supply of blood to your arms, legs, and internal organs, like your stomach or kidneys. However, PVD most often affects a person's lower legs and feet. Without treatment, PVD tends to get worse. PVD can also lead to acute ischemic limb. This is when an arm or leg suddenly cannot get enough blood. This is a medical emergency. Follow these instructions at home: Lifestyle  Do not use any products that contain nicotine or tobacco, such as cigarettes and e-cigarettes. If you need help quitting, ask your doctor.  Lose weight if you are overweight. Or, stay at a healthy weight as told by your doctor.  Eat a diet that is low in fat and cholesterol. If you need help, ask your doctor.  Exercise regularly. Ask your doctor for activities that are right for you. General instructions  Take over-the-counter and prescription medicines only as told by your doctor.  Take good care of your feet: ? Wear comfortable shoes that fit well. ? Check your feet often for any cuts or sores.  Keep all follow-up visits as told by your doctor This is important. Contact a doctor if:  You have cramps in your legs when you walk.  You have leg pain when you are at rest.  You have coldness in a leg or foot.  Your skin changes.  You are unable to get or have an  erection (erectile dysfunction).  You have cuts or sores on your feet that do not heal. Get help right away if:  Your arm or leg turns cold, numb, and blue.  Your arms or legs become red, warm, swollen, painful, or numb.  You have chest pain.  You have trouble breathing.  You suddenly have weakness in your face, arm, or leg.  You become very confused or you cannot speak.  You suddenly have a very bad headache.  You suddenly cannot see. Summary  Peripheral vascular disease (PVD) is a disease of the blood vessels.  A simple term for PVD is poor circulation. Without treatment, PVD tends to get worse.  Treatment may include exercise, low fat and low cholesterol diet, and quitting smoking. This information is not intended to replace advice given to you by your health care provider. Make sure you discuss any questions you have with your health care provider. Document Released: 10/11/2009 Document Revised: 08/24/2016 Document Reviewed: 08/24/2016 Elsevier Interactive Patient Education  2019 [employer].

## 2019-04-30 ENCOUNTER — Telehealth: Payer: Self-pay | Admitting: Internal Medicine

## 2019-04-30 NOTE — Telephone Encounter (Signed)
Returned patient's phone call regarding upcoming appointments, patient wanted to confirm date and time.

## 2019-05-19 ENCOUNTER — Inpatient Hospital Stay: Payer: Medicare Other | Attending: Internal Medicine

## 2019-05-19 ENCOUNTER — Other Ambulatory Visit: Payer: Self-pay

## 2019-05-19 DIAGNOSIS — Z85118 Personal history of other malignant neoplasm of bronchus and lung: Secondary | ICD-10-CM | POA: Diagnosis not present

## 2019-05-19 DIAGNOSIS — Z902 Acquired absence of lung [part of]: Secondary | ICD-10-CM | POA: Insufficient documentation

## 2019-05-19 DIAGNOSIS — C3490 Malignant neoplasm of unspecified part of unspecified bronchus or lung: Secondary | ICD-10-CM

## 2019-05-19 DIAGNOSIS — Z923 Personal history of irradiation: Secondary | ICD-10-CM | POA: Insufficient documentation

## 2019-05-19 DIAGNOSIS — I10 Essential (primary) hypertension: Secondary | ICD-10-CM | POA: Insufficient documentation

## 2019-05-19 LAB — CMP (CANCER CENTER ONLY)
ALT: 11 U/L (ref 0–44)
AST: 19 U/L (ref 15–41)
Albumin: 3.7 g/dL (ref 3.5–5.0)
Alkaline Phosphatase: 66 U/L (ref 38–126)
Anion gap: 11 (ref 5–15)
BUN: 16 mg/dL (ref 8–23)
CO2: 24 mmol/L (ref 22–32)
Calcium: 9.5 mg/dL (ref 8.9–10.3)
Chloride: 103 mmol/L (ref 98–111)
Creatinine: 1.23 mg/dL — ABNORMAL HIGH (ref 0.44–1.00)
GFR, Est AFR Am: 45 mL/min — ABNORMAL LOW (ref >60)
GFR, Estimated: 39 mL/min — ABNORMAL LOW (ref >60)
Glucose, Bld: 109 mg/dL — ABNORMAL HIGH (ref 70–99)
Potassium: 4.2 mmol/L (ref 3.5–5.1)
Sodium: 138 mmol/L (ref 135–145)
Total Bilirubin: 0.5 mg/dL (ref 0.3–1.2)
Total Protein: 7.1 g/dL (ref 6.5–8.1)

## 2019-05-19 LAB — CBC WITH DIFFERENTIAL (CANCER CENTER ONLY)
Abs Immature Granulocytes: 0.01 10*3/uL (ref 0.00–0.07)
Basophils Absolute: 0 10*3/uL (ref 0.0–0.1)
Basophils Relative: 1 %
Eosinophils Absolute: 0 10*3/uL (ref 0.0–0.5)
Eosinophils Relative: 0 %
HCT: 43.7 % (ref 36.0–46.0)
Hemoglobin: 14 g/dL (ref 12.0–15.0)
Immature Granulocytes: 0 %
Lymphocytes Relative: 25 %
Lymphs Abs: 1.5 10*3/uL (ref 0.7–4.0)
MCH: 30.4 pg (ref 26.0–34.0)
MCHC: 32 g/dL (ref 30.0–36.0)
MCV: 94.8 fL (ref 80.0–100.0)
Monocytes Absolute: 0.5 10*3/uL (ref 0.1–1.0)
Monocytes Relative: 8 %
Neutro Abs: 4 10*3/uL (ref 1.7–7.7)
Neutrophils Relative %: 66 %
Platelet Count: 299 10*3/uL (ref 150–400)
RBC: 4.61 MIL/uL (ref 3.87–5.11)
RDW: 13.7 % (ref 11.5–15.5)
WBC Count: 6.1 10*3/uL (ref 4.0–10.5)
nRBC: 0 % (ref 0.0–0.2)

## 2019-05-21 ENCOUNTER — Telehealth (HOSPITAL_COMMUNITY): Payer: Self-pay

## 2019-05-21 ENCOUNTER — Inpatient Hospital Stay (HOSPITAL_BASED_OUTPATIENT_CLINIC_OR_DEPARTMENT_OTHER): Payer: Medicare Other | Admitting: Internal Medicine

## 2019-05-21 ENCOUNTER — Encounter: Payer: Self-pay | Admitting: Internal Medicine

## 2019-05-21 ENCOUNTER — Other Ambulatory Visit: Payer: Self-pay

## 2019-05-21 ENCOUNTER — Other Ambulatory Visit: Payer: Self-pay | Admitting: Medical Oncology

## 2019-05-21 VITALS — BP 169/62 | HR 91 | Temp 97.8°F | Resp 20 | Ht 60.0 in | Wt 141.1 lb

## 2019-05-21 DIAGNOSIS — C3490 Malignant neoplasm of unspecified part of unspecified bronchus or lung: Secondary | ICD-10-CM

## 2019-05-21 DIAGNOSIS — C3432 Malignant neoplasm of lower lobe, left bronchus or lung: Secondary | ICD-10-CM

## 2019-05-21 DIAGNOSIS — Z85118 Personal history of other malignant neoplasm of bronchus and lung: Secondary | ICD-10-CM | POA: Diagnosis not present

## 2019-05-21 NOTE — Progress Notes (Signed)
[place] [phone]) [phone]   Fax:([phone]) [phone]  OFFICE PROGRESS NOTE  [name], MD [place] [address]  DIAGNOSIS: Stage IB (T2a, N0, M0) non-small cell lung cancer consistent with moderately differentiated adenocarcinoma diagnosed in September of 2014.   PRIOR THERAPY:  1) Bronchoscopy, right video-assisted thoracoscopy, minithoracotomy, right upper lobectomy with lymph node dissection under the care of Dr. Servando Snare on 06/16/2013. Tumor size was 3.5 CM and the myriad genetics showed high risk for recurrence. 2) status post a stereotactic radiotherapy to left lower lobe pulmonary nodule under the care of Dr. Tammi Klippel completed 07/19/2015.  CURRENT THERAPY: Observation.  INTERVAL HISTORY: [name] 85 y.o. female returns to the clinic today for follow-up visit.  The patient is feeling fine today with no concerning complaints except for shortness of breath with exertion.  She denied having any chest pain, cough or hemoptysis.  She denied having any recent weight loss or night sweats.  She has no nausea, vomiting, diarrhea or constipation.  She was supposed to have repeat CT scan of the chest before her visit today but unfortunately the scan was not scheduled as planned.   MEDICAL HISTORY: Past Medical History:  Diagnosis Date  . Arthritis   . Cancer ([place])    rt lung  . COPD GOLD I 04/18/2013  . Glaucoma   . Hyperlipidemia   . Hypertension   . Lung cancer ([place]) 06/07/2013  . Peripheral vascular disease, unspecified ([place])    with Claudication  . S/P radiation therapy 12/12, 12/15, and 07/19/15   The LLL nodule was treated to 54 Gy in 3 fractions of 18 Gy  . Shortness of breath   . Stroke [facility])     ALLERGIES:  is allergic to aspirin and morphine and related.  MEDICATIONS:  Current Outpatient Medications  Medication Sig Dispense Refill  . amLODipine (NORVASC) 2.5 MG tablet Take 2.5 mg by mouth daily.   3  .  budesonide-formoterol (SYMBICORT) 160-4.5 MCG/ACT inhaler Inhale 2 puffs into the lungs 2 (two) times daily. 1 Inhaler 0  . Cholecalciferol (VITAMIN D3) 1000 units CAPS Take 1 capsule by mouth daily.    . clopidogrel (PLAVIX) 75 MG tablet Take 1 tablet (75 mg total) by mouth every Monday, Wednesday, and Friday. 90 tablet 3  . diclofenac sodium (VOLTAREN) 1 % GEL     . docusate sodium (COLACE) 50 MG capsule Take 100 mg by mouth as needed for mild constipation.    . gabapentin (NEURONTIN) 100 MG capsule Take 1 capsule (100 mg total) by mouth 3 (three) times daily. 90 capsule 3  . losartan (COZAAR) 50 MG tablet   3  . meclizine (ANTIVERT) 25 MG tablet Take 25 mg by mouth every 8 (eight) hours as needed for dizziness (dizziness).     Marland Kitchen omeprazole (PRILOSEC) 20 MG capsule   1  . pravastatin (PRAVACHOL) 20 MG tablet   3  . RESTASIS 0.05 % ophthalmic emulsion Place 2 drops into both eyes 2 (two) times daily.     Marland Kitchen tiZANidine (ZANAFLEX) 2 MG tablet   2  . TRAVATAN Z 0.004 % SOLN ophthalmic solution Place 1 drop into both eyes at bedtime.      No current facility-administered medications for this visit.     SURGICAL HISTORY:  Past Surgical History:  Procedure Laterality Date  . ABOVE KNEE LEG AMPUTATION     Right  . ANGIOPLASTY / STENTING ILIAC  03/22/09   Aortogram-  left common iliac artery  . COLONOSCOPY  Feb. 2013  . HEMORRHOID SURGERY    . TONSILLECTOMY    . VIDEO ASSISTED THORACOSCOPY (VATS)/WEDGE RESECTION Right 06/16/2013   Procedure: VIDEO ASSISTED THORACOSCOPY (VATS)/WEDGE RESECTION;  Surgeon: [name], MD;  Location: [place];  Service: Thoracic;  Laterality: Right;  Marland Kitchen VIDEO BRONCHOSCOPY Bilateral 04/10/2013   Procedure: VIDEO BRONCHOSCOPY WITH FLUORO;  Surgeon: Tanda Rockers, MD;  Location: [facility];  Service: Cardiopulmonary;  Laterality: Bilateral;  . VIDEO BRONCHOSCOPY N/A 06/16/2013   Procedure: VIDEO BRONCHOSCOPY;  Surgeon: [name], MD;  Location: [facility] OR;   Service: Thoracic;  Laterality: N/A;    REVIEW OF SYSTEMS:  A comprehensive review of systems was negative except for: Respiratory: positive for dyspnea on exertion   PHYSICAL EXAMINATION: General appearance: alert, cooperative and no distress Head: Normocephalic, without obvious abnormality, atraumatic Neck: no adenopathy, no JVD, supple, symmetrical, trachea midline and thyroid not enlarged, symmetric, no tenderness/mass/nodules Lymph nodes: Cervical, supraclavicular, and axillary nodes normal. Resp: clear to auscultation bilaterally Back: symmetric, no curvature. ROM normal. No CVA tenderness. Cardio: regular rate and rhythm, S1, S2 normal, no murmur, click, rub or gallop GI: soft, non-tender; bowel sounds normal; no masses,  no organomegaly Extremities: extremities normal, atraumatic, no cyanosis or edema  ECOG PERFORMANCE STATUS: 1 - Symptomatic but completely ambulatory  Blood pressure (!) 169/62, pulse 91, temperature 97.8 F (36.6 C), temperature source Temporal, resp. rate 20, height 5' (1.524 m), weight 141 lb 1.6 oz (64 kg), SpO2 98 %.  LABORATORY DATA: Lab Results  Component Value Date   WBC 6.1 05/19/2019   HGB 14.0 05/19/2019   HCT 43.7 05/19/2019   MCV 94.8 05/19/2019   PLT 299 05/19/2019      Chemistry      Component Value Date/Time   NA 138 05/19/2019 0921   NA 141 09/19/2017 1124   NA 143 05/18/2017 0853   K 4.2 05/19/2019 0921   K 4.1 05/18/2017 0853   CL 103 05/19/2019 0921   CO2 24 05/19/2019 0921   CO2 23 05/18/2017 0853   BUN 16 05/19/2019 0921   BUN 15 09/19/2017 1124   BUN 13.7 05/18/2017 0853   CREATININE 1.23 (H) 05/19/2019 0921   CREATININE 0.8 05/18/2017 0853      Component Value Date/Time   CALCIUM 9.5 05/19/2019 0921   CALCIUM 9.3 05/18/2017 0853   ALKPHOS 66 05/19/2019 0921   ALKPHOS 60 05/18/2017 0853   AST 19 05/19/2019 0921   AST 14 05/18/2017 0853   ALT 11 05/19/2019 0921   ALT 8 05/18/2017 0853   BILITOT 0.5 05/19/2019 0921    BILITOT 0.43 05/18/2017 0853       RADIOGRAPHIC STUDIES: No results found.  ASSESSMENT AND PLAN:  This is a very pleasant 85 years old African-American female with history of a stage IB non-small cell lung cancer, adenocarcinoma status post right upper lobectomy with lymph node dissection and has been observation since November 2014. The patient is currently on observation and she is feeling fine. I recommended for her to have repeat CT scan of the chest performed in the next few days. If the scan showed no concerning findings for disease recurrence, I will see her back for follow-up visit in 1 year with repeat CT scan of the chest. For the hypertension, I strongly recommended for her to take her blood pressure medication as prescribed and to monitor it closely at home. The patient was advised to call immediately if  she has any concerning symptoms in the interval. The patient voices understanding of current disease status and treatment options and is in agreement with the current care plan.  All questions were answered. The patient knows to call the clinic with any problems, questions or concerns. We can certainly see the patient much sooner if necessary.  Disclaimer: This note was dictated with voice recognition software. Similar sounding words can inadvertently be transcribed and may not be corrected upon review.

## 2019-05-21 NOTE — Telephone Encounter (Signed)
Pt needs transportation to CT scan.Message sent to [facility] transportation to get pt to CT scan on 10/26. Pt notified to expect a call aobut the ride.

## 2019-05-22 ENCOUNTER — Telehealth: Payer: Self-pay | Admitting: Internal Medicine

## 2019-05-22 NOTE — Telephone Encounter (Signed)
Scheduled appt per 10/21 los.  Was not able to reach the pt...sent a staff message to get a calendar mailed out.

## 2019-05-26 ENCOUNTER — Ambulatory Visit (HOSPITAL_COMMUNITY)
Admission: RE | Admit: 2019-05-26 | Discharge: 2019-05-26 | Disposition: A | Discharge status: Home / Self Care | Payer: Medicare Other | Source: Ambulatory Visit | Attending: Internal Medicine | Admitting: Internal Medicine

## 2019-05-26 ENCOUNTER — Encounter (HOSPITAL_COMMUNITY): Payer: Self-pay

## 2019-05-26 ENCOUNTER — Other Ambulatory Visit: Payer: Self-pay

## 2019-05-26 ENCOUNTER — Telehealth: Payer: Self-pay | Admitting: Medical Oncology

## 2019-05-26 DIAGNOSIS — C3490 Malignant neoplasm of unspecified part of unspecified bronchus or lung: Secondary | ICD-10-CM | POA: Insufficient documentation

## 2019-05-26 MED ORDER — IOHEXOL 300 MG/ML  SOLN
75.0000 mL | Freq: Once | INTRAMUSCULAR | Status: AC | PRN
  Administered 2019-05-26: 60 mL via INTRAVENOUS

## 2019-05-26 MED ORDER — SODIUM CHLORIDE (PF) 0.9 % IJ SOLN
INTRAMUSCULAR | Status: AC
  Filled 2019-05-26: qty 50

## 2019-05-26 NOTE — Telephone Encounter (Signed)
No answer

## 2019-05-26 NOTE — Telephone Encounter (Signed)
Curt Bears, MD  Ardeen Garland, RN        Please let the patient know that her scan is fine. Follow-up in 1 year. Thank you.    Pt notified of Mohamed's message.

## 2019-07-04 ENCOUNTER — Inpatient Hospital Stay: Payer: Medicare Other | Attending: Internal Medicine

## 2019-07-04 ENCOUNTER — Ambulatory Visit (HOSPITAL_COMMUNITY)
Admission: RE | Admit: 2019-07-04 | Discharge: 2019-07-04 | Disposition: A | Discharge status: Home / Self Care | Payer: Medicare Other | Source: Ambulatory Visit | Attending: Internal Medicine | Admitting: Internal Medicine

## 2019-07-04 ENCOUNTER — Other Ambulatory Visit: Payer: Self-pay

## 2019-07-04 ENCOUNTER — Encounter (HOSPITAL_COMMUNITY): Payer: Self-pay

## 2019-07-04 DIAGNOSIS — Z902 Acquired absence of lung [part of]: Secondary | ICD-10-CM | POA: Diagnosis not present

## 2019-07-04 DIAGNOSIS — C3490 Malignant neoplasm of unspecified part of unspecified bronchus or lung: Secondary | ICD-10-CM

## 2019-07-04 DIAGNOSIS — I10 Essential (primary) hypertension: Secondary | ICD-10-CM | POA: Insufficient documentation

## 2019-07-04 DIAGNOSIS — Z85118 Personal history of other malignant neoplasm of bronchus and lung: Secondary | ICD-10-CM | POA: Insufficient documentation

## 2019-07-04 DIAGNOSIS — Z923 Personal history of irradiation: Secondary | ICD-10-CM | POA: Insufficient documentation

## 2019-07-04 LAB — CMP (CANCER CENTER ONLY)
ALT: 9 U/L (ref 0–44)
AST: 17 U/L (ref 15–41)
Albumin: 3.6 g/dL (ref 3.5–5.0)
Alkaline Phosphatase: 56 U/L (ref 38–126)
Anion gap: 10 (ref 5–15)
BUN: 20 mg/dL (ref 8–23)
CO2: 22 mmol/L (ref 22–32)
Calcium: 9.1 mg/dL (ref 8.9–10.3)
Chloride: 106 mmol/L (ref 98–111)
Creatinine: 1.27 mg/dL — ABNORMAL HIGH (ref 0.44–1.00)
GFR, Est AFR Am: 43 mL/min — ABNORMAL LOW (ref >60)
GFR, Estimated: 37 mL/min — ABNORMAL LOW (ref >60)
Glucose, Bld: 100 mg/dL — ABNORMAL HIGH (ref 70–99)
Potassium: 4.8 mmol/L (ref 3.5–5.1)
Sodium: 138 mmol/L (ref 135–145)
Total Bilirubin: 0.4 mg/dL (ref 0.3–1.2)
Total Protein: 6.7 g/dL (ref 6.5–8.1)

## 2019-07-04 LAB — CBC WITH DIFFERENTIAL (CANCER CENTER ONLY)
Abs Immature Granulocytes: 0.01 10*3/uL (ref 0.00–0.07)
Basophils Absolute: 0 10*3/uL (ref 0.0–0.1)
Basophils Relative: 1 %
Eosinophils Absolute: 0 10*3/uL (ref 0.0–0.5)
Eosinophils Relative: 1 %
HCT: 43 % (ref 36.0–46.0)
Hemoglobin: 13.7 g/dL (ref 12.0–15.0)
Immature Granulocytes: 0 %
Lymphocytes Relative: 31 %
Lymphs Abs: 1.4 10*3/uL (ref 0.7–4.0)
MCH: 30.4 pg (ref 26.0–34.0)
MCHC: 31.9 g/dL (ref 30.0–36.0)
MCV: 95.3 fL (ref 80.0–100.0)
Monocytes Absolute: 0.4 10*3/uL (ref 0.1–1.0)
Monocytes Relative: 9 %
Neutro Abs: 2.7 10*3/uL (ref 1.7–7.7)
Neutrophils Relative %: 58 %
Platelet Count: 272 10*3/uL (ref 150–400)
RBC: 4.51 MIL/uL (ref 3.87–5.11)
RDW: 13.5 % (ref 11.5–15.5)
WBC Count: 4.7 10*3/uL (ref 4.0–10.5)
nRBC: 0 % (ref 0.0–0.2)

## 2019-07-04 MED ORDER — IOHEXOL 300 MG/ML  SOLN
60.0000 mL | Freq: Once | INTRAMUSCULAR | Status: AC | PRN
  Administered 2019-07-04: 75 mL via INTRAVENOUS

## 2019-07-04 MED ORDER — SODIUM CHLORIDE (PF) 0.9 % IJ SOLN
INTRAMUSCULAR | Status: AC
  Filled 2019-07-04: qty 50

## 2019-07-07 ENCOUNTER — Inpatient Hospital Stay (HOSPITAL_BASED_OUTPATIENT_CLINIC_OR_DEPARTMENT_OTHER): Payer: Medicare Other | Admitting: Internal Medicine

## 2019-07-07 ENCOUNTER — Encounter: Payer: Self-pay | Admitting: Internal Medicine

## 2019-07-07 ENCOUNTER — Other Ambulatory Visit: Payer: Self-pay

## 2019-07-07 DIAGNOSIS — C3490 Malignant neoplasm of unspecified part of unspecified bronchus or lung: Secondary | ICD-10-CM | POA: Diagnosis not present

## 2019-07-07 DIAGNOSIS — Z85118 Personal history of other malignant neoplasm of bronchus and lung: Secondary | ICD-10-CM | POA: Diagnosis not present

## 2019-07-07 NOTE — Progress Notes (Signed)
[place] Telephone:([phone]   Fax:(336) [phone]  OFFICE PROGRESS NOTE  [name], MD [place] [address]  DIAGNOSIS: Stage IB (T2a, N0, M0) non-small cell lung cancer consistent with moderately differentiated adenocarcinoma diagnosed in September of 2014.   PRIOR THERAPY:  1) Bronchoscopy, right video-assisted thoracoscopy, minithoracotomy, right upper lobectomy with lymph node dissection under the care of Dr. Servando Snare on 06/16/2013. Tumor size was 3.5 CM and the myriad genetics showed high risk for recurrence. 2) status post a stereotactic radiotherapy to left lower lobe pulmonary nodule under the care of Dr. Tammi Klippel completed 07/19/2015.  CURRENT THERAPY: Observation.  INTERVAL HISTORY: [name] 85 y.o. female returns to the clinic today for follow-up visit.  The patient is feeling fine today with no concerning complaints.  She denied having any current chest pain, shortness of breath, cough or hemoptysis.  She continues to have mild fatigue and aching pain.  She denied having any nausea, vomiting, diarrhea or constipation.  She has no headache or visual changes.  She is here today for reevaluation and repeat CT scan of the chest which was a schedule by mistake.   MEDICAL HISTORY: Past Medical History:  Diagnosis Date  . Arthritis   . Cancer ([place])    rt lung  . COPD GOLD I 04/18/2013  . Glaucoma   . Hyperlipidemia   . Hypertension   . Lung cancer ([place]) 06/07/2013  . Peripheral vascular disease, unspecified ([place])    with Claudication  . S/P radiation therapy 12/12, 12/15, and 07/19/15   The LLL nodule was treated to 54 Gy in 3 fractions of 18 Gy  . Shortness of breath   . Stroke [facility])     ALLERGIES:  is allergic to aspirin and morphine and related.  MEDICATIONS:  Current Outpatient Medications  Medication Sig Dispense Refill  . amLODipine (NORVASC) 2.5 MG tablet Take 2.5 mg by mouth daily.   3  .  budesonide-formoterol (SYMBICORT) 160-4.5 MCG/ACT inhaler Inhale 2 puffs into the lungs 2 (two) times daily. 1 Inhaler 0  . Cholecalciferol (VITAMIN D3) 1000 units CAPS Take 1 capsule by mouth daily.    . clopidogrel (PLAVIX) 75 MG tablet Take 1 tablet (75 mg total) by mouth every Monday, Wednesday, and Friday. 90 tablet 3  . diclofenac sodium (VOLTAREN) 1 % GEL     . docusate sodium (COLACE) 50 MG capsule Take 100 mg by mouth as needed for mild constipation.    . gabapentin (NEURONTIN) 100 MG capsule Take 1 capsule (100 mg total) by mouth 3 (three) times daily. 90 capsule 3  . losartan (COZAAR) 50 MG tablet   3  . meclizine (ANTIVERT) 25 MG tablet Take 25 mg by mouth every 8 (eight) hours as needed for dizziness (dizziness).     Marland Kitchen omeprazole (PRILOSEC) 20 MG capsule   1  . pravastatin (PRAVACHOL) 20 MG tablet   3  . RESTASIS 0.05 % ophthalmic emulsion Place 2 drops into both eyes 2 (two) times daily.     Marland Kitchen tiZANidine (ZANAFLEX) 2 MG tablet   2  . TRAVATAN Z 0.004 % SOLN ophthalmic solution Place 1 drop into both eyes at bedtime.      No current facility-administered medications for this visit.     SURGICAL HISTORY:  Past Surgical History:  Procedure Laterality Date  . ABOVE KNEE LEG AMPUTATION     Right  . ANGIOPLASTY / STENTING ILIAC  03/22/09   Aortogram-  left common iliac artery  . COLONOSCOPY  Feb. 2013  . HEMORRHOID SURGERY    . TONSILLECTOMY    . VIDEO ASSISTED THORACOSCOPY (VATS)/WEDGE RESECTION Right 06/16/2013   Procedure: VIDEO ASSISTED THORACOSCOPY (VATS)/WEDGE RESECTION;  Surgeon: [name], MD;  Location: [place];  Service: Thoracic;  Laterality: Right;  Marland Kitchen VIDEO BRONCHOSCOPY Bilateral 04/10/2013   Procedure: VIDEO BRONCHOSCOPY WITH FLUORO;  Surgeon: Tanda Rockers, MD;  [facility];  Service: Cardiopulmonary;  Laterality: Bilateral;  . VIDEO BRONCHOSCOPY N/A 06/16/2013   Procedure: VIDEO BRONCHOSCOPY;  Surgeon: [name], MD;  Location: [facility] OR;   Service: Thoracic;  Laterality: N/A;    REVIEW OF SYSTEMS:  A comprehensive review of systems was negative except for: Respiratory: positive for dyspnea on exertion   PHYSICAL EXAMINATION: General appearance: alert, cooperative and no distress Head: Normocephalic, without obvious abnormality, atraumatic Neck: no adenopathy, no JVD, supple, symmetrical, trachea midline and thyroid not enlarged, symmetric, no tenderness/mass/nodules Lymph nodes: Cervical, supraclavicular, and axillary nodes normal. Resp: clear to auscultation bilaterally Back: symmetric, no curvature. ROM normal. No CVA tenderness. Cardio: regular rate and rhythm, S1, S2 normal, no murmur, click, rub or gallop GI: soft, non-tender; bowel sounds normal; no masses,  no organomegaly Extremities: extremities normal, atraumatic, no cyanosis or edema  ECOG PERFORMANCE STATUS: 1 - Symptomatic but completely ambulatory  Blood pressure (!) 153/87, pulse 88, temperature 98.2 F (36.8 C), temperature source Oral, resp. rate 20, height 5' (1.524 m), weight 141 lb 4.8 oz (64.1 kg), SpO2 100 %.  LABORATORY DATA: Lab Results  Component Value Date   WBC 4.7 07/04/2019   HGB 13.7 07/04/2019   HCT 43.0 07/04/2019   MCV 95.3 07/04/2019   PLT 272 07/04/2019      Chemistry      Component Value Date/Time   NA 138 07/04/2019 0941   NA 141 09/19/2017 1124   NA 143 05/18/2017 0853   K 4.8 07/04/2019 0941   K 4.1 05/18/2017 0853   CL 106 07/04/2019 0941   CO2 22 07/04/2019 0941   CO2 23 05/18/2017 0853   BUN 20 07/04/2019 0941   BUN 15 09/19/2017 1124   BUN 13.7 05/18/2017 0853   CREATININE 1.27 (H) 07/04/2019 0941   CREATININE 0.8 05/18/2017 0853      Component Value Date/Time   CALCIUM 9.1 07/04/2019 0941   CALCIUM 9.3 05/18/2017 0853   ALKPHOS 56 07/04/2019 0941   ALKPHOS 60 05/18/2017 0853   AST 17 07/04/2019 0941   AST 14 05/18/2017 0853   ALT 9 07/04/2019 0941   ALT 8 05/18/2017 0853   BILITOT 0.4 07/04/2019 0941    BILITOT 0.43 05/18/2017 0853       RADIOGRAPHIC STUDIES: Ct Chest W Contrast  Result Date: 07/04/2019 CLINICAL DATA:  Lung cancer. EXAM: CT CHEST WITH CONTRAST TECHNIQUE: Multidetector CT imaging of the chest was performed following the standard protocol during bolus administration of intravenous contrast. CONTRAST:  31m OMNIPAQUE IOHEXOL 300 MG/ML  SOLN COMPARISON:  05/26/2019 FINDINGS: CT CHEST FINDINGS Cardiovascular: Heart size upper normal. No substantial pericardial effusion. Coronary artery calcification is evident. Atherosclerotic calcification is noted in the wall of the thoracic aorta. Mediastinum/Nodes: No mediastinal lymphadenopathy. There is no hilar lymphadenopathy. The esophagus has normal imaging features. There is no axillary lymphadenopathy. Lungs/Pleura: Centrilobular and paraseptal emphysema evident. Volume loss right hemithorax is stable compatible with prior right upper lobectomy. Surgical changes evident in the right hilum. No new suspicious pulmonary nodule or mass. No pleural effusion.  Musculoskeletal: No worrisome lytic or sclerotic osseous abnormality. Upper abdomen: Images of the upper abdomen are unremarkable. The hypoattenuating liver lesion described on the previous study is not been included on today's exam. 8 mm low-density lesion upper pole left kidney is stable. IMPRESSION: 1. Stable exam. No new or progressive findings in the chest to suggest recurrent or metastatic disease. 2.  Emphysema. (ICD10-J43.9) 3.  Aortic Atherosclerois (ICD10-170.0) Electronically Signed   By: Misty Stanley M.D.   On: 07/04/2019 12:56    ASSESSMENT AND PLAN:  This is a very pleasant 85 years old African-American female with history of a stage IB non-small cell lung cancer, adenocarcinoma status post right upper lobectomy with lymph node dissection and has been observation since November 2014. She is currently on observation and feeling well with no concerning complaints. Repeat CT scan of  the chest performed recently showed no concerning findings for disease recurrence or progression. For the hypertension, I strongly recommended for her to take her blood pressure medication as prescribed and to monitor it closely at home. I recommended for the patient to continue on observation with repeat CT scan of the chest in 1 year. She was advised to call immediately if she has any concerning symptoms in the interval. The patient voices understanding of current disease status and treatment options and is in agreement with the current care plan.  All questions were answered. The patient knows to call the clinic with any problems, questions or concerns. We can certainly see the patient much sooner if necessary.  Disclaimer: This note was dictated with voice recognition software. Similar sounding words can inadvertently be transcribed and may not be corrected upon review.

## 2019-07-08 ENCOUNTER — Telehealth: Payer: Self-pay | Admitting: Internal Medicine

## 2019-07-08 NOTE — Telephone Encounter (Signed)
Scheduled appt per 12/7 los.  A calendar will be mailed out.

## 2019-11-03 ENCOUNTER — Ambulatory Visit: Payer: Medicare Other

## 2020-02-10 ENCOUNTER — Other Ambulatory Visit: Payer: Self-pay

## 2020-02-10 DIAGNOSIS — I779 Disorder of arteries and arterioles, unspecified: Secondary | ICD-10-CM

## 2020-02-10 DIAGNOSIS — I6523 Occlusion and stenosis of bilateral carotid arteries: Secondary | ICD-10-CM

## 2020-02-13 ENCOUNTER — Other Ambulatory Visit (HOSPITAL_COMMUNITY): Payer: Self-pay | Admitting: Physical Medicine and Rehabilitation

## 2020-02-13 ENCOUNTER — Other Ambulatory Visit: Payer: Self-pay | Admitting: Physical Medicine and Rehabilitation

## 2020-02-13 DIAGNOSIS — M545 Low back pain, unspecified: Secondary | ICD-10-CM

## 2020-02-13 DIAGNOSIS — M546 Pain in thoracic spine: Secondary | ICD-10-CM

## 2020-02-18 ENCOUNTER — Ambulatory Visit (INDEPENDENT_AMBULATORY_CARE_PROVIDER_SITE_OTHER)
Admission: RE | Admit: 2020-02-18 | Discharge: 2020-02-18 | Disposition: A | Discharge status: Home / Self Care | Payer: Medicare Other | Source: Ambulatory Visit | Attending: Vascular Surgery | Admitting: Vascular Surgery

## 2020-02-18 ENCOUNTER — Other Ambulatory Visit: Payer: Self-pay

## 2020-02-18 ENCOUNTER — Ambulatory Visit (INDEPENDENT_AMBULATORY_CARE_PROVIDER_SITE_OTHER): Payer: Medicare Other | Admitting: Physician Assistant

## 2020-02-18 ENCOUNTER — Ambulatory Visit (HOSPITAL_COMMUNITY)
Admission: RE | Admit: 2020-02-18 | Discharge: 2020-02-18 | Disposition: A | Discharge status: Home / Self Care | Payer: Medicare Other | Source: Ambulatory Visit | Attending: Vascular Surgery | Admitting: Vascular Surgery

## 2020-02-18 VITALS — BP 174/90 | HR 80 | Temp 97.2°F | Resp 99 | Ht 60.0 in

## 2020-02-18 DIAGNOSIS — Z89611 Acquired absence of right leg above knee: Secondary | ICD-10-CM

## 2020-02-18 DIAGNOSIS — I779 Disorder of arteries and arterioles, unspecified: Secondary | ICD-10-CM

## 2020-02-18 DIAGNOSIS — I6523 Occlusion and stenosis of bilateral carotid arteries: Secondary | ICD-10-CM | POA: Insufficient documentation

## 2020-02-18 DIAGNOSIS — I771 Stricture of artery: Secondary | ICD-10-CM

## 2020-02-18 NOTE — Progress Notes (Signed)
HISTORY AND PHYSICAL     CC:  follow up. Requesting Provider:  [name], MD  HPI: This is a 86 y.o. female who is here today for follow up and is pt of Dr. Scot Dock. She has undergone previous left common iliac artery stenting in August of 2010. She has known infrainguinal arterial occlusive disease. She is status post previous right above-the-knee amputation and is ambulatory with her prosthesis    She was last seen on 01/16/2019.  At that time, she had a superficial area on the left 3rd toe on the plantar aspect and pain in the 2nd and 3rd toes after they were trimmed.    The pt returns today for follow up.  She states that back in January, she fell straight back on her back and has been having pain since then.  She states that her doctor ordered an xray and she has a lot of arthritis.  She has an appt with her doctor next month.  She states that she has some pain on the left foot medially just below the great toe that shoots back towards her leg.  She states that she wakes and uses alcolhol and salve on her leg and this helps her.  She has hx of a right AKA.  Her current prosthesis is not working properly and she has a new one coming.  She states that she lives independently and has to be very careful while walking.  She does not have any non healing wounds on her foot.  She states she has some swelling in her left leg.  She states that sometimes the veins get big and they hurt.  She denies any stroke symptoms.  The pt is on a statin for cholesterol management.    The pt is not on an aspirin.    Other AC:  Plavix The pt is on CCB and ARB for hypertension.  The pt does not have diabetes. Tobacco hx:  former    Past Medical History:  Diagnosis Date  . Arthritis   . Cancer ([place])    rt lung  . COPD GOLD I 04/18/2013  . Glaucoma   . Hyperlipidemia   . Hypertension   . Lung cancer ([place]) 06/07/2013  . Peripheral vascular disease, unspecified ([place])    with Claudication  . S/P radiation  therapy 12/12, 12/15, and 07/19/15   The LLL nodule was treated to 54 Gy in 3 fractions of 18 Gy  . Shortness of breath   . Stroke [facility])     Past Surgical History:  Procedure Laterality Date  . ABOVE KNEE LEG AMPUTATION     Right  . ANGIOPLASTY / STENTING ILIAC  03/22/09   Aortogram-  left common iliac artery  . COLONOSCOPY  Feb. 2013  . HEMORRHOID SURGERY    . TONSILLECTOMY    . VIDEO ASSISTED THORACOSCOPY (VATS)/WEDGE RESECTION Right 06/16/2013   Procedure: VIDEO ASSISTED THORACOSCOPY (VATS)/WEDGE RESECTION;  Surgeon: [name], MD;  Location: [place];  Service: Thoracic;  Laterality: Right;  Marland Kitchen VIDEO BRONCHOSCOPY Bilateral 04/10/2013   Procedure: VIDEO BRONCHOSCOPY WITH FLUORO;  Surgeon: Tanda Rockers, MD;  Location: [facility];  Service: Cardiopulmonary;  Laterality: Bilateral;  . VIDEO BRONCHOSCOPY N/A 06/16/2013   Procedure: VIDEO BRONCHOSCOPY;  Surgeon: [name], MD;  Location: [facility] OR;  Service: Thoracic;  Laterality: N/A;    Allergies  Allergen Reactions  . Aspirin Anaphylaxis  . Morphine And Related Other (See Comments)    confusion  Current Outpatient Medications  Medication Sig Dispense Refill  . albuterol (PROVENTIL) (2.5 MG/3ML) 0.083% nebulizer solution Take by nebulization.    Marland Kitchen amLODipine (NORVASC) 2.5 MG tablet Take 2.5 mg by mouth daily.   3  . budesonide-formoterol (SYMBICORT) 160-4.5 MCG/ACT inhaler Inhale 2 puffs into the lungs 2 (two) times daily. 1 Inhaler 0  . Cholecalciferol (VITAMIN D3) 1000 units CAPS Take 1 capsule by mouth daily.    . clopidogrel (PLAVIX) 75 MG tablet Take 1 tablet (75 mg total) by mouth every Monday, Wednesday, and Friday. 90 tablet 3  . DELSYM 30 MG/5ML liquid Take 10 mLs by mouth 2 (two) times daily as needed.    . diclofenac sodium (VOLTAREN) 1 % GEL     . docusate sodium (COLACE) 50 MG capsule Take 100 mg by mouth as needed for mild constipation.    . gabapentin (NEURONTIN) 100 MG capsule Take 1 capsule (100  mg total) by mouth 3 (three) times daily. 90 capsule 3  . losartan (COZAAR) 50 MG tablet   3  . LUMIGAN 0.01 % SOLN SMARTSIG:1 Drop(s) In Eye(s) Every Evening    . meclizine (ANTIVERT) 25 MG tablet Take 25 mg by mouth every 8 (eight) hours as needed for dizziness (dizziness).     . nitroGLYCERIN (NITROSTAT) 0.4 MG SL tablet SMARTSIG:1 Tablet(s) Sublingual PRN    . omeprazole (PRILOSEC) 20 MG capsule   1  . pravastatin (PRAVACHOL) 20 MG tablet   3  . predniSONE (DELTASONE) 20 MG tablet Take by mouth.    Marland Kitchen PROAIR HFA 108 (90 Base) MCG/ACT inhaler Inhale 2 puffs into the lungs every 6 (six) hours as needed.    . RESTASIS 0.05 % ophthalmic emulsion Place 2 drops into both eyes 2 (two) times daily.     Marland Kitchen tiZANidine (ZANAFLEX) 2 MG tablet   2  . TRAVATAN Z 0.004 % SOLN ophthalmic solution Place 1 drop into both eyes at bedtime.      No current facility-administered medications for this visit.    Family History  Problem Relation Age of Onset  . Stomach cancer Mother   . Stroke Brother   . Asthma Sister   . Colon cancer Brother     Social History   Socioeconomic History  . Marital status: Widowed    Spouse name: Not on file  . Number of children: 1  . Years of education: Not on file  . Highest education level: Not on file  Occupational History  . Occupation: Retired  Tobacco Use  . Smoking status: Former Smoker    Packs/day: 0.00    Years: 60.00    Pack years: 0.00    Types: Cigarettes    Quit date: 07/31/2009    Years since quitting: 10.5  . Smokeless tobacco: Never Used  Vaping Use  . Vaping Use: Never used  Substance and Sexual Activity  . Alcohol use: No  . Drug use: No  . Sexual activity: Never  Other Topics Concern  . Not on file  Social History Narrative   Pt lives alone. She is HOH, especially left ear.   Social Determinants of Health   Financial Resource Strain:   . Difficulty of Paying Living Expenses:   Food Insecurity:   . Worried About Charity fundraiser  in the Last Year:   . Arboriculturist in the Last Year:   Transportation Needs:   . Film/video editor (Medical):   Marland Kitchen Lack of Transportation (Non-Medical):  Physical Activity:   . Days of Exercise per Week:   . Minutes of Exercise per Session:   Stress:   . Feeling of Stress :   Social Connections:   . Frequency of Communication with Friends and Family:   . Frequency of Social Gatherings with Friends and Family:   . Attends Religious Services:   . Active Member of Clubs or Organizations:   . Attends Archivist Meetings:   Marland Kitchen Marital Status:   Intimate Partner Violence:   . Fear of Current or Ex-Partner:   . Emotionally Abused:   Marland Kitchen Physically Abused:   . Sexually Abused:      REVIEW OF SYSTEMS:   [X]  denotes positive finding, [ ]  denotes negative finding Cardiac  Comments:  Chest pain or chest pressure:    Shortness of breath upon exertion:    Short of breath when lying flat:    Irregular heart rhythm:        Vascular    Pain in calf, thigh, or hip brought on by ambulation:    Pain in feet at night that wakes you up from your sleep:  x   Blood clot in your veins:    Leg swelling:         Pulmonary    Oxygen at home:    Productive cough:     Wheezing:         Neurologic    Sudden weakness in arms or legs:     Sudden numbness in arms or legs:     Sudden onset of difficulty speaking or slurred speech:    Temporary loss of vision in one eye:     Problems with dizziness:         Gastrointestinal    Blood in stool:     Vomited blood:         Genitourinary    Burning when urinating:     Blood in urine:        Psychiatric    Major depression:         Hematologic    Bleeding problems:    Problems with blood clotting too easily:        Skin    Rashes or ulcers:        Constitutional    Fever or chills:      PHYSICAL EXAMINATION:  Today's Vitals   02/18/20 1023 02/18/20 1025  BP: (!) 164/81 (!) 174/90  Pulse: 80   Resp: (!) 99   Temp:  (!) 97.2 F (36.2 C)   TempSrc: Temporal   Height: 5' (1.524 m)    Body mass index is 27.6 kg/m.   General:  WDWN in NAD; vital signs documented above Gait: Not observed HENT: WNL, normocephalic; very HOH-wearing hearing aids. Pulmonary: normal non-labored breathing , without Rales, rhonchi,  wheezing Cardiac: regular HR without carotid bruits Abdomen: soft, NT, no masses Skin: without rashes Vascular Exam/Pulses:  Right Left  Radial 2+ (normal) 2+ (normal)  Popliteal AKA Unable to palpate   AT AKA Brisk monophasic  PT AKA Brisk monophasic  Peroneal AKA Faint monophasic  Unable to palpate femoral pulses due to body habitus and pt in wheelchair unable to get on table.  Extremities: without ischemic changes, without Gangrene , without cellulitis; without open wounds; there is mild swelling with mild pitting from her knee high stocking Musculoskeletal: no muscle wasting or atrophy  Neurologic: A&O X 3;  No focal weakness or paresthesias are detected Psychiatric:  The pt has Normal affect.   Non-Invasive Vascular Imaging:   ABI's/TBI's on 02/18/2020: Right:  AKA  Left:  0.49/0.28 - great toe pressure:  39   Arterial duplex on 02/18/2020: Abdominal Aorta Findings:  +-------------+-------+----------+----------+--------+--------+--------+  Location   AP (cm)Trans (cm)PSV (cm/s)WaveformThrombusComments  +-------------+-------+----------+----------+--------+--------+--------+  LT EIA Prox          80    biphasic          +-------------+-------+----------+----------+--------+--------+--------+  LT EIA Mid           100    biphasic          +-------------+-------+----------+----------+--------+--------+--------+  LT EIA Distal         118    biphasic          +-------------+-------+----------+----------+--------+--------+--------+       Left Stent(s):    +--------------+---+---------------+--------+--------+  Prox to Stent 26850-99% stenosis    stenotic  +--------------+---+---------------+--------+--------+  Proximal Stent79         biphasic      +--------------+---+---------------+--------+--------+  Mid Stent   132        biphasic      +--------------+---+---------------+--------+--------+  Distal Stent 85         biphasic      +--------------+---+---------------+--------+--------+   Distal aorta not visualized due to excessive bowel gas.    Summary:  The left common iliac artery stent appears patent with increased velocity that appears to be proximal to the stent, in the 50 - 99% stenosis range. this was a technically limited exam. Further imaging modality may be  Warranted.  Non-Invasive Vascular Imaging:   Carotid Duplex on 02/18/2020: Right:  1-39% ICA stenosis Left:  1-39% ICA stenosis  Previous ABI's/TBI's on 01/16/2019: Right:  AKA  Left:  0.57/0/24 - great toe pressure:  33  Previous arterial duplex 01/16/2019: Abdominal Aorta Findings:  +--------+-------+----------+----------+--------+--------+--------+  LocationAP (cm)Trans (cm)PSV (cm/s)WaveformThrombusComments  +--------+-------+----------+----------+--------+--------+--------+  Distal 1.64  1.66   61                  +--------+-------+----------+----------+--------+--------+--------+     Left Stent(s):  +-------------------+--------+---------------+----------+--------+  common iliac arteryPSV cm/sStenosis    Waveform Comments  +-------------------+--------+---------------+----------+--------+  Prox to Stent   64           monophasic      +-------------------+--------+---------------+----------+--------+  Proximal Stent   156   50-99% stenosismonophasic       +-------------------+--------+---------------+----------+--------+  Mid Stent     133           monophasic      +-------------------+--------+---------------+----------+--------+  Distal Stent    166           monophasic      +-------------------+--------+---------------+----------+--------+  Distal to Stent  166           monophasic      +-------------------+--------+---------------+----------+--------+  >50% proximal stent stenosis by PSV ratio criteria.     Summary:  Stenosis: +-----------------+---------------+  Location     Stent       +-----------------+---------------+  Left Common Iliac50-99% stenosis    Previous Carotid duplex on 01/16/2019: Right: 1-39% ICA stenosis Left:   1-39% ICA stenosis    ASSESSMENT/PLAN:: 86 y.o. female here for follow up for PAD with hx of left CIA stenting  PAD -pt with hx of left CIA stenting.  Her ABI's are relatively unchanged today.  She does have some pain in her left foot medial to the great toe but no open wounds.  She has a 50-99% iliac artery stenosis proximal  to stent that has been present at previous visit.  She does live independently.  Given she does not have any wounds, I will have her return in 3 months with ABI and LLE arterial duplex and see Dr. Scot Dock.  Pt knows that if she develops any wounds, that she should return to see [place] sooner.   -encouraged her to continue her sitting exercises that she does several times per day.  She knows to protect her foot.   Carotid stenosis -pt's carotid duplex remains at 1-39%.  Given this has been stable and she is asymptomatic, we can probably stop getting carotid duplex unless she has any issues.     Leontine Locket, [facility] [phone]  Clinic MD:   Scot Dock

## 2020-02-23 ENCOUNTER — Other Ambulatory Visit: Payer: Self-pay | Admitting: *Deleted

## 2020-02-23 DIAGNOSIS — I739 Peripheral vascular disease, unspecified: Secondary | ICD-10-CM

## 2020-03-03 ENCOUNTER — Other Ambulatory Visit: Payer: Self-pay

## 2020-03-03 ENCOUNTER — Encounter: Payer: Self-pay | Admitting: Podiatry

## 2020-03-03 ENCOUNTER — Ambulatory Visit (INDEPENDENT_AMBULATORY_CARE_PROVIDER_SITE_OTHER): Payer: Medicare Other | Admitting: Podiatry

## 2020-03-03 DIAGNOSIS — B351 Tinea unguium: Secondary | ICD-10-CM | POA: Diagnosis not present

## 2020-03-03 DIAGNOSIS — I739 Peripheral vascular disease, unspecified: Secondary | ICD-10-CM

## 2020-03-03 DIAGNOSIS — M79674 Pain in right toe(s): Secondary | ICD-10-CM | POA: Diagnosis not present

## 2020-03-03 DIAGNOSIS — L84 Corns and callosities: Secondary | ICD-10-CM

## 2020-03-03 DIAGNOSIS — M79675 Pain in left toe(s): Secondary | ICD-10-CM

## 2020-03-05 NOTE — Progress Notes (Signed)
Subjective: [name] is a 86 y.o. y.o. female who presents for preventative foot care today with PAD and cc of painful, discolored, thick toenails and painful callus/corn which interfere with daily activities. Pain is aggravated when wearing enclosed shoe gear. Pain is relieved with periodic professional debridement.  [name], MD is her PCP. She is accompanied by her caregiver on today's visit. Caregiver states she has been trying to schedule all of [name]'s doctors appointments.    Current Outpatient Medications:  .  albuterol (PROVENTIL) (2.5 MG/3ML) 0.083% nebulizer solution, Take by nebulization., Disp: , Rfl:  .  amLODipine (NORVASC) 2.5 MG tablet, Take 2.5 mg by mouth daily. , Disp: , Rfl: 3 .  budesonide-formoterol (SYMBICORT) 160-4.5 MCG/ACT inhaler, Inhale 2 puffs into the lungs 2 (two) times daily., Disp: 1 Inhaler, Rfl: 0 .  Cholecalciferol (VITAMIN D3) 1000 units CAPS, Take 1 capsule by mouth daily., Disp: , Rfl:  .  clopidogrel (PLAVIX) 75 MG tablet, Take 1 tablet (75 mg total) by mouth every Monday, Wednesday, and Friday., Disp: 90 tablet, Rfl: 3 .  DELSYM 30 MG/5ML liquid, Take 10 mLs by mouth 2 (two) times daily as needed., Disp: , Rfl:  .  diclofenac sodium (VOLTAREN) 1 % GEL, , Disp: , Rfl:  .  docusate sodium (COLACE) 50 MG capsule, Take 100 mg by mouth as needed for mild constipation., Disp: , Rfl:  .  gabapentin (NEURONTIN) 100 MG capsule, Take 1 capsule (100 mg total) by mouth 3 (three) times daily., Disp: 90 capsule, Rfl: 3 .  losartan (COZAAR) 50 MG tablet, , Disp: , Rfl: 3 .  LUMIGAN 0.01 % SOLN, SMARTSIG:1 Drop(s) In Eye(s) Every Evening, Disp: , Rfl:  .  meclizine (ANTIVERT) 25 MG tablet, Take 25 mg by mouth every 8 (eight) hours as needed for dizziness (dizziness). , Disp: , Rfl:  .  nitroGLYCERIN (NITROSTAT) 0.4 MG SL tablet, SMARTSIG:1 Tablet(s) Sublingual PRN, Disp: , Rfl:  .  omeprazole (PRILOSEC) 20 MG capsule, , Disp: , Rfl: 1 .  pravastatin  (PRAVACHOL) 20 MG tablet, , Disp: , Rfl: 3 .  predniSONE (DELTASONE) 20 MG tablet, Take by mouth., Disp: , Rfl:  .  PROAIR HFA 108 (90 Base) MCG/ACT inhaler, Inhale 2 puffs into the lungs every 6 (six) hours as needed., Disp: , Rfl:  .  RESTASIS 0.05 % ophthalmic emulsion, Place 2 drops into both eyes 2 (two) times daily. , Disp: , Rfl:  .  tiZANidine (ZANAFLEX) 2 MG tablet, , Disp: , Rfl: 2 .  TRAVATAN Z 0.004 % SOLN ophthalmic solution, Place 1 drop into both eyes at bedtime. , Disp: , Rfl:   Allergies  Allergen Reactions  . Aspirin Anaphylaxis  . Morphine And Related Other (See Comments)    confusion    Objective: Vascular Examination: Right below knee amputation  Capillary refill time <3 seconds x 5 digits left foot   Dorsalis pedis pulses absent left   Posterior tibial pulses absent left  No digital hair x 5 digits  Skin temperature gradient WNL  Skin temperature warm to cool b/l  Dermatological Examination: Skin thin, shiny and atrophic b/l  Toenails 1-5 left foot discolored, thick, dystrophic with subungual debris and pain with palpation to nailbeds due to thickness of nails.  Hyperkeratotic lesion distal tip left 2nd and left 3rd digits with tenderness to palpation. Hyperkeratotic lesion subhallux IPJ left foot   Musculoskeletal: Muscle strength 5/5 to all LLE muscle groups/ Tjos   Neurological: Sensation intact with  10 gram monofilament.  Vibratory sensation intact  Assessment: 1. Painful onychomycosis toenails 1-5 left foot 1. Callus subhallux IPJ left foot 2. Corn(s): distal tip left 2nd, 3rd digits 3. Peripheral arterial disease 4. S/p BKA amputation right LE  Plan: 1. No new findings. No new orders.. 2. Toenails 1-5 left foot were debrided in length and girth without iatrogenic bleeding. 3. Hyperkeratotic lesion(s) pared with sterile chisel blade and gently smoothed with burr distal tip left 2nd,  left 3rd digits and subhallux IPJ left  foot 4. Patient to continue soft, supportive shoe gear 5. Patient to report any pedal injuries to medical professional  6. Follow up 3 months.  7. Patient/POA to call should there be a concern in the interim.

## 2020-03-29 ENCOUNTER — Ambulatory Visit (HOSPITAL_COMMUNITY)
Admission: RE | Admit: 2020-03-29 | Discharge: 2020-03-29 | Disposition: A | Discharge status: Home / Self Care | Payer: Medicare Other | Source: Ambulatory Visit | Attending: Physical Medicine and Rehabilitation | Admitting: Physical Medicine and Rehabilitation

## 2020-03-29 ENCOUNTER — Other Ambulatory Visit: Payer: Self-pay

## 2020-03-29 ENCOUNTER — Encounter (HOSPITAL_COMMUNITY): Payer: Self-pay

## 2020-03-29 DIAGNOSIS — M545 Low back pain, unspecified: Secondary | ICD-10-CM

## 2020-03-29 DIAGNOSIS — M546 Pain in thoracic spine: Secondary | ICD-10-CM

## 2020-05-17 ENCOUNTER — Other Ambulatory Visit: Payer: Medicare Other

## 2020-05-19 ENCOUNTER — Ambulatory Visit: Payer: Medicare Other | Admitting: Vascular Surgery

## 2020-05-19 ENCOUNTER — Ambulatory Visit: Payer: Medicare Other | Admitting: Internal Medicine

## 2020-05-19 ENCOUNTER — Encounter (HOSPITAL_COMMUNITY): Payer: Medicare Other

## 2020-05-19 ENCOUNTER — Other Ambulatory Visit (HOSPITAL_COMMUNITY): Payer: Medicare Other

## 2020-05-20 ENCOUNTER — Ambulatory Visit: Payer: Medicare Other | Admitting: Internal Medicine

## 2020-05-26 ENCOUNTER — Inpatient Hospital Stay (HOSPITAL_COMMUNITY): Admission: RE | Admit: 2020-05-26 | Payer: Medicare Other | Source: Ambulatory Visit

## 2020-05-26 ENCOUNTER — Ambulatory Visit: Payer: Medicare Other | Admitting: Vascular Surgery

## 2020-06-07 ENCOUNTER — Ambulatory Visit: Payer: Medicare Other | Admitting: Podiatry

## 2020-07-02 ENCOUNTER — Inpatient Hospital Stay: Payer: Medicare Other | Attending: Internal Medicine

## 2020-07-02 DIAGNOSIS — Z7952 Long term (current) use of systemic steroids: Secondary | ICD-10-CM | POA: Insufficient documentation

## 2020-07-02 DIAGNOSIS — M1990 Unspecified osteoarthritis, unspecified site: Secondary | ICD-10-CM | POA: Insufficient documentation

## 2020-07-02 DIAGNOSIS — Z7951 Long term (current) use of inhaled steroids: Secondary | ICD-10-CM | POA: Insufficient documentation

## 2020-07-02 DIAGNOSIS — Z85118 Personal history of other malignant neoplasm of bronchus and lung: Secondary | ICD-10-CM | POA: Insufficient documentation

## 2020-07-02 DIAGNOSIS — I739 Peripheral vascular disease, unspecified: Secondary | ICD-10-CM | POA: Insufficient documentation

## 2020-07-02 DIAGNOSIS — J449 Chronic obstructive pulmonary disease, unspecified: Secondary | ICD-10-CM | POA: Insufficient documentation

## 2020-07-02 DIAGNOSIS — Z79899 Other long term (current) drug therapy: Secondary | ICD-10-CM | POA: Insufficient documentation

## 2020-07-02 DIAGNOSIS — H409 Unspecified glaucoma: Secondary | ICD-10-CM | POA: Insufficient documentation

## 2020-07-02 DIAGNOSIS — E785 Hyperlipidemia, unspecified: Secondary | ICD-10-CM | POA: Insufficient documentation

## 2020-07-02 DIAGNOSIS — Z923 Personal history of irradiation: Secondary | ICD-10-CM | POA: Insufficient documentation

## 2020-07-02 DIAGNOSIS — I10 Essential (primary) hypertension: Secondary | ICD-10-CM | POA: Insufficient documentation

## 2020-07-02 DIAGNOSIS — Z791 Long term (current) use of non-steroidal anti-inflammatories (NSAID): Secondary | ICD-10-CM | POA: Insufficient documentation

## 2020-07-02 DIAGNOSIS — Z8673 Personal history of transient ischemic attack (TIA), and cerebral infarction without residual deficits: Secondary | ICD-10-CM | POA: Insufficient documentation

## 2020-07-05 ENCOUNTER — Encounter: Payer: Self-pay | Admitting: Internal Medicine

## 2020-07-05 ENCOUNTER — Ambulatory Visit (HOSPITAL_COMMUNITY): Admission: RE | Admit: 2020-07-05 | Payer: Medicare Other | Source: Ambulatory Visit

## 2020-07-05 ENCOUNTER — Ambulatory Visit (HOSPITAL_COMMUNITY)
Admission: RE | Admit: 2020-07-05 | Discharge: 2020-07-05 | Disposition: A | Discharge status: Home / Self Care | Payer: Medicare Other | Source: Ambulatory Visit | Attending: Internal Medicine | Admitting: Internal Medicine

## 2020-07-05 ENCOUNTER — Other Ambulatory Visit: Payer: Self-pay

## 2020-07-05 ENCOUNTER — Inpatient Hospital Stay (HOSPITAL_BASED_OUTPATIENT_CLINIC_OR_DEPARTMENT_OTHER): Payer: Medicare Other | Admitting: Internal Medicine

## 2020-07-05 ENCOUNTER — Other Ambulatory Visit: Payer: Self-pay | Admitting: *Deleted

## 2020-07-05 VITALS — BP 141/43 | HR 62 | Temp 97.8°F | Resp 18 | Ht 60.0 in

## 2020-07-05 DIAGNOSIS — I10 Essential (primary) hypertension: Secondary | ICD-10-CM | POA: Diagnosis not present

## 2020-07-05 DIAGNOSIS — Z923 Personal history of irradiation: Secondary | ICD-10-CM | POA: Diagnosis not present

## 2020-07-05 DIAGNOSIS — I739 Peripheral vascular disease, unspecified: Secondary | ICD-10-CM | POA: Diagnosis not present

## 2020-07-05 DIAGNOSIS — Z85118 Personal history of other malignant neoplasm of bronchus and lung: Secondary | ICD-10-CM | POA: Diagnosis not present

## 2020-07-05 DIAGNOSIS — E785 Hyperlipidemia, unspecified: Secondary | ICD-10-CM | POA: Diagnosis not present

## 2020-07-05 DIAGNOSIS — C3490 Malignant neoplasm of unspecified part of unspecified bronchus or lung: Secondary | ICD-10-CM | POA: Diagnosis present

## 2020-07-05 DIAGNOSIS — Z8673 Personal history of transient ischemic attack (TIA), and cerebral infarction without residual deficits: Secondary | ICD-10-CM | POA: Diagnosis not present

## 2020-07-05 DIAGNOSIS — Z7951 Long term (current) use of inhaled steroids: Secondary | ICD-10-CM | POA: Diagnosis not present

## 2020-07-05 DIAGNOSIS — Z79899 Other long term (current) drug therapy: Secondary | ICD-10-CM | POA: Diagnosis not present

## 2020-07-05 DIAGNOSIS — H409 Unspecified glaucoma: Secondary | ICD-10-CM | POA: Diagnosis not present

## 2020-07-05 DIAGNOSIS — Z791 Long term (current) use of non-steroidal anti-inflammatories (NSAID): Secondary | ICD-10-CM | POA: Diagnosis not present

## 2020-07-05 DIAGNOSIS — J449 Chronic obstructive pulmonary disease, unspecified: Secondary | ICD-10-CM | POA: Diagnosis not present

## 2020-07-05 DIAGNOSIS — C3432 Malignant neoplasm of lower lobe, left bronchus or lung: Secondary | ICD-10-CM

## 2020-07-05 DIAGNOSIS — M1990 Unspecified osteoarthritis, unspecified site: Secondary | ICD-10-CM | POA: Diagnosis not present

## 2020-07-05 DIAGNOSIS — Z7952 Long term (current) use of systemic steroids: Secondary | ICD-10-CM | POA: Diagnosis not present

## 2020-07-05 LAB — POCT I-STAT CREATININE: Creatinine, Ser: 1 mg/dL (ref 0.44–1.00)

## 2020-07-05 MED ORDER — IOHEXOL 300 MG/ML  SOLN
75.0000 mL | Freq: Once | INTRAMUSCULAR | Status: AC | PRN
  Administered 2020-07-05: 75 mL via INTRAVENOUS

## 2020-07-05 NOTE — Progress Notes (Signed)
[facility] [phone]   [phone]  OFFICE PROGRESS NOTE  [name], MD [place] [address]  DIAGNOSIS: Stage IB (T2a, N0, M0) non-small cell lung cancer consistent with moderately differentiated adenocarcinoma diagnosed in September of 2014.   PRIOR THERAPY:  1) Bronchoscopy, right video-assisted thoracoscopy, minithoracotomy, right upper lobectomy with lymph node dissection under the care of Dr. Servando Snare on 06/16/2013. Tumor size was 3.5 CM and the myriad genetics showed high risk for recurrence. 2) status post a stereotactic radiotherapy to left lower lobe pulmonary nodule under the care of Dr. Tammi Klippel completed 07/19/2015.  CURRENT THERAPY: Observation.  INTERVAL HISTORY: [name] 86 y.o. female returns to the clinic today for follow-up visit.  The patient was accompanied by her granddaughter.  She has hearing deficit and her granddaughter was helping with the history.  She is feeling fine with no concerning complaints except for pain on the left side of the abdomen and chest after a fall.  She denied having any significant weight loss or night sweats.  She has no nausea, vomiting, diarrhea or constipation.  She continues to have shortness of breath with exertion.  The patient was supposed to have repeat CT scan of the chest before this visit but unfortunately was not performed as planned.   MEDICAL HISTORY: Past Medical History:  Diagnosis Date  . Arthritis   . Cancer ([facility])    rt lung  . COPD GOLD I 04/18/2013  . Glaucoma   . Hyperlipidemia   . Hypertension   . Lung cancer ([facility]) 06/07/2013  . Peripheral vascular disease, unspecified ([facility])    with Claudication  . S/P radiation therapy 12/12, 12/15, and 07/19/15   The LLL nodule was treated to 54 Gy in 3 fractions of 18 Gy  . Shortness of breath   . Stroke [facility])     ALLERGIES:  is allergic to aspirin and morphine and related.  MEDICATIONS:  Current  Outpatient Medications  Medication Sig Dispense Refill  . albuterol (PROVENTIL) (2.5 MG/3ML) 0.083% nebulizer solution Take by nebulization.    Marland Kitchen amLODipine (NORVASC) 2.5 MG tablet Take 2.5 mg by mouth daily.   3  . budesonide-formoterol (SYMBICORT) 160-4.5 MCG/ACT inhaler Inhale 2 puffs into the lungs 2 (two) times daily. 1 Inhaler 0  . Cholecalciferol (VITAMIN D3) 1000 units CAPS Take 1 capsule by mouth daily.    . clopidogrel (PLAVIX) 75 MG tablet Take 1 tablet (75 mg total) by mouth every Monday, Wednesday, and Friday. 90 tablet 3  . DELSYM 30 MG/5ML liquid Take 10 mLs by mouth 2 (two) times daily as needed.    . diclofenac sodium (VOLTAREN) 1 % GEL     . docusate sodium (COLACE) 50 MG capsule Take 100 mg by mouth as needed for mild constipation.    . gabapentin (NEURONTIN) 100 MG capsule Take 1 capsule (100 mg total) by mouth 3 (three) times daily. 90 capsule 3  . losartan (COZAAR) 50 MG tablet   3  . LUMIGAN 0.01 % SOLN SMARTSIG:1 Drop(s) In Eye(s) Every Evening    . meclizine (ANTIVERT) 25 MG tablet Take 25 mg by mouth every 8 (eight) hours as needed for dizziness (dizziness).     . nitroGLYCERIN (NITROSTAT) 0.4 MG SL tablet SMARTSIG:1 Tablet(s) Sublingual PRN    . omeprazole (PRILOSEC) 20 MG capsule   1  . pravastatin (PRAVACHOL) 20 MG tablet   3  . predniSONE (DELTASONE) 20 MG tablet Take by  mouth.    Marland Kitchen PROAIR HFA 108 (90 Base) MCG/ACT inhaler Inhale 2 puffs into the lungs every 6 (six) hours as needed.    . RESTASIS 0.05 % ophthalmic emulsion Place 2 drops into both eyes 2 (two) times daily.     Marland Kitchen tiZANidine (ZANAFLEX) 2 MG tablet   2  . TRAVATAN Z 0.004 % SOLN ophthalmic solution Place 1 drop into both eyes at bedtime.      No current facility-administered medications for this visit.    SURGICAL HISTORY:  Past Surgical History:  Procedure Laterality Date  . ABOVE KNEE LEG AMPUTATION     Right  . ANGIOPLASTY / STENTING ILIAC  03/22/09   Aortogram-  left common iliac artery    . COLONOSCOPY  Feb. 2013  . HEMORRHOID SURGERY    . TONSILLECTOMY    . VIDEO ASSISTED THORACOSCOPY (VATS)/WEDGE RESECTION Right 06/16/2013   Procedure: VIDEO ASSISTED THORACOSCOPY (VATS)/WEDGE RESECTION;  Surgeon: [name], MD;  Location: [facility];  Service: Thoracic;  Laterality: Right;  Marland Kitchen VIDEO BRONCHOSCOPY Bilateral 04/10/2013   Procedure: VIDEO BRONCHOSCOPY WITH FLUORO;  Surgeon: Tanda Rockers, MD;  Location: [facility];  Service: Cardiopulmonary;  Laterality: Bilateral;  . VIDEO BRONCHOSCOPY N/A 06/16/2013   Procedure: VIDEO BRONCHOSCOPY;  Surgeon: [name], MD;  Location: [facility] OR;  Service: Thoracic;  Laterality: N/A;    REVIEW OF SYSTEMS:  A comprehensive review of systems was negative except for: Respiratory: positive for dyspnea on exertion   PHYSICAL EXAMINATION: General appearance: alert, cooperative and no distress Head: Normocephalic, without obvious abnormality, atraumatic Neck: no adenopathy, no JVD, supple, symmetrical, trachea midline and thyroid not enlarged, symmetric, no tenderness/mass/nodules Lymph nodes: Cervical, supraclavicular, and axillary nodes normal. Resp: clear to auscultation bilaterally Back: symmetric, no curvature. ROM normal. No CVA tenderness. Cardio: regular rate and rhythm, S1, S2 normal, no murmur, click, rub or gallop GI: soft, non-tender; bowel sounds normal; no masses,  no organomegaly Extremities: extremities normal, atraumatic, no cyanosis or edema  ECOG PERFORMANCE STATUS: 1 - Symptomatic but completely ambulatory  Blood pressure (!) 141/43, pulse 62, temperature 97.8 F (36.6 C), resp. rate 18, height 5' (1.524 m), SpO2 99 %.  LABORATORY DATA: Lab Results  Component Value Date   WBC 4.7 07/04/2019   HGB 13.7 07/04/2019   HCT 43.0 07/04/2019   MCV 95.3 07/04/2019   PLT 272 07/04/2019      Chemistry      Component Value Date/Time   NA 138 07/04/2019 0941   NA 141 09/19/2017 1124   NA 143 05/18/2017 0853   K 4.8  07/04/2019 0941   K 4.1 05/18/2017 0853   CL 106 07/04/2019 0941   CO2 22 07/04/2019 0941   CO2 23 05/18/2017 0853   BUN 20 07/04/2019 0941   BUN 15 09/19/2017 1124   BUN 13.7 05/18/2017 0853   CREATININE 1.27 (H) 07/04/2019 0941   CREATININE 0.8 05/18/2017 0853      Component Value Date/Time   CALCIUM 9.1 07/04/2019 0941   CALCIUM 9.3 05/18/2017 0853   ALKPHOS 56 07/04/2019 0941   ALKPHOS 60 05/18/2017 0853   AST 17 07/04/2019 0941   AST 14 05/18/2017 0853   ALT 9 07/04/2019 0941   ALT 8 05/18/2017 0853   BILITOT 0.4 07/04/2019 0941   BILITOT 0.43 05/18/2017 0853       RADIOGRAPHIC STUDIES: No results found.  ASSESSMENT AND PLAN:  This is a very pleasant 86 years old African-American female with history of  a stage IB non-small cell lung cancer, adenocarcinoma status post right upper lobectomy with lymph node dissection and has been observation since November 2014. The patient is feeling fine today with no concerning complaints except for the baseline fatigue and shortness of breath. She was supposed to have repeat CT scan of the chest before this visit but unfortunately she did not show up for her appointment. We will try to get her scan done soon and if there is no concerning findings for disease recurrence, she will follow-up with her primary care physician from now on and I will see her on as-needed basis. The patient was advised to call if she has any other concerning issues. The patient voices understanding of current disease status and treatment options and is in agreement with the current care plan.  All questions were answered. The patient knows to call the clinic with any problems, questions or concerns. We can certainly see the patient much sooner if necessary.  Disclaimer: This note was dictated with voice recognition software. Similar sounding words can inadvertently be transcribed and may not be corrected upon review.

## 2020-10-09 ENCOUNTER — Emergency Department (HOSPITAL_COMMUNITY): Payer: Medicare Other

## 2020-10-09 ENCOUNTER — Emergency Department (HOSPITAL_COMMUNITY)
Admission: EM | Admit: 2020-10-09 | Discharge: 2020-10-09 | Disposition: A | Discharge status: Home / Self Care | Payer: Medicare Other | Attending: Emergency Medicine | Admitting: Emergency Medicine

## 2020-10-09 ENCOUNTER — Other Ambulatory Visit: Payer: Self-pay

## 2020-10-09 DIAGNOSIS — Z85118 Personal history of other malignant neoplasm of bronchus and lung: Secondary | ICD-10-CM | POA: Diagnosis not present

## 2020-10-09 DIAGNOSIS — Z7951 Long term (current) use of inhaled steroids: Secondary | ICD-10-CM | POA: Insufficient documentation

## 2020-10-09 DIAGNOSIS — Z87891 Personal history of nicotine dependence: Secondary | ICD-10-CM | POA: Insufficient documentation

## 2020-10-09 DIAGNOSIS — M25512 Pain in left shoulder: Secondary | ICD-10-CM | POA: Diagnosis not present

## 2020-10-09 DIAGNOSIS — W06XXXA Fall from bed, initial encounter: Secondary | ICD-10-CM | POA: Insufficient documentation

## 2020-10-09 DIAGNOSIS — Z20822 Contact with and (suspected) exposure to covid-19: Secondary | ICD-10-CM | POA: Diagnosis not present

## 2020-10-09 DIAGNOSIS — Y92003 Bedroom of unspecified non-institutional (private) residence as the place of occurrence of the external cause: Secondary | ICD-10-CM | POA: Insufficient documentation

## 2020-10-09 DIAGNOSIS — R519 Headache, unspecified: Secondary | ICD-10-CM | POA: Diagnosis not present

## 2020-10-09 DIAGNOSIS — I10 Essential (primary) hypertension: Secondary | ICD-10-CM | POA: Diagnosis not present

## 2020-10-09 DIAGNOSIS — J449 Chronic obstructive pulmonary disease, unspecified: Secondary | ICD-10-CM | POA: Diagnosis not present

## 2020-10-09 DIAGNOSIS — M545 Low back pain, unspecified: Secondary | ICD-10-CM | POA: Diagnosis not present

## 2020-10-09 DIAGNOSIS — Z79899 Other long term (current) drug therapy: Secondary | ICD-10-CM | POA: Diagnosis not present

## 2020-10-09 DIAGNOSIS — M25511 Pain in right shoulder: Secondary | ICD-10-CM | POA: Diagnosis not present

## 2020-10-09 DIAGNOSIS — M542 Cervicalgia: Secondary | ICD-10-CM | POA: Diagnosis not present

## 2020-10-09 DIAGNOSIS — W19XXXA Unspecified fall, initial encounter: Secondary | ICD-10-CM

## 2020-10-09 LAB — COMPREHENSIVE METABOLIC PANEL
ALT: 14 U/L (ref 0–44)
AST: 24 U/L (ref 15–41)
Albumin: 3.9 g/dL (ref 3.5–5.0)
Alkaline Phosphatase: 51 U/L (ref 38–126)
Anion gap: 15 (ref 5–15)
BUN: 13 mg/dL (ref 8–23)
CO2: 17 mmol/L — ABNORMAL LOW (ref 22–32)
Calcium: 9.4 mg/dL (ref 8.9–10.3)
Chloride: 105 mmol/L (ref 98–111)
Creatinine, Ser: 0.74 mg/dL (ref 0.44–1.00)
GFR, Estimated: >60 mL/min (ref >60)
Glucose, Bld: 87 mg/dL (ref 70–99)
Potassium: 4.1 mmol/L (ref 3.5–5.1)
Sodium: 137 mmol/L (ref 135–145)
Total Bilirubin: 1 mg/dL (ref 0.3–1.2)
Total Protein: 7 g/dL (ref 6.5–8.1)

## 2020-10-09 LAB — URINALYSIS, ROUTINE W REFLEX MICROSCOPIC
Bilirubin Urine: NEGATIVE
Glucose, UA: NEGATIVE mg/dL
Ketones, ur: 5 mg/dL — AB
Nitrite: NEGATIVE
Protein, ur: 30 mg/dL — AB
Specific Gravity, Urine: 1.018 (ref 1.005–1.030)
pH: 6 (ref 5.0–8.0)

## 2020-10-09 LAB — CBC
HCT: 42.8 % (ref 36.0–46.0)
Hemoglobin: 13.7 g/dL (ref 12.0–15.0)
MCH: 30.5 pg (ref 26.0–34.0)
MCHC: 32 g/dL (ref 30.0–36.0)
MCV: 95.3 fL (ref 80.0–100.0)
Platelets: 197 10*3/uL (ref 150–400)
RBC: 4.49 MIL/uL (ref 3.87–5.11)
RDW: 13.9 % (ref 11.5–15.5)
WBC: 4.6 10*3/uL (ref 4.0–10.5)
nRBC: 0 % (ref 0.0–0.2)

## 2020-10-09 LAB — CK: Total CK: 419 U/L — ABNORMAL HIGH (ref 38–234)

## 2020-10-09 LAB — RESP PANEL BY RT-PCR (FLU A&B, COVID) ARPGX2
Influenza A by PCR: NEGATIVE
Influenza B by PCR: NEGATIVE
SARS Coronavirus 2 by RT PCR: NEGATIVE

## 2020-10-09 MED ORDER — ACETAMINOPHEN 325 MG PO TABS
650.0000 mg | ORAL_TABLET | Freq: Once | ORAL | Status: AC
  Administered 2020-10-09: 650 mg via ORAL
  Filled 2020-10-09: qty 2

## 2020-10-09 NOTE — ED Provider Notes (Signed)
[facility] DEPT Provider Note   CSN: [id number] Arrival date & time: 10/09/20  0086     History Chief Complaint  Patient presents with  . Fall    [name] is a 87 y.o. female with pertinent past medical history of non-small cell lung cancer status post right upper lobectomy & radiotherapy, currently under observation followed by Dr. Earlie Server, COPD, stroke, hypertension, hyperlipidemia that presents emergency department today for fall brought in by EMS.  Patient states that she was trying to get a piece of paper from under her bed, fell and landed on her head, currently complaining of neck pain and a sore head.  Patient is also complaining of back pain that has been ongoing in addition to bilateral shoulder pain which she has had for the past week but worsened with her fall.  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  Was on the floor for some of the night.  Did not lose consciousness.  Is not on any blood thinners.  Patient lives alone.  Denies any vision changes, numbness or tingling, was in her normal health before this.   HPI     Past Medical History:  Diagnosis Date  . Arthritis   . Cancer ([place])    rt lung  . COPD GOLD I 04/18/2013  . Glaucoma   . Hyperlipidemia   . Hypertension   . Lung cancer ([place]) 06/07/2013  . Peripheral vascular disease, unspecified ([place])    with Claudication  . S/P radiation therapy 12/12, 12/15, and 07/19/15   The LLL nodule was treated to 54 Gy in 3 fractions of 18 Gy  . Shortness of breath   . Stroke [facility])     Patient Active Problem List   Diagnosis Date Noted  . Cough 02/15/2016  . Bilateral sensorineural hearing loss 12/22/2015  . Sepsis ([place]) 10/26/2015  . Vertigo 08/03/2015  . Full code status 05/10/2015  . Numbness-Left  Heel and Toes  10/28/2014  . Sharp pain- Left Lateral Thigh and Right Stump 10/28/2014  . Cancer of lower lobe of left lung ([place]) 06/07/2013  . COPD GOLD I 04/18/2013  . Hemoptysis 04/10/2013  . Pain in joint, lower leg 03/19/2013  . Iliac artery stenosis, left ([place]) 03/13/2012  . Peripheral vascular disease, unspecified ([place]) 02/28/2012  . Chest pain 10/04/2011  . Hypertension 10/04/2011  . Hyperlipidemia 10/04/2011    Past Surgical History:  Procedure Laterality Date  . ABOVE KNEE LEG AMPUTATION     Right  . ANGIOPLASTY / STENTING ILIAC  03/22/09   Aortogram-  left common iliac artery  . COLONOSCOPY  Feb. 2013  . HEMORRHOID SURGERY    . TONSILLECTOMY    . VIDEO ASSISTED THORACOSCOPY (VATS)/WEDGE RESECTION Right 06/16/2013   Procedure: VIDEO ASSISTED THORACOSCOPY (VATS)/WEDGE RESECTION;  Surgeon: [name], MD;  Location: [facility];  Service: Thoracic;  Laterality: Right;  Marland Kitchen VIDEO BRONCHOSCOPY Bilateral 04/10/2013   Procedure: VIDEO BRONCHOSCOPY WITH FLUORO;  Surgeon: Tanda Rockers, MD;  Location: [facility];  Service: Cardiopulmonary;  Laterality: Bilateral;  . VIDEO BRONCHOSCOPY N/A 06/16/2013   Procedure: VIDEO BRONCHOSCOPY;  Surgeon: [name], MD;  Location: [facility];  Service: Thoracic;  Laterality: N/A;     OB History   No obstetric history  on file.     Family History  Problem Relation Age of Onset  . Stomach cancer Mother   . Stroke Brother   . Asthma Sister   . Colon cancer Brother     Social History   Tobacco Use  . Smoking status: Former Smoker    Packs/day: 0.00    Years: 60.00    Pack years: 0.00    Types: Cigarettes    Quit date: 07/31/2009    Years since quitting: 11.2  . Smokeless tobacco: Never Used  Vaping Use  . Vaping Use: Never used  Substance Use Topics  . Alcohol use: No  . Drug use: No    Home Medications Prior to Admission medications   Medication Sig Start Date End Date Taking? Authorizing Provider  albuterol  (PROVENTIL) (2.5 MG/3ML) 0.083% nebulizer solution Take by nebulization. 10/15/19   [provider]  amLODipine (NORVASC) 2.5 MG tablet Take 2.5 mg by mouth daily.  06/23/15   [provider]  budesonide-formoterol (SYMBICORT) 160-4.5 MCG/ACT inhaler Inhale 2 puffs into the lungs 2 (two) times daily. 11/03/17   Quintella Reichert, MD  Cholecalciferol (VITAMIN D3) 1000 units CAPS Take 1 capsule by mouth daily.    [provider]  clopidogrel (PLAVIX) 75 MG tablet Take 1 tablet (75 mg total) by mouth every Monday, Wednesday, and Friday. 10/01/17   Clent Demark, PA-C  DELSYM 30 MG/5ML liquid Take 10 mLs by mouth 2 (two) times daily as needed. 11/05/19   [provider]  diclofenac sodium (VOLTAREN) 1 % GEL  08/15/18   [provider]  docusate sodium (COLACE) 50 MG capsule Take 100 mg by mouth as needed for mild constipation.    [provider]  gabapentin (NEURONTIN) 100 MG capsule Take 1 capsule (100 mg total) by mouth 3 (three) times daily. 09/19/17   Clent Demark, PA-C  losartan (COZAAR) 50 MG tablet  05/15/17   [provider]  LUMIGAN 0.01 % SOLN SMARTSIG:1 Drop(s) In Eye(s) Every Evening 02/03/20   [provider]  meclizine (ANTIVERT) 25 MG tablet Take 25 mg by mouth every 8 (eight) hours as needed for dizziness (dizziness).     [provider]  nitroGLYCERIN (NITROSTAT) 0.4 MG SL tablet SMARTSIG:1 Tablet(s) Sublingual PRN 01/29/20   [provider]  omeprazole (PRILOSEC) 20 MG capsule  11/12/17   [provider]  pravastatin (PRAVACHOL) 20 MG tablet  05/04/17   [provider]  predniSONE (DELTASONE) 20 MG tablet Take by mouth. 10/15/19   [provider]  PROAIR HFA 108 (90 Base) MCG/ACT inhaler Inhale 2 puffs into the lungs every 6 (six) hours as needed. 02/12/20   [provider]  RESTASIS 0.05 % ophthalmic emulsion Place 2 drops into both eyes 2 (two) times daily.  03/30/16    [provider]  tiZANidine (ZANAFLEX) 2 MG tablet  11/12/17   [provider]  TRAVATAN Z 0.004 % SOLN ophthalmic solution Place 1 drop into both eyes at bedtime.  03/29/16   [provider]    Allergies    Aspirin and Morphine and related  Review of Systems   Review of Systems  Constitutional: Negative for chills, diaphoresis, fatigue and fever.  HENT: Negative for congestion, sore throat and trouble swallowing.   Eyes: Negative for pain and visual disturbance.  Respiratory: Negative for cough, shortness of breath and wheezing.   Cardiovascular: Negative for chest pain, palpitations and leg swelling.  Gastrointestinal: Negative for abdominal distention, abdominal  pain, diarrhea, nausea and vomiting.  Genitourinary: Negative for difficulty urinating.  Musculoskeletal: Positive for arthralgias, back pain and neck pain. Negative for neck stiffness.  Skin: Negative for pallor.  Neurological: Negative for dizziness, speech difficulty, weakness and headaches.  Psychiatric/Behavioral: Negative for confusion.    Physical Exam Updated Vital Signs BP (!) 171/65 (BP Location: Left Arm)   Pulse 86   Temp 97.8 F (36.6 C) (Oral)   Resp 16   Ht 5' (1.524 m)   Wt 65.8 kg   SpO2 99%   BMI 28.32 kg/m   Physical Exam Constitutional:      General: She is not in acute distress.    Appearance: Normal appearance. She is not ill-appearing, toxic-appearing or diaphoretic.  HENT:     Head: Normocephalic and atraumatic.     Mouth/Throat:     Mouth: Mucous membranes are moist.     Pharynx: Oropharynx is clear.  Eyes:     General: No scleral icterus.    Extraocular Movements: Extraocular movements intact.     Pupils: Pupils are equal, round, and reactive to light.  Neck:     Comments: Patient does have cervical midline tenderness, no crepitus felt.  Does have c-collar on Cardiovascular:     Rate and Rhythm: Normal rate and regular rhythm.     Pulses: Normal pulses.      Heart sounds: Normal heart sounds.  Pulmonary:     Effort: Pulmonary effort is normal. No respiratory distress.     Breath sounds: Normal breath sounds. No stridor. No wheezing, rhonchi or rales.  Chest:     Chest wall: No tenderness.  Abdominal:     General: Abdomen is flat. There is no distension.     Palpations: Abdomen is soft.     Tenderness: There is no abdominal tenderness. There is no guarding or rebound.  Musculoskeletal:        General: No swelling. Normal range of motion.     Cervical back: Normal range of motion and neck supple. Tenderness present. No rigidity.     Right lower leg: No edema.     Left lower leg: No edema.     Comments: There is midline tenderness to cervical, thoracic and lumbar spine with paraspinal muscle tenderness as well, no crepitus felt.  Unsure if this tenderness is new, no specific point tenderness.  Tenderness to bilateral shoulders, also no crepitus felt.  Patient is able to range shoulders above her head, no obvious deformity.  No tenderness to elbows or wrist.  Lower extremities without any tenderness, patient does have above-the-knee amputation on right side, able to range that extremity.  Left side lower with normal range of motion.  Skin:    General: Skin is warm and dry.     Capillary Refill: Capillary refill takes less than 2 seconds.     Coloration: Skin is not pale.  Neurological:     General: No focal deficit present.     Mental Status: She is alert and oriented to person, place, and time.     Cranial Nerves: No cranial nerve deficit.     Sensory: No sensory deficit.     Motor: No weakness.     Comments: Alert. Clear speech. No facial droop. CNIII-XII grossly intact. Bilateral upper and lower extremities' sensation grossly intact. Good strength to bilateral hips.   Normal finger to nose bilaterally. Negative pronator drift.   Psychiatric:        Mood and Affect: Mood normal.  Behavior: Behavior normal.     ED Results /  Procedures / Treatments   Labs (all labs ordered are listed, but only abnormal results are displayed) Labs Reviewed  CK - Abnormal; Notable for the following components:      Result Value   Total CK 419 (*)    All other components within normal limits  COMPREHENSIVE METABOLIC PANEL - Abnormal; Notable for the following components:   CO2 17 (*)    All other components within normal limits  URINALYSIS, ROUTINE W REFLEX MICROSCOPIC - Abnormal; Notable for the following components:   APPearance HAZY (*)    Hgb urine dipstick SMALL (*)    Ketones, ur 5 (*)    Protein, ur 30 (*)    Leukocytes,Ua TRACE (*)    Bacteria, UA FEW (*)    All other components within normal limits  RESP PANEL BY RT-PCR (FLU A&B, COVID) [id number]  URINE CULTURE  CBC    EKG None  Radiology DG Chest 1 View  Result Date: 10/09/2020 CLINICAL DATA:  Fall 1 day prior EXAM: CHEST  1 VIEW COMPARISON:  11/03/2017 chest radiograph. FINDINGS: Stable cardiomediastinal silhouette with top-normal heart size. No pneumothorax. No pleural effusion. No pulmonary edema. Mild platelike scarring in the left mid lung is unchanged. No acute consolidative airspace disease. No displaced fractures. IMPRESSION: No active disease. Electronically Signed   By: Ilona Sorrel M.D.   On: 10/09/2020 10:43   DG Thoracic Spine 2 View  Result Date: 10/09/2020 CLINICAL DATA:  Fall 1 day prior with back pain EXAM: THORACIC SPINE 2 VIEWS COMPARISON:  03/29/2020 thoracic spine MRI FINDINGS: Thoracic vertebral body heights are preserved, with no fracture or subluxation. No suspicious focal osseous lesions. Moderate degenerative disc disease throughout the thoracic spine. Atherosclerotic aortic arch. IMPRESSION: No thoracic spine fracture or subluxation. Moderate degenerative disc disease throughout the thoracic spine. Electronically Signed   By: Ilona Sorrel M.D.   On: 10/09/2020 10:41   DG Lumbar Spine Complete  Result Date: 10/09/2020 CLINICAL DATA:   Fall 1 day prior with back pain EXAM: LUMBAR SPINE - COMPLETE 4+ VIEW COMPARISON:  03/29/2020 lumbar spine MRI FINDINGS: This report assumes 5 non rib-bearing lumbar vertebrae. Lumbar vertebral body heights are preserved, with no fracture. Marked multilevel degenerative disc disease in the lumbar spine, most prominent at L3-4 and L4-5. Stable minimal 2 mm anterolisthesis L3-4 and L4-5. Mild bilateral lower lumbar facet arthropathy. No aggressive appearing focal osseous lesions. Vascular stent overlies the left common iliac artery. Abdominal aortic atherosclerosis. IMPRESSION: 1. No lumbar spine fracture or acute malalignment. 2. Marked multilevel lumbar degenerative disc disease, most prominent at L3-4 and L4-5. 3. Minimal multilevel lumbar spondylolisthesis and mild lower lumbar facet arthropathy. Electronically Signed   By: Ilona Sorrel M.D.   On: 10/09/2020 10:27   DG Pelvis 1-2 Views  Result Date: 10/09/2020 CLINICAL DATA:  Fall 1 day prior with pelvic and back pain EXAM: PELVIS - 1-2 VIEW COMPARISON:  05/31/2015 PET-CT FINDINGS: No pelvic fracture or diastasis. No evidence of hip dislocation on this frontal view. Marked degenerative disc disease in the lower lumbar spine. No suspicious focal osseous lesions. Vascular stent overlies the left common iliac artery. IMPRESSION: No pelvic fracture. Electronically Signed   By: Ilona Sorrel M.D.   On: 10/09/2020 10:42   DG Shoulder Right  Result Date: 10/09/2020 CLINICAL DATA:  Fall 1 day prior with shoulder pain EXAM: RIGHT SHOULDER - 2+ VIEW COMPARISON:  None. FINDINGS: No fracture. No glenohumeral dislocation.  No evidence of acromioclavicular separation. No suspicious focal osseous lesions. No significant arthropathy. No radiopaque foreign bodies or pathologic soft tissue calcifications. IMPRESSION: No right shoulder fracture or malalignment. Electronically Signed   By: Ilona Sorrel M.D.   On: 10/09/2020 10:45   CT Head Wo Contrast  Result Date:  10/09/2020 CLINICAL DATA:  Pain following fall EXAM: CT HEAD WITHOUT CONTRAST CT CERVICAL SPINE WITHOUT CONTRAST TECHNIQUE: Multidetector CT imaging of the head and cervical spine was performed following the standard protocol without intravenous contrast. Multiplanar CT image reconstructions of the cervical spine were also generated. COMPARISON:  Head CT July 19, 2010 FINDINGS: CT HEAD FINDINGS Brain: There is mild to moderate diffuse atrophy. There is no appreciable intracranial mass, hemorrhage, extra-axial fluid collection, or midline shift. There is decreased attenuation in the mid to lower midbrain on the left extending to involve much of the left pons, an appearance felt to be indicative of acute brainstem infarct in this area. There appears to be edema in this area focally. Elsewhere there is small vessel disease in the centra semiovale bilaterally. Decreased attenuation is noted in the head of the caudate nucleus on the right as well as in the anterior limbs of each internal and external capsules. Vascular: No hyperdense vessel. There is calcification in each carotid siphon region. Skull: The bony calvarium appears intact. There is hyperostosis throughout much the bony calvarium, a stable finding compared to prior study. Sinuses/Orbits: There is mucosal thickening in several ethmoid air cells. Orbits appear symmetric bilaterally. There is evidence of previous cataract surgery on the left. Other: Mastoid air cells on the right are clear. There is opacification of several inferior mastoid air cells on the left. CT CERVICAL SPINE FINDINGS Alignment: There is no appreciable spondylolisthesis. Skull base and vertebrae: Skull base and craniocervical junction regions appear normal. No acute fracture evident. No blastic or lytic bone lesions. Soft tissues and spinal canal: Prevertebral soft tissues and predental space regions are normal. No cord or canal hematoma. No evident paraspinous lesions. Disc levels:  There is severe disc space narrowing at C3-4, C4-5, C5-6, and C6-7. There is facet hypertrophy at multiple levels bilaterally. There is no disc extrusion or stenosis. There is impression on the exiting nerve root on the right at C6-7 and to a lesser degree on the left at C6-7 due to bony hypertrophy. Upper chest: Visualized upper lung regions are clear. Other: There is aortic atherosclerosis as well as foci of calcification in each carotid artery and subclavian artery. IMPRESSION: CT head: 1. Suspected acute infarct involving left brainstem region with edema in this area. No associated hemorrhage. 2. Atrophy with periventricular small vessel disease. Small prior infarct in the head of the caudate nucleus on the right as well as small vessel disease in the anterior limbs of the internal and external capsules bilaterally. 3.  Foci of arterial vascular calcification noted. 4.  Mucosal thickening in several ethmoid air cells. 5.  Opacification in several inferior mastoid air cells on the left. CT cervical spine: 1.  No appreciable fracture or spondylolisthesis. 2. Multilevel osteoarthritic change. Impression on exiting nerve roots at C6-7 bilaterally due to bony hypertrophy. No disc extrusion or stenosis. 3. Atherosclerotic calcification in the aorta as well as in proximal subclavian arteries bilaterally and in carotid arteries bilaterally. Aortic Atherosclerosis (ICD10-I70.0). Electronically Signed   By: Lowella Grip III M.D.   On: 10/09/2020 10:09   CT Cervical Spine Wo Contrast  Result Date: 10/09/2020 : Report of head CT and cervical  spine CT are combined into a single dictation. Electronically Signed   By: Lowella Grip III M.D.   On: 10/09/2020 10:09   MR Brain Wo Contrast (neuro protocol)  Result Date: 10/09/2020 CLINICAL DATA:  Fall.  Suspected brainstem stroke on CT. EXAM: MRI HEAD WITHOUT CONTRAST TECHNIQUE: Multiplanar, multiecho pulse sequences of the brain and surrounding structures were  obtained without intravenous contrast. COMPARISON:  Head CT 10/09/2020 and MRI 05/14/2013 FINDINGS: Brain: There is no evidence of an acute infarct, intracranial hemorrhage, mass, midline shift, or extra-axial fluid collection. The suspected edema/acute infarct in the left pons on CT is not confirmed by MRI and was likely artifactual. Patchy T2 hyperintensities in the cerebral white matter and pons have progressed from 2014 and are nonspecific but compatible with moderate to severe chronic small vessel ischemic disease. A chronic lacunar infarct in the right caudate nucleus is new from 2014. Dilated perivascular spaces are noted in the deep gray nuclei bilaterally. There is mild-to-moderate cerebral atrophy. Vascular: Major intracranial vascular flow voids are preserved. Skull and upper cervical spine: Unremarkable bone marrow signal. Moderate to severe disc degeneration in the cervical spine. Sinuses/Orbits: Bilateral cataract extraction. Chronic right maxillary sinusitis. Chronic left larger than right mastoid effusions. Other: None. IMPRESSION: 1. No acute intracranial abnormality. 2. Moderate to severe chronic small vessel ischemic disease. Electronically Signed   By: Logan Bores M.D.   On: 10/09/2020 13:48   DG Shoulder Left  Result Date: 10/09/2020 CLINICAL DATA:  Fall 1 day prior with left shoulder pain EXAM: LEFT SHOULDER - 2+ VIEW COMPARISON:  08/17/2011 left shoulder radiographs FINDINGS: No fracture. No glenohumeral dislocation. No evidence of acromioclavicular separation. No significant arthropathy. No suspicious focal osseous lesions. Tiny left subacromial spur. IMPRESSION: No left shoulder fracture or malalignment. Tiny left subacromial spur. Electronically Signed   By: Ilona Sorrel M.D.   On: 10/09/2020 10:44    Procedures Procedures   Medications Ordered in ED Medications  acetaminophen (TYLENOL) tablet 650 mg (650 mg Oral Given 10/09/20 1016)    ED Course  I have reviewed the triage  vital signs and the nursing notes.  Pertinent labs & imaging results that were available during my care of the patient were reviewed by me and considered in my medical decision making (see chart for details).    MDM Rules/Calculators/A&P                         SCOTT FIX is a 87 y.o. female with pertinent past medical history of non-small cell lung cancer status post right upper lobectomy and history tactic radiotherapy, currently under observation followed by Dr. Earlie Server, COPD, stroke, hypertension, hyperlipidemia that presents emergency department today for fall brought in by EMS.   CT head does show questionable acute infarct involving left brainstem, patient does not have any neuro deficits on exam. Spoke to Dr. Curly Shores, neurology who recommends MRI for further evaluation, if negative this is most likely an overread.   MRI negative.  Plain films negative.  Patient is able to ambulate normally, patient states that she feels much better with Tylenol board.  Was able to speak to patient's granddaughter who is her primary caretaker who is in the room, she states that she will stay with her for the next couple of days and look after her.  Is asking for further assistance at home, will place PT/OT F50f for patient. Pt asking to go home, granddaughter ok with this.   Work-up today  unremarkable, urine does show trace leukocytes, will obtain urine culture.  Patient is drinking plenty of water.  She is asking to leave, feels much better, repeat back exam without any cervical, thoracic or lumbar tenderness.   Doubt need for further emergent work up at this time. I explained the diagnosis and have given explicit precautions to return to the ER including for any other new or worsening symptoms. The patient understands and accepts the medical plan as it's been dictated and I have answered their questions. Discharge instructions concerning home care and prescriptions have been given. The patient is STABLE and  is discharged to home in good condition.  I discussed this case with my attending physician who cosigned this note including patient's presenting symptoms, physical exam, and planned diagnostics and interventions. Attending physician stated agreement with plan or made changes to plan which were implemented.   Attending physician assessed patient at bedside.  Final Clinical Impression(s) / ED Diagnoses Final diagnoses:  Fall, initial encounter    Rx / DC Orders ED Discharge Orders         [place]        10/09/20 1522    Face-to-face encounter (required for Medicare/Medicaid patients)       Comments: [facility] certify that this patient is under my care and that I, or a nurse practitioner or physician's assistant working with me, had a face-to-face encounter that meets the physician face-to-face encounter requirements with this patient on 10/09/2020. The encounter with the patient was in whole, or in part for the following medical condition(s) which is the primary reason for home health care fall, need help at home due to advanced age.   10/09/20 [place], Mersedes Alber, PA-C 10/09/20 1610    Dorie Rank, MD 10/10/20 [phone]

## 2020-10-09 NOTE — ED Triage Notes (Signed)
Pt coming in from home today. Ems states she fell in her bed room last night and laid on the wood floor until this morning when she was found by neighbor. Pt complains of right shoulder pain, neck pain, and back pain. Pt did hit her head when she fell but denies LOC.

## 2020-10-09 NOTE — ED Notes (Signed)
c-collar removed by MD.

## 2020-10-09 NOTE — Discharge Instructions (Addendum)
Please use the following attachments, take Tylenol instructed on the bottle for pain.  You should be receiving a notification saying that a home health aide will be coming to your house soon.  Please follow-up with your primary care in the next couple of days.  If you have any new worsening concerning symptoms please come back to the emergency department.  Make sure to stay hydrated.

## 2020-10-09 NOTE — ED Notes (Signed)
Pure Wick placed on patient.

## 2020-10-09 NOTE — ED Notes (Signed)
Pt able to ambulate with assistance and walker

## 2020-10-11 LAB — URINE CULTURE

## 2020-10-18 ENCOUNTER — Emergency Department (HOSPITAL_COMMUNITY): Payer: Medicare Other

## 2020-10-18 ENCOUNTER — Emergency Department (HOSPITAL_COMMUNITY)
Admission: EM | Admit: 2020-10-18 | Discharge: 2020-10-18 | Disposition: A | Discharge status: Home / Self Care | Payer: Medicare Other | Attending: Emergency Medicine | Admitting: Emergency Medicine

## 2020-10-18 ENCOUNTER — Encounter (HOSPITAL_COMMUNITY): Payer: Self-pay

## 2020-10-18 ENCOUNTER — Other Ambulatory Visit: Payer: Self-pay

## 2020-10-18 DIAGNOSIS — Z7902 Long term (current) use of antithrombotics/antiplatelets: Secondary | ICD-10-CM | POA: Diagnosis not present

## 2020-10-18 DIAGNOSIS — Z79899 Other long term (current) drug therapy: Secondary | ICD-10-CM | POA: Insufficient documentation

## 2020-10-18 DIAGNOSIS — M25552 Pain in left hip: Secondary | ICD-10-CM | POA: Diagnosis present

## 2020-10-18 DIAGNOSIS — J449 Chronic obstructive pulmonary disease, unspecified: Secondary | ICD-10-CM | POA: Diagnosis not present

## 2020-10-18 DIAGNOSIS — Z85118 Personal history of other malignant neoplasm of bronchus and lung: Secondary | ICD-10-CM | POA: Diagnosis not present

## 2020-10-18 DIAGNOSIS — R262 Difficulty in walking, not elsewhere classified: Secondary | ICD-10-CM | POA: Diagnosis not present

## 2020-10-18 DIAGNOSIS — M545 Low back pain, unspecified: Secondary | ICD-10-CM | POA: Insufficient documentation

## 2020-10-18 DIAGNOSIS — Z87891 Personal history of nicotine dependence: Secondary | ICD-10-CM | POA: Insufficient documentation

## 2020-10-18 DIAGNOSIS — R531 Weakness: Secondary | ICD-10-CM | POA: Insufficient documentation

## 2020-10-18 DIAGNOSIS — I10 Essential (primary) hypertension: Secondary | ICD-10-CM | POA: Insufficient documentation

## 2020-10-18 DIAGNOSIS — Z7951 Long term (current) use of inhaled steroids: Secondary | ICD-10-CM | POA: Diagnosis not present

## 2020-10-18 DIAGNOSIS — M25559 Pain in unspecified hip: Secondary | ICD-10-CM

## 2020-10-18 MED ORDER — ONDANSETRON 4 MG PO TBDP
4.0000 mg | ORAL_TABLET | Freq: Once | ORAL | Status: AC
  Administered 2020-10-18: 4 mg via ORAL
  Filled 2020-10-18: qty 1

## 2020-10-18 MED ORDER — FENTANYL CITRATE (PF) 100 MCG/2ML IJ SOLN: 50.0000 ug | INTRAMUSCULAR | Status: DC | PRN

## 2020-10-18 MED ORDER — OXYCODONE-ACETAMINOPHEN 5-325 MG PO TABS
1.0000 | ORAL_TABLET | Freq: Once | ORAL | Status: AC
Start: 2020-10-18 — End: 2020-10-18
  Administered 2020-10-18: 1 via ORAL
  Filled 2020-10-18: qty 1

## 2020-10-18 MED ORDER — ONDANSETRON HCL 4 MG/2ML IJ SOLN: 4.0000 mg | Freq: Once | INTRAMUSCULAR | Status: DC

## 2020-10-18 MED ORDER — OXYCODONE HCL 5 MG PO TABS: 2.5000 mg | ORAL_TABLET | Freq: Four times a day (QID) | ORAL | 0 refills | Status: DC | PRN

## 2020-10-18 NOTE — ED Triage Notes (Signed)
Pt here for left hip and buttocks pain x 1 week. Pt fell at home last week and was seen for fall, but states pain has persisted. Uses walker at home, but states it's too painful to walk. Pt w hx of dementia- A&O X3

## 2020-10-18 NOTE — ED Provider Notes (Signed)
[facility] DEPT Provider Note   CSN: [id number] Arrival date & time: 10/18/20  1302     History Chief Complaint  Patient presents with  . Hip Pain    [name] is a 87 y.o. female who presents emergency department with a chief complaint of left hip pain.  Patient fell off of her bed and was seen in the emergency department on 10/10/2020.  She had an extensive work-up including CT head, C-spine, plain films of the lumbar spine thoracic spine and chest right and left shoulders and pelvis and an MR of the brain without significant finding.  She was sent back today via EMS for severe pain in the left lower back radiating down the leg, weakness and difficulty walking with her walker.  EMS reports that her daughter and aide state that her pain is poorly controlled with Tylenol at home.  She is also on gabapentin normally.  She is status post right AKA and has a history of peripheral arterial disease.  Patient is hard of hearing but states that her pain is severe and hurts everywhere.  She denies saddle anesthesia, loss of bowel or bladder continence.  She is able to walk but states that her legs give out on her easily.  HPI     Past Medical History:  Diagnosis Date  . Arthritis   . Cancer ([place])    rt lung  . COPD GOLD I 04/18/2013  . Glaucoma   . Hyperlipidemia   . Hypertension   . Lung cancer ([place]) 06/07/2013  . Peripheral vascular disease, unspecified ([place])    with Claudication  . S/P radiation therapy 12/12, 12/15, and 07/19/15   The LLL nodule was treated to 54 Gy in 3 fractions of 18 Gy  . Shortness of breath   . Stroke [facility])     Patient Active Problem List   Diagnosis Date Noted  . Cough 02/15/2016  . Bilateral sensorineural hearing loss 12/22/2015  . Sepsis ([place]) 10/26/2015  . Vertigo 08/03/2015  . Full code status 05/10/2015  . Numbness-Left Heel and Toes  10/28/2014  . Sharp pain- Left Lateral Thigh and Right Stump 10/28/2014  . Cancer  of lower lobe of left lung ([place]) 06/07/2013  . COPD GOLD I 04/18/2013  . Hemoptysis 04/10/2013  . Pain in joint, lower leg 03/19/2013  . Iliac artery stenosis, left ([place]) 03/13/2012  . Peripheral vascular disease, unspecified ([place]) 02/28/2012  . Chest pain 10/04/2011  . Hypertension 10/04/2011  . Hyperlipidemia 10/04/2011    Past Surgical History:  Procedure Laterality Date  . ABOVE KNEE LEG AMPUTATION     Right  . ANGIOPLASTY / STENTING ILIAC  03/22/09   Aortogram-  left common iliac artery  . COLONOSCOPY  Feb. 2013  . HEMORRHOID SURGERY    . TONSILLECTOMY    . VIDEO ASSISTED THORACOSCOPY (VATS)/WEDGE RESECTION Right 06/16/2013   Procedure: VIDEO ASSISTED THORACOSCOPY (VATS)/WEDGE RESECTION;  Surgeon: [name], MD;  Location: [facility];  Service: Thoracic;  Laterality: Right;  Marland Kitchen VIDEO BRONCHOSCOPY Bilateral 04/10/2013   Procedure: VIDEO BRONCHOSCOPY WITH FLUORO;  Surgeon: Tanda Rockers, MD;  [facility];  Service: Cardiopulmonary;  Laterality: Bilateral;  . VIDEO BRONCHOSCOPY N/A 06/16/2013   Procedure: VIDEO BRONCHOSCOPY;  Surgeon: [name], MD;  [facility]: [facility];  Service: Thoracic;  Laterality: N/A;     OB History   No obstetric history on file.     Family History  Problem Relation Age of Onset  .  Stomach cancer Mother   . Stroke Brother   . Asthma Sister   . Colon cancer Brother     Social History   Tobacco Use  . Smoking status: Former Smoker    Packs/day: 0.00    Years: 60.00    Pack years: 0.00    Types: Cigarettes    Quit date: 07/31/2009    Years since quitting: 11.2  . Smokeless tobacco: Never Used  Vaping Use  . Vaping Use: Never used  Substance Use Topics  . Alcohol use: No  . Drug use: No    Home Medications Prior to Admission medications   Medication Sig Start Date End Date Taking? Authorizing Provider  albuterol (PROVENTIL) (2.5 MG/3ML) 0.083% nebulizer solution Take by nebulization. 10/15/19   [provider]   amLODipine (NORVASC) 2.5 MG tablet Take 2.5 mg by mouth daily.  06/23/15   [provider]  budesonide-formoterol (SYMBICORT) 160-4.5 MCG/ACT inhaler Inhale 2 puffs into the lungs 2 (two) times daily. 11/03/17   Quintella Reichert, MD  Cholecalciferol (VITAMIN D3) 1000 units CAPS Take 1 capsule by mouth daily.    [provider]  clopidogrel (PLAVIX) 75 MG tablet Take 1 tablet (75 mg total) by mouth every Monday, Wednesday, and Friday. 10/01/17   Clent Demark, PA-C  DELSYM 30 MG/5ML liquid Take 10 mLs by mouth 2 (two) times daily as needed. 11/05/19   [provider]  diclofenac sodium (VOLTAREN) 1 % GEL  08/15/18   [provider]  docusate sodium (COLACE) 50 MG capsule Take 100 mg by mouth as needed for mild constipation.    [provider]  gabapentin (NEURONTIN) 100 MG capsule Take 1 capsule (100 mg total) by mouth 3 (three) times daily. 09/19/17   Clent Demark, PA-C  losartan (COZAAR) 50 MG tablet  05/15/17   [provider]  LUMIGAN 0.01 % SOLN SMARTSIG:1 Drop(s) In Eye(s) Every Evening 02/03/20   [provider]  meclizine (ANTIVERT) 25 MG tablet Take 25 mg by mouth every 8 (eight) hours as needed for dizziness (dizziness).     [provider]  nitroGLYCERIN (NITROSTAT) 0.4 MG SL tablet SMARTSIG:1 Tablet(s) Sublingual PRN 01/29/20   [provider]  omeprazole (PRILOSEC) 20 MG capsule  11/12/17   [provider]  pravastatin (PRAVACHOL) 20 MG tablet  05/04/17   [provider]  predniSONE (DELTASONE) 20 MG tablet Take by mouth. 10/15/19   [provider]  PROAIR HFA 108 (90 Base) MCG/ACT inhaler Inhale 2 puffs into the lungs every 6 (six) hours as needed. 02/12/20   [provider]  RESTASIS 0.05 % ophthalmic emulsion Place 2 drops into both eyes 2 (two) times daily.  03/30/16   [provider]  tiZANidine (ZANAFLEX) 2 MG tablet  11/12/17   [provider]  TRAVATAN  Z 0.004 % SOLN ophthalmic solution Place 1 drop into both eyes at bedtime.  03/29/16   [provider]    Allergies    Aspirin and Morphine and related  Review of Systems   Review of Systems Ten systems reviewed and are negative for acute change, except as noted in the HPI.   Physical Exam Updated Vital Signs BP (!) 176/90 (BP Location: Left Arm)   Pulse 83   Temp 97.7 F (36.5 C) (Oral)   Resp 18   Ht 5' (1.524 m)   Wt 66 kg   SpO2 98%   BMI 28.42 kg/m   Physical Exam Vitals and nursing  note reviewed.  Constitutional:      General: She is not in acute distress.    Appearance: She is well-developed. She is not diaphoretic.  HENT:     Head: Normocephalic and atraumatic.  Eyes:     General: No scleral icterus.    Conjunctiva/sclera: Conjunctivae normal.  Cardiovascular:     Rate and Rhythm: Normal rate and regular rhythm.     Heart sounds: Normal heart sounds. No murmur heard. No friction rub. No gallop.   Pulmonary:     Effort: Pulmonary effort is normal. No respiratory distress.     Breath sounds: Normal breath sounds.  Abdominal:     General: Bowel sounds are normal. There is no distension.     Palpations: Abdomen is soft. There is no mass.     Tenderness: There is no abdominal tenderness. There is no guarding.  Musculoskeletal:     Cervical back: Normal range of motion.     Comments: Moves lower extremities without significant weakness.  Patient jumps in pain with light touch to any part of her body which makes assessment very difficult.  She is able to sit up.  No midline tenderness.  No CVA tenderness noted.  Skin:    General: Skin is warm and dry.  Neurological:     Mental Status: She is alert and oriented to person, place, and time.  Psychiatric:        Behavior: Behavior normal.     ED Results / Procedures / Treatments   Labs (all labs ordered are listed, but only abnormal results are displayed) Labs Reviewed - No data to  display  EKG None  Radiology No results found.  Procedures Procedures   Medications Ordered in ED Medications  oxyCODONE-acetaminophen (PERCOCET/ROXICET) 5-325 MG per tablet 1 tablet (1 tablet Oral Given 10/18/20 1343)  ondansetron (ZOFRAN-ODT) disintegrating tablet 4 mg (4 mg Oral Given 10/18/20 1343)    ED Course  I have reviewed the triage vital signs and the nursing notes.  Pertinent labs & imaging results that were available during my care of the patient were reviewed by me and considered in my medical decision making (see chart for details).    MDM Rules/Calculators/A&P                          87 year old female here with complaint of back pain after recent fall.  I ordered and reviewed CT abdomen and pelvis which show no acute abnormalities.  Patient has diminished pulses in the left lower extremity and pain may be secondary to her fall versus chronic claudication symptoms.  I discussed the findings with the patient's granddaughter at bedside.  Pain is significantly improved after pain medications.  She has had a consult with social work and I have placed home health and physical therapy outpatient orders.  Will discharge with pain medication after PDMP reviewed.  Patient is already on Colace and may continue with bowel regimen.  She appears otherwise appropriate for discharge with outpatient follow-up.  Seen and shared visit with Dr. Laverta Baltimore who agrees with work-up and plan for discharge at this time Final Clinical Impression(s) / ED Diagnoses Final diagnoses:  None    Rx / DC Orders ED Discharge Orders    None       Margarita Mail, PA-C 10/18/20 2324    Margette Fast, MD 10/19/20 1407

## 2020-10-18 NOTE — ED Notes (Signed)
Pt granddaughter asking for update, provider made aware

## 2020-10-18 NOTE — Discharge Instructions (Signed)
Contact a health care provider if: You have pain that: Wakes you up when you are sleeping. Gets worse when you lie down. Is worse than you have experienced in the past. Lasts longer than 4 weeks. You have an unexplained weight loss. Get help right away if: You are not able to control when you urinate or have bowel movements (incontinence). You have: Weakness in your lower back, pelvis, buttocks, or legs that gets worse. Redness or swelling of your back. A burning sensation when you urinate.

## 2020-10-18 NOTE — Progress Notes (Addendum)
.   Transition of Care [facility]) - Emergency Department Mini Assessment   Patient Details  Name: [name] MRN: [id number] Date of Birth: 03-12-1928  Transition of Care [facility]) CM/SW Contact:    Erenest Rasher, RN Phone Number: [phone] 10/18/2020, 8:31 PM   Clinical Narrative: TOC CM spoke, grand-dtr, [name]. States pt lives in a senior living apt alone, she has an aide that comes 2 hours per day with her [place] Medicaid PCS. Grand-dtr states she needs additional hours but having difficult getting a follow up with PCP. Offered choice for [facility]. Agreeable to [facility] agency that will accept referral. Referral sent to [facility] rep, Amy with new referral.  States pt has RW, bedside commode and wheelchair at home. States she or pt's dtr can stay with her at night. States she is interested in [place]. Will do referral for [facility] for assistance with placement at [facility]. Gdtr states she will need FL2 along with [place] Medicaid PCS form 3051 for additional hours. Faxed form 3051 and AVS to PCP's office requesting follow up appt and assistance with completing necessary paperwork for increase in hour. Gdtr states she is looking to get HCPOA. Discussed also with her the legal guardianship paperwork completed.    ED Mini Assessment: What brought you to the Emergency Department? : pain  Barriers to Discharge: No Barriers Identified  Barrier interventions: arranged Home Health, faxed AVS and [place] Medicaid PCS forms to PCP office  Means of departure: Car  Interventions which prevented an admission or readmission: [facility] or [facility] medical appointment    Patient Contact and Communications Key Contact 1: Elwyn Lade   Spoke with: granddaughter Contact Date: 10/18/20,   Contact time: 2029 Contact Phone Number: [phone]      CMS Medicare.gov Compare Post Acute Care list provided to:: Patient Represenative (must comment) (grand-dtr-[name] Picket) Choice offered to  / list presented to : Adult Children  Admission diagnosis:  left hip pain Patient Active Problem List   Diagnosis Date Noted  . Cough 02/15/2016  . Bilateral sensorineural hearing loss 12/22/2015  . Sepsis ([place]) 10/26/2015  . Vertigo 08/03/2015  . Full code status 05/10/2015  . Numbness-Left Heel and Toes  10/28/2014  . Sharp pain- Left Lateral Thigh and Right Stump 10/28/2014  . Cancer of lower lobe of left lung ([place]) 06/07/2013  . COPD GOLD I 04/18/2013  . Hemoptysis 04/10/2013  . Pain in joint, lower leg 03/19/2013  . Iliac artery stenosis, left ([place]) 03/13/2012  . Peripheral vascular disease, unspecified ([place]) 02/28/2012  . Chest pain 10/04/2011  . Hypertension 10/04/2011  . Hyperlipidemia 10/04/2011   PCP:  [name], MD Pharmacy:   [facility], [address] [place] [address] Phone: [phone] Fax: [facility], [place] [address] [place] [address] Phone: [phone] Fax: [phone]  [id number] - [place], [place] [address] [place] [address] Phone: [phone] Fax: [phone]

## 2020-10-29 ENCOUNTER — Encounter: Payer: Self-pay | Admitting: Psychology

## 2020-11-10 ENCOUNTER — Emergency Department (HOSPITAL_COMMUNITY): Payer: Medicare Other

## 2020-11-10 ENCOUNTER — Encounter (HOSPITAL_COMMUNITY): Payer: Self-pay | Admitting: Student

## 2020-11-10 ENCOUNTER — Other Ambulatory Visit: Payer: Self-pay

## 2020-11-10 ENCOUNTER — Inpatient Hospital Stay (HOSPITAL_COMMUNITY)
Admission: EM | Admit: 2020-11-10 | Discharge: 2020-11-20 | DRG: 315 | Disposition: A | Discharge status: Skilled Nursing Facility | Payer: Medicare Other | Attending: Internal Medicine | Admitting: Internal Medicine

## 2020-11-10 DIAGNOSIS — R778 Other specified abnormalities of plasma proteins: Secondary | ICD-10-CM | POA: Diagnosis present

## 2020-11-10 DIAGNOSIS — Z823 Family history of stroke: Secondary | ICD-10-CM

## 2020-11-10 DIAGNOSIS — R54 Age-related physical debility: Secondary | ICD-10-CM | POA: Diagnosis not present

## 2020-11-10 DIAGNOSIS — Z885 Allergy status to narcotic agent status: Secondary | ICD-10-CM

## 2020-11-10 DIAGNOSIS — R4182 Altered mental status, unspecified: Secondary | ICD-10-CM

## 2020-11-10 DIAGNOSIS — I10 Essential (primary) hypertension: Secondary | ICD-10-CM | POA: Diagnosis not present

## 2020-11-10 DIAGNOSIS — Z6828 Body mass index (BMI) 28.0-28.9, adult: Secondary | ICD-10-CM | POA: Diagnosis not present

## 2020-11-10 DIAGNOSIS — Z886 Allergy status to analgesic agent status: Secondary | ICD-10-CM

## 2020-11-10 DIAGNOSIS — I7090 Unspecified atherosclerosis: Secondary | ICD-10-CM | POA: Diagnosis not present

## 2020-11-10 DIAGNOSIS — Z7902 Long term (current) use of antithrombotics/antiplatelets: Secondary | ICD-10-CM

## 2020-11-10 DIAGNOSIS — Z8673 Personal history of transient ischemic attack (TIA), and cerebral infarction without residual deficits: Secondary | ICD-10-CM | POA: Diagnosis not present

## 2020-11-10 DIAGNOSIS — I70222 Atherosclerosis of native arteries of extremities with rest pain, left leg: Secondary | ICD-10-CM | POA: Diagnosis present

## 2020-11-10 DIAGNOSIS — Z89611 Acquired absence of right leg above knee: Secondary | ICD-10-CM

## 2020-11-10 DIAGNOSIS — J449 Chronic obstructive pulmonary disease, unspecified: Secondary | ICD-10-CM

## 2020-11-10 DIAGNOSIS — Z515 Encounter for palliative care: Secondary | ICD-10-CM | POA: Diagnosis not present

## 2020-11-10 DIAGNOSIS — Z20822 Contact with and (suspected) exposure to covid-19: Secondary | ICD-10-CM | POA: Diagnosis present

## 2020-11-10 DIAGNOSIS — Z66 Do not resuscitate: Secondary | ICD-10-CM | POA: Diagnosis not present

## 2020-11-10 DIAGNOSIS — I739 Peripheral vascular disease, unspecified: Secondary | ICD-10-CM

## 2020-11-10 DIAGNOSIS — Z923 Personal history of irradiation: Secondary | ICD-10-CM | POA: Diagnosis not present

## 2020-11-10 DIAGNOSIS — T82898A Other specified complication of vascular prosthetic devices, implants and grafts, initial encounter: Principal | ICD-10-CM | POA: Diagnosis present

## 2020-11-10 DIAGNOSIS — Z87891 Personal history of nicotine dependence: Secondary | ICD-10-CM | POA: Diagnosis not present

## 2020-11-10 DIAGNOSIS — Y831 Surgical operation with implant of artificial internal device as the cause of abnormal reaction of the patient, or of later complication, without mention of misadventure at the time of the procedure: Secondary | ICD-10-CM | POA: Diagnosis not present

## 2020-11-10 DIAGNOSIS — E440 Moderate protein-calorie malnutrition: Secondary | ICD-10-CM | POA: Insufficient documentation

## 2020-11-10 DIAGNOSIS — E785 Hyperlipidemia, unspecified: Secondary | ICD-10-CM | POA: Diagnosis present

## 2020-11-10 DIAGNOSIS — Z800 Family history of malignant neoplasm of digestive organs: Secondary | ICD-10-CM

## 2020-11-10 DIAGNOSIS — Z7951 Long term (current) use of inhaled steroids: Secondary | ICD-10-CM

## 2020-11-10 DIAGNOSIS — Z825 Family history of asthma and other chronic lower respiratory diseases: Secondary | ICD-10-CM | POA: Diagnosis not present

## 2020-11-10 DIAGNOSIS — Z79899 Other long term (current) drug therapy: Secondary | ICD-10-CM

## 2020-11-10 DIAGNOSIS — K551 Chronic vascular disorders of intestine: Secondary | ICD-10-CM | POA: Diagnosis not present

## 2020-11-10 DIAGNOSIS — Z85118 Personal history of other malignant neoplasm of bronchus and lung: Secondary | ICD-10-CM

## 2020-11-10 DIAGNOSIS — Z7189 Other specified counseling: Secondary | ICD-10-CM

## 2020-11-10 DIAGNOSIS — I998 Other disorder of circulatory system: Secondary | ICD-10-CM

## 2020-11-10 LAB — COMPREHENSIVE METABOLIC PANEL
ALT: 23 U/L (ref 0–44)
AST: 32 U/L (ref 15–41)
Albumin: 3.5 g/dL (ref 3.5–5.0)
Alkaline Phosphatase: 46 U/L (ref 38–126)
Anion gap: 13 (ref 5–15)
BUN: 19 mg/dL (ref 8–23)
CO2: 23 mmol/L (ref 22–32)
Calcium: 9.4 mg/dL (ref 8.9–10.3)
Chloride: 98 mmol/L (ref 98–111)
Creatinine, Ser: 0.97 mg/dL (ref 0.44–1.00)
GFR, Estimated: 56 mL/min — ABNORMAL LOW (ref >60)
Glucose, Bld: 80 mg/dL (ref 70–99)
Potassium: 4.7 mmol/L (ref 3.5–5.1)
Sodium: 134 mmol/L — ABNORMAL LOW (ref 135–145)
Total Bilirubin: 1.1 mg/dL (ref 0.3–1.2)
Total Protein: 7.2 g/dL (ref 6.5–8.1)

## 2020-11-10 LAB — CBC WITH DIFFERENTIAL/PLATELET
Abs Immature Granulocytes: 0.04 10*3/uL (ref 0.00–0.07)
Basophils Absolute: 0 10*3/uL (ref 0.0–0.1)
Basophils Relative: 1 %
Eosinophils Absolute: 0 10*3/uL (ref 0.0–0.5)
Eosinophils Relative: 0 %
HCT: 45.5 % (ref 36.0–46.0)
Hemoglobin: 15.2 g/dL — ABNORMAL HIGH (ref 12.0–15.0)
Immature Granulocytes: 1 %
Lymphocytes Relative: 19 %
Lymphs Abs: 1.4 10*3/uL (ref 0.7–4.0)
MCH: 31.1 pg (ref 26.0–34.0)
MCHC: 33.4 g/dL (ref 30.0–36.0)
MCV: 93 fL (ref 80.0–100.0)
Monocytes Absolute: 0.6 10*3/uL (ref 0.1–1.0)
Monocytes Relative: 8 %
Neutro Abs: 5.2 10*3/uL (ref 1.7–7.7)
Neutrophils Relative %: 71 %
Platelets: 337 10*3/uL (ref 150–400)
RBC: 4.89 MIL/uL (ref 3.87–5.11)
RDW: 14.3 % (ref 11.5–15.5)
WBC: 7.2 10*3/uL (ref 4.0–10.5)
nRBC: 0 % (ref 0.0–0.2)

## 2020-11-10 LAB — URINALYSIS, ROUTINE W REFLEX MICROSCOPIC
Bilirubin Urine: NEGATIVE
Glucose, UA: NEGATIVE mg/dL
Hgb urine dipstick: NEGATIVE
Ketones, ur: 5 mg/dL — AB
Leukocytes,Ua: NEGATIVE
Nitrite: NEGATIVE
Protein, ur: NEGATIVE mg/dL
Specific Gravity, Urine: >1.046 — ABNORMAL HIGH (ref 1.005–1.030)
pH: 5 (ref 5.0–8.0)

## 2020-11-10 LAB — LACTIC ACID, PLASMA
Lactic Acid, Venous: 1.9 mmol/L (ref 0.5–1.9)
Lactic Acid, Venous: 1.5 mmol/L (ref 0.5–1.9)

## 2020-11-10 LAB — PROTIME-INR
INR: 1 (ref 0.8–1.2)
Prothrombin Time: 13.6 seconds (ref 11.4–15.2)

## 2020-11-10 LAB — HEPARIN LEVEL (UNFRACTIONATED): Heparin Unfractionated: 0.58 IU/mL (ref 0.30–0.70)

## 2020-11-10 LAB — RESP PANEL BY RT-PCR (FLU A&B, COVID) ARPGX2
Influenza A by PCR: NEGATIVE
Influenza B by PCR: NEGATIVE
SARS Coronavirus 2 by RT PCR: NEGATIVE

## 2020-11-10 LAB — APTT: aPTT: 34 seconds (ref 24–36)

## 2020-11-10 LAB — TROPONIN I (HIGH SENSITIVITY)
Troponin I (High Sensitivity): 38 ng/L — ABNORMAL HIGH (ref <18)
Troponin I (High Sensitivity): 36 ng/L — ABNORMAL HIGH (ref <18)

## 2020-11-10 MED ORDER — GABAPENTIN 100 MG PO CAPS
100.0000 mg | ORAL_CAPSULE | Freq: Three times a day (TID) | ORAL | Status: DC
  Administered 2020-11-10 – 2020-11-12 (×5): 100 mg via ORAL
  Filled 2020-11-10 (×5): qty 1

## 2020-11-10 MED ORDER — ACETAMINOPHEN 650 MG RE SUPP: 650.0000 mg | Freq: Four times a day (QID) | RECTAL | Status: DC | PRN

## 2020-11-10 MED ORDER — ONDANSETRON HCL 4 MG/2ML IJ SOLN
4.0000 mg | Freq: Four times a day (QID) | INTRAMUSCULAR | Status: DC | PRN
  Administered 2020-11-13: 4 mg via INTRAVENOUS
  Filled 2020-11-10: qty 2

## 2020-11-10 MED ORDER — ALBUTEROL SULFATE HFA 108 (90 BASE) MCG/ACT IN AERS
2.0000 | INHALATION_SPRAY | Freq: Four times a day (QID) | RESPIRATORY_TRACT | Status: DC | PRN
  Filled 2020-11-10: qty 6.7

## 2020-11-10 MED ORDER — CYCLOSPORINE 0.05 % OP EMUL
2.0000 [drp] | Freq: Two times a day (BID) | OPHTHALMIC | Status: DC
  Administered 2020-11-10 – 2020-11-20 (×19): 2 [drp] via OPHTHALMIC
  Filled 2020-11-10 (×21): qty 1

## 2020-11-10 MED ORDER — PANTOPRAZOLE SODIUM 40 MG PO TBEC
40.0000 mg | DELAYED_RELEASE_TABLET | Freq: Every day | ORAL | Status: DC
  Administered 2020-11-10 – 2020-11-20 (×11): 40 mg via ORAL
  Filled 2020-11-10 (×11): qty 1

## 2020-11-10 MED ORDER — MECLIZINE HCL 25 MG PO TABS
25.0000 mg | ORAL_TABLET | Freq: Three times a day (TID) | ORAL | Status: DC | PRN
  Filled 2020-11-10: qty 1

## 2020-11-10 MED ORDER — HEPARIN (PORCINE) 25000 UT/250ML-% IV SOLN
850.0000 [IU]/h | INTRAVENOUS | Status: DC
  Administered 2020-11-10 – 2020-11-13 (×3): 800 [IU]/h via INTRAVENOUS
  Administered 2020-11-17: 850 [IU]/h via INTRAVENOUS
  Administered 2020-11-18 – 2020-11-19 (×2): 950 [IU]/h via INTRAVENOUS
  Filled 2020-11-10 (×8): qty 250

## 2020-11-10 MED ORDER — VITAMIN D 25 MCG (1000 UNIT) PO TABS
1000.0000 [IU] | ORAL_TABLET | Freq: Every day | ORAL | Status: DC
  Administered 2020-11-10 – 2020-11-20 (×11): 1000 [IU] via ORAL
  Filled 2020-11-10 (×11): qty 1

## 2020-11-10 MED ORDER — PRAVASTATIN SODIUM 10 MG PO TABS
20.0000 mg | ORAL_TABLET | Freq: Every day | ORAL | Status: DC
  Administered 2020-11-10 – 2020-11-19 (×8): 20 mg via ORAL
  Filled 2020-11-10 (×10): qty 2

## 2020-11-10 MED ORDER — ONDANSETRON HCL 4 MG PO TABS: 4.0000 mg | ORAL_TABLET | Freq: Four times a day (QID) | ORAL | Status: DC | PRN

## 2020-11-10 MED ORDER — MOMETASONE FURO-FORMOTEROL FUM 200-5 MCG/ACT IN AERO
2.0000 | INHALATION_SPRAY | Freq: Two times a day (BID) | RESPIRATORY_TRACT | Status: DC
  Administered 2020-11-10 – 2020-11-20 (×12): 2 via RESPIRATORY_TRACT
  Filled 2020-11-10: qty 8.8

## 2020-11-10 MED ORDER — ACETAMINOPHEN 325 MG PO TABS
650.0000 mg | ORAL_TABLET | Freq: Four times a day (QID) | ORAL | Status: DC | PRN
  Administered 2020-11-16: 650 mg via ORAL
  Filled 2020-11-10: qty 2

## 2020-11-10 MED ORDER — LATANOPROST 0.005 % OP SOLN
1.0000 [drp] | Freq: Every day | OPHTHALMIC | Status: DC
  Administered 2020-11-10 – 2020-11-19 (×9): 1 [drp] via OPHTHALMIC
  Filled 2020-11-10: qty 2.5

## 2020-11-10 MED ORDER — IOHEXOL 350 MG/ML SOLN
100.0000 mL | Freq: Once | INTRAVENOUS | Status: AC | PRN
  Administered 2020-11-10: 100 mL via INTRAVENOUS

## 2020-11-10 MED ORDER — LOSARTAN POTASSIUM 50 MG PO TABS
50.0000 mg | ORAL_TABLET | Freq: Every day | ORAL | Status: DC
  Administered 2020-11-10 – 2020-11-13 (×4): 50 mg via ORAL
  Filled 2020-11-10 (×4): qty 1

## 2020-11-10 MED ORDER — OXYCODONE HCL 5 MG PO TABS
2.5000 mg | ORAL_TABLET | Freq: Four times a day (QID) | ORAL | Status: DC | PRN
  Administered 2020-11-10 – 2020-11-12 (×4): 5 mg via ORAL
  Administered 2020-11-12: 2.5 mg via ORAL
  Filled 2020-11-10 (×6): qty 1

## 2020-11-10 MED ORDER — AMLODIPINE BESYLATE 5 MG PO TABS
5.0000 mg | ORAL_TABLET | Freq: Every day | ORAL | Status: DC
  Administered 2020-11-10 – 2020-11-13 (×4): 5 mg via ORAL
  Filled 2020-11-10 (×4): qty 1

## 2020-11-10 MED ORDER — OXYCODONE HCL 5 MG PO TABS
5.0000 mg | ORAL_TABLET | Freq: Once | ORAL | Status: AC
  Administered 2020-11-10: 5 mg via ORAL
  Filled 2020-11-10: qty 1

## 2020-11-10 MED ORDER — ALBUTEROL SULFATE (2.5 MG/3ML) 0.083% IN NEBU: 2.5000 mg | INHALATION_SOLUTION | Freq: Four times a day (QID) | RESPIRATORY_TRACT | Status: DC | PRN

## 2020-11-10 MED ORDER — HEPARIN BOLUS VIA INFUSION
4000.0000 [IU] | Freq: Once | INTRAVENOUS | Status: AC
  Administered 2020-11-10: 4000 [IU] via INTRAVENOUS
  Filled 2020-11-10: qty 4000

## 2020-11-10 MED ORDER — ACETAMINOPHEN 500 MG PO TABS
1000.0000 mg | ORAL_TABLET | Freq: Once | ORAL | Status: AC
  Administered 2020-11-10: 1000 mg via ORAL
  Filled 2020-11-10: qty 2

## 2020-11-10 MED ORDER — TIZANIDINE HCL 4 MG PO TABS
2.0000 mg | ORAL_TABLET | Freq: Two times a day (BID) | ORAL | Status: DC | PRN
  Administered 2020-11-11: 2 mg via ORAL
  Filled 2020-11-10: qty 1

## 2020-11-10 NOTE — ED Provider Notes (Addendum)
[facility] DEPT Provider Note   CSN: [id number] Arrival date & time: 11/10/20  0915     History Chief Complaint  Patient presents with  . Weakness  . Foot Pain    [name] is a 87 y.o. female.  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  Patient has home aide.  They reportedly found her in the same spot for the last 24 hours with worsening redness and discoloration the left foot and called an ambulance to send her here.  Of note patient is very hard of hearing so history is somewhat limited.  Patient is able to tell me that she is having extreme pain in the left foot and otherwise feels "unwell".  There is no family at bedside.  She reportedly ambulates with a walker with a right prosthetic leg.  Past Medical History:  Diagnosis Date  . Arthritis   . Cancer ([place])    rt lung  . COPD GOLD I 04/18/2013  . Glaucoma   . Hyperlipidemia   . Hypertension   . Lung cancer ([place]) 06/07/2013  . Peripheral vascular disease, unspecified ([place])    with Claudication  . S/P radiation therapy 12/12, 12/15, and 07/19/15   The LLL nodule was treated to 54 Gy in 3 fractions of 18 Gy  . Shortness of breath   . Stroke [facility])     Patient Active Problem List   Diagnosis Date Noted  . Cough 02/15/2016  . Bilateral sensorineural hearing loss 12/22/2015  . Sepsis ([place]) 10/26/2015  . Vertigo 08/03/2015  . Full code status 05/10/2015  . Numbness-Left Heel and Toes  10/28/2014  . Sharp pain- Left Lateral Thigh and Right Stump 10/28/2014  . Cancer of lower lobe of left lung ([place]) 06/07/2013  . COPD GOLD I 04/18/2013  . Hemoptysis 04/10/2013  . Pain in  joint, lower leg 03/19/2013  . Iliac artery stenosis, left ([place]) 03/13/2012  . Peripheral vascular disease, unspecified ([place]) 02/28/2012  . Chest pain 10/04/2011  . Hypertension 10/04/2011  . Hyperlipidemia 10/04/2011    Past Surgical History:  Procedure Laterality Date  . ABOVE KNEE LEG AMPUTATION     Right  . ANGIOPLASTY / STENTING ILIAC  03/22/09   Aortogram-  left common iliac artery  . COLONOSCOPY  Feb. 2013  . HEMORRHOID SURGERY    . TONSILLECTOMY    . VIDEO ASSISTED THORACOSCOPY (VATS)/WEDGE RESECTION Right 06/16/2013   Procedure: VIDEO ASSISTED THORACOSCOPY (VATS)/WEDGE RESECTION;  Surgeon: [name], MD;  Location: [facility];  Service: Thoracic;  Laterality: Right;  Marland Kitchen VIDEO BRONCHOSCOPY Bilateral 04/10/2013   Procedure: VIDEO BRONCHOSCOPY WITH FLUORO;  Surgeon: Tanda Rockers, MD;  [facility];  Service: Cardiopulmonary;  Laterality: Bilateral;  . VIDEO BRONCHOSCOPY N/A 06/16/2013   Procedure: VIDEO BRONCHOSCOPY;  Surgeon: [name], MD;  [facility];  Service: Thoracic;  Laterality: N/A;     OB History   No obstetric history on file.     Family History  Problem Relation Age of Onset  . Stomach cancer Mother   . Stroke Brother   . Asthma Sister   . Colon cancer Brother     Social History   Tobacco  Use  . Smoking status: Former Smoker    Packs/day: 0.00    Years: 60.00    Pack years: 0.00    Types: Cigarettes    Quit date: 07/31/2009    Years since quitting: 11.2  . Smokeless tobacco: Never Used  Vaping Use  . Vaping Use: Never used  Substance Use Topics  . Alcohol use: No  . Drug use: No    Home Medications Prior to Admission medications   Medication Sig Start Date End Date Taking? Authorizing Provider  albuterol (PROVENTIL) (2.5 MG/3ML) 0.083% nebulizer solution Take by nebulization. 10/15/19   [provider]  amLODipine (NORVASC) 2.5 MG tablet Take 2.5 mg by mouth daily.  06/23/15   [provider]   amLODipine (NORVASC) 5 MG tablet Take 5 mg by mouth daily. 07/26/20   [provider]  budesonide-formoterol (SYMBICORT) 160-4.5 MCG/ACT inhaler Inhale 2 puffs into the lungs 2 (two) times daily. 11/03/17   Quintella Reichert, MD  Cholecalciferol (VITAMIN D3) 1000 units CAPS Take 1 capsule by mouth daily.    [provider]  clopidogrel (PLAVIX) 75 MG tablet Take 1 tablet (75 mg total) by mouth every Monday, Wednesday, and Friday. 10/01/17   Clent Demark, PA-C  DELSYM 30 MG/5ML liquid Take 10 mLs by mouth 2 (two) times daily as needed. 11/05/19   [provider]  diclofenac sodium (VOLTAREN) 1 % GEL  08/15/18   [provider]  docusate sodium (COLACE) 50 MG capsule Take 100 mg by mouth as needed for mild constipation.    [provider]  gabapentin (NEURONTIN) 100 MG capsule Take 1 capsule (100 mg total) by mouth 3 (three) times daily. 09/19/17   Clent Demark, PA-C  losartan (COZAAR) 50 MG tablet  05/15/17   [provider]  LUMIGAN 0.01 % SOLN SMARTSIG:1 Drop(s) In Eye(s) Every Evening 02/03/20   [provider]  meclizine (ANTIVERT) 25 MG tablet Take 25 mg by mouth every 8 (eight) hours as needed for dizziness (dizziness).     [provider]  nitroGLYCERIN (NITROSTAT) 0.4 MG SL tablet SMARTSIG:1 Tablet(s) Sublingual PRN 01/29/20   [provider]  omeprazole (PRILOSEC) 20 MG capsule  11/12/17   [provider]  oxyCODONE (ROXICODONE) 5 MG immediate release tablet Take 0.5-1 tablets (2.5-5 mg total) by mouth every 6 (six) hours as needed for severe pain. 10/18/20   Margarita Mail, PA-C  pravastatin (PRAVACHOL) 20 MG tablet  05/04/17   [provider]  predniSONE (DELTASONE) 20 MG tablet Take by mouth. 10/15/19   [provider]  PROAIR HFA 108 (90 Base) MCG/ACT inhaler Inhale 2 puffs into the lungs every 6 (six) hours as needed. 02/12/20   [provider]  RESTASIS 0.05 % ophthalmic  emulsion Place 2 drops into both eyes 2 (two) times daily.  03/30/16   [provider]  tiZANidine (ZANAFLEX) 2 MG tablet  11/12/17   [provider]  TRAVATAN Z 0.004 % SOLN ophthalmic solution Place 1 drop into both eyes at bedtime.  03/29/16   [provider]    Allergies    Aspirin and Morphine and related  Review of Systems   Review of Systems  Reason unable to perform ROS: Limited secondary to patient being very hard of hearing.  Constitutional: Positive for appetite change and fatigue.  Cardiovascular: Negative for chest pain.  Musculoskeletal:       + Left foot pain  Neurological: Negative for headaches.    Physical Exam  Updated Vital Signs BP (!) 160/72   Pulse 84   Temp (!) 100.4 F (38 C) (Rectal)   Resp (!) 24   Ht 5' (1.524 m)   Wt 67 kg   SpO2 97%   BMI 28.85 kg/m   Physical Exam Vitals and nursing note reviewed.  Constitutional:      Appearance: Normal appearance.  HENT:     Head: Normocephalic.     Mouth/Throat:     Mouth: Mucous membranes are moist.  Cardiovascular:     Rate and Rhythm: Normal rate.  Pulmonary:     Effort: Pulmonary effort is normal. No respiratory distress.  Abdominal:     Palpations: Abdomen is soft.     Tenderness: There is no abdominal tenderness.  Musculoskeletal:     Comments: Right AKA, left foot is erythematous, slightly swollen, 2-4 toe bluish discoloration, no dopplerable left foot pulses, painful to the touch  Skin:    General: Skin is warm.  Neurological:     Mental Status: She is alert and oriented to person, place, and time. Mental status is at baseline.     Comments: Very HOH, but appears to have appropriate answers and follow commands when I speak loud enough     ED Results / Procedures / Treatments   Labs (all labs ordered are listed, but only abnormal results are displayed) Labs Reviewed  CULTURE, BLOOD (ROUTINE X 2)  CULTURE, BLOOD (ROUTINE X 2)  CBC WITH DIFFERENTIAL/PLATELET   COMPREHENSIVE METABOLIC PANEL  LACTIC ACID, PLASMA  LACTIC ACID, PLASMA  URINALYSIS, ROUTINE W REFLEX MICROSCOPIC    EKG None  Radiology No results found.  Procedures .Critical Care Performed by: Lorelle Gibbs, DO Authorized by: Lorelle Gibbs, DO   Critical care provider statement:    Critical care time (minutes):  45   Critical care was necessary to treat or prevent imminent or life-threatening deterioration of the following conditions:  Circulatory failure   Critical care was time spent personally by me on the following activities:  Discussions with consultants, evaluation of patient's response to treatment, examination of patient, ordering and performing treatments and interventions, ordering and review of laboratory studies, ordering and review of radiographic studies, pulse oximetry, re-evaluation of patient's condition, obtaining history from patient or surrogate and review of old charts     Medications Ordered in ED Medications - No data to display  ED Course  I have reviewed the triage vital signs and the nursing notes.  Pertinent labs & imaging results that were available during my care of the patient were reviewed by me and considered in my medical decision making (see chart for details).    MDM Rules/Calculators/A&P                          87 year old female presents the emergency department with concern for left foot discoloration and pain.  She is febrile, tachypneic.  Concern for possible sepsis as well.  Unable to get Doppler pulses in the left foot, she has known infrainguinal arterial occlusion disease.  Plan for sepsis work-up and vascular imaging of the lower extremity to evaluate for progression of occlusive disease.  Blood work shows a normal white count and lactic acid, urinalysis is unremarkable, Covid and flu are negative.  CT angio of the lower extremity shows occlusion of the iliac stents with other occlusive disease.  Spoke with on-call  vascular surgeon Dr. Carlis Abbott who is reviewed the imaging.  Concern for  acute on chronic occlusive disease.  Recommends heparin and transfer to [facility] under medicine service for further evaluation and treatment.  Patient and daughter updated.  Patients evaluation and results requires admission for further treatment and care. Patient agrees with admission plan, offers no new complaints and is stable/unchanged at time of admit.  Final Clinical Impression(s) / ED Diagnoses Final diagnoses:  None    Rx / DC Orders ED Discharge Orders    None       Lorelle Gibbs, DO 11/10/20 1540    Madisyn [place], Alvin Critchley, DO 12/13/20 2008

## 2020-11-10 NOTE — Progress Notes (Signed)
ANTICOAGULATION CONSULT NOTE - Initial Consult  Pharmacy Consult for Heparin Indication: PVD, possible ischemic foot  Allergies  Allergen Reactions  . Aspirin Anaphylaxis  . Morphine And Related Other (See Comments)    confusion    Patient Measurements: Height: 5' (152.4 cm) Weight: 67 kg (147 lb 11.3 oz) IBW/kg (Calculated) : 45.5 Heparin Dosing Weight: 59.9 kg  Vital Signs: Temp: 98.9 F (37.2 C) (04/13 1353) Temp Source: Oral (04/13 1353) BP: 121/53 (04/13 1430) Pulse Rate: 80 (04/13 1430)  Labs: Recent Labs    11/10/20 1027 11/10/20 1241  HGB 15.2*  --   HCT 45.5  --   PLT 337  --   APTT 34  --   LABPROT 13.6  --   INR 1.0  --   CREATININE 0.97  --   TROPONINIHS 38* 36*    Estimated Creatinine Clearance: 32.9 mL/min (by C-G formula based on SCr of 0.97 mg/dL).   Medical History: Past Medical History:  Diagnosis Date  . Arthritis   . Cancer ([place])    rt lung  . COPD GOLD I 04/18/2013  . Glaucoma   . Hyperlipidemia   . Hypertension   . Lung cancer ([place]) 06/07/2013  . Peripheral vascular disease, unspecified ([place])    with Claudication  . S/P radiation therapy 12/12, 12/15, and 07/19/15   The LLL nodule was treated to 54 Gy in 3 fractions of 18 Gy  . Shortness of breath   . Stroke [facility])     Medications:  Scheduled:   Infusions:  . heparin 800 Units/hr (11/10/20 1450)   PRN:   Assessment: 87 yo female with hx of PVD s/p amputation of RLE and L common iliac stenting admitted with L leg pain.  CT shows occlusions of the left common iliac and SFA.  Pharmacy is consulted to dose IV heparin.  Baseline coags and CBC wnl, takes plavix.  Goal of Therapy:  Heparin level 0.3-0.7 units/ml Monitor platelets by anticoagulation protocol: Yes   Plan:  Heparin 4000 units IV bolus + 800 units/hr IV infusion started in ED ~3p today Will check check heparin level 8hrs after starting heparin Daily heparin level and CBC Monitor closely for signs/symptoms of  bleeding  Peggyann Juba, PharmD, BCPS Pharmacy: [phone] 11/10/2020,4:07 PM

## 2020-11-10 NOTE — H&P (Signed)
History and Physical    [name] YWV:[id number] DOB: 08-15-1927 DOA: 11/10/2020  PCP: [name], MD  Patient coming from: Home  Chief Complaint: Left leg pain  HPI: [name] is a 87 y.o. female with medical history significant of HTN, PVD, PAOD, HLD. Presenting with left leg pain. Hx from granddtr at bedside. Her granddtr reports that the patient was in her normal state of health until 5 days ago. She notes that the patient was having trouble transferring from the toilet. She did not complain of any other symptoms over the weekend. However, 2 days ago (Monday) she was not as mobile as she is normally. She wanted to sit and not use her walker. Yesterday she did not want to leave her chair and she was actively complaining of pain in her foot. This morning she was evaluated by the home health nurse. Her foot was found to be red and swollen. Her toes appeared dusky. She had the patient transported to the ED for assistance. The patient denies any other aggravating or alleviating factors.  ED Course: She was found to have occlusions of the left common iliac and SFA. EDP spoke with Vascular surgery (Dr. Carlis Abbott). The patient was started on heparin gtt w/ rec to be admitted to [facility]. [facility] was called for admission.   Review of Systems:  Denies CP, dyspnea, palpitations, syncopal episodes, N/V/D. Review of systems is otherwise negative for all not mentioned in HPI.   PMHx Past Medical History:  Diagnosis Date  . Arthritis   . Cancer ([place])    rt lung  . COPD GOLD I 04/18/2013  . Glaucoma   . Hyperlipidemia   . Hypertension   . Lung cancer ([place]) 06/07/2013  . Peripheral vascular disease, unspecified ([place])    with Claudication  . S/P radiation therapy 12/12, 12/15, and 07/19/15   The LLL nodule was treated to 54 Gy in 3 fractions of 18 Gy  . Shortness of breath   . Stroke [facility])     PSHx Past Surgical History:  Procedure Laterality Date  . ABOVE KNEE LEG AMPUTATION     Right  .  ANGIOPLASTY / STENTING ILIAC  03/22/09   Aortogram-  left common iliac artery  . COLONOSCOPY  Feb. 2013  . HEMORRHOID SURGERY    . TONSILLECTOMY    . VIDEO ASSISTED THORACOSCOPY (VATS)/WEDGE RESECTION Right 06/16/2013   Procedure: VIDEO ASSISTED THORACOSCOPY (VATS)/WEDGE RESECTION;  Surgeon: [name], MD;  Location: [place];  Service: Thoracic;  Laterality: Right;  Marland Kitchen VIDEO BRONCHOSCOPY Bilateral 04/10/2013   Procedure: VIDEO BRONCHOSCOPY WITH FLUORO;  Surgeon: Tanda Rockers, MD;  Location: [facility];  Service: Cardiopulmonary;  Laterality: Bilateral;  . VIDEO BRONCHOSCOPY N/A 06/16/2013   Procedure: VIDEO BRONCHOSCOPY;  Surgeon: [name], MD;  [facility];  Service: Thoracic;  Laterality: N/A;    SocHx  reports that she quit smoking about 11 years ago. Her smoking use included cigarettes. She smoked 0.00 packs per day for 60.00 years. She has never used smokeless tobacco. She reports that she does not drink alcohol and does not use drugs.  Allergies  Allergen Reactions  . Aspirin Anaphylaxis  . Morphine And Related Other (See Comments)    confusion    FamHx Family History  Problem Relation Age of Onset  . Stomach cancer Mother   . Stroke Brother   . Asthma Sister   . Colon cancer Brother     Prior to Admission medications  Medication Sig Start Date End Date Taking? Authorizing Provider  albuterol (PROVENTIL) (2.5 MG/3ML) 0.083% nebulizer solution Take 2.5 mg by nebulization every 6 (six) hours as needed for wheezing or shortness of breath. 10/15/19  Yes [provider]  amLODipine (NORVASC) 5 MG tablet Take 5 mg by mouth daily. 07/26/20  Yes [provider]  budesonide-formoterol (SYMBICORT) 160-4.5 MCG/ACT inhaler Inhale 2 puffs into the lungs 2 (two) times daily. 11/03/17  Yes Quintella Reichert, MD  Cholecalciferol (VITAMIN D3) 1000 units CAPS Take 1,000 capsules by mouth daily.   Yes [provider]  clopidogrel (PLAVIX) 75 MG  tablet Take 1 tablet (75 mg total) by mouth every Monday, Wednesday, and Friday. 10/01/17  Yes Clent Demark, PA-C  diclofenac sodium (VOLTAREN) 1 % GEL Apply 2 g topically 4 (four) times daily. 08/15/18  Yes [provider]  gabapentin (NEURONTIN) 100 MG capsule Take 1 capsule (100 mg total) by mouth 3 (three) times daily. 09/19/17  Yes Clent Demark, PA-C  losartan (COZAAR) 50 MG tablet Take 50 mg by mouth daily. 05/15/17  Yes [provider]  LUMIGAN 0.01 % SOLN Place 1 drop into both eyes at bedtime. 02/03/20  Yes [provider]  meclizine (ANTIVERT) 25 MG tablet Take 25 mg by mouth every 8 (eight) hours as needed for dizziness (dizziness).    Yes [provider]  omeprazole (PRILOSEC) 40 MG capsule Take 40 mg by mouth daily.   Yes [provider]  oxyCODONE (ROXICODONE) 5 MG immediate release tablet Take 0.5-1 tablets (2.5-5 mg total) by mouth every 6 (six) hours as needed for severe pain. 10/18/20  Yes Harris, Abigail, PA-C  pravastatin (PRAVACHOL) 20 MG tablet Take 20 mg by mouth at bedtime. 05/04/17  Yes [provider]  PROAIR HFA 108 (90 Base) MCG/ACT inhaler Inhale 2 puffs into the lungs every 6 (six) hours as needed. 02/12/20  Yes [provider]  RESTASIS 0.05 % ophthalmic emulsion Place 2 drops into both eyes 2 (two) times daily.  03/30/16  Yes [provider]  tiZANidine (ZANAFLEX) 2 MG tablet Take 2 mg by mouth 2 (two) times daily as needed for muscle spasms. 11/12/17  Yes [provider]  TRAVATAN Z 0.004 % SOLN ophthalmic solution Place 1 drop into both eyes at bedtime.  03/29/16  Yes [provider]    Physical Exam: Vitals:   11/10/20 1330 11/10/20 1353 11/10/20 1400 11/10/20 1430  BP: 130/75  (!) 110/59 (!) 121/53  Pulse: 76  75 80  Resp: 20  20 18   Temp:  98.9 F (37.2 C)    TempSrc:  Oral    SpO2: 98%  98% 99%  Weight:      Height:        General: 87 y.o. female resting in bed  in NAD Eyes: PERRL, normal sclera ENMT: Nares patent w/o discharge, orophaynx clear, dentition normal, ears w/o discharge/lesions/ulcers Neck: Supple, trachea midline Cardiovascular: RRR, +S1, S2, no g/r, 1/6 SEM, equal pulses throughout Respiratory: CTABL, no w/r/r, normal WOB GI: BS+, NDNT, no masses noted, no organomegaly noted MSK: No c/c; RAKA. L foot w/erythema, pitting edema at the ankle; TTP  Skin: No rashes, bruises, ulcerations noted Neuro: A&O x 2 (name, place), Hard of hearing, otherwise no focal deficits Psyc: Appropriate interaction and affect, calm/cooperative  Labs on Admission: I have personally reviewed following labs and imaging studies  CBC: Recent Labs  Lab 11/10/20 1027  WBC 7.2  NEUTROABS 5.2  HGB 15.2*  HCT 45.5  MCV 93.0  PLT 983   Basic Metabolic Panel: Recent Labs  Lab 11/10/20 1027  NA 134*  K 4.7  CL 98  CO2 23  GLUCOSE 80  BUN 19  CREATININE 0.97  CALCIUM 9.4   GFR: Estimated Creatinine Clearance: 32.9 mL/min (by C-G formula based on SCr of 0.97 mg/dL). Liver Function Tests: Recent Labs  Lab 11/10/20 1027  AST 32  ALT 23  ALKPHOS 46  BILITOT 1.1  PROT 7.2  ALBUMIN 3.5   No results for input(s): LIPASE, AMYLASE in the last 168 hours. No results for input(s): AMMONIA in the last 168 hours. Coagulation Profile: Recent Labs  Lab 11/10/20 1027  INR 1.0   Cardiac Enzymes: No results for input(s): CKTOTAL, CKMB, CKMBINDEX, TROPONINI in the last 168 hours. BNP (last 3 results) No results for input(s): PROBNP in the last 8760 hours. HbA1C: No results for input(s): HGBA1C in the last 72 hours. CBG: No results for input(s): GLUCAP in the last 168 hours. Lipid Profile: No results for input(s): CHOL, HDL, LDLCALC, TRIG, CHOLHDL, LDLDIRECT in the last 72 hours. Thyroid Function Tests: No results for input(s): TSH, T4TOTAL, FREET4, T3FREE, THYROIDAB in the last 72 hours. Anemia Panel: No results for input(s): VITAMINB12, FOLATE,  FERRITIN, TIBC, IRON, RETICCTPCT in the last 72 hours. Urine analysis:    Component Value Date/Time   COLORURINE YELLOW 11/10/2020 1337   APPEARANCEUR CLEAR 11/10/2020 1337   LABSPEC >1.046 (H) 11/10/2020 1337   PHURINE 5.0 11/10/2020 1337   GLUCOSEU NEGATIVE 11/10/2020 1337   HGBUR NEGATIVE 11/10/2020 1337   BILIRUBINUR NEGATIVE 11/10/2020 1337   KETONESUR 5 (A) 11/10/2020 1337   PROTEINUR NEGATIVE 11/10/2020 1337   UROBILINOGEN 1.0 06/12/2013 1507   NITRITE NEGATIVE 11/10/2020 1337   LEUKOCYTESUR NEGATIVE 11/10/2020 1337    Radiological Exams on Admission: CT Angio Aortobifemoral W and/or Wo Contrast  Result Date: 11/10/2020 CLINICAL DATA:  Left foot pain and redness. EXAM: CT ANGIOGRAPHY OF ABDOMINAL AORTA WITH ILIOFEMORAL RUNOFF TECHNIQUE: Multidetector CT imaging of the abdomen, pelvis and lower extremities was performed using the standard protocol during bolus administration of intravenous contrast. Multiplanar CT image reconstructions and MIPs were obtained to evaluate the vascular anatomy. CONTRAST:  124mL OMNIPAQUE IOHEXOL 350 MG/ML SOLN COMPARISON:  CT without contrast 11/07/2016 FINDINGS: VASCULAR Aorta: Diffuse atherosclerotic disease in the abdominal aorta without aneurysm or dissection. Mild narrowing in the distal abdominal aorta. Celiac: Origin of the celiac trunk is occluded. There is flow in the distal celiac trunk which is likely retrograde flow from the SMA and pancreatic-duodenal branches. GDA is prominent. SMA: SMA is patent. Prominent pancreatic-duodenal branches. Atherosclerotic disease in the SMA. Renals: Atherosclerotic disease involving the bilateral renal arteries and difficult to exclude renal artery stenosis particularly at the origin of the right renal artery. No evidence for renal artery aneurysm or dissection. IMA: Patent RIGHT Lower Extremity Inflow: Large amount of calcified plaque at the aortic bifurcation and origin of the right common iliac artery. Right  common iliac artery is patent with at least mild stenosis. Right external iliac artery is heavily calcified and small. There is evidence for high-grade stenosis or short segment occlusion near the junction of the right external iliac artery and proximal common femoral artery. Outflow: Limited evaluation of the right common femoral artery due to a large amount of calcified plaque. Flow in the right profunda femoral arteries. No significant flow in the right SFA. Above the knee amputation. LEFT Lower Extremity Inflow: Left common iliac artery has  been stented at the origin. The left common iliac artery stent and artery are occluded. There is flow in the left internal and external iliac arteries. Diffuse atherosclerotic disease in the left external iliac artery. Outflow: Left common femoral artery is heavily calcified but patent. Flow in the left profunda femoral arteries. Left SFA is occluded at the origin. Reconstitution of flow in the popliteal artery above the knee. Runoff: Scattered calcifications involving the runoff vessels. No significant flow in the posterior tibial artery. Peroneal artery is patent down to the ankle. Areas of occlusion involving the anterior tibial artery. No significant flow in the dorsalis pedis artery. Veins: No obvious venous abnormality within the limitations of this arterial phase study. Review of the MIP images confirms the above findings. NON-VASCULAR Lower chest: Right heart is enlarged. Contrast refluxes into the hepatic veins and compatible with increased right heart pressures. Lung bases are clear. Hepatobiliary: Normal appearance of the gallbladder. Normal appearance of the liver. Pancreas: Unremarkable. No pancreatic ductal dilatation or surrounding inflammatory changes. Spleen: Normal in size without focal abnormality. Adrenals/Urinary Tract: No gross abnormality to the adrenal glands. 2.2 cm low-density structure in left kidney lower pole is suggestive for a cyst. Small amount  of fluid in the urinary bladder. Stomach/Bowel: Stomach is within normal limits. Appendix appears normal. No evidence of bowel wall thickening, distention, or inflammatory changes. Lymphatic: No lymph node enlargement in the abdomen or pelvis. Reproductive: There may be a small uterus but difficult to evaluate on this examination. No evidence for an adnexal mass. Other: Negative for ascites. Musculoskeletal: Multilevel degenerative disc disease in the lumbar spine. IMPRESSION: VASCULAR 1. Left inflow disease demonstrated by occlusion of the left common iliac artery and left common iliac artery stent. 2. Left outflow disease demonstrated by occlusion of the left SFA. There is reconstituted flow in the left popliteal artery. 3. Left runoff disease. Primary runoff in the left lower extremity is the left peroneal artery. 4. Right amputation above the knee. Occlusive disease involving the right common femoral artery but this area is difficult to evaluate due to heavily calcified plaque. 5. Diffuse atherosclerotic disease in the abdominal aorta. Aortic Atherosclerosis (ICD10-I70.0). 6. 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 7. Enlargement of the right heart with contrast refluxing into the hepatic veins. Findings are suggestive for increased right heart pressures. NON-VASCULAR 1. No acute abnormality in the abdomen or pelvis. Electronically Signed   By: Markus Daft M.D.   On: 11/10/2020 13:04   DG Chest Port 1 View  Result Date: 11/10/2020 CLINICAL DATA:  Fever, weakness EXAM: PORTABLE CHEST 1 VIEW COMPARISON:  10/09/2020 FINDINGS: Heart and mediastinal contours are within normal limits. No focal opacities or effusions. No acute bony abnormality. Aortic atherosclerosis. IMPRESSION: No active disease. Electronically Signed   By: Rolm Baptise M.D.   On: 11/10/2020 10:52    EKG: Independently reviewed. Sinus, no st elevations  Assessment/Plan Left leg  pain PVD Peripheral arterial occlusive disease     - admit to obs, tele @ [facility]     - CTA left leg shows occlusion of left common iliac artery and left SFA     - Vasc surg consulted (Dr. Carlis Abbott); rec'd admission to [facility] for eval and starting heparin gtt     - pain control     - pharm to dose heparin  HTN     - continue home regimen  HLD     - continue home regimen  Hx of non-small  cell lung CA     - continue outpt follow up     - continue home inhalers  Elevated trp     - trp flat; 38-36     - EKG shows some flipped T's     - she denies CP, dyspnea     - currently on heparin gtt; follow  DVT prophylaxis: heparin gtt  Code Status: FULL  Family Communication: w/ granddtr at bedside  Consults called: EDP spoke with Vasc surge (Dr. Carlis Abbott)   Status is: Observation  The patient remains OBS appropriate and will d/c before 2 midnights.  Dispo: The patient is from: Home              Anticipated d/c is to: Home              Patient currently is not medically stable to d/c.   Difficult to place patient No  Jonnie Finner DO [facility]  If 7PM-7AM, please contact night-coverage [email or web address]  11/10/2020, 3:34 PM

## 2020-11-10 NOTE — Progress Notes (Signed)
Pt arrived via [facility] and transferred to bed.  CHG completed and noted of right AKA and L foot cool to touch with notable discoloration on toes.  Tele initiated and VSS.  Dr. Carlis Abbott notified of pt arrival.

## 2020-11-10 NOTE — ED Notes (Signed)
Patient transported to CT 

## 2020-11-10 NOTE — Consult Note (Signed)
Hospital Consult    Reason for Consult:  Critical limb ischemia left leg, occluded left common iliac artery stent Referring Physician:  [name] ED MRN #:  [id number]  History of Present Illness: This is a 87 y.o. female with reported history of hypertension, hyperlipidemia, COPD, peripheral vascular disease status post right AKA that presents as a transfer from [facility] ED for vascular surgery evaluation of critical limb ischemia of the left lower extremity.  She was seen in the ED at [facility] and had a CT scan due to difficulty transferring on the toilet with some pain in the leg.  CTA showed left common iliac stent was occluded with infrainguinal disease.  On my evaluation here patient has a difficult time hearing and also has dementia and I cannot get any history from her.  She is able to wiggle her toes.    Patient previously had a left common iliac stent in 2009 by Dr. Scot Dock.  She was then taken back in 2010 for a more proximal left common iliac artery stent due to in-stent stenosis again on the left by Dr. Scot Dock.  She has had a previous right above-knee amputation.  She was last seen in our practice 02/18/2020 by our PA.  She had pain in the foot at that time and was recommended to follow-up in 3 months with Dr. Scot Dock with ABIs and left leg arterial duplex for stent stenosis.  It appears she never followed up.  The granddaughter provides a lot of the history here and states she went to the emergency room at [facility] because she was having trouble standing.  Patient has been complaining of some intermittent leg pain over the last 3 weeks.  In addition patient has been in significant decline refusing to eat and take her medications over the last 1 to 2 weeks.  Apparently she does live independently but has an Engineer, production.   Past Medical History:  Diagnosis Date  . Arthritis   . Cancer ([place])    rt lung  . COPD GOLD I 04/18/2013  . Glaucoma   . Hyperlipidemia   . Hypertension   .  Lung cancer ([place]) 06/07/2013  . Peripheral vascular disease, unspecified ([place])    with Claudication  . S/P radiation therapy 12/12, 12/15, and 07/19/15   The LLL nodule was treated to 54 Gy in 3 fractions of 18 Gy  . Shortness of breath   . Stroke [facility])     Past Surgical History:  Procedure Laterality Date  . ABOVE KNEE LEG AMPUTATION     Right  . ANGIOPLASTY / STENTING ILIAC  03/22/09   Aortogram-  left common iliac artery  . COLONOSCOPY  Feb. 2013  . HEMORRHOID SURGERY    . TONSILLECTOMY    . VIDEO ASSISTED THORACOSCOPY (VATS)/WEDGE RESECTION Right 06/16/2013   Procedure: VIDEO ASSISTED THORACOSCOPY (VATS)/WEDGE RESECTION;  Surgeon: [name], MD;  Location: [facility];  Service: Thoracic;  Laterality: Right;  Marland Kitchen VIDEO BRONCHOSCOPY Bilateral 04/10/2013   Procedure: VIDEO BRONCHOSCOPY WITH FLUORO;  Surgeon: Tanda Rockers, MD;  Location: [facility];  Service: Cardiopulmonary;  Laterality: Bilateral;  . VIDEO BRONCHOSCOPY N/A 06/16/2013   Procedure: VIDEO BRONCHOSCOPY;  Surgeon: [name], MD;  Location: [facility] OR;  Service: Thoracic;  Laterality: N/A;    Allergies  Allergen Reactions  . Aspirin Anaphylaxis  . Morphine And Related Other (See Comments)    confusion    Prior to Admission medications   Medication Sig Start Date End  Date Taking? Authorizing Provider  albuterol (PROVENTIL) (2.5 MG/3ML) 0.083% nebulizer solution Take 2.5 mg by nebulization every 6 (six) hours as needed for wheezing or shortness of breath. 10/15/19  Yes [provider]  amLODipine (NORVASC) 5 MG tablet Take 5 mg by mouth daily. 07/26/20  Yes [provider]  budesonide-formoterol (SYMBICORT) 160-4.5 MCG/ACT inhaler Inhale 2 puffs into the lungs 2 (two) times daily. 11/03/17  Yes Quintella Reichert, MD  Cholecalciferol (VITAMIN D3) 1000 units CAPS Take 1,000 capsules by mouth daily.   Yes [provider]  clopidogrel (PLAVIX) 75 MG tablet Take 1 tablet (75 mg total) by mouth  every Monday, Wednesday, and Friday. 10/01/17  Yes Clent Demark, PA-C  diclofenac sodium (VOLTAREN) 1 % GEL Apply 2 g topically 4 (four) times daily. 08/15/18  Yes [provider]  gabapentin (NEURONTIN) 100 MG capsule Take 1 capsule (100 mg total) by mouth 3 (three) times daily. 09/19/17  Yes Clent Demark, PA-C  losartan (COZAAR) 50 MG tablet Take 50 mg by mouth daily. 05/15/17  Yes [provider]  LUMIGAN 0.01 % SOLN Place 1 drop into both eyes at bedtime. 02/03/20  Yes [provider]  meclizine (ANTIVERT) 25 MG tablet Take 25 mg by mouth every 8 (eight) hours as needed for dizziness (dizziness).    Yes [provider]  omeprazole (PRILOSEC) 40 MG capsule Take 40 mg by mouth daily.   Yes [provider]  oxyCODONE (ROXICODONE) 5 MG immediate release tablet Take 0.5-1 tablets (2.5-5 mg total) by mouth every 6 (six) hours as needed for severe pain. 10/18/20  Yes Harris, Abigail, PA-C  pravastatin (PRAVACHOL) 20 MG tablet Take 20 mg by mouth at bedtime. 05/04/17  Yes [provider]  PROAIR HFA 108 (90 Base) MCG/ACT inhaler Inhale 2 puffs into the lungs every 6 (six) hours as needed. 02/12/20  Yes [provider]  RESTASIS 0.05 % ophthalmic emulsion Place 2 drops into both eyes 2 (two) times daily.  03/30/16  Yes [provider]  tiZANidine (ZANAFLEX) 2 MG tablet Take 2 mg by mouth 2 (two) times daily as needed for muscle spasms. 11/12/17  Yes [provider]  TRAVATAN Z 0.004 % SOLN ophthalmic solution Place 1 drop into both eyes at bedtime.  03/29/16  Yes [provider]    Social History   Socioeconomic History  . Marital status: Widowed    Spouse name: Not on file  . Number of children: 1  . Years of education: Not on file  . Highest education level: Not on file  Occupational History  . Occupation: Retired  Tobacco Use  . Smoking status: Former Smoker    Packs/day: 0.00    Years: 60.00    Pack  years: 0.00    Types: Cigarettes    Quit date: 07/31/2009    Years since quitting: 11.2  . Smokeless tobacco: Never Used  Vaping Use  . Vaping Use: Never used  Substance and Sexual Activity  . Alcohol use: No  . Drug use: No  . Sexual activity: Never  Other Topics Concern  . Not on file  Social History Narrative   Pt lives alone. She is HOH, especially left ear.   Social Determinants of Health   Financial Resource Strain: Not on file  Food Insecurity: Not on file  Transportation Needs: Not on file  Physical Activity: Not on file  Stress: Not on file  Social Connections: Not on file  Intimate Partner Violence: Not on  file    Family History  Problem Relation Age of Onset  . Stomach cancer Mother   . Stroke Brother   . Asthma Sister   . Colon cancer Brother     ROS: [x]  Positive   [ ]  Negative   [ ]  All sytems reviewed and are negative  Cardiovascular: []  chest pain/pressure []  palpitations []  SOB lying flat []  DOE []  pain in legs while walking []  pain in legs at rest []  pain in legs at night []  non-healing ulcers []  hx of DVT []  swelling in legs  Pulmonary: []  productive cough []  asthma/wheezing []  home O2  Neurologic: []  weakness in []  arms []  legs []  numbness in []  arms []  legs []  hx of CVA []  mini stroke [] difficulty speaking or slurred speech []  temporary loss of vision in one eye []  dizziness  Hematologic: []  hx of cancer []  bleeding problems []  problems with blood clotting easily  Endocrine:   []  diabetes []  thyroid disease  GI []  vomiting blood []  blood in stool  GU: []  CKD/renal failure []  HD--[]  M/W/F or []  T/T/S []  burning with urination []  blood in urine  Psychiatric: []  anxiety []  depression  Musculoskeletal: []  arthritis []  joint pain  Integumentary: []  rashes []  ulcers  Constitutional: []  fever []  chills   Physical Examination  Vitals:   11/10/20 1600 11/10/20 1750  BP: (!) 145/71 136/70  Pulse: 79 97  Resp:  20 20  Temp:  (!) 97.4 F (36.3 C)  SpO2: 99% 100%   Body mass index is 28.85 kg/m.  General:  NAD Gait: Not observed HENT: WNL, normocephalic Pulmonary: normal non-labored breathing Cardiac: regular, without  Murmurs, rubs or gallops Abdomen: soft, NT/ND Vascular Exam/Pulses: No palpable femoral pulses bilaterally Right AKA Left peroneal signal monophasic at the ankle Left 2nd/3rd toes discolored Extremities: Discolored left second and third toes but no open ulcerations at this time Musculoskeletal: no muscle wasting or atrophy  Neurologic: A&O X 3; Appropriate Affect ; SENSATION: normal; MOTOR FUNCTION:  moving all extremities equally. Speech is fluent/normal   CBC    Component Value Date/Time   WBC 7.2 11/10/2020 1027   RBC 4.89 11/10/2020 1027   HGB 15.2 (H) 11/10/2020 1027   HGB 13.7 07/04/2019 0941   HGB 13.5 09/19/2017 1124   HGB 12.6 05/18/2017 0853   HCT 45.5 11/10/2020 1027   HCT 42.3 09/19/2017 1124   HCT 38.8 05/18/2017 0853   PLT 337 11/10/2020 1027   PLT 272 07/04/2019 0941   PLT 297 09/19/2017 1124   MCV 93.0 11/10/2020 1027   MCV 93 09/19/2017 1124   MCV 92.2 05/18/2017 0853   MCH 31.1 11/10/2020 1027   MCHC 33.4 11/10/2020 1027   RDW 14.3 11/10/2020 1027   RDW 14.6 09/19/2017 1124   RDW 14.5 05/18/2017 0853   LYMPHSABS 1.4 11/10/2020 1027   LYMPHSABS 2.0 09/19/2017 1124   LYMPHSABS 1.7 05/18/2017 0853   MONOABS 0.6 11/10/2020 1027   MONOABS 0.3 05/18/2017 0853   EOSABS 0.0 11/10/2020 1027   EOSABS 0.0 09/19/2017 1124   BASOSABS 0.0 11/10/2020 1027   BASOSABS 0.0 09/19/2017 1124   BASOSABS 0.0 05/18/2017 0853    BMET    Component Value Date/Time   NA 134 (L) 11/10/2020 1027   NA 141 09/19/2017 1124   NA 143 05/18/2017 0853   K 4.7 11/10/2020 1027   K 4.1 05/18/2017 0853   CL 98 11/10/2020 1027   CO2 23 11/10/2020 1027   CO2 23  05/18/2017 0853   GLUCOSE 80 11/10/2020 1027   GLUCOSE 95 05/18/2017 0853   BUN 19 11/10/2020 1027    BUN 15 09/19/2017 1124   BUN 13.7 05/18/2017 0853   CREATININE 0.97 11/10/2020 1027   CREATININE 1.27 (H) 07/04/2019 0941   CREATININE 0.8 05/18/2017 0853   CALCIUM 9.4 11/10/2020 1027   CALCIUM 9.3 05/18/2017 0853   GFRNONAA 56 (L) 11/10/2020 1027   GFRNONAA 37 (L) 07/04/2019 0941   GFRAA 43 (L) 07/04/2019 0941    COAGS: Lab Results  Component Value Date   INR 1.0 11/10/2020   INR 1.40 10/26/2015   INR 1.02 06/12/2013     Non-Invasive Vascular Imaging:    I independently reviewed CTA and she has occluded left common iliac artery stent, left SFA is occluded, she does reconstitute popliteal artery with peroneal runoff in the left leg.  Right common femoral artery appears occluded.  Aortoiliac segment diffusely diseased.   ASSESSMENT/PLAN: This is a 87 y.o. female with multiple medical comorbidities including underlying dementia that vascular surgery was consulted for evidence of occluded left common iliac stent with left leg pain.  This is a difficult management situation given the patient really cannot provide any history that is reliable.  It is unclear to me about the duration of her iliac stent occlusion given she has not been seen in 10 months.  In talking with her granddaughter at bedside sounds like she has been in significant decline over the last couple of week and has been refusing to eat and take her medications.  I do not think there is a simple endovascular option here given that we could certainly attempt retrograde access of the left common femoral to realign her left iliac stent stent but I think this would likely require stenting the right iliac as well given she does have thrombus that extends above the occluded stent stent.  Unfortunately her right common femoral is occluded and we would likely have to come from the arm with brachial access as well.  In addition if this is unsuccessful then would be talking about some kind of surgical bypass. In talking with her  granddaughter we agreed the best option would be to get palliative care consult tomorrow to establish goals of care.  It may be that if she can tolerate her level of ischemia she can be discharged but I will allow the patient and her family to make that decision.  I will also notify Dr. Scot Dock of the patient's admission tomorrow.  She does have a monophasic peroneal signal at the ankle and is motor intact.  Her second and third toes are discolored but no active tissue loss at this time.  She has significant infra-inguinal disease that is chronic.  We will see how much she improves on heparin.  Marty Heck, MD [facility] Office: [place]

## 2020-11-10 NOTE — ED Notes (Signed)
[facility] called for transport. 

## 2020-11-10 NOTE — ED Notes (Signed)
Per patient's granddaughter at bedside, patient takes oxycodone at home for chronic pain.  Verbal order from [place], EDP for 5 mg oxycodone PO once.

## 2020-11-10 NOTE — Progress Notes (Signed)
[facility] for Heparin Indication: PVD, possible ischemic foot  Allergies  Allergen Reactions  . Aspirin Anaphylaxis  . Morphine And Related Other (See Comments)    confusion    Patient Measurements: Height: 5' (152.4 cm) Weight: 67 kg (147 lb 11.3 oz) IBW/kg (Calculated) : 45.5 Heparin Dosing Weight: 59.9 kg  Vital Signs: Temp: 97.4 F (36.3 C) (04/13 1750) Temp Source: Oral (04/13 1750) BP: 136/70 (04/13 1750) Pulse Rate: 97 (04/13 1750)  Labs: Recent Labs    11/10/20 1027 11/10/20 1241 11/10/20 2314  HGB 15.2*  --   --   HCT 45.5  --   --   PLT 337  --   --   APTT 34  --   --   LABPROT 13.6  --   --   INR 1.0  --   --   HEPARINUNFRC  --   --  0.58  CREATININE 0.97  --   --   TROPONINIHS 38* 36*  --     Estimated Creatinine Clearance: 32.9 mL/min (by C-G formula based on SCr of 0.97 mg/dL).   Assessment: 87 yo female with hx of PVD s/p amputation of RLE and L common iliac stenting admitted with L leg pain.  CT shows occlusions of the left common iliac and SFA.  Pharmacy is consulted to dose IV heparin.  Baseline coags and CBC wnl, takes plavix.  Heparin level therapeutic (0.58) on gtt at 800 units/hr. No bleeding noted.    Goal of Therapy:  Heparin level 0.3-0.7 units/ml Monitor platelets by anticoagulation protocol: Yes   Plan:  Continue heparin 800 units/hr IV  F/u daily heparin level to confirm therapeutic  Sherlon Handing, PharmD, BCPS Please see amion for complete clinical pharmacist phone list 11/10/2020,11:56 PM

## 2020-11-10 NOTE — ED Triage Notes (Signed)
Patient BIB [facility] from home because home health CNA says that patient has not moved from her couch in over 24 hours, c/o weakness and left foot pain with redness and swelling.  Hx of dementia, was seen here on 3/21 for left hip pain after a fall. Patient very [place].  At baseline patient can walker with walker with her right prosthetic leg on.  Vitals were 146/98 90-HR 18-RR 99% room air 94-CBG

## 2020-11-10 NOTE — ED Notes (Signed)
[facility] arrival to transport patient to [facility].  Granddaughter at bedside and took belongings.

## 2020-11-11 ENCOUNTER — Other Ambulatory Visit: Payer: Self-pay

## 2020-11-11 ENCOUNTER — Ambulatory Visit: Payer: Medicare Other | Admitting: Podiatry

## 2020-11-11 DIAGNOSIS — I739 Peripheral vascular disease, unspecified: Secondary | ICD-10-CM | POA: Diagnosis not present

## 2020-11-11 LAB — COMPREHENSIVE METABOLIC PANEL
ALT: 15 U/L (ref 0–44)
AST: 20 U/L (ref 15–41)
Albumin: 2.4 g/dL — ABNORMAL LOW (ref 3.5–5.0)
Alkaline Phosphatase: 34 U/L — ABNORMAL LOW (ref 38–126)
Anion gap: 8 (ref 5–15)
BUN: 16 mg/dL (ref 8–23)
CO2: 23 mmol/L (ref 22–32)
Calcium: 8.3 mg/dL — ABNORMAL LOW (ref 8.9–10.3)
Chloride: 101 mmol/L (ref 98–111)
Creatinine, Ser: 0.94 mg/dL (ref 0.44–1.00)
GFR, Estimated: 58 mL/min — ABNORMAL LOW (ref >60)
Glucose, Bld: 84 mg/dL (ref 70–99)
Potassium: 3.9 mmol/L (ref 3.5–5.1)
Sodium: 132 mmol/L — ABNORMAL LOW (ref 135–145)
Total Bilirubin: 1 mg/dL (ref 0.3–1.2)
Total Protein: 5 g/dL — ABNORMAL LOW (ref 6.5–8.1)

## 2020-11-11 LAB — CBC
HCT: 35.4 % — ABNORMAL LOW (ref 36.0–46.0)
Hemoglobin: 11.9 g/dL — ABNORMAL LOW (ref 12.0–15.0)
MCH: 31.7 pg (ref 26.0–34.0)
MCHC: 33.6 g/dL (ref 30.0–36.0)
MCV: 94.4 fL (ref 80.0–100.0)
Platelets: 268 10*3/uL (ref 150–400)
RBC: 3.75 MIL/uL — ABNORMAL LOW (ref 3.87–5.11)
RDW: 14.1 % (ref 11.5–15.5)
WBC: 5.2 10*3/uL (ref 4.0–10.5)
nRBC: 0 % (ref 0.0–0.2)

## 2020-11-11 LAB — HEPARIN LEVEL (UNFRACTIONATED): Heparin Unfractionated: 0.54 IU/mL (ref 0.30–0.70)

## 2020-11-11 MED ORDER — HALOPERIDOL LACTATE 5 MG/ML IJ SOLN
2.0000 mg | Freq: Four times a day (QID) | INTRAMUSCULAR | Status: DC | PRN
  Administered 2020-11-15 – 2020-11-16 (×2): 2 mg via INTRAVENOUS
  Filled 2020-11-11 (×2): qty 1

## 2020-11-11 NOTE — Plan of Care (Signed)
  Problem: Clinical Measurements: Goal: Respiratory complications will improve Outcome: Progressing Goal: Cardiovascular complication will be avoided Outcome: Progressing   Problem: Health Behavior/Discharge Planning: Goal: Ability to manage health-related needs will improve Outcome: Not Progressing   

## 2020-11-11 NOTE — TOC Initial Note (Signed)
Transition of Care (TOC) - Initial/Assessment Note  Marvetta Gibbons RN, BSN Transitions of Care Unit 4E- RN Case Manager See Treatment Team for direct phone #    Patient Details  Name: [name] MRN: [id number] Date of Birth: Sep 08, 1927  Transition of Care [facility]) CM/SW Contact:    Dawayne Patricia, RN Phone Number: 11/11/2020, 5:08 PM  Clinical Narrative:                 Pt admitted from home, spoke with granddaughter [name] at the bedside. Per conversation she is interested in taking pt home however states she would need more assistance in the home. Pt has a PCS aide now with Medicaid- comes 2.5 hrs/day- granddaughter is working to get these hours increased however paperwork was wrong when submitted and now is under appeal and [facility] needs to come out and do new assessment for increased hours. PT eval still pending, noted OT felt pt to be close to baseline, also waiting on [facility] consult. Granddaughter is open to facility placement if felt pt would benefit from it, however would also need insurance approval. Explained TOC would be back to further discuss after consults and evals have been completed, explained and answered questions on PC vs Hospice in the home and that these services do not provide extra caregiver hours in the home, family is still primary caregiver.   Expected Discharge Plan: [place] Barriers to Discharge: Continued Medical Work up   Patient Goals and CMS Choice     Choice offered to / list presented to :  (Granddaughter)  Expected Discharge Plan and Services Expected Discharge Plan: [place]   Discharge Planning Services: CM Consult   Living arrangements for the past 2 months: Apartment                                      Prior Living Arrangements/Services Living arrangements for the past 2 months: Apartment Lives with:: Self Patient language and need for interpreter reviewed:: Yes Do you feel safe going  back to the place where you live?: Yes      Need for Family Participation in Patient Care: Yes (Comment) Care giver support system in place?: Yes (comment) Current home services: DME,Homehealth aide Criminal Activity/Legal Involvement Pertinent to Current Situation/Hospitalization: No - Comment as needed  Activities of Daily Living Home Assistive Devices/Equipment: Walker (specify type),Nebulizer,Other (Comment),Wheelchair,Bedside commode/3-in-1,Shower chair with back ADL Screening (condition at time of admission) Patient's cognitive ability adequate to safely complete daily activities?: No Is the patient deaf or have difficulty hearing?: Yes Does the patient have difficulty seeing, even when wearing glasses/contacts?: Yes Does the patient have difficulty concentrating, remembering, or making decisions?: Yes Patient able to express need for assistance with ADLs?: Yes Does the patient have difficulty dressing or bathing?: Yes Independently performs ADLs?: No Communication: Independent Dressing (OT): Needs assistance Is this a change from baseline?: Change from baseline, expected to last >3 days Grooming: Needs assistance Is this a change from baseline?: Change from baseline, expected to last >3 days Feeding: Independent Bathing: Needs assistance Is this a change from baseline?: Change from baseline, expected to last >3 days Toileting: Needs assistance Is this a change from baseline?: Change from baseline, expected to last >3days In/Out Bed: Needs assistance Is this a change from baseline?: Change from baseline, expected to last >3 days Walks in Home: Dependent Is this a  change from baseline?: Change from baseline, expected to last >3 days Does the patient have difficulty walking or climbing stairs?: Yes Weakness of Legs: Both Weakness of Arms/Hands: Both  Permission Sought/Granted                  Emotional Assessment Appearance:: Appears stated  age Attitude/Demeanor/Rapport: Unable to Assess Affect (typically observed): Unable to Assess Orientation: : Oriented to Self Alcohol / Substance Use: Not Applicable Psych Involvement: No (comment)  Admission diagnosis:  Arterial occlusion [I70.90] Ischemic foot [I99.8] Patient Active Problem List   Diagnosis Date Noted  . Ischemic foot 11/10/2020  . Cough 02/15/2016  . Bilateral sensorineural hearing loss 12/22/2015  . Sepsis ([place]) 10/26/2015  . Vertigo 08/03/2015  . Full code status 05/10/2015  . Numbness-Left Heel and Toes  10/28/2014  . Sharp pain- Left Lateral Thigh and Right Stump 10/28/2014  . Cancer of lower lobe of left lung ([place]) 06/07/2013  . COPD GOLD I 04/18/2013  . Hemoptysis 04/10/2013  . Pain in joint, lower leg 03/19/2013  . Iliac artery stenosis, left ([place]) 03/13/2012  . Peripheral vascular disease, unspecified ([place]) 02/28/2012  . Chest pain 10/04/2011  . Hypertension 10/04/2011  . Hyperlipidemia 10/04/2011   PCP:  [name], MD Pharmacy:   [facility] [place] [address] Phone: [phone] Fax: [phone]     Social Determinants of Health (SDOH) Interventions    Readmission Risk Interventions No flowsheet data found.

## 2020-11-11 NOTE — Progress Notes (Signed)
VASCULAR SURGERY:  This patient is well-known to me.  She had a left common iliac artery stent in 2009.  She presents with hip pain and the stent is occluded although it is difficult to determine the acuity of this.  Regardless, I totally agree with Dr. Ainsley Spinner assessment that intervention would be associated with significantly increased risk given her age and debilitated state.  I think some of her hip pain could certainly simply be related to arthritis.  Regardless the granddaughter is considering palliative care which I think is perfectly appropriate given her dementia and her  Age.  Deitra Mayo, MD Office: [phone]

## 2020-11-11 NOTE — Evaluation (Signed)
Occupational Therapy Evaluation Patient Details Name: [name] MRN: [id number] DOB: April 01, 1928 Today's Date: 11/11/2020    History of Present Illness 87 y.o. female with medical history significant of HTN, PVD, PAOD, HLD. 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 The family is considering palliative care at home.   Clinical Impression   Patient admited for the above diagnosis.  Per the granddaughter, she is needing up to max A for ADL and transfers related to toileting.  Family is very supportive, but they are considering palliative to help provide more services to help them with care and meds during the daytime hours.  She is most likely at or near her baseline function for ADL and toileting independence.  No real acute OT needs identified given level of assist needed at home and cognitive status.  A hospital bed, and daytime aide assist is recommended.         Follow Up Recommendations  Home health OT    Equipment Recommendations  Hospital bed    Recommendations for Other Services       Precautions / Restrictions Precautions Precautions: Fall Restrictions Weight Bearing Restrictions: No      Mobility Bed Mobility Overal bed mobility: Needs Assistance Bed Mobility: Supine to Sit     Supine to sit: Mod assist     General bed mobility comments: assist to bring trunk up and scoot hips to the edge of the bed. Patient Response: Cooperative  Transfers Overall transfer level: Needs assistance   Transfers: Sit to/from [employer] Transfers Sit to Stand: Max assist   Squat pivot transfers: Max assist          Balance Overall balance assessment: Needs assistance Sitting-balance support: Single extremity supported Sitting balance-Leahy Scale: Fair     Standing balance support: Bilateral upper extremity supported Standing balance-Leahy Scale: Poor                             ADL either performed or  assessed with clinical judgement   ADL Overall ADL's : At baseline                                       General ADL Comments: Patient requires Max A at home PLOF for ADL and toileting.  Family assists with meds, meals, home management and community mobility.  Granddaughter states she stays in the home.     Vision Patient Visual Report: No change from baseline       Perception     Praxis      Pertinent Vitals/Pain Pain Assessment: Faces Faces Pain Scale: Hurts little more Pain Location: R knee and hip Pain Descriptors / Indicators: Tender;Grimacing;Guarding Pain Intervention(s): Monitored during session     Hand Dominance Right   Extremity/Trunk Assessment Upper Extremity Assessment Upper Extremity Assessment: Overall WFL for tasks assessed   Lower Extremity Assessment Lower Extremity Assessment: Defer to PT evaluation   Cervical / Trunk Assessment Cervical / Trunk Assessment: Kyphotic   Communication Communication Communication: No difficulties;HOH   Cognition Arousal/Alertness: Awake/alert Behavior During Therapy: WFL for tasks assessed/performed Overall Cognitive Status: History of cognitive impairments - at baseline  Home Living Family/patient expects to be discharged to:: Private residence Living Arrangements: Children;Other relatives Available Help at Discharge: Family;Available 24 hours/day Type of Home: House Home Access: Level entry     Home Layout: One level     Bathroom Shower/Tub: Tub/shower unit;Walk-in shower   Bathroom Toilet: Standard     Home Equipment: Environmental consultant - 2 wheels;Wheelchair - manual;Hospital bed;Bedside commode   Additional Comments: Granddaughter states the patient does not access the shower or bathroom.  She generally stays in the bed flat or sitting EOB, or transfers to the w/c.  Patient is not really wearing her prosthesis anymore.       Prior Functioning/Environment Level of Independence: Needs assistance  Gait / Transfers Assistance Needed: Increasing assist needed.  Mod to Max A for basic transfers. ADL's / Homemaking Assistance Needed: Increasing assist.  Family has paid caregivers for a few hours a week to assist with bed level ADL.            OT Problem List: Impaired balance (sitting and/or standing);Pain      OT Treatment/Interventions:      OT Goals(Current goals can be found in the care plan section) Acute Rehab OT Goals Patient Stated Goal: Family wants pallaitive services in the home to increase aide assist for self care, hospital bed and continued HH PT,OT,ST if possible. OT Goal Formulation: With patient/family Time For Goal Achievement: 11/11/20 Potential to Achieve Goals: Good  OT Frequency:     Barriers to D/C:  none noted          Co-evaluation              AM-PAC OT "6 Clicks" Daily Activity     Outcome Measure Help from another person eating meals?: None Help from another person taking care of personal grooming?: A Little Help from another person toileting, which includes using toliet, bedpan, or urinal?: A Lot Help from another person bathing (including washing, rinsing, drying)?: A Lot Help from another person to put on and taking off regular upper body clothing?: A Lot Help from another person to put on and taking off regular lower body clothing?: A Lot 6 Click Score: 15   End of Session Equipment Utilized During Treatment: Gait belt Nurse Communication: Need for lift equipment  Activity Tolerance: Patient tolerated treatment well Patient left: in chair;with call bell/phone within reach;with chair alarm set;with family/visitor present  OT Visit Diagnosis: Pain Pain - Right/Left: Right Pain - part of body: Leg                Time: 1102-1117 OT Time Calculation (min): 19 min Charges:  OT General Charges $OT Visit: 1 Visit OT Evaluation $OT Eval Moderate Complexity:  1 Mod  11/11/2020  Rich, OTR/L  Acute Rehabilitation Services  Office:  [phone]   Metta Clines 11/11/2020, 4:08 PM

## 2020-11-11 NOTE — Progress Notes (Signed)
Progress Note    [name]   KWI:[id number]  DOB: Mar 10, 1928  DOA: 11/10/2020     0  PCP: [name], MD  CC: left leg pain   Hospital Course: [name] is a 87 y.o. female with medical history significant of HTN, PVD, PAOD, HLD. Presenting with left leg pain. Hx from granddtr at bedside. Her granddtr reports that the patient has been complaining of pain more frequently at home and having more difficulty with ambulation.  Over the past several weeks to months, her granddaughter has noticed an overall gradual decline in the patient's ability to thrive in general.  She also notes that the patient has been becoming more confused/forgetful at times (example provided was she is now forgetting to pay bills which was never an issue in the past).  She also has been more dependent recently on pain medications due to the ongoing pain, spasms, cramping in her left leg.  She is also not eating as much as previously due to pain associated with eating as well at times.  She was admitted for vascular surgery evaluation. After evaluation, her occlusive disease was considered rather remarkable and intervention would be high risk with the patient's underlying comorbidities, frailty, and overall debility.  Palliative care was recommended.   Interval History:  Seen this morning, her granddaughter is bedside.  We had a very long discussion regarding CT findings as well as discussions with vascular surgery.  Granddaughter does endorse that she has noticed a change in the patient over these past several weeks to months and does recognize the decline however is tearful and sad over the changes seen. She is anxious for talking with palliative care to help figure out next steps.  She is very "undecided" on what to do but does state that she does not want her grandmother to suffer or be in pain. We talked about palliative care being consulted and she understands might not be seen until tomorrow. When talking  with the patient, she was only oriented to her name.  She endorsed ongoing pain in her left leg and abdomen.  ROS: Constitutional: negative for chills and fevers, Respiratory: negative for cough, Cardiovascular: negative for chest pain and Gastrointestinal: positive for abdominal pain  Assessment & Plan:  Severe PVD - patient has occluded celiac trunk with retrograde flow from SMA and PDA branches; calcified plaque at aortic bifurcation and origin of right CIA; right EIA heavily calcified and small; junction of right EIA and proximal CFA has high-grade stenosis w/ short segment occlusion; calcified plaque in right CFA; no significant flow in right SFA (and R AKA noted); Left CIA is stented and occluded; left EIA has diffuse atherosclerotic disease; left CFA is heavily calcified but patent; left SFA occluded at origin (reconstitution of flow in popliteal artery above-knee); no significant flow in PTA; peroneal artery patent to ankle; areas of occlusion involving anterior tibial artery noted; no significant flow in dorsalis pedis artery -Has been evaluated by vascular surgery.  Intervention would require overly aggressive measures (starting in right brachial artery possibly) - vascular surgery and myself have dicussed that palliative care is more appropriate at this time and intervention may be more harm than good at this point; patient is also starting to exhibit some signs of dementia per her granddaughter and she has been also not eating as much probably in part due to her mesenteric ischemia as well -Focus at this time should be on pain relief as needed.  Granddaughter starting to process  the information given to her since yesterday.  Plan will be for palliative care evaluation probably tomorrow which granddaughter is expecting -Continue heparin drip for now and follow-up any further vascular surgery input -Continue oxycodone and Zanaflex  HTN -Continue amlodipine, losartan  HLD -On pravastatin  currently.  Does have severe PAD however mortality benefit from statin questionable at this point; if going to pursue less aggressive measures, will probably discontinue statin at that time  Hx NCSLC  - stable; continue inhalers   Elevated troponin  - flat trend; low suspicion for ACS at this nor would be a good candidate yet - continue heparin drip for now   Old records reviewed in assessment of this patient  Antimicrobials: n/a  DVT prophylaxis: Heparin drip    Code Status:   Code Status: Full Code Family Communication: granddaughter   Disposition Plan: Status is: Observation  The patient will require care spanning > 2 midnights and should be moved to inpatient because: Ongoing active pain requiring inpatient pain management, IV treatments appropriate due to intensity of illness or inability to take PO and Inpatient level of care appropriate due to severity of illness  Dispo: The patient is from: Home              Anticipated d/c is to: Home              Patient currently is not medically stable to d/c.   Difficult to place patient No  Risk of unplanned readmission score:     Objective: Blood pressure 124/63, pulse 70, temperature 98.1 F (36.7 C), temperature source Oral, resp. rate 20, height 5' (1.524 m), weight 67 kg, SpO2 96 %.  Examination: General appearance: Chronically ill-appearing elderly woman laying in bed appearing uncomfortable but no acute distress Head: Normocephalic, without obvious abnormality, atraumatic Eyes: EOMI Lungs: clear to auscultation bilaterally Heart: regular rate and rhythm and S1, S2 normal Abdomen: Thin, nonspecific tenderness to palpation, bowel sounds present Extremities: Cool left foot to the touch with darkening of skin on toes.  Unable to palpate DP/PT pulses.  No edema noted.  No ulcers noted.  Right AKA noted.  Left leg tender to palpation throughout Skin: mobility and turgor normal Neurologic: follows commands, moves all 4  extremities   Consultants:   Vascular surgery  Procedures:   n/a  Data Reviewed: I have personally reviewed following labs and imaging studies Results for orders placed or performed during the hospital encounter of 11/10/20 (from the past 24 hour(s))  Heparin level (unfractionated)     Status: None   Collection Time: 11/10/20 11:14 PM  Result Value Ref Range   Heparin Unfractionated 0.58 0.30 - 0.70 IU/mL  Comprehensive metabolic panel     Status: Abnormal   Collection Time: 11/11/20  5:38 AM  Result Value Ref Range   Sodium 132 (L) 135 - 145 mmol/L   Potassium 3.9 3.5 - 5.1 mmol/L   Chloride 101 98 - 111 mmol/L   CO2 23 22 - 32 mmol/L   Glucose, Bld 84 70 - 99 mg/dL   BUN 16 8 - 23 mg/dL   Creatinine, Ser 0.94 0.44 - 1.00 mg/dL   Calcium 8.3 (L) 8.9 - 10.3 mg/dL   Total Protein 5.0 (L) 6.5 - 8.1 g/dL   Albumin 2.4 (L) 3.5 - 5.0 g/dL   AST 20 15 - 41 U/L   ALT 15 0 - 44 U/L   Alkaline Phosphatase 34 (L) 38 - 126 U/L   Total Bilirubin 1.0  0.3 - 1.2 mg/dL   GFR, Estimated 58 (L) >60 mL/min   Anion gap 8 5 - 15  Heparin level (unfractionated)     Status: None   Collection Time: 11/11/20  5:38 AM  Result Value Ref Range   Heparin Unfractionated 0.54 0.30 - 0.70 IU/mL  CBC     Status: Abnormal   Collection Time: 11/11/20  5:38 AM  Result Value Ref Range   WBC 5.2 4.0 - 10.5 K/uL   RBC 3.75 (L) 3.87 - 5.11 MIL/uL   Hemoglobin 11.9 (L) 12.0 - 15.0 g/dL   HCT 35.4 (L) 36.0 - 46.0 %   MCV 94.4 80.0 - 100.0 fL   MCH 31.7 26.0 - 34.0 pg   MCHC 33.6 30.0 - 36.0 g/dL   RDW 14.1 11.5 - 15.5 %   Platelets 268 150 - 400 K/uL   nRBC 0.0 0.0 - 0.2 %    Recent Results (from the past 240 hour(s))  Culture, blood (routine x 2)     Status: None (Preliminary result)   Collection Time: 11/10/20  9:55 AM   Specimen: BLOOD  Result Value Ref Range Status   Specimen Description   Final    BLOOD LEFT ANTECUBITAL Performed at [facility], [place] [address],  [place], [place] [address]    Special Requests   Final    BOTTLES DRAWN AEROBIC AND ANAEROBIC Blood Culture results may not be optimal due to an inadequate volume of blood received in culture bottles Performed at [facility] [name], [place], [place] [address]    Culture   Final    NO GROWTH < 24 HOURS Performed at [facility] [address] [place], [place] [address]    Report Status PENDING  Incomplete  Culture, blood (routine x 2)     Status: None (Preliminary result)   Collection Time: 11/10/20 10:27 AM   Specimen: BLOOD  Result Value Ref Range Status   Specimen Description   Final    BLOOD BLOOD RIGHT FOREARM Performed at [facility] [address] [place], [place] [address]    Special Requests   Final    BOTTLES DRAWN AEROBIC AND ANAEROBIC Blood Culture adequate volume Performed at [facility] [address], [place], [place] [address]    Culture   Final    NO GROWTH < 24 HOURS Performed at [facility] [address] [place], [place] [address]    Report Status PENDING  Incomplete  Resp Panel by RT-PCR (Flu A&B, Covid) Nasopharyngeal Swab     Status: None   Collection Time: 11/10/20  1:33 PM   Specimen: Nasopharyngeal Swab; Nasopharyngeal(NP) swabs in vial transport medium  Result Value Ref Range Status   SARS Coronavirus 2 by RT PCR NEGATIVE NEGATIVE Final    Comment: (NOTE) SARS-CoV-2 target nucleic acids are NOT DETECTED.  The SARS-CoV-2 RNA is generally detectable in upper respiratory specimens during the acute phase of infection. The lowest concentration of SARS-CoV-2 viral copies this assay can detect is 138 copies/mL. A negative result does not preclude SARS-Cov-2 infection and should not be used as the sole basis for treatment or other patient management decisions. A negative result may occur with  improper specimen collection/handling, submission of specimen other than  nasopharyngeal swab, presence of viral mutation(s) within the areas targeted by this assay, and inadequate number of viral copies(<138 copies/mL). A negative result must be combined with clinical observations, patient  history, and epidemiological information. The expected result is Negative.  Fact Sheet for Patients:  EntrepreneurPulse.com.au  Fact Sheet for Healthcare Providers:  IncredibleEmployment.be  This test is no t yet approved or cleared by the [place] FDA and  has been authorized for detection and/or diagnosis of SARS-CoV-2 by FDA under an Emergency Use Authorization (EUA). This EUA will remain  in effect (meaning this test can be used) for the duration of the COVID-19 declaration under Section 564(b)(1) of the Act, 21 U.S.C.section 360bbb-3(b)(1), unless the authorization is terminated  or revoked sooner.       Influenza A by PCR NEGATIVE NEGATIVE Final   Influenza B by PCR NEGATIVE NEGATIVE Final    Comment: (NOTE) The Xpert Xpress SARS-CoV-2/FLU/RSV plus assay is intended as an aid in the diagnosis of influenza from Nasopharyngeal swab specimens and should not be used as a sole basis for treatment. Nasal washings and aspirates are unacceptable for Xpert Xpress SARS-CoV-2/FLU/RSV testing.  Fact Sheet for Patients: EntrepreneurPulse.com.au  Fact Sheet for Healthcare Providers: IncredibleEmployment.be  This test is not yet approved or cleared by the [place] FDA and has been authorized for detection and/or diagnosis of SARS-CoV-2 by FDA under an Emergency Use Authorization (EUA). This EUA will remain in effect (meaning this test can be used) for the duration of the COVID-19 declaration under Section 564(b)(1) of the Act, 21 U.S.C. section 360bbb-3(b)(1), unless the authorization is terminated or revoked.  Performed at [facility], [place] [address] [place], [place] [address]      Radiology Studies: CT Angio Aortobifemoral W and/or Wo Contrast  Result Date: 11/10/2020 CLINICAL DATA:  Left foot pain and redness. EXAM: CT ANGIOGRAPHY OF ABDOMINAL AORTA WITH ILIOFEMORAL RUNOFF TECHNIQUE: Multidetector CT imaging of the abdomen, pelvis and lower extremities was performed using the standard protocol during bolus administration of intravenous contrast. Multiplanar CT image reconstructions and MIPs were obtained to evaluate the vascular anatomy. CONTRAST:  156mL OMNIPAQUE IOHEXOL 350 MG/ML SOLN COMPARISON:  CT without contrast 11/07/2016 FINDINGS: VASCULAR Aorta: Diffuse atherosclerotic disease in the abdominal aorta without aneurysm or dissection. Mild narrowing in the distal abdominal aorta. Celiac: Origin of the celiac trunk is occluded. There is flow in the distal celiac trunk which is likely retrograde flow from the SMA and pancreatic-duodenal branches. GDA is prominent. SMA: SMA is patent. Prominent pancreatic-duodenal branches. Atherosclerotic disease in the SMA. Renals: Atherosclerotic disease involving the bilateral renal arteries and difficult to exclude renal artery stenosis particularly at the origin of the right renal artery. No evidence for renal artery aneurysm or dissection. IMA: Patent RIGHT Lower Extremity Inflow: Large amount of calcified plaque at the aortic bifurcation and origin of the right common iliac artery. Right common iliac artery is patent with at least mild stenosis. Right external iliac artery is heavily calcified and small. There is evidence for high-grade stenosis or short segment occlusion near the junction of the right external iliac artery and proximal common femoral artery. Outflow: Limited evaluation of the right common femoral artery due to a large amount of calcified plaque. Flow in the right profunda femoral arteries. No significant flow in the right SFA. Above the knee amputation. LEFT Lower Extremity Inflow: Left  common iliac artery has been stented at the origin. The left common iliac artery stent and artery are occluded. There is flow in the left internal and external iliac arteries. Diffuse atherosclerotic disease in the left external iliac artery. Outflow: Left common femoral artery is heavily calcified but patent. Flow in  the left profunda femoral arteries. Left SFA is occluded at the origin. Reconstitution of flow in the popliteal artery above the knee. Runoff: Scattered calcifications involving the runoff vessels. No significant flow in the posterior tibial artery. Peroneal artery is patent down to the ankle. Areas of occlusion involving the anterior tibial artery. No significant flow in the dorsalis pedis artery. Veins: No obvious venous abnormality within the limitations of this arterial phase study. Review of the MIP images confirms the above findings. NON-VASCULAR Lower chest: Right heart is enlarged. Contrast refluxes into the hepatic veins and compatible with increased right heart pressures. Lung bases are clear. Hepatobiliary: Normal appearance of the gallbladder. Normal appearance of the liver. Pancreas: Unremarkable. No pancreatic ductal dilatation or surrounding inflammatory changes. Spleen: Normal in size without focal abnormality. Adrenals/Urinary Tract: No gross abnormality to the adrenal glands. 2.2 cm low-density structure in left kidney lower pole is suggestive for a cyst. Small amount of fluid in the urinary bladder. Stomach/Bowel: Stomach is within normal limits. Appendix appears normal. No evidence of bowel wall thickening, distention, or inflammatory changes. Lymphatic: No lymph node enlargement in the abdomen or pelvis. Reproductive: There may be a small uterus but difficult to evaluate on this examination. No evidence for an adnexal mass. Other: Negative for ascites. Musculoskeletal: Multilevel degenerative disc disease in the lumbar spine. IMPRESSION: VASCULAR 1. Left inflow disease demonstrated  by occlusion of the left common iliac artery and left common iliac artery stent. 2. Left outflow disease demonstrated by occlusion of the left SFA. There is reconstituted flow in the left popliteal artery. 3. Left runoff disease. Primary runoff in the left lower extremity is the left peroneal artery. 4. Right amputation above the knee. Occlusive disease involving the right common femoral artery but this area is difficult to evaluate due to heavily calcified plaque. 5. Diffuse atherosclerotic disease in the abdominal aorta. Aortic Atherosclerosis (ICD10-I70.0). 6. 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 7. Enlargement of the right heart with contrast refluxing into the hepatic veins. Findings are suggestive for increased right heart pressures. NON-VASCULAR 1. No acute abnormality in the abdomen or pelvis. Electronically Signed   By: Markus Daft M.D.   On: 11/10/2020 13:04   DG Chest Port 1 View  Result Date: 11/10/2020 CLINICAL DATA:  Fever, weakness EXAM: PORTABLE CHEST 1 VIEW COMPARISON:  10/09/2020 FINDINGS: Heart and mediastinal contours are within normal limits. No focal opacities or effusions. No acute bony abnormality. Aortic atherosclerosis. IMPRESSION: No active disease. Electronically Signed   By: Rolm Baptise M.D.   On: 11/10/2020 10:52   CT Angio Aortobifemoral W and/or Wo Contrast  Final Result    DG Chest Port 1 View  Final Result      Scheduled Meds: . amLODipine  5 mg Oral Daily  . cholecalciferol  1,000 Units Oral Daily  . cycloSPORINE  2 drop Both Eyes BID  . gabapentin  100 mg Oral TID  . latanoprost  1 drop Both Eyes QHS  . losartan  50 mg Oral Daily  . mometasone-formoterol  2 puff Inhalation BID  . pantoprazole  40 mg Oral Daily  . pravastatin  20 mg Oral QHS   PRN Meds: acetaminophen **OR** acetaminophen, albuterol, albuterol, haloperidol lactate, meclizine, ondansetron **OR** ondansetron (ZOFRAN) IV, oxyCODONE,  tiZANidine Continuous Infusions: . heparin 800 Units/hr (11/11/20 1347)     LOS: 0 days  Time spent: Greater than 50% of the 35 minute visit was spent in counseling/coordination of care for the  patient as laid out in the A&P.   Dwyane Dee, MD [facility] 11/11/2020, 2:44 PM

## 2020-11-11 NOTE — Progress Notes (Signed)
11/11/20 1655  PT Visit Information  Last PT Received On 11/11/20  Assistance Needed +2  History of Present Illness 87 y.o. female with medical history significant of HTN, PVD, PAOD, HLD. 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 The family is considering palliative care at home. Per notes, not appropriate for surgical intervention.  Precautions  Precautions Fall  Restrictions  Weight Bearing Restrictions No  Other Position/Activity Restrictions R AKA  Home Living  Family/patient expects to be discharged to: Private residence  [place];Other relatives  Available Help at Discharge Family;Available 24 hours/day  Type of Home Apartment  Home Access Level entry  Home Layout One level  Bathroom Shower/Tub Tub/shower unit;Walk-in Patent examiner - 2 wheels;Wheelchair - manual;Hospital bed;BSC  Prior Function  Level of Independence Needs assistance  Gait / Transfers Assistance Needed Increasing assist needed.  Mod to Max A for basic transfers when pt agreeable to get to [facility]. Otherwise stays in the bed.  ADL's / [place] Increasing assist.  Family has paid caregivers for a few hours a week to assist with bed level ADL.  Communication  Communication HOH  Pain Assessment  Pain Assessment Faces  Faces Pain Scale 6  Pain Location RLE  Pain Descriptors / Indicators Aching;Grimacing;Guarding  Pain Intervention(s) Limited activity within patient's tolerance;Monitored during session;Repositioned  Cognition  Arousal/Alertness Awake/alert  Behavior During Therapy WFL for tasks assessed/performed  Overall Cognitive Status History of cognitive impairments - at baseline  General Comments Dementia at baseline  Upper Extremity Assessment  Upper Extremity Assessment Defer to OT evaluation  Lower Extremity Assessment  Lower Extremity Assessment RLE deficits/detail;LLE  deficits/detail  RLE Deficits / Details R AKA at baseline  LLE Deficits / Details Increased pain throughout LLE.  Cervical / Trunk Assessment  Cervical / Trunk Assessment Kyphotic  Bed Mobility  Overal bed mobility Needs Assistance  Bed Mobility Sit to Supine  Sit to supine Min assist  General bed mobility comments Min A for LLE assist.  Transfers  Overall transfer level Needs assistance  Transfer via Lift Equipment Stedy  Transfers Sit to/from Stand  Sit to Stand Max assist;+2 physical assistance  General transfer comment Max A +2 for lift assist and steadying to stand with stedy. Required use of stedy to get to [facility] and then to bed.  Balance  Overall balance assessment Needs assistance  Sitting-balance support No upper extremity supported  Sitting balance-Leahy Scale Fair  Standing balance support Bilateral upper extremity supported  Standing balance-Leahy Scale Poor  Standing balance comment Reliant on BUE and external support  PT - End of Session  Activity Tolerance Patient limited by pain  Patient left in bed;with call bell/phone within reach;with bed alarm set;with family/visitor present  Nurse Communication Mobility status  PT Assessment  PT Recommendation/Assessment Patent does not need any further PT services  PT Visit Diagnosis Pain;Other abnormalities of gait and mobility (R26.89);Muscle weakness (generalized) (M62.81)  Pain - Right/Left Left  Pain - part of body Leg  No Skilled PT Patient at baseline level of functioning;Patient will have necessary level of assist by caregiver at discharge  AM-PAC PT "6 Clicks" Mobility Outcome Measure (Version 2)  Help needed turning from your back to your side while in a flat bed without using bedrails? 2  Help needed moving from lying on your back to sitting on the side of a flat bed without using bedrails? 2  Help  needed moving to and from a bed to a chair (including a wheelchair)? 1  Help needed standing up from a chair using your  arms (e.g., wheelchair or bedside chair)? 2  Help needed to walk in hospital room? 1  Help needed climbing 3-5 steps with a railing?  1  6 Click Score 9  Consider Recommendation of Discharge To: CIR/SNF/LTACH  PT Recommendation  Follow Up Recommendations Home health PT;Supervision/Assistance - 24 hour (pt's family wanting to take pt home)  PT equipment Other (comment);Hospital bed (hoyer lift with pad)  Acute Rehab PT Goals  Patient Stated Goal Family wants pallaitive services in the home to increase aide assist for self care, hospital bed and continued HH PT,OT,ST if possible.  PT Goal Formulation With family  Time For Goal Achievement 11/11/20  Potential to Achieve Goals Fair  PT Time Calculation  PT Start Time (ACUTE ONLY) 1605  PT Stop Time (ACUTE ONLY) 1643  PT Time Calculation (min) (ACUTE ONLY) 38 min  PT General Charges  $$ ACUTE PT VISIT 1 Visit  PT Evaluation  $PT Eval Moderate Complexity 1 Mod  PT Treatments  $Therapeutic Activity 23-37 mins   Pt admitted secondary to problem above with deficits above. Required use of stedy to perform transfers. Max A +2 required to stand. Per pt's family, pt has been requiring increased assist for mobility tasks and has been spending most of her time in bed. Requires max A to get out of bed into WC. Pt close to baseline and family wanting to take pt home with increased services if possible. Recommend HHPT and 24/7 assist at d/c. No further acute PT needs. Will sign off. If needs change, please re-consult.   Reuel Derby, PT, DPT  Acute Rehabilitation Services  Pager: [phone] Office: [phone]

## 2020-11-11 NOTE — Progress Notes (Signed)
[place] for Heparin Indication: PVD, possible ischemic foot  Allergies  Allergen Reactions  . Aspirin Anaphylaxis  . Morphine And Related Other (See Comments)    confusion    Patient Measurements: Height: 5' (152.4 cm) Weight: 67 kg (147 lb 11.3 oz) IBW/kg (Calculated) : 45.5 Heparin Dosing Weight: 59.9 kg  Vital Signs: Temp: 98.1 F (36.7 C) (04/14 0800) Temp Source: Oral (04/14 0800) BP: 124/63 (04/14 0800) Pulse Rate: 70 (04/14 0800)  Labs: Recent Labs    11/10/20 1027 11/10/20 1241 11/10/20 2314 11/11/20 0538  HGB 15.2*  --   --  11.9*  HCT 45.5  --   --  35.4*  PLT 337  --   --  268  APTT 34  --   --   --   LABPROT 13.6  --   --   --   INR 1.0  --   --   --   HEPARINUNFRC  --   --  0.58 0.54  CREATININE 0.97  --   --  0.94  TROPONINIHS 38* 36*  --   --     Estimated Creatinine Clearance: 34 mL/min (by C-G formula based on SCr of 0.94 mg/dL).   Assessment: 87 yo female with hx of PVD s/p amputation of RLE and L common iliac stenting admitted with L leg pain.  CT shows occlusions of the left common iliac and SFA.  Pharmacy is consulted to dose IV heparin.  Baseline coags and CBC wnl, takes plavix.  Heparin level therapeutic (0.54) on gtt at 800 units/hr. No bleeding noted.    Goal of Therapy:  Heparin level 0.3-0.7 units/ml Monitor platelets by anticoagulation protocol: Yes   Plan:  Continue heparin 800 units/hr IV  F/u daily heparin level  Erin Hearing PharmD., BCPS Clinical Pharmacist 11/11/2020 1:09 PM

## 2020-11-11 NOTE — Progress Notes (Signed)
Patient's granddaughter called this RN into the room to assess the patient's mental status. Patient woke up and became hostile. Patient states that, she doesn't like to be trapped and wants to be able to get to the [facility] by herself. Staff told the patient that she should call before she gets up so staff member can help her. Patient continued to be angry and aggravated towards staff. Patient stated that, she doesn't sleep where there is any electricity. Patient was told that the heart monitor was for [place] to keep an eye on heart. The call bell was for if she needed any help.   Granddaughter stated that the patient hasn't been this aggressive for a few weeks. Granddaughter questioning if her medicines are making her this way. Patient was given scheduled meds around 9 and there was nothing different that she takes other than the oxycodone.   Bed alarm on. Staff member near room. Will continue to monitor

## 2020-11-11 NOTE — Care Management Obs Status (Signed)
[place] NOTIFICATION   Patient Details  Name: ANEESAH HERNAN MRN: [id number] Date of Birth: [birth year]   Medicare Observation Status Notification Given:  Yes    Dawayne Patricia, RN 11/11/2020, 5:05 PM

## 2020-11-12 DIAGNOSIS — Z923 Personal history of irradiation: Secondary | ICD-10-CM | POA: Diagnosis not present

## 2020-11-12 DIAGNOSIS — K551 Chronic vascular disorders of intestine: Secondary | ICD-10-CM | POA: Diagnosis present

## 2020-11-12 DIAGNOSIS — R4182 Altered mental status, unspecified: Secondary | ICD-10-CM

## 2020-11-12 DIAGNOSIS — Z89611 Acquired absence of right leg above knee: Secondary | ICD-10-CM | POA: Diagnosis not present

## 2020-11-12 DIAGNOSIS — Z87891 Personal history of nicotine dependence: Secondary | ICD-10-CM | POA: Diagnosis not present

## 2020-11-12 DIAGNOSIS — R54 Age-related physical debility: Secondary | ICD-10-CM | POA: Diagnosis present

## 2020-11-12 DIAGNOSIS — Z7189 Other specified counseling: Secondary | ICD-10-CM | POA: Diagnosis not present

## 2020-11-12 DIAGNOSIS — E440 Moderate protein-calorie malnutrition: Secondary | ICD-10-CM | POA: Diagnosis present

## 2020-11-12 DIAGNOSIS — I7090 Unspecified atherosclerosis: Secondary | ICD-10-CM | POA: Diagnosis present

## 2020-11-12 DIAGNOSIS — Z800 Family history of malignant neoplasm of digestive organs: Secondary | ICD-10-CM | POA: Diagnosis not present

## 2020-11-12 DIAGNOSIS — Z85118 Personal history of other malignant neoplasm of bronchus and lung: Secondary | ICD-10-CM | POA: Diagnosis not present

## 2020-11-12 DIAGNOSIS — I739 Peripheral vascular disease, unspecified: Secondary | ICD-10-CM | POA: Diagnosis not present

## 2020-11-12 DIAGNOSIS — I998 Other disorder of circulatory system: Secondary | ICD-10-CM | POA: Diagnosis not present

## 2020-11-12 DIAGNOSIS — Z515 Encounter for palliative care: Secondary | ICD-10-CM

## 2020-11-12 DIAGNOSIS — Z20822 Contact with and (suspected) exposure to covid-19: Secondary | ICD-10-CM | POA: Diagnosis present

## 2020-11-12 DIAGNOSIS — J449 Chronic obstructive pulmonary disease, unspecified: Secondary | ICD-10-CM | POA: Diagnosis present

## 2020-11-12 DIAGNOSIS — R778 Other specified abnormalities of plasma proteins: Secondary | ICD-10-CM | POA: Diagnosis present

## 2020-11-12 DIAGNOSIS — Z66 Do not resuscitate: Secondary | ICD-10-CM | POA: Diagnosis present

## 2020-11-12 DIAGNOSIS — I70222 Atherosclerosis of native arteries of extremities with rest pain, left leg: Secondary | ICD-10-CM | POA: Diagnosis present

## 2020-11-12 DIAGNOSIS — Y831 Surgical operation with implant of artificial internal device as the cause of abnormal reaction of the patient, or of later complication, without mention of misadventure at the time of the procedure: Secondary | ICD-10-CM | POA: Diagnosis present

## 2020-11-12 DIAGNOSIS — T82898A Other specified complication of vascular prosthetic devices, implants and grafts, initial encounter: Secondary | ICD-10-CM | POA: Diagnosis present

## 2020-11-12 DIAGNOSIS — Z6828 Body mass index (BMI) 28.0-28.9, adult: Secondary | ICD-10-CM | POA: Diagnosis not present

## 2020-11-12 DIAGNOSIS — Z825 Family history of asthma and other chronic lower respiratory diseases: Secondary | ICD-10-CM | POA: Diagnosis not present

## 2020-11-12 DIAGNOSIS — Z886 Allergy status to analgesic agent status: Secondary | ICD-10-CM | POA: Diagnosis not present

## 2020-11-12 DIAGNOSIS — I10 Essential (primary) hypertension: Secondary | ICD-10-CM | POA: Diagnosis present

## 2020-11-12 DIAGNOSIS — Z885 Allergy status to narcotic agent status: Secondary | ICD-10-CM | POA: Diagnosis not present

## 2020-11-12 DIAGNOSIS — E785 Hyperlipidemia, unspecified: Secondary | ICD-10-CM | POA: Diagnosis present

## 2020-11-12 DIAGNOSIS — Z8673 Personal history of transient ischemic attack (TIA), and cerebral infarction without residual deficits: Secondary | ICD-10-CM | POA: Diagnosis not present

## 2020-11-12 DIAGNOSIS — Z823 Family history of stroke: Secondary | ICD-10-CM | POA: Diagnosis not present

## 2020-11-12 LAB — CBC
HCT: 35.7 % — ABNORMAL LOW (ref 36.0–46.0)
Hemoglobin: 11.7 g/dL — ABNORMAL LOW (ref 12.0–15.0)
MCH: 31.4 pg (ref 26.0–34.0)
MCHC: 32.8 g/dL (ref 30.0–36.0)
MCV: 95.7 fL (ref 80.0–100.0)
Platelets: 257 10*3/uL (ref 150–400)
RBC: 3.73 MIL/uL — ABNORMAL LOW (ref 3.87–5.11)
RDW: 14.1 % (ref 11.5–15.5)
WBC: 4.5 10*3/uL (ref 4.0–10.5)
nRBC: 0 % (ref 0.0–0.2)

## 2020-11-12 LAB — HEPARIN LEVEL (UNFRACTIONATED): Heparin Unfractionated: 0.36 IU/mL (ref 0.30–0.70)

## 2020-11-12 MED ORDER — OXYCODONE HCL 5 MG PO TABS: 5.0000 mg | ORAL_TABLET | ORAL | Status: DC | PRN

## 2020-11-12 MED ORDER — ENSURE ENLIVE PO LIQD
237.0000 mL | Freq: Two times a day (BID) | ORAL | Status: DC
  Administered 2020-11-13 – 2020-11-17 (×7): 237 mL via ORAL

## 2020-11-12 MED ORDER — GABAPENTIN 100 MG PO CAPS: 200.0000 mg | ORAL_CAPSULE | Freq: Three times a day (TID) | ORAL | Status: DC

## 2020-11-12 MED ORDER — DICLOFENAC SODIUM 1 % EX GEL
2.0000 g | Freq: Four times a day (QID) | CUTANEOUS | Status: DC
  Administered 2020-11-12 – 2020-11-20 (×24): 2 g via TOPICAL
  Filled 2020-11-12: qty 100

## 2020-11-12 MED ORDER — GABAPENTIN 100 MG PO CAPS
200.0000 mg | ORAL_CAPSULE | Freq: Three times a day (TID) | ORAL | Status: DC
  Administered 2020-11-12 – 2020-11-15 (×10): 200 mg via ORAL
  Administered 2020-11-16: 100 mg via ORAL
  Administered 2020-11-16 – 2020-11-20 (×12): 200 mg via ORAL
  Filled 2020-11-12 (×24): qty 2

## 2020-11-12 MED ORDER — OXYCODONE HCL 5 MG PO TABS
5.0000 mg | ORAL_TABLET | ORAL | Status: DC
  Administered 2020-11-12 – 2020-11-13 (×3): 5 mg via ORAL
  Filled 2020-11-12 (×4): qty 1

## 2020-11-12 MED ORDER — OXYCODONE HCL 5 MG PO TABS
2.5000 mg | ORAL_TABLET | ORAL | Status: DC | PRN
  Administered 2020-11-13 – 2020-11-14 (×3): 2.5 mg via ORAL
  Filled 2020-11-12 (×3): qty 1

## 2020-11-12 NOTE — Progress Notes (Signed)
This chaplain responded to PMT spiritual care consult for the Pt. grand daughter- [name].  The chaplain and [name] moved to the [facility] area to talk.  The chaplain offered emotional support through reflective listening as [name] shared stories about her mother and the Pt.   [name] honors her relationship with the Pt. as [name] mother often made choices that led to times of separation. The chaplain understands [name] is holding the anticipatory grief and the commitment to making sure the Pt. receives the best care possible.  [name] has promised EOL care to the Pt. at home and is grateful for the Hospice consult. The chaplain recommends continued quality communication throughout this hospitalization.  This chaplain is available for F/U spiritual care as needed.

## 2020-11-12 NOTE — Progress Notes (Signed)
   I discussed patient's vascular issues with the patient and family.  Unfortunately she does not appear to be amenable to left lower extremity revascularization.  Agree with palliative care consult.  Vascular surgery will be available as needed  Damier Disano C. Donzetta Matters, MD [facility] Office: [phone] Pager: [phone]

## 2020-11-12 NOTE — Evaluation (Signed)
Clinical/Bedside Swallow Evaluation Patient Details  Name: [name] MRN: [id number] Date of Birth: May 04, 1928  Today's Date: 11/12/2020 Time: SLP Start Time (ACUTE ONLY): 20 SLP Stop Time (ACUTE ONLY): 0938 SLP Time Calculation (min) (ACUTE ONLY): 23 min  Past Medical History:  Past Medical History:  Diagnosis Date  . Arthritis   . Cancer ([place])    rt lung  . COPD GOLD I 04/18/2013  . Glaucoma   . Hyperlipidemia   . Hypertension   . Lung cancer ([place]) 06/07/2013  . Peripheral vascular disease, unspecified ([place])    with Claudication  . S/P radiation therapy 12/12, 12/15, and 07/19/15   The LLL nodule was treated to 54 Gy in 3 fractions of 18 Gy  . Shortness of breath   . Stroke [facility])    Past Surgical History:  Past Surgical History:  Procedure Laterality Date  . ABOVE KNEE LEG AMPUTATION     Right  . ANGIOPLASTY / STENTING ILIAC  03/22/09   Aortogram-  left common iliac artery  . COLONOSCOPY  Feb. 2013  . HEMORRHOID SURGERY    . TONSILLECTOMY    . VIDEO ASSISTED THORACOSCOPY (VATS)/WEDGE RESECTION Right 06/16/2013   Procedure: VIDEO ASSISTED THORACOSCOPY (VATS)/WEDGE RESECTION;  Surgeon: [name], MD;  Location: [facility];  Service: Thoracic;  Laterality: Right;  Marland Kitchen VIDEO BRONCHOSCOPY Bilateral 04/10/2013   Procedure: VIDEO BRONCHOSCOPY WITH FLUORO;  Surgeon: Tanda Rockers, MD;  Location: [facility];  Service: Cardiopulmonary;  Laterality: Bilateral;  . VIDEO BRONCHOSCOPY N/A 06/16/2013   Procedure: VIDEO BRONCHOSCOPY;  Surgeon: [name], MD;  Location: [facility] OR;  Service: Thoracic;  Laterality: N/A;   HPI:  87 y.o. female with medical history significant of HTN, PVD, PAOD, HLD. Presenting with left leg pain. CXR on 11/10/20 indicated no active disease.  Assessment / Plan / Recommendation Clinical Impression  Pt seen for a clinical swallowing evaluation with a normal oropharyngeal swallow noted for age (taking into consideration presbyphagia).  Pt without  overt s/s of aspiration throughout evaluation and timely, brisk swallow observed.  Granddaughter reports she has always "taken one pill at a time" at home.  Nursing reported difficulty with pill consumption x1 if not taken one at a time.  Recommend continuing regular/thin liquid diet with pills given with liquids one at a time for safety purposes.  ST will s/o at this time.  Thank you for this referral. SLP Visit Diagnosis: Dysphagia, unspecified (R13.10)    Aspiration Risk  No limitations    Diet Recommendation   Regular consistency; thin liquids  Medication Administration: Whole meds with liquid (1 pill at a time)    Other  Recommendations Oral Care Recommendations: Oral care BID   Follow up Recommendations None      Frequency and Duration   Evaluation only         Prognosis Prognosis for Safe Diet Advancement: Good      Swallow Study   General Date of Onset: 11/10/20 HPI: 87 y.o. female with medical history significant of HTN, PVD, PAOD, HLD. Presenting with left leg pain. Type of Study: Bedside Swallow Evaluation Previous Swallow Assessment: n/a Diet Prior to this Study: Regular;Thin liquids Temperature Spikes Noted: No Respiratory Status: Room air History of Recent Intubation: No Behavior/Cognition: Alert;Requires cueing;Other (Comment) (HOH) Oral Cavity Assessment: Within Functional Limits Oral Care Completed by SLP: Recent completion by staff Oral Cavity - Dentition: Dentures, top;Dentures, bottom Vision: Functional for self-feeding Self-Feeding Abilities: Able to feed self Patient Positioning: Upright in bed  Baseline Vocal Quality: Normal Volitional Cough: Strong Volitional Swallow: Able to elicit    Oral/Motor/Sensory Function Overall Oral Motor/Sensory Function: Within functional limits   Ice Chips Ice chips: Not tested   Thin Liquid Thin Liquid: Within functional limits Presentation: Straw    Nectar Thick Nectar Thick Liquid: Not tested   Honey Thick Honey  Thick Liquid: Not tested   Puree Puree: Within functional limits Presentation: Spoon   Solid     Solid: Within functional limits Presentation: Self Fed      [name], M.S., CCC-SLP 11/12/2020,9:42 AM

## 2020-11-12 NOTE — Progress Notes (Signed)
Progress Note    [name]   WRU:[id number]  DOB: [birth year]  DOA: 11/10/2020     0  PCP: [name], MD  CC: left leg pain   Hospital Course: [name] is a 87 y.o. female with medical history significant of HTN, PVD, PAOD, HLD. Presenting with left leg pain. Hx from granddtr at bedside. Her granddtr reports that the patient has been complaining of pain more frequently at home and having more difficulty with ambulation.  Over the past several weeks to months, her granddaughter has noticed an overall gradual decline in the patient's ability to thrive in general.  She also notes that the patient has been becoming more confused/forgetful at times (example provided was she is now forgetting to pay bills which was never an issue in the past).  She also has been more dependent recently on pain medications due to the ongoing pain, spasms, cramping in her left leg.  She is also not eating as much as previously due to pain associated with eating as well at times.  She was admitted for vascular surgery evaluation. After evaluation, her occlusive disease was considered rather remarkable and intervention would be high risk with the patient's underlying comorbidities, frailty, and overall debility.  Palliative care was recommended.   Interval History:  Granddaughter bedside this morning.  They have had multiple discussions since yesterday; at this point, general decision is for bringing the patient home with hospice.  Other option would be sending her to SNF however patient does wish to return home if able. Long discussion again held bedside regarding overall plan with pain control.  Granddaughter exhibits some bargaining with asking for IV heparin use at home or IV pain medications.  Gently clarified this is not standard of care and goal especially with hospice would be to stabilize patient on an oral pain regimen prior to discharge. For now, will continue on heparin drip while remaining in  hospital while granddaughter prepares home for patient to return.  ROS: Constitutional: negative for chills and fevers, Respiratory: negative for cough, Cardiovascular: negative for chest pain and Gastrointestinal: positive for abdominal pain  Assessment & Plan:  Severe PVD - patient has occluded celiac trunk with retrograde flow from SMA and PDA branches; calcified plaque at aortic bifurcation and origin of right CIA; right EIA heavily calcified and small; junction of right EIA and proximal CFA has high-grade stenosis w/ short segment occlusion; calcified plaque in right CFA; no significant flow in right SFA (and R AKA noted); Left CIA is stented and occluded; left EIA has diffuse atherosclerotic disease; left CFA is heavily calcified but patent; left SFA occluded at origin (reconstitution of flow in popliteal artery above-knee); no significant flow in PTA; peroneal artery patent to ankle; areas of occlusion involving anterior tibial artery noted; no significant flow in dorsalis pedis artery -Has been evaluated by vascular surgery.  Intervention would require overly aggressive measures (starting in right brachial artery possibly) - vascular surgery and myself have dicussed that palliative care is more appropriate at this time and intervention may be more harm than good at this point; patient is also starting to exhibit some signs of dementia per her granddaughter and she has been also not eating as much probably in part due to her mesenteric ischemia as well -Granddaughter and patient undecided on bringing her home with hospice -Continue heparin drip for now while in hospital given patient associating pain relief with this -Continue oxycodone and modify as needed -Palliative care now following  and has had family discussion on 11/12/2020.  Greatly appreciate assistance.  Plan as noted will be for home with hospice  HTN -Continue amlodipine, losartan  HLD -On pravastatin currently.  Does have  severe PAD however mortality benefit from statin questionable at this point; if going to pursue less aggressive measures, will probably discontinue statin at that time  Hx NSCLC  - stable; continue inhalers   Elevated troponin  - flat trend; low suspicion for ACS at this nor would be a good candidate - continue heparin drip for now   Old records reviewed in assessment of this patient  Antimicrobials: n/a  DVT prophylaxis: Heparin drip    Code Status:   Code Status: DNR Family Communication: granddaughter   Disposition Plan: Status is: Observation  The patient will require care spanning > 2 midnights and should be moved to inpatient because: Ongoing active pain requiring inpatient pain management, IV treatments appropriate due to intensity of illness or inability to take PO and Inpatient level of care appropriate due to severity of illness  Dispo: The patient is from: Home              Anticipated d/c is to: Home with hospice              Patient currently is not medically stable to d/c.   Difficult to place patient No  Risk of unplanned readmission score:     Objective: Blood pressure 137/66, pulse 71, temperature 97.6 F (36.4 C), temperature source Oral, resp. rate 18, height 5' (1.524 m), weight 67 kg, SpO2 99 %.  Examination: General appearance: Chronically ill-appearing elderly woman laying in bed appearing uncomfortable but no acute distress Head: Normocephalic, without obvious abnormality, atraumatic Eyes: EOMI Lungs: clear to auscultation bilaterally Heart: regular rate and rhythm and S1, S2 normal Abdomen: Thin, nonspecific tenderness to palpation, bowel sounds present Extremities: Cool left foot to the touch with darkening of skin on toes.  Unable to palpate DP/PT pulses.  No edema noted.  No ulcers noted.  Right AKA noted.  Left leg tender to palpation throughout Skin: mobility and turgor normal Neurologic: follows commands, moves all 4 extremities    Consultants:   Vascular surgery  Procedures:   n/a  Data Reviewed: I have personally reviewed following labs and imaging studies Results for orders placed or performed during the hospital encounter of 11/10/20 (from the past 24 hour(s))  Heparin level (unfractionated)     Status: None   Collection Time: 11/12/20  2:28 AM  Result Value Ref Range   Heparin Unfractionated 0.36 0.30 - 0.70 IU/mL  CBC     Status: Abnormal   Collection Time: 11/12/20  2:28 AM  Result Value Ref Range   WBC 4.5 4.0 - 10.5 K/uL   RBC 3.73 (L) 3.87 - 5.11 MIL/uL   Hemoglobin 11.7 (L) 12.0 - 15.0 g/dL   HCT 35.7 (L) 36.0 - 46.0 %   MCV 95.7 80.0 - 100.0 fL   MCH 31.4 26.0 - 34.0 pg   MCHC 32.8 30.0 - 36.0 g/dL   RDW 14.1 11.5 - 15.5 %   Platelets 257 150 - 400 K/uL   nRBC 0.0 0.0 - 0.2 %    Recent Results (from the past 240 hour(s))  Culture, blood (routine x 2)     Status: None (Preliminary result)   Collection Time: 11/10/20  9:55 AM   Specimen: BLOOD  Result Value Ref Range Status   Specimen Description   Final  BLOOD LEFT ANTECUBITAL Performed at [facility] [address], [place], [place] [address]    Special Requests   Final    BOTTLES DRAWN AEROBIC AND ANAEROBIC Blood Culture results may not be optimal due to an inadequate volume of blood received in culture bottles Performed at [facility] [address] [place], [place] [address]    Culture   Final    NO GROWTH 2 DAYS Performed at [facility] [address], [place], [place] [address]    Report Status PENDING  Incomplete  Culture, blood (routine x 2)     Status: None (Preliminary result)   Collection Time: 11/10/20 10:27 AM   Specimen: BLOOD  Result Value Ref Range Status   Specimen Description   Final    BLOOD BLOOD RIGHT FOREARM Performed at [facility] [address] [place], [place] [address]    Special Requests   Final    BOTTLES DRAWN AEROBIC AND  ANAEROBIC Blood Culture adequate volume Performed at [facility] [address] [place], [place] [address]    Culture   Final    NO GROWTH 2 DAYS Performed at [facility] [address] [place], [place] [address]    Report Status PENDING  Incomplete  Resp Panel by RT-PCR (Flu A&B, Covid) Nasopharyngeal Swab     Status: None   Collection Time: 11/10/20  1:33 PM   Specimen: Nasopharyngeal Swab; Nasopharyngeal(NP) swabs in vial transport medium  Result Value Ref Range Status   SARS Coronavirus 2 by RT PCR NEGATIVE NEGATIVE Final    Comment: (NOTE) SARS-CoV-2 target nucleic acids are NOT DETECTED.  The SARS-CoV-2 RNA is generally detectable in upper respiratory specimens during the acute phase of infection. The lowest concentration of SARS-CoV-2 viral copies this assay can detect is 138 copies/mL. A negative result does not preclude SARS-Cov-2 infection and should not be used as the sole basis for treatment or other patient management decisions. A negative result may occur with  improper specimen collection/handling, submission of specimen other than nasopharyngeal swab, presence of viral mutation(s) within the areas targeted by this assay, and inadequate number of viral copies(<138 copies/mL). A negative result must be combined with clinical observations, patient history, and epidemiological information. The expected result is Negative.  Fact Sheet for Patients:  EntrepreneurPulse.com.au  Fact Sheet for Healthcare Providers:  IncredibleEmployment.be  This test is no t yet approved or cleared by the [place] FDA and  has been authorized for detection and/or diagnosis of SARS-CoV-2 by FDA under an Emergency Use Authorization (EUA). This EUA will remain  in effect (meaning this test can be used) for the duration of the COVID-19 declaration under Section 564(b)(1) of the Act, 21 U.S.C.section 360bbb-3(b)(1),  unless the authorization is terminated  or revoked sooner.       Influenza A by PCR NEGATIVE NEGATIVE Final   Influenza B by PCR NEGATIVE NEGATIVE Final    Comment: (NOTE) The Xpert Xpress SARS-CoV-2/FLU/RSV plus assay is intended as an aid in the diagnosis of influenza from Nasopharyngeal swab specimens and should not be used as a sole basis for treatment. Nasal washings and aspirates are unacceptable for Xpert Xpress SARS-CoV-2/FLU/RSV testing.  Fact Sheet for Patients: EntrepreneurPulse.com.au  Fact Sheet for Healthcare Providers: IncredibleEmployment.be  This test is not yet approved or cleared by the [place] FDA and has been authorized for detection and/or diagnosis of SARS-CoV-2 by FDA under an Emergency Use Authorization (EUA).  This EUA will remain in effect (meaning this test can be used) for the duration of the COVID-19 declaration under Section 564(b)(1) of the Act, 21 U.S.C. section 360bbb-3(b)(1), unless the authorization is terminated or revoked.  Performed at [facility], [place] [address] [place],  [address]      Radiology Studies: No results found. CT Angio Aortobifemoral W and/or Wo Contrast  Final Result    DG Chest Port 1 View  Final Result      Scheduled Meds: . amLODipine  5 mg Oral Daily  . cholecalciferol  1,000 Units Oral Daily  . cycloSPORINE  2 drop Both Eyes BID  . diclofenac Sodium  2 g Topical QID  . gabapentin  200 mg Oral TID  . latanoprost  1 drop Both Eyes QHS  . losartan  50 mg Oral Daily  . mometasone-formoterol  2 puff Inhalation BID  . oxyCODONE  5 mg Oral Q4H  . pantoprazole  40 mg Oral Daily  . pravastatin  20 mg Oral QHS   PRN Meds: acetaminophen **OR** acetaminophen, albuterol, albuterol, haloperidol lactate, meclizine, ondansetron **OR** ondansetron (ZOFRAN) IV, oxyCODONE, tiZANidine Continuous Infusions: . heparin 800 Units/hr (11/12/20 1158)      LOS: 0 days  Time spent: Greater than 50% of the 35 minute visit was spent in counseling/coordination of care for the patient as laid out in the A&P.   Dwyane Dee, MD [facility] 11/12/2020, 1:08 PM

## 2020-11-12 NOTE — Progress Notes (Signed)
[facility] for Heparin Indication: PVD, possible ischemic foot  Allergies  Allergen Reactions  . Aspirin Anaphylaxis  . Morphine And Related Other (See Comments)    confusion    Patient Measurements: Height: 5' (152.4 cm) Weight: 67 kg (147 lb 11.3 oz) IBW/kg (Calculated) : 45.5 Heparin Dosing Weight: 59.9 kg  Vital Signs: Temp: 97.6 F (36.4 C) (04/15 0345) Temp Source: Oral (04/15 0345) BP: 157/66 (04/15 0345) Pulse Rate: 67 (04/15 0345)  Labs: Recent Labs    11/10/20 1027 11/10/20 1241 11/10/20 2314 11/11/20 0538 11/12/20 0228  HGB 15.2*  --   --  11.9* 11.7*  HCT 45.5  --   --  35.4* 35.7*  PLT 337  --   --  268 257  APTT 34  --   --   --   --   LABPROT 13.6  --   --   --   --   INR 1.0  --   --   --   --   HEPARINUNFRC  --   --  0.58 0.54 0.36  CREATININE 0.97  --   --  0.94  --   TROPONINIHS 38* 36*  --   --   --     Estimated Creatinine Clearance: 34 mL/min (by C-G formula based on SCr of 0.94 mg/dL).   Assessment: 87 yo female with hx of PVD s/p amputation of RLE and L common iliac stenting admitted with L leg pain.  CT shows occlusions of the left common iliac and SFA.  Pharmacy is consulted to dose IV heparin.  Baseline coags and CBC wnl, takes plavix.  Heparin level therapeutic (0.36) on gtt at 800 units/hr, appears that infusion was paused a couple times overnight so that could explain the drop in heparin level. No bleeding noted. No adjustments warranted at this time.   Goal of Therapy:  Heparin level 0.3-0.7 units/ml Monitor platelets by anticoagulation protocol: Yes   Plan:  Continue heparin 800 units/hr  F/u daily heparin level  Erin Hearing PharmD., BCPS Clinical Pharmacist 11/12/2020 8:13 AM

## 2020-11-12 NOTE — Plan of Care (Signed)

## 2020-11-12 NOTE — Progress Notes (Signed)
Initial Nutrition Assessment  DOCUMENTATION CODES:   Non-severe (moderate) malnutrition in context of chronic illness  INTERVENTION:   - Liberalize diet to Regular  - Ensure Enlive po BID, each supplement provides 350 kcal and 20 grams of protein  - Magic Cup BID with lunch and dinner meals, each supplement provides 290 kcal and 9 grams of protein  - Feeding assistance with meals  NUTRITION DIAGNOSIS:   Moderate Malnutrition related to chronic illness (severe PVD, COPD, dementia) as evidenced by moderate muscle depletion,moderate fat depletion.  GOAL:   Patient will meet greater than or equal to 90% of their needs  MONITOR:   PO intake,Supplement acceptance,Weight trends  REASON FOR ASSESSMENT:   Malnutrition Screening Tool    ASSESSMENT:   87 year old female who presented to the ED on 4/13 with weakness and foot pain. PMH of non-small cell lung cancer, HTN, PVD s/p right AKA, HLD, PAOD, COPD, dementia.   Per notes, pt with severe PVD and plan may be to bring the pt home with hospice but this has not yet been decided at this time. Palliative consult pending. Notes indicate that pt has not had a good appetite or good PO intake for a while. No meal completions charted this admission.  Attempted to speak with pt at bedside. Pt sleeping soundly and only awoke briefly during NFPE. No family present at time of RD visit so unable to obtain further history at this time.  Based on NFPE, pt meets criteria for malnutrition. Discussed diet liberalization with MD who agreed. Regular diet ordered. RD will also order oral nutrition supplements to aid pt in meeting kcal and protein needs.  Medications reviewed and include: cholecalciferol, protonix, heparin  Labs reviewed: sodium 132  NUTRITION - FOCUSED PHYSICAL EXAM:  Flowsheet Row Most Recent Value  Orbital Region Moderate depletion  Upper Arm Region Moderate depletion  Thoracic and Lumbar Region Mild depletion  Buccal Region  Mild depletion  Temple Region Moderate depletion  Clavicle Bone Region Moderate depletion  Clavicle and Acromion Bone Region Moderate depletion  Scapular Bone Region Unable to assess  Dorsal Hand Moderate depletion  Patellar Region Moderate depletion  Anterior Thigh Region Moderate depletion  Posterior Calf Region Mild depletion  Edema (RD Assessment) None  Hair Reviewed  Eyes Reviewed  Mouth Reviewed  Skin Reviewed  Nails Reviewed      Diet Order:   Diet Order            Diet regular Room service appropriate? Yes; Fluid consistency: Thin  Diet effective now                 EDUCATION NEEDS:   Not appropriate for education at this time  Skin:  Skin Assessment: Reviewed RN Assessment  Last BM:  11/10/20 small type 7  Height:   Ht Readings from Last 1 Encounters:  11/10/20 5' (1.524 m)    Weight:   Wt Readings from Last 1 Encounters:  11/10/20 67 kg    BMI:  Body mass index is 28.85 kg/m.  Estimated Nutritional Needs:   Kcal:  1300-1500  Protein:  65-80 grams  Fluid:  1.3-1.5 L    Gustavus Bryant, MS, RD, LDN Inpatient Clinical Dietitian Please see AMiON for contact information.

## 2020-11-12 NOTE — Consult Note (Signed)
Consultation Note Date: 11/12/2020   Patient Name: [name]  DOB: 26-Aug-1927  MRN: [id number]  Age / Sex: 87 y.o., female  PCP: [name], MD Referring Physician: Dwyane Dee, MD  Reason for Consultation: Establishing goals of care  HPI/Patient Profile: 87 y.o. female  with past medical history of HTN, PVD, PAOD, HLD, COPD, right AKA, lung cancer s/p radiation therapy admitted on 11/10/2020 with left leg pain.   She was found to have occlusions of the left common iliac and SFA. EDP spoke with Vascular surgery (Dr. Carlis Abbott). The patient was started on heparin gtt w/ rec to be admitted to [facility]. Patient is a poor candidate for revascularization. Palliative care has been consulted to assist with determining goals of care.   Clinical Assessment and Goals of Care:  I have reviewed medical records including EPIC notes, labs and imaging, received report from RN, assessed the patient and then met at the bedside along with granddaughter/HCPOA [name]  to discuss diagnosis, prognosis, GOC, EOL wishes, disposition and options.  I introduced Palliative Medicine as specialized medical care for people living with serious illness. It focuses on providing relief from the symptoms and stress of a serious illness. The goal is to improve quality of life for both the patient and the family.  I attempted to elicit values and goals of care important to the patient.    We discussed a brief life review of the patient and then focused on their current illness. The natural disease trajectory and expectations at EOL were discussed. Patient is described as very determined, full of life, and aggressive at times when feeling like she is abandoned. [name] shares that the patient has been very private with her health conditions and was hesitant to share details. [name] thus feels blindsided by how rapidly patient has declined in recent  days. Patient previously worked as a Surveyor, minerals and in Camera operator, instilling the value of hard work and Environmental health practitioner to open her own [address] [place] named after the patient. This influences [name]'s methodical approach to determining the best coarse of action and plan of care for the patient. Granddaughter's primary goals are to give patient a chance to participate in PT evaluation, get the patient home, improve pain management, and ensure patient's comfort/dignity. She hopes to discover whether patient's prosthesis is at [facility] or patient's home, so she may attempt to ambulate with inpatient support.  Patient was living at home alone on a 3rd floor apartment, with a CNA/personal care aid that assisted her for a few hours daily. [name] stopped by several times and her mother has been able to help 2-3 days per week, but struggles to see patient in current state due to her own mental health. Patient mainly sits in bed and watches TV. [name] notes her gradual decline is consistent with undiagnosed dementia. There was an evaluation schedule for June.   [name] understands that patient is a poor candidate for surgical interventions and now needs much more support and care to remain in her own  home as she desired. [name] has been working with Janeece Riggers to maximize hours of personal care. She reporting an excellent conversation with Freddie Breech of [facility] hosp[facility] yesterday, agreeing that this additional support in the home would be in alignment with patient's goals and preferences. She plans to discuss with [facility] to arrange for both insurance-covered and private pay caregivers. She is also worried about uncertainties with how long she can afford this.  We also discussed the option of SNF for rehab or hospice. Education provided regarding inability for patient to remain on IV management at SNF. We discussed the difficulty of securing SNF placement given goals of care are comfort and  transition to hospice at discharge. [name] understands this, and notes preference for SNF in [place] as a last resort if she is unable to safely arrange caregivers for the patient. She would be open to a short-term rehab attempt if needed to give time for coordination but does prefer home with hospice.  The difference between aggressive medical intervention and comfort care was considered in light of the patient's goals of care.   Advanced directives, concepts specific to code status, artifical feeding and hydration, and rehospitalization were considered and discussed. Educated thoroughly on the risks and high potential for more harm than benefit if CPR was desired. We attempted to discuss with patient, but [name] soon realizes patient is unable to make informed decision given her confusion and difficulty remaining on task. She accepts my recommendation for DNR order and restates her focus is patient's comfort and quality of life.  Hospice and Palliative Care services outpatient were explained and offered.  Discussed the importance of continued conversation with family and the medical providers regarding overall plan of care and treatment options, ensuring decisions are within the context of the patient's values and GOCs.    Questions and concerns were addressed.  Hard Choices booklet left for review. The family was encouraged to call with questions or concerns.  PMT will continue to support holistically.   HCPOA /granddaughter [name] is primary Media planner.    SUMMARY OF RECOMMENDATIONS   -Patient is now DNR/DNI -Continue current interventions for stabilization, no surgical interventions -Granddaughter/HCPOA confirms goal is d/c home with hospice, prefers [facility] and will arrange an evaluation with Freddie Breech. SNF as last resort if unable to care for pt safetly -HCPOA is also arranging for additional caregivers for 24/7 support in patient's home -She would like to find patient's  prosthesis and attempt PT for mobility evaluation while continuing stabilization efforts as inpatient -Discussed [name]'s inquires on podiatry and dementia diagnosis/evaluation as inpatient -Scheduled 26m Oxycodone IR Q4H, 2.544mPRN Q2H for breakthrough -Voltaren gel four times daily for left knee pain -Ongoing psychosocial and emotional support -Appreciate chaplain consult and spiritual support  Code Status/Advance Care Planning:  DNR   Symptom Management:   As above  Palliative Prophylaxis:   Delirium Protocol and Frequent Pain Assessment  Additional Recommendations (Limitations, Scope, Preferences):  No Surgical Procedures  Psycho-social/Spiritual:   Desire for further Chaplaincy support:yes  Additional Recommendations: Caregiving  Support/Resources, Education on Hospice and Grief/Bereavement Support  Prognosis:   Poor long-term prognosis given functional and cognitive decline, poor candidacy for vascular interventions, advanced age and multiple comorbidities  Discharge Planning: Home with Hospice      Primary Diagnoses: Present on Admission: . Ischemic foot   I have reviewed the medical record, interviewed the patient and family, and examined the patient. The following aspects are pertinent.  Past Medical History:  Diagnosis Date  . Arthritis   . Cancer ([place])    rt lung  . COPD GOLD I 04/18/2013  . Glaucoma   . Hyperlipidemia   . Hypertension   . Lung cancer ([place]) 06/07/2013  . Peripheral vascular disease, unspecified ([place])    with Claudication  . S/P radiation therapy 12/12, 12/15, and 07/19/15   The LLL nodule was treated to 54 Gy in 3 fractions of 18 Gy  . Shortness of breath   . Stroke [facility])    Social History   Socioeconomic History  . Marital status: Widowed    Spouse name: Not on file  . Number of children: 1  . Years of education: Not on file  . Highest education level: Not on file  Occupational History  . Occupation: Retired  Tobacco  Use  . Smoking status: Former Smoker    Packs/day: 0.00    Years: 60.00    Pack years: 0.00    Types: Cigarettes    Quit date: 07/31/2009    Years since quitting: 11.2  . Smokeless tobacco: Never Used  Vaping Use  . Vaping Use: Never used  Substance and Sexual Activity  . Alcohol use: No  . Drug use: No  . Sexual activity: Never  Other Topics Concern  . Not on file  Social History Narrative   Pt lives alone. She is HOH, especially left ear.   Social Determinants of Health   Financial Resource Strain: Not on file  Food Insecurity: Not on file  Transportation Needs: Not on file  Physical Activity: Not on file  Stress: Not on file  Social Connections: Not on file   Family History  Problem Relation Age of Onset  . Stomach cancer Mother   . Stroke Brother   . Asthma Sister   . Colon cancer Brother    Scheduled Meds: . amLODipine  5 mg Oral Daily  . cholecalciferol  1,000 Units Oral Daily  . cycloSPORINE  2 drop Both Eyes BID  . diclofenac Sodium  2 g Topical QID  . gabapentin  200 mg Oral TID  . latanoprost  1 drop Both Eyes QHS  . losartan  50 mg Oral Daily  . mometasone-formoterol  2 puff Inhalation BID  . oxyCODONE  5 mg Oral Q4H  . pantoprazole  40 mg Oral Daily  . pravastatin  20 mg Oral QHS   Continuous Infusions: . heparin 800 Units/hr (11/12/20 1158)   PRN Meds:.acetaminophen **OR** acetaminophen, albuterol, albuterol, haloperidol lactate, meclizine, ondansetron **OR** ondansetron (ZOFRAN) IV, oxyCODONE, tiZANidine Medications Prior to Admission:  Prior to Admission medications   Medication Sig Start Date End Date Taking? Authorizing Provider  albuterol (PROVENTIL) (2.5 MG/3ML) 0.083% nebulizer solution Take 2.5 mg by nebulization every 6 (six) hours as needed for wheezing or shortness of breath. 10/15/19  Yes [provider]  amLODipine (NORVASC) 5 MG tablet Take 5 mg by mouth daily. 07/26/20  Yes [provider]  budesonide-formoterol  (SYMBICORT) 160-4.5 MCG/ACT inhaler Inhale 2 puffs into the lungs 2 (two) times daily. 11/03/17  Yes Quintella Reichert, MD  Cholecalciferol (VITAMIN D3) 1000 units CAPS Take 1,000 capsules by mouth daily.   Yes [provider]  clopidogrel (PLAVIX) 75 MG tablet Take 1 tablet (75 mg total) by mouth every Monday, Wednesday, and Friday. 10/01/17  Yes Clent Demark, PA-C  diclofenac sodium (VOLTAREN) 1 % GEL Apply 2 g topically 4 (four) times daily. 08/15/18  Yes [provider]  gabapentin (NEURONTIN) 100 MG  capsule Take 1 capsule (100 mg total) by mouth 3 (three) times daily. 09/19/17  Yes Clent Demark, PA-C  losartan (COZAAR) 50 MG tablet Take 50 mg by mouth daily. 05/15/17  Yes [provider]  LUMIGAN 0.01 % SOLN Place 1 drop into both eyes at bedtime. 02/03/20  Yes [provider]  meclizine (ANTIVERT) 25 MG tablet Take 25 mg by mouth every 8 (eight) hours as needed for dizziness (dizziness).    Yes [provider]  omeprazole (PRILOSEC) 40 MG capsule Take 40 mg by mouth daily.   Yes [provider]  oxyCODONE (ROXICODONE) 5 MG immediate release tablet Take 0.5-1 tablets (2.5-5 mg total) by mouth every 6 (six) hours as needed for severe pain. 10/18/20  Yes Harris, Abigail, PA-C  pravastatin (PRAVACHOL) 20 MG tablet Take 20 mg by mouth at bedtime. 05/04/17  Yes [provider]  PROAIR HFA 108 (90 Base) MCG/ACT inhaler Inhale 2 puffs into the lungs every 6 (six) hours as needed. 02/12/20  Yes [provider]  RESTASIS 0.05 % ophthalmic emulsion Place 2 drops into both eyes 2 (two) times daily.  03/30/16  Yes [provider]  tiZANidine (ZANAFLEX) 2 MG tablet Take 2 mg by mouth 2 (two) times daily as needed for muscle spasms. 11/12/17  Yes [provider]  TRAVATAN Z 0.004 % SOLN ophthalmic solution Place 1 drop into both eyes at bedtime.  03/29/16  Yes [provider]   Allergies  Allergen Reactions  .  Aspirin Anaphylaxis  . Morphine And Related Other (See Comments)    confusion   Review of Systems  Musculoskeletal: Positive for arthralgias.  Neurological: Positive for weakness.  All other systems reviewed and are negative.   Physical Exam Vitals and nursing note reviewed.  Constitutional:      General: She is not in acute distress. Cardiovascular:     Rate and Rhythm: Normal rate.  Pulmonary:     Effort: Pulmonary effort is normal.  Neurological:     Mental Status: She is alert.  Psychiatric:        Mood and Affect: Mood normal.     Vital Signs: BP 137/66 (BP Location: Left Arm)   Pulse 71   Temp 97.6 F (36.4 C) (Oral)   Resp 18   Ht 5' (1.524 m)   Wt 67 kg   SpO2 99%   BMI 28.85 kg/m  Pain Scale: 0-10 POSS *See Group Information*: S-Acceptable,Sleep, easy to arouse Pain Score: Asleep   SpO2: SpO2: 99 % O2 Device:SpO2: 99 % O2 Flow Rate: .   IO: Intake/output summary:   Intake/Output Summary (Last 24 hours) at 11/12/2020 1206 Last data filed at 11/12/2020 1158 Gross per 24 hour  Intake 267.37 ml  Output 1 ml  Net 266.37 ml    LBM: Last BM Date: 11/10/20 Baseline Weight: Weight: 67 kg Most recent weight: Weight: 67 kg     Palliative Assessment/Data: 40% at best     Time In: 11:15am Time Out: 12:30pm Time Total: 75 minutes   Greater than 50% of this time was spent counseling and coordinating care related to the above assessment and plan.  Signed by: Norberta Keens, PA-C Palliative Medicine Team Team phone # [phone]  Thank you for allowing the Palliative Medicine Team to assist in the care of this patient. Please utilize secure chat with additional questions, if there is no response within 30 minutes please call the above phone number.  Palliative Medicine Team providers are  available by phone from 7am to 7pm daily and can be reached through the team cell phone.  Should this patient require assistance outside of these hours, please  call the patient's attending physician.

## 2020-11-13 DIAGNOSIS — I998 Other disorder of circulatory system: Secondary | ICD-10-CM

## 2020-11-13 DIAGNOSIS — E440 Moderate protein-calorie malnutrition: Secondary | ICD-10-CM

## 2020-11-13 LAB — CBC
HCT: 35 % — ABNORMAL LOW (ref 36.0–46.0)
Hemoglobin: 11.5 g/dL — ABNORMAL LOW (ref 12.0–15.0)
MCH: 31.2 pg (ref 26.0–34.0)
MCHC: 32.9 g/dL (ref 30.0–36.0)
MCV: 94.9 fL (ref 80.0–100.0)
Platelets: 245 10*3/uL (ref 150–400)
RBC: 3.69 MIL/uL — ABNORMAL LOW (ref 3.87–5.11)
RDW: 14 % (ref 11.5–15.5)
WBC: 4.4 10*3/uL (ref 4.0–10.5)
nRBC: 0 % (ref 0.0–0.2)

## 2020-11-13 LAB — HEPARIN LEVEL (UNFRACTIONATED): Heparin Unfractionated: 0.54 IU/mL (ref 0.30–0.70)

## 2020-11-13 MED ORDER — OXYCODONE HCL 5 MG PO TABS
5.0000 mg | ORAL_TABLET | Freq: Four times a day (QID) | ORAL | Status: DC
  Administered 2020-11-13 – 2020-11-20 (×23): 5 mg via ORAL
  Filled 2020-11-13 (×23): qty 1

## 2020-11-13 MED ORDER — SENNOSIDES-DOCUSATE SODIUM 8.6-50 MG PO TABS
1.0000 | ORAL_TABLET | Freq: Two times a day (BID) | ORAL | Status: DC
  Administered 2020-11-13 – 2020-11-19 (×12): 1 via ORAL
  Filled 2020-11-13 (×13): qty 1

## 2020-11-13 MED ORDER — BIOTENE DRY MOUTH MT LIQD: 15.0000 mL | OROMUCOSAL | Status: DC | PRN

## 2020-11-13 MED ORDER — POLYETHYLENE GLYCOL 3350 17 G PO PACK
17.0000 g | PACK | Freq: Every day | ORAL | Status: DC | PRN
  Administered 2020-11-19: 17 g via ORAL
  Filled 2020-11-13: qty 1

## 2020-11-13 NOTE — Progress Notes (Signed)
Pt alert and aware in some pain and discomfort. [place] daughter was present at bedside pt was a little HOH.  The chaplain offered caring and supportive presence, prayers and blessings. Futher visits requested.

## 2020-11-13 NOTE — Plan of Care (Signed)
  Problem: Education: Goal: Knowledge of General Education information will improve Description Including pain rating scale, medication(s)/side effects and non-pharmacologic comfort measures Outcome: Progressing   

## 2020-11-13 NOTE — Progress Notes (Signed)
Daily Progress Note   Patient Name: [name]       Date: 11/13/2020 DOB: Jul 02, 1928  Age: 87 y.o. MRN#: [id number] Attending Physician: Dwyane Dee, MD Primary Care Physician: [name], MD Admit Date: 11/10/2020  Reason for Consultation/Follow-up: Establishing goals of care and Psychosocial/spiritual support  Subjective: Medical records reviewed. Assessed patient at the bedside and met with granddaughter [name] as a follow up to yesterday's goals of care conversation. Patient is resting, complaining of left leg pain upon waking. She is becoming more confused and her speech is difficult to understand.   [name] shares that she was able to find patient's prosthesis at home to comply with patient's interest in PT during this hospitalization. [name] has been providing patient with great care, bringing many personal items at patient's request. She wonders whether the patient already believes she is at a SNF due to some of her statements of dissatisfication. If this is the case, patient is easily consolable and [name] may wish to proceed with SNF and hospice support there. She is becoming increasingly concerned about her ability to arrange for care in the home. Many agencies will likely not get back in touch with her until Monday due to the Easter holiday. She has also heard from Freddie Breech of [facility] that home hospice evaluation would occur after discharge.  We discussed patient's pain management with scheduled Oxycodone. [name] is concerned that pain was controlled better when given every 4 hours rather than 6. She is also concerned that heparin was possibly decreased as patient is complaining of worsening pain. We reviewed heparin over the last several days and I reassured her there have been no  changes. We discussed allowing another day before adjusting Oxycodone further to determine whether patient develops tolerance to adverse effect of confusion/drowsiness.  Patient was on a bedpan for unknown length of time which may be contributing to pain. Assisted [name] with turning and cleaning the patient. Questions and concerns addressed. PMT will continue to support holistically.      Length of Stay: 1  Current Medications: Scheduled Meds:  . amLODipine  5 mg Oral Daily  . cholecalciferol  1,000 Units Oral Daily  . cycloSPORINE  2 drop Both Eyes BID  . diclofenac Sodium  2 g Topical QID  . feeding supplement  237 mL Oral BID  BM  . gabapentin  200 mg Oral TID  . latanoprost  1 drop Both Eyes QHS  . losartan  50 mg Oral Daily  . mometasone-formoterol  2 puff Inhalation BID  . oxyCODONE  5 mg Oral Q6H  . pantoprazole  40 mg Oral Daily  . pravastatin  20 mg Oral QHS  . senna-docusate  1 tablet Oral BID    Continuous Infusions: . heparin 800 Units/hr (11/13/20 0043)    PRN Meds: acetaminophen **OR** acetaminophen, albuterol, albuterol, antiseptic oral rinse, haloperidol lactate, meclizine, ondansetron **OR** ondansetron (ZOFRAN) IV, oxyCODONE, polyethylene glycol, tiZANidine  Physical Exam Vitals and nursing note reviewed.  Constitutional:      General: She is sleeping.  Cardiovascular:     Rate and Rhythm: Normal rate.  Pulmonary:     Effort: Pulmonary effort is normal.  Musculoskeletal:     Right Lower Extremity: Right leg is amputated above knee.  Neurological:     Mental Status: She is easily aroused. Mental status is at baseline.             Vital Signs: BP (!) 119/51 (BP Location: Left Arm)   Pulse 66   Temp 98.7 F (37.1 C) (Oral)   Resp (!) 21   Ht 5' (1.524 m)   Wt 67 kg   SpO2 95%   BMI 28.85 kg/m  SpO2: SpO2: 95 % O2 Device: O2 Device: Room Air O2 Flow Rate:    Intake/output summary: No intake or output data in the 24 hours ending 11/13/20  1629 LBM: Last BM Date: 11/10/20 Baseline Weight: Weight: 67 kg Most recent weight: Weight: 67 kg       Palliative Assessment/Data: 30%     Patient Active Problem List   Diagnosis Date Noted  . PVD (peripheral vascular disease) ([place]) 11/12/2020  . Malnutrition of moderate degree 11/12/2020  . Goals of care, counseling/discussion   . Palliative care by specialist   . Altered mental status   . Ischemic foot 11/10/2020  . Cough 02/15/2016  . Bilateral sensorineural hearing loss 12/22/2015  . Sepsis ([place]) 10/26/2015  . Vertigo 08/03/2015  . Full code status 05/10/2015  . Numbness-Left Heel and Toes  10/28/2014  . Sharp pain- Left Lateral Thigh and Right Stump 10/28/2014  . Cancer of lower lobe of left lung ([place]) 06/07/2013  . COPD GOLD I 04/18/2013  . Hemoptysis 04/10/2013  . Pain in joint, lower leg 03/19/2013  . Iliac artery stenosis, left ([place]) 03/13/2012  . Peripheral vascular disease, unspecified ([place]) 02/28/2012  . Chest pain 10/04/2011  . Hypertension 10/04/2011  . Hyperlipidemia 10/04/2011    Palliative Care Assessment & Plan   Patient Profile: 87 y.o. female  with past medical history of HTN, PVD, PAOD, HLD, COPD, right AKA, lung cancer s/p radiation therapy admitted on 11/10/2020 with left leg pain.   She was found to have occlusions of the left common iliac and SFA. EDP spoke with Vascular surgery (Dr. Carlis Abbott). The patient was started on heparin gtt w/ rec to be admitted to [facility]. Patient is a poor candidate for revascularization. Palliative care has been consulted to assist with determining goals of care.   Assessment: Severe PVD Dementia LLE pain  Recommendations/Plan: Consult PT for evaluation using prosthetic at family's request Continue current interventions for stabilization  Ongoing support and goals of care discussion; granddaughter is still leaning towards home hospice but considering SNF with hospice as well Psychosocial and emotional support  provided Consult spiritual care, appreciate spiritual support  Goals of Care and Additional Recommendations: Limitations on Scope of Treatment: No Surgical Procedures  Code Status: DNR/DNI   Code Status Orders  (From admission, onward)         Start     Ordered   11/12/20 1139  Do not attempt resuscitation (DNR)  Continuous       Question Answer Comment  In the event of cardiac or respiratory ARREST Do not call a "code blue"   In the event of cardiac or respiratory ARREST Do not perform Intubation, CPR, defibrillation or ACLS   In the event of cardiac or respiratory ARREST Use medication by any route, position, wound care, and other measures to relive pain and suffering. May use oxygen, suction and manual treatment of airway obstruction as needed for comfort.      11/12/20 1139        Code Status History    Date Active Date Inactive Code Status Order ID Comments User Context   11/10/2020 1810 11/12/2020 1139 Full Code [id number]  Jonnie Finner, DO Inpatient   06/16/2013 1508 06/25/2013 1408 Full Code [id number]  Sara Chu Inpatient   04/10/2013 1116 04/11/2013 1836 Full Code [id number]  Erick Colace, NP Inpatient   Advance Care Planning Activity      Prognosis:  Poor long-term prognosis given functional and cognitive decline, poor candidacy for vascular interventions, advanced age and multiple comorbidities  Discharge Planning: Home with Hospice  Care plan was discussed with patient's granddaughter/HCPOA [name]  Total time: 31 minutes  Greater than 50% of this time was spent counseling and coordinating care related to the above assessment and plan.  Larnie Heart Johnnette Litter, PA-C Palliative Medicine Team Team phone # [phone]  Thank you for allowing the Palliative Medicine Team to assist in the care of this patient. Please utilize secure chat with additional questions, if there is no response within 30 minutes please call the above phone number.  Palliative  Medicine Team providers are available by phone from 7am to 7pm daily and can be reached through the team cell phone.  Should this patient require assistance outside of these hours, please call the patient's attending physician.

## 2020-11-13 NOTE — Progress Notes (Signed)
Progress Note    [name]   POE:[id number]  DOB: 07-Jun-1928  DOA: 11/10/2020     1  PCP: [name], MD  CC: left leg pain   Hospital Course: [name] is a 87 y.o. female with medical history significant of HTN, PVD, PAOD, HLD. Presenting with left leg pain. Hx from granddtr at bedside. Her granddtr reports that the patient has been complaining of pain more frequently at home and having more difficulty with ambulation.  Over the past several weeks to months, her granddaughter has noticed an overall gradual decline in the patient's ability to thrive in general.  She also notes that the patient has been becoming more confused/forgetful at times (example provided was she is now forgetting to pay bills which was never an issue in the past).  She also has been more dependent recently on pain medications due to the ongoing pain, spasms, cramping in her left leg.  She is also not eating as much as previously due to pain associated with eating as well at times.  She was admitted for vascular surgery evaluation. After evaluation, her occlusive disease was considered rather remarkable and intervention would be high risk with the patient's underlying comorbidities, frailty, and overall debility.  Palliative care was recommended.   Interval History:  No events overnight.  Granddaughter present bedside this morning.  States that the scheduled pain regimen started yesterday has been working better but may be too frequent with every 4 hours; was asking for to be changed to every 6 hours.  She is working on getting the home ready for patient to return with hospice.  ROS: Constitutional: negative for chills and fevers, Respiratory: negative for cough, Cardiovascular: negative for chest pain and Gastrointestinal: positive for abdominal pain  Assessment & Plan:  Severe PVD - patient has occluded celiac trunk with retrograde flow from SMA and PDA branches; calcified plaque at aortic bifurcation and  origin of right CIA; right EIA heavily calcified and small; junction of right EIA and proximal CFA has high-grade stenosis w/ short segment occlusion; calcified plaque in right CFA; no significant flow in right SFA (and R AKA noted); Left CIA is stented and occluded; left EIA has diffuse atherosclerotic disease; left CFA is heavily calcified but patent; left SFA occluded at origin (reconstitution of flow in popliteal artery above-knee); no significant flow in PTA; peroneal artery patent to ankle; areas of occlusion involving anterior tibial artery noted; no significant flow in dorsalis pedis artery -Has been evaluated by vascular surgery.  Intervention would require overly aggressive measures (starting in right brachial artery possibly) - vascular surgery and myself have dicussed that palliative care is more appropriate at this time and intervention may be more harm than good at this point; patient is also starting to exhibit some signs of dementia per her granddaughter and she has been also not eating as much probably in part due to her mesenteric ischemia as well -Granddaughter and patient have decided on bringing her home with hospice -Continue heparin drip for now while in hospital given patient associating pain relief with this -Continue oxycodone and modify as needed -Palliative care now following and has had family discussion on 11/12/2020.  Greatly appreciate assistance.  Plan as noted will be for home with hospice  HTN -Continue amlodipine, losartan  HLD -On pravastatin currently.  Does have severe PAD however mortality benefit from statin questionable at this point; if going to pursue less aggressive measures, will probably discontinue statin at that time  Hx NSCLC  - stable; continue inhalers   Elevated troponin  - flat trend; low suspicion for ACS at this nor would be a good candidate - continue heparin drip for now   Old records reviewed in assessment of this  patient  Antimicrobials: n/a  DVT prophylaxis: Heparin drip    Code Status:   Code Status: DNR Family Communication: granddaughter   Disposition Plan: Status is: Inpatient  Remains inpatient appropriate because:IV treatments appropriate due to intensity of illness or inability to take PO and Inpatient level of care appropriate due to severity of illness   Dispo: The patient is from: Home              Anticipated d/c is to: Home              Patient currently is not medically stable to d/c.   Difficult to place patient No  Risk of unplanned readmission score: Unplanned Admission- Pilot do not use: 24.38   Objective: Blood pressure 108/83, pulse 70, temperature (!) 97.3 F (36.3 C), temperature source Axillary, resp. rate 20, height 5' (1.524 m), weight 67 kg, SpO2 95 %.  Examination: General appearance: Chronically ill-appearing elderly woman laying in bed appearing uncomfortable but no acute distress Head: Normocephalic, without obvious abnormality, atraumatic Eyes: EOMI Lungs: clear to auscultation bilaterally Heart: regular rate and rhythm and S1, S2 normal Abdomen: Thin, nonspecific tenderness to palpation, bowel sounds present Extremities: Cool left foot to the touch with darkening of skin on toes.  Unable to palpate DP/PT pulses.  No edema noted.  No ulcers noted.  Right AKA noted.  Left leg tender to palpation throughout Skin: mobility and turgor normal Neurologic: follows commands, moves all 4 extremities   Consultants:   Vascular surgery  PCM  Procedures:   n/a  Data Reviewed: I have personally reviewed following labs and imaging studies Results for orders placed or performed during the hospital encounter of 11/10/20 (from the past 24 hour(s))  Heparin level (unfractionated)     Status: None   Collection Time: 11/13/20 12:57 AM  Result Value Ref Range   Heparin Unfractionated 0.54 0.30 - 0.70 IU/mL  CBC     Status: Abnormal   Collection Time: 11/13/20  12:57 AM  Result Value Ref Range   WBC 4.4 4.0 - 10.5 K/uL   RBC 3.69 (L) 3.87 - 5.11 MIL/uL   Hemoglobin 11.5 (L) 12.0 - 15.0 g/dL   HCT 35.0 (L) 36.0 - 46.0 %   MCV 94.9 80.0 - 100.0 fL   MCH 31.2 26.0 - 34.0 pg   MCHC 32.9 30.0 - 36.0 g/dL   RDW 14.0 11.5 - 15.5 %   Platelets 245 150 - 400 K/uL   nRBC 0.0 0.0 - 0.2 %    Recent Results (from the past 240 hour(s))  Culture, blood (routine x 2)     Status: None (Preliminary result)   Collection Time: 11/10/20  9:55 AM   Specimen: BLOOD  Result Value Ref Range Status   Specimen Description   Final    BLOOD LEFT ANTECUBITAL Performed at [facility], [place] [address] [place], [place] [address]    Special Requests   Final    BOTTLES DRAWN AEROBIC AND ANAEROBIC Blood Culture results may not be optimal due to an inadequate volume of blood received in culture bottles Performed at [place] [address] [place], [place] [address]    Culture   Final    NO GROWTH  3 DAYS Performed at [facility], [place] [address], [place], [place] [address]    Report Status PENDING  Incomplete  Culture, blood (routine x 2)     Status: None (Preliminary result)   Collection Time: 11/10/20 10:27 AM   Specimen: BLOOD  Result Value Ref Range Status   Specimen Description   Final    BLOOD BLOOD RIGHT FOREARM Performed at [place] [address], [place], [place] [address]    Special Requests   Final    BOTTLES DRAWN AEROBIC AND ANAEROBIC Blood Culture adequate volume Performed at [place] [address] [place], [place] [address]    Culture   Final    NO GROWTH 3 DAYS Performed at [facility], [place] [address] [place], [place] [address]    Report Status PENDING  Incomplete  Resp Panel by RT-PCR (Flu A&B, Covid) Nasopharyngeal Swab     Status: None   Collection Time: 11/10/20  1:33 PM   Specimen: Nasopharyngeal Swab; Nasopharyngeal(NP) swabs in  vial transport medium  Result Value Ref Range Status   SARS Coronavirus 2 by RT PCR NEGATIVE NEGATIVE Final    Comment: (NOTE) SARS-CoV-2 target nucleic acids are NOT DETECTED.  The SARS-CoV-2 RNA is generally detectable in upper respiratory specimens during the acute phase of infection. The lowest concentration of SARS-CoV-2 viral copies this assay can detect is 138 copies/mL. A negative result does not preclude SARS-Cov-2 infection and should not be used as the sole basis for treatment or other patient management decisions. A negative result may occur with  improper specimen collection/handling, submission of specimen other than nasopharyngeal swab, presence of viral mutation(s) within the areas targeted by this assay, and inadequate number of viral copies(<138 copies/mL). A negative result must be combined with clinical observations, patient history, and epidemiological information. The expected result is Negative.  Fact Sheet for Patients:  EntrepreneurPulse.com.au  Fact Sheet for Healthcare Providers:  IncredibleEmployment.be  This test is no t yet approved or cleared by the [place] FDA and  has been authorized for detection and/or diagnosis of SARS-CoV-2 by FDA under an Emergency Use Authorization (EUA). This EUA will remain  in effect (meaning this test can be used) for the duration of the COVID-19 declaration under Section 564(b)(1) of the Act, 21 U.S.C.section 360bbb-3(b)(1), unless the authorization is terminated  or revoked sooner.       Influenza A by PCR NEGATIVE NEGATIVE Final   Influenza B by PCR NEGATIVE NEGATIVE Final    Comment: (NOTE) The Xpert Xpress SARS-CoV-2/FLU/RSV plus assay is intended as an aid in the diagnosis of influenza from Nasopharyngeal swab specimens and should not be used as a sole basis for treatment. Nasal washings and aspirates are unacceptable for Xpert Xpress SARS-CoV-2/FLU/RSV testing.  Fact  Sheet for Patients: EntrepreneurPulse.com.au  Fact Sheet for Healthcare Providers: IncredibleEmployment.be  This test is not yet approved or cleared by the [place] FDA and has been authorized for detection and/or diagnosis of SARS-CoV-2 by FDA under an Emergency Use Authorization (EUA). This EUA will remain in effect (meaning this test can be used) for the duration of the COVID-19 declaration under Section 564(b)(1) of the Act, 21 U.S.C. section 360bbb-3(b)(1), unless the authorization is terminated or revoked.  Performed at [facility], [place] [address], [place], [place] [address]      Radiology Studies: No results found. CT Angio Aortobifemoral W and/or Wo Contrast  Final Result    DG Chest  Port 1 View  Final Result      Scheduled Meds: . amLODipine  5 mg Oral Daily  . cholecalciferol  1,000 Units Oral Daily  . cycloSPORINE  2 drop Both Eyes BID  . diclofenac Sodium  2 g Topical QID  . feeding supplement  237 mL Oral BID BM  . gabapentin  200 mg Oral TID  . latanoprost  1 drop Both Eyes QHS  . losartan  50 mg Oral Daily  . mometasone-formoterol  2 puff Inhalation BID  . oxyCODONE  5 mg Oral Q6H  . pantoprazole  40 mg Oral Daily  . pravastatin  20 mg Oral QHS  . senna-docusate  1 tablet Oral BID   PRN Meds: acetaminophen **OR** acetaminophen, albuterol, albuterol, antiseptic oral rinse, haloperidol lactate, meclizine, ondansetron **OR** ondansetron (ZOFRAN) IV, oxyCODONE, polyethylene glycol, tiZANidine Continuous Infusions: . heparin 800 Units/hr (11/13/20 0043)     LOS: 1 day  Time spent: Greater than 50% of the 35 minute visit was spent in counseling/coordination of care for the patient as laid out in the A&P.   Dwyane Dee, MD [facility] 11/13/2020, 12:58 PM

## 2020-11-13 NOTE — Progress Notes (Signed)
[facility] for Heparin Indication: PVD, possible ischemic foot  Allergies  Allergen Reactions  . Aspirin Anaphylaxis  . Morphine And Related Other (See Comments)    confusion    Patient Measurements: Height: 5' (152.4 cm) Weight: 67 kg (147 lb 11.3 oz) IBW/kg (Calculated) : 45.5 Heparin Dosing Weight: 59.9 kg  Vital Signs: Temp: 97.6 F (36.4 C) (04/16 0408) Temp Source: Oral (04/16 0408) BP: 103/51 (04/16 0408) Pulse Rate: 60 (04/16 0408)  Labs: Recent Labs    11/10/20 1027 11/10/20 1241 11/10/20 2314 11/11/20 0538 11/12/20 0228 11/13/20 0057  HGB 15.2*  --   --  11.9* 11.7* 11.5*  HCT 45.5  --   --  35.4* 35.7* 35.0*  PLT 337  --   --  268 257 245  APTT 34  --   --   --   --   --   LABPROT 13.6  --   --   --   --   --   INR 1.0  --   --   --   --   --   HEPARINUNFRC  --   --    < > 0.54 0.36 0.54  CREATININE 0.97  --   --  0.94  --   --   TROPONINIHS 38* 36*  --   --   --   --    < > = values in this interval not displayed.    Estimated Creatinine Clearance: 34 mL/min (by C-G formula based on SCr of 0.94 mg/dL).   Assessment: 87 yo female with hx of PVD s/p amputation of RLE and L common iliac stenting admitted with L leg pain.  CT shows occlusions of the left common iliac and SFA.  Pharmacy is consulted to dose IV heparin.  Heparin level therapeutic (0.54) on gtt at 800 units/hr. CBC stable. No bleeding noted.   Goal of Therapy:  Heparin level 0.3-0.7 units/ml Monitor platelets by anticoagulation protocol: Yes   Plan:  Continue heparin 800 units/hr  Monitor daily heparin level, CBC  Romilda Garret, PharmD PGY1 Acute Care Pharmacy Resident 11/13/2020 7:27 AM  Please check [email or web address] for unit specific pharmacy phone numbers.

## 2020-11-14 DIAGNOSIS — I7090 Unspecified atherosclerosis: Secondary | ICD-10-CM

## 2020-11-14 DIAGNOSIS — I10 Essential (primary) hypertension: Secondary | ICD-10-CM

## 2020-11-14 LAB — CBC
HCT: 35.7 % — ABNORMAL LOW (ref 36.0–46.0)
Hemoglobin: 11.7 g/dL — ABNORMAL LOW (ref 12.0–15.0)
MCH: 31.2 pg (ref 26.0–34.0)
MCHC: 32.8 g/dL (ref 30.0–36.0)
MCV: 95.2 fL (ref 80.0–100.0)
Platelets: 243 10*3/uL (ref 150–400)
RBC: 3.75 MIL/uL — ABNORMAL LOW (ref 3.87–5.11)
RDW: 14.1 % (ref 11.5–15.5)
WBC: 5.5 10*3/uL (ref 4.0–10.5)
nRBC: 0 % (ref 0.0–0.2)

## 2020-11-14 LAB — HEPARIN LEVEL (UNFRACTIONATED): Heparin Unfractionated: 0.46 IU/mL (ref 0.30–0.70)

## 2020-11-14 MED ORDER — BENZOCAINE 10 % MT GEL
Freq: Four times a day (QID) | OROMUCOSAL | Status: DC | PRN
  Filled 2020-11-14: qty 9

## 2020-11-14 NOTE — Progress Notes (Signed)
Mobility Specialist: Progress Note   11/14/20 1526  Mobility  Activity Transferred:  Chair to bed  Level of Assistance +2 (takes two people)  Mobility Response Tolerated poorly  Mobility performed by Mobility specialist  $Mobility charge 1 Mobility   Assisted RN and NT in transferring pt to bed. Pt was maxA to transfer.   [facility] Mobility Specialist Mobility Specialist Phone: [phone]

## 2020-11-14 NOTE — Progress Notes (Addendum)
Palliative Medicine Progress Note  Medical records reviewed. Assessed patient at the bedside and met with granddaughter [name]. Physical Therapy also at the bedside, ready to work with patient using prosthesis as requested by family.   [name] reports that patient has been more alert and clearer in her speech today, although still tangential and difficult to follow. She understands the gradual process of optimizing pain control with narcotics while limiting excessive sedation or confusion. She has been reflecting on this a lot and wants to make sure patient does not suffer.  We discussed [name]'s exhaustion and feelings of guilt that she may need to break her promise to the patient. [name] is tearful and states she will continue trying everything she can to bring the patient home, but she is also processing the potential need for increased care at [facility]. She is now leaning towards SNF/long-term care with hospice and would like to take some time to care for herself and continue processing. Provided encouragement and provided reassurance that I will honor this reasonable request.  Questions and concerns addressed. PMT will continue to support holistically.  Assessment: Severe PVD Dementia/altered mental status LLE pain  Recommendations/Plan:  Psychosocial and emotional support provided  No medication adjustments today  Granddaughter is now leaning towards d/c to facility with hospice, agrees to meet again on Tuesday 4/19 to discuss goals of care further   Total time: 15 minutes  Greater than 50% of this time was spent in counseling and coordinating care related to the above assessment and plan.  Dorthy Cooler, PA-C Palliative Medicine Team Team phone # [phone]  Thank you for allowing the Palliative Medicine Team to assist in the care of this patient. Please utilize secure chat with additional questions, if there is no response within 30 minutes please call the above phone  number.  Palliative Medicine Team providers are available by phone from 7am to 7pm daily and can be reached through the team cell phone.  Should this patient require assistance outside of these hours, please call the patient's attending physician.

## 2020-11-14 NOTE — Evaluation (Signed)
Physical Therapy Evaluation Patient Details Name: [name] MRN: [id number] DOB: 01-07-1928 Today's Date: 11/14/2020   History of Present Illness  87 y.o. female who presents on 11/10/20 with LLE pain; found to have occlusions of left common iliac and SFA. Per notes, not appropriate for surgical intervention. PMH includes HTN, PVD, PAD.  Clinical Impression  Patient presents with pain, generalized weakness, debility, confusion, impaired balance and impaired mobility s/p above. Pt has been requiring more assist at home per granddaughter and has some caregivers that come in to assist with bed level ADls. Re-consulted by palliative care per family request to assist pt OOB today using her prosthesis however pt continues to require Max A of 2 to stand and SPT to chair and total A to donn prosthesis. Recommend transfer to chair via maximove with nursing if pt wanting to get OOB. Per notes, looks like granddaughter is leaning more towards SNF with hospice services. Pt not appropriate for acute PT at this time due to above. Will sign off.     Follow Up Recommendations Supervision/Assistance - 24 hour;Supervision for mobility/OOB;SNF (with hospice)    Equipment Recommendations  Other (comment);Hospital bed (hoyer lift, if going home)    Recommendations for Other Services       Precautions / Restrictions Precautions Precautions: Fall;Other (comment) Precaution Comments: Rt AKA- prosthesis in room      Mobility  Bed Mobility Overal bed mobility: Needs Assistance Bed Mobility: Rolling;Sidelying to Sit Rolling: Min assist Sidelying to sit: Mod assist;HOB elevated       General bed mobility comments: Min A to roll to left/right to place bed pan with max cues. Heavy assist to elevate trunk to get to EOB. Posterior bias and lean.    Transfers Overall transfer level: Needs assistance Equipment used: 2 person hand held assist Transfers: Sit to/from Stand Sit to Stand: Max assist;+2 physical  assistance   Squat pivot transfers: Max assist;+2 physical assistance     General transfer comment: Max A +2 to power to standing x2 from EOB; MAx A of 2 SPT to chair towards left with inability to bear much weight, if any through LLE.  Ambulation/Gait             General Gait Details: Unable  Stairs            Wheelchair Mobility    Modified Rankin (Stroke Patients Only)       Balance Overall balance assessment: Needs assistance Sitting-balance support: Feet supported;No upper extremity supported Sitting balance-Leahy Scale: Poor Sitting balance - Comments: Requires Mod A for static sitting balance with posterior lean. Postural control: Posterior lean Standing balance support: During functional activity Standing balance-Leahy Scale: Zero Standing balance comment: Max A of 2 to stand.n                             Pertinent Vitals/Pain Pain Assessment: Faces Faces Pain Scale: Hurts whole lot Pain Location: LLE with movement Pain Descriptors / Indicators: Aching;Grimacing;Guarding Pain Intervention(s): Monitored during session;Repositioned;Limited activity within patient's tolerance    Home Living Family/patient expects to be discharged to:: Private residence Living Arrangements: Children;Other relatives Available Help at Discharge: Family;Available 24 hours/day Type of Home: Apartment Home Access: Level entry     Home Layout: One level Home Equipment: Walker - 2 wheels;Wheelchair - manual;Hospital bed;Bedside commode Additional Comments: Granddaughter states the patient does not access the shower or bathroom.  She generally stays in the bed flat or sitting  EOB, or transfers to the w/c.  Patient is not really wearing her prosthesis anymore.    Prior Function Level of Independence: Needs assistance   Gait / Transfers Assistance Needed: Increasing assist needed.  Mod to Max A for basic transfers when pt agreeable to get to [facility]. Otherwise stays in  the bed.  ADL's / Homemaking Assistance Needed: Increasing assist.  Family has paid caregivers for a few hours a week to assist with bed level ADL.        Hand Dominance   Dominant Hand: Right    Extremity/Trunk Assessment   Upper Extremity Assessment Upper Extremity Assessment: Defer to OT evaluation    Lower Extremity Assessment Lower Extremity Assessment: RLE deficits/detail;LLE deficits/detail RLE Deficits / Details: Rt AKA at baseline LLE Deficits / Details: Increased pain throughout LLE limiting assessment/MMT    Cervical / Trunk Assessment Cervical / Trunk Assessment: Kyphotic  Communication   Communication: HOH (hears better in left ear)  Cognition Arousal/Alertness: Awake/alert Behavior During Therapy: WFL for tasks assessed/performed Overall Cognitive Status: History of cognitive impairments - at baseline                                 General Comments: Dementia at baseline; follows commands with repetition.      General Comments General comments (skin integrity, edema, etc.): Granddaughter present during session.    Exercises     Assessment/Plan    PT Assessment Patent does not need any further PT services  PT Problem List Decreased strength;Decreased mobility;Pain;Decreased balance;Decreased activity tolerance;Decreased cognition;Decreased range of motion       PT Treatment Interventions Patient/family education;Therapeutic activities;Functional mobility training;Wheelchair mobility training;Therapeutic exercise    PT Goals (Current goals can be found in the Care Plan section)  Acute Rehab PT Goals Patient Stated Goal: Family not wanting SNF with hospice likely per notes on 11/14/20 PT Goal Formulation: With family Time For Goal Achievement: 11/28/20 Potential to Achieve Goals: Fair    Frequency Min 2X/week   Barriers to discharge Decreased caregiver support      Co-evaluation               AM-PAC PT "6 Clicks" Mobility   Outcome Measure Help needed turning from your back to your side while in a flat bed without using bedrails?: A Lot Help needed moving from lying on your back to sitting on the side of a flat bed without using bedrails?: A Lot Help needed moving to and from a bed to a chair (including a wheelchair)?: Total Help needed standing up from a chair using your arms (e.g., wheelchair or bedside chair)?: Total Help needed to walk in hospital room?: Total Help needed climbing 3-5 steps with a railing? : Total 6 Click Score: 8    End of Session Equipment Utilized During Treatment: Gait belt Activity Tolerance: Patient limited by pain Patient left: in chair;with call bell/phone within reach;with chair alarm set;with family/visitor present Nurse Communication: Mobility status;Need for lift equipment (use of stedy to return to bed) PT Visit Diagnosis: Pain;Other abnormalities of gait and mobility (R26.89);Muscle weakness (generalized) (M62.81) Pain - Right/Left: Left Pain - part of body: Leg    Time: 1152-1222 PT Time Calculation (min) (ACUTE ONLY): 30 min   Charges:   PT Evaluation $PT Eval Moderate Complexity: 1 Mod PT Treatments $Therapeutic Activity: 8-22 mins        Marisa Severin, PT, DPT Acute Rehabilitation Services Pager [phone] Office  [place] 11/14/2020, 1:28 PM

## 2020-11-14 NOTE — Progress Notes (Incomplete)
PT Cancellation Note  Patient Details Name: [name] MRN: [id number] DOB: [birth year]   Cancelled Treatment:    Reason Eval/Treat Not Completed: PT screened, no needs identified, will sign off PT evaluation and discharge was performed on 4/14 and at this time, pt was close to functional baseline needing Max A for OOB mobility. DME recommendations have been made as goal continues to be home with hospice. Spoke with Dr. Sabino Gasser, unsure about new consult. Will clarify DME needs with case manager.    [name] 11/14/2020, 7:35 AM

## 2020-11-14 NOTE — Progress Notes (Signed)
Progress Note    [name]   IWP:[id number]  DOB: Aug 11, 1927  DOA: 11/10/2020     2  PCP: [name], MD  CC: left leg pain   Hospital Course: [name] is a 87 y.o. female with medical history significant of HTN, PVD, PAOD, HLD. Presenting with left leg pain. Hx from granddtr at bedside. Her granddtr reports that the patient has been complaining of pain more frequently at home and having more difficulty with ambulation.  Over the past several weeks to months, her granddaughter has noticed an overall gradual decline in the patient's ability to thrive in general.  She also notes that the patient has been becoming more confused/forgetful at times (example provided was she is now forgetting to pay bills which was never an issue in the past).  She also has been more dependent recently on pain medications due to the ongoing pain, spasms, cramping in her left leg.  She is also not eating as much as previously due to pain associated with eating as well at times.  She was admitted for vascular surgery evaluation. After evaluation, her occlusive disease was considered rather remarkable and intervention would be high risk with the patient's underlying comorbidities, frailty, and overall debility.  Palliative care was recommended.   Interval History:  No events overnight.  Seems to be more comfortable from a pain standpoint the past couple days.  Granddaughter bedside this morning.  No changes to medication regimen needed today. Still having some soreness around her gumline where her dentures are; Biotene was ordered yesterday, will order further treatment today. They are still hoping to bring her home with hospice but some consideration still being given to SNF but the hope is still home.  ROS: Constitutional: negative for chills and fevers, Respiratory: negative for cough, Cardiovascular: negative for chest pain and Gastrointestinal: positive for abdominal pain  Assessment & Plan:  Severe  PVD - patient has occluded celiac trunk with retrograde flow from SMA and PDA branches; calcified plaque at aortic bifurcation and origin of right CIA; right EIA heavily calcified and small; junction of right EIA and proximal CFA has high-grade stenosis w/ short segment occlusion; calcified plaque in right CFA; no significant flow in right SFA (and R AKA noted); Left CIA is stented and occluded; left EIA has diffuse atherosclerotic disease; left CFA is heavily calcified but patent; left SFA occluded at origin (reconstitution of flow in popliteal artery above-knee); no significant flow in PTA; peroneal artery patent to ankle; areas of occlusion involving anterior tibial artery noted; no significant flow in dorsalis pedis artery -Has been evaluated by vascular surgery.  Intervention would require overly aggressive measures (starting in right brachial artery possibly) - vascular surgery and myself have dicussed that palliative care is more appropriate at this time and intervention may be more harm than good at this point; patient is also starting to exhibit some signs of dementia per her granddaughter and she has been also not eating as much probably in part due to her mesenteric ischemia as well -Granddaughter and patient have decided on bringing her home with hospice -Continue heparin drip for now while in hospital given patient associating pain relief with this -Continue oxycodone and modify as needed -Palliative care now following and has had family discussion on 11/12/2020.  Greatly appreciate assistance.  Plan as noted will be for home with hospice  HTN -on amlodipine, losartan at home.  On 11/14/2020, blood pressure soft this morning, therefore will hold antihypertensives and reassess  for further need  HLD -On pravastatin currently.  Does have severe PAD however mortality benefit from statin questionable at this point; if going to pursue less aggressive measures, will probably discontinue statin at  that time  Hx NSCLC  - stable; continue inhalers   Elevated troponin  - flat trend; low suspicion for ACS at this nor would be a good candidate - continue heparin drip for now   Old records reviewed in assessment of this patient  Antimicrobials: n/a  DVT prophylaxis: Heparin drip    Code Status:   Code Status: DNR Family Communication: granddaughter   Disposition Plan: Status is: Inpatient  Remains inpatient appropriate because:IV treatments appropriate due to intensity of illness or inability to take PO and Inpatient level of care appropriate due to severity of illness   Dispo: The patient is from: Home              Anticipated d/c is to: Home              Patient currently is not medically stable to d/c.   Difficult to place patient No  Risk of unplanned readmission score: Unplanned Admission- Pilot do not use: 21.27   Objective: Blood pressure 132/81, pulse 67, temperature 97.7 F (36.5 C), temperature source Oral, resp. rate 20, height 5' (1.524 m), weight 67 kg, SpO2 95 %.  Examination: General appearance: Chronically ill-appearing elderly woman laying in bed appearing uncomfortable but no acute distress Head: Normocephalic, without obvious abnormality, atraumatic Eyes: EOMI Lungs: clear to auscultation bilaterally Heart: regular rate and rhythm and S1, S2 normal Abdomen: Thin, nonspecific tenderness to palpation, bowel sounds present Extremities: Cool left foot to the touch with darkening of skin on toes along with further hardening of distal tip of digits consistent with ischemia.  Unable to palpate DP/PT pulses.  No edema noted.  No ulcers noted.  Right AKA noted.  Left leg tender to palpation throughout Skin: mobility and turgor normal Neurologic: follows commands, moves all 4 extremities   Consultants:   Vascular surgery  PCM  Procedures:   n/a  Data Reviewed: I have personally reviewed following labs and imaging studies Results for orders placed or  performed during the hospital encounter of 11/10/20 (from the past 24 hour(s))  Heparin level (unfractionated)     Status: None   Collection Time: 11/14/20  1:25 AM  Result Value Ref Range   Heparin Unfractionated 0.46 0.30 - 0.70 IU/mL  CBC     Status: Abnormal   Collection Time: 11/14/20  1:25 AM  Result Value Ref Range   WBC 5.5 4.0 - 10.5 K/uL   RBC 3.75 (L) 3.87 - 5.11 MIL/uL   Hemoglobin 11.7 (L) 12.0 - 15.0 g/dL   HCT 35.7 (L) 36.0 - 46.0 %   MCV 95.2 80.0 - 100.0 fL   MCH 31.2 26.0 - 34.0 pg   MCHC 32.8 30.0 - 36.0 g/dL   RDW 14.1 11.5 - 15.5 %   Platelets 243 150 - 400 K/uL   nRBC 0.0 0.0 - 0.2 %    Recent Results (from the past 240 hour(s))  Culture, blood (routine x 2)     Status: None (Preliminary result)   Collection Time: 11/10/20  9:55 AM   Specimen: BLOOD  Result Value Ref Range Status   Specimen Description   Final    BLOOD LEFT ANTECUBITAL Performed at [facility], [place] [address], [place], [place] [address]    Special Requests   Final  BOTTLES DRAWN AEROBIC AND ANAEROBIC Blood Culture results may not be optimal due to an inadequate volume of blood received in culture bottles Performed at [facility], [place] [address], [place], [place] [address]    Culture   Final    NO GROWTH 3 DAYS Performed at [facility], [place] [address] [place], [place] [address]    Report Status PENDING  Incomplete  Culture, blood (routine x 2)     Status: None (Preliminary result)   Collection Time: 11/10/20 10:27 AM   Specimen: BLOOD  Result Value Ref Range Status   Specimen Description   Final    BLOOD BLOOD RIGHT FOREARM Performed at [place] [address], [place], [place] [address]    Special Requests   Final    BOTTLES DRAWN AEROBIC AND ANAEROBIC Blood Culture adequate volume Performed at [place] [address] [place], [place] [address]    Culture   Final    NO GROWTH 3  DAYS Performed at [facility], [place] [address] [place], [place] [address]    Report Status PENDING  Incomplete  Resp Panel by RT-PCR (Flu A&B, Covid) Nasopharyngeal Swab     Status: None   Collection Time: 11/10/20  1:33 PM   Specimen: Nasopharyngeal Swab; Nasopharyngeal(NP) swabs in vial transport medium  Result Value Ref Range Status   SARS Coronavirus 2 by RT PCR NEGATIVE NEGATIVE Final    Comment: (NOTE) SARS-CoV-2 target nucleic acids are NOT DETECTED.  The SARS-CoV-2 RNA is generally detectable in upper respiratory specimens during the acute phase of infection. The lowest concentration of SARS-CoV-2 viral copies this assay can detect is 138 copies/mL. A negative result does not preclude SARS-Cov-2 infection and should not be used as the sole basis for treatment or other patient management decisions. A negative result may occur with  improper specimen collection/handling, submission of specimen other than nasopharyngeal swab, presence of viral mutation(s) within the areas targeted by this assay, and inadequate number of viral copies(<138 copies/mL). A negative result must be combined with clinical observations, patient history, and epidemiological information. The expected result is Negative.  Fact Sheet for Patients:  EntrepreneurPulse.com.au  Fact Sheet for Healthcare Providers:  IncredibleEmployment.be  This test is no t yet approved or cleared by the [place] FDA and  has been authorized for detection and/or diagnosis of SARS-CoV-2 by FDA under an Emergency Use Authorization (EUA). This EUA will remain  in effect (meaning this test can be used) for the duration of the COVID-19 declaration under Section 564(b)(1) of the Act, 21 U.S.C.section 360bbb-3(b)(1), unless the authorization is terminated  or revoked sooner.       Influenza A by PCR NEGATIVE NEGATIVE Final   Influenza B by PCR NEGATIVE NEGATIVE Final    Comment:  (NOTE) The Xpert Xpress SARS-CoV-2/FLU/RSV plus assay is intended as an aid in the diagnosis of influenza from Nasopharyngeal swab specimens and should not be used as a sole basis for treatment. Nasal washings and aspirates are unacceptable for Xpert Xpress SARS-CoV-2/FLU/RSV testing.  Fact Sheet for Patients: EntrepreneurPulse.com.au  Fact Sheet for Healthcare Providers: IncredibleEmployment.be  This test is not yet approved or cleared by the [place] FDA and has been authorized for detection and/or diagnosis of SARS-CoV-2 by FDA under an Emergency Use Authorization (EUA). This EUA will remain in effect (meaning this test can be used) for the duration of the COVID-19 declaration under Section 564(b)(1) of the Act, 21 U.S.C.  section 360bbb-3(b)(1), unless the authorization is terminated or revoked.  Performed at [facility], [place] [address] [place], [place] [address]      Radiology Studies: No results found. CT Angio Aortobifemoral W and/or Wo Contrast  Final Result    DG Chest Port 1 View  Final Result      Scheduled Meds: . cholecalciferol  1,000 Units Oral Daily  . cycloSPORINE  2 drop Both Eyes BID  . diclofenac Sodium  2 g Topical QID  . feeding supplement  237 mL Oral BID BM  . gabapentin  200 mg Oral TID  . latanoprost  1 drop Both Eyes QHS  . mometasone-formoterol  2 puff Inhalation BID  . oxyCODONE  5 mg Oral Q6H  . pantoprazole  40 mg Oral Daily  . pravastatin  20 mg Oral QHS  . senna-docusate  1 tablet Oral BID   PRN Meds: acetaminophen **OR** acetaminophen, albuterol, albuterol, antiseptic oral rinse, benzocaine, haloperidol lactate, meclizine, ondansetron **OR** ondansetron (ZOFRAN) IV, oxyCODONE, polyethylene glycol, tiZANidine Continuous Infusions: . heparin 800 Units/hr (11/13/20 0043)     LOS: 2 days  Time spent: Greater than 50% of the 35 minute visit was spent in  counseling/coordination of care for the patient as laid out in the A&P.   Dwyane Dee, MD [facility] 11/14/2020, 11:31 AM

## 2020-11-14 NOTE — Progress Notes (Signed)
[place] for Heparin Indication: PVD, possible ischemic foot  Allergies  Allergen Reactions  . Aspirin Anaphylaxis  . Morphine And Related Other (See Comments)    confusion    Patient Measurements: Height: 5' (152.4 cm) Weight: 67 kg (147 lb 11.3 oz) IBW/kg (Calculated) : 45.5 Heparin Dosing Weight: 59.9 kg  Vital Signs: Temp: 98.5 F (36.9 C) (04/16 2142) Temp Source: Axillary (04/16 2142) BP: 103/31 (04/16 2142) Pulse Rate: 61 (04/16 2142)  Labs: Recent Labs    11/12/20 0228 11/13/20 0057 11/14/20 0125  HGB 11.7* 11.5* 11.7*  HCT 35.7* 35.0* 35.7*  PLT 257 245 243  HEPARINUNFRC 0.36 0.54 0.46    Estimated Creatinine Clearance: 34 mL/min (by C-G formula based on SCr of 0.94 mg/dL).   Assessment: 87 yo female with hx of PVD s/p amputation of RLE and L common iliac stenting admitted with L leg pain.  CT shows occlusions of the left common iliac and SFA.  Pharmacy is consulted to dose IV heparin.  Heparin level therapeutic (0.46) on gtt at 800 units/hr. CBC stable. No bleeding noted.   Goal of Therapy:  Heparin level 0.3-0.7 units/ml Monitor platelets by anticoagulation protocol: Yes   Plan:  Continue heparin 800 units/hr  Monitor daily heparin level, CBC  Romilda Garret, PharmD PGY1 Acute Care Pharmacy Resident 11/14/2020 7:38 AM  Please check [email or web address] for unit specific pharmacy phone numbers.

## 2020-11-14 NOTE — Plan of Care (Signed)
  Problem: Education: Goal: Knowledge of General Education information will improve Description Including pain rating scale, medication(s)/side effects and non-pharmacologic comfort measures Outcome: Progressing   

## 2020-11-15 DIAGNOSIS — Z7189 Other specified counseling: Secondary | ICD-10-CM

## 2020-11-15 LAB — CBC
HCT: 38.5 % (ref 36.0–46.0)
Hemoglobin: 12.5 g/dL (ref 12.0–15.0)
MCH: 31.3 pg (ref 26.0–34.0)
MCHC: 32.5 g/dL (ref 30.0–36.0)
MCV: 96.3 fL (ref 80.0–100.0)
Platelets: 275 10*3/uL (ref 150–400)
RBC: 4 MIL/uL (ref 3.87–5.11)
RDW: 14 % (ref 11.5–15.5)
WBC: 5.5 10*3/uL (ref 4.0–10.5)
nRBC: 0 % (ref 0.0–0.2)

## 2020-11-15 LAB — CULTURE, BLOOD (ROUTINE X 2)
Culture: NO GROWTH
Culture: NO GROWTH
Special Requests: ADEQUATE

## 2020-11-15 LAB — HEPARIN LEVEL (UNFRACTIONATED): Heparin Unfractionated: 0.38 IU/mL (ref 0.30–0.70)

## 2020-11-15 MED ORDER — LOSARTAN POTASSIUM 50 MG PO TABS
50.0000 mg | ORAL_TABLET | Freq: Every day | ORAL | Status: DC
  Administered 2020-11-16 – 2020-11-20 (×5): 50 mg via ORAL
  Filled 2020-11-15 (×5): qty 1

## 2020-11-15 NOTE — Progress Notes (Signed)
Progress Note    [name]   GQQ:[id number]  DOB: Apr 10, 1928  DOA: 11/10/2020     3  PCP: [name], MD  CC: left leg pain   Hospital Course: [name] is a 87 y.o. female with medical history significant of HTN, PVD, PAOD, HLD. Presenting with left leg pain. Hx from granddtr at bedside. Her granddtr reports that the patient has been complaining of pain more frequently at home and having more difficulty with ambulation.  Over the past several weeks to months, her granddaughter has noticed an overall gradual decline in the patient's ability to thrive in general.  She also notes that the patient has been becoming more confused/forgetful at times (example provided was she is now forgetting to pay bills which was never an issue in the past).  She also has been more dependent recently on pain medications due to the ongoing pain, spasms, cramping in her left leg.  She is also not eating as much as previously due to pain associated with eating as well at times.  She was admitted for vascular surgery evaluation. After evaluation, her occlusive disease was considered rather remarkable and intervention would be high risk with the patient's underlying comorbidities, frailty, and overall debility.  Palliative care was recommended.   Interval History:  No events overnight.  Granddaughter not present this morning, taking some respite care at home.  Patient was comfortable resting in bed.  Still ongoing pain but no worse in her left leg. Mood was pleasant.  ROS: Constitutional: negative for chills and fevers, Respiratory: negative for cough, Cardiovascular: negative for chest pain and Gastrointestinal: positive for abdominal pain  Assessment & Plan:  Severe PVD - patient has occluded celiac trunk with retrograde flow from SMA and PDA branches; calcified plaque at aortic bifurcation and origin of right CIA; right EIA heavily calcified and small; junction of right EIA and proximal CFA has  high-grade stenosis w/ short segment occlusion; calcified plaque in right CFA; no significant flow in right SFA (and R AKA noted); Left CIA is stented and occluded; left EIA has diffuse atherosclerotic disease; left CFA is heavily calcified but patent; left SFA occluded at origin (reconstitution of flow in popliteal artery above-knee); no significant flow in PTA; peroneal artery patent to ankle; areas of occlusion involving anterior tibial artery noted; no significant flow in dorsalis pedis artery -Has been evaluated by vascular surgery.  Intervention would require overly aggressive measures (starting in right brachial artery possibly) - vascular surgery and myself have dicussed that palliative care is more appropriate at this time and intervention may be more harm than good at this point; patient is also starting to exhibit some signs of dementia per her granddaughter and she has been also not eating as much probably in part due to her mesenteric ischemia as well -Granddaughter and patient have decided on bringing her home with hospice but now some thought being given to possibly SNF given her level of care being required -Continue heparin drip for now while in hospital given patient associating pain relief with this -Continue oxycodone and modify as needed -Palliative care now following and has had family discussion on 11/12/2020.  Follow-up ongoing discussions for definitive disposition whether it is home or SNF  HTN -on amlodipine, losartan at home.  On 11/14/2020, blood pressure soft; meds held and blood pressure has recovered -Will resume home meds on 11/15/2020  HLD -On pravastatin currently.  Does have severe PAD however mortality benefit from statin questionable at this  point; if going to pursue less aggressive measures, will probably discontinue statin at that time  Hx NSCLC  - stable; continue inhalers   Elevated troponin  - flat trend; low suspicion for ACS at this nor would be a good  candidate - continue heparin drip for now   Old records reviewed in assessment of this patient  Antimicrobials: n/a  DVT prophylaxis: Heparin drip    Code Status:   Code Status: DNR Family Communication: granddaughter   Disposition Plan: Status is: Inpatient  Remains inpatient appropriate because:IV treatments appropriate due to intensity of illness or inability to take PO and Inpatient level of care appropriate due to severity of illness   Dispo: The patient is from: Home              Anticipated d/c is to: Home              Patient currently is not medically stable to d/c.   Difficult to place patient No  Risk of unplanned readmission score: Unplanned Admission- Pilot do not use: 20.03   Objective: Blood pressure (!) 154/66, pulse 69, temperature 97.9 F (36.6 C), temperature source Axillary, resp. rate 20, height 5' (1.524 m), weight 67 kg, SpO2 100 %.  Examination: General appearance: Chronically ill-appearing elderly woman laying in bed appearing uncomfortable but no acute distress Head: Normocephalic, without obvious abnormality, atraumatic Eyes: EOMI Lungs: clear to auscultation bilaterally Heart: regular rate and rhythm and S1, S2 normal Abdomen: Thin, nonspecific tenderness to palpation, bowel sounds present Extremities: Cool left foot to the touch with darkening of skin on toes along with further hardening of distal tip of digits consistent with ischemia.  Unable to palpate DP/PT pulses.  No edema noted.  No ulcers noted.  Right AKA noted.  Left leg tender to palpation throughout Skin: mobility and turgor normal Neurologic: follows commands, moves all 4 extremities   Consultants:   Vascular surgery  PCM  Procedures:   n/a  Data Reviewed: I have personally reviewed following labs and imaging studies Results for orders placed or performed during the hospital encounter of 11/10/20 (from the past 24 hour(s))  Heparin level (unfractionated)     Status: None    Collection Time: 11/15/20  2:20 AM  Result Value Ref Range   Heparin Unfractionated 0.38 0.30 - 0.70 IU/mL  CBC     Status: None   Collection Time: 11/15/20  2:20 AM  Result Value Ref Range   WBC 5.5 4.0 - 10.5 K/uL   RBC 4.00 3.87 - 5.11 MIL/uL   Hemoglobin 12.5 12.0 - 15.0 g/dL   HCT 38.5 36.0 - 46.0 %   MCV 96.3 80.0 - 100.0 fL   MCH 31.3 26.0 - 34.0 pg   MCHC 32.5 30.0 - 36.0 g/dL   RDW 14.0 11.5 - 15.5 %   Platelets 275 150 - 400 K/uL   nRBC 0.0 0.0 - 0.2 %    Recent Results (from the past 240 hour(s))  Culture, blood (routine x 2)     Status: None   Collection Time: 11/10/20  9:55 AM   Specimen: BLOOD  Result Value Ref Range Status   Specimen Description   Final    BLOOD LEFT ANTECUBITAL Performed at [facility], [place] [address], [place], [place] [address]    Special Requests   Final    BOTTLES DRAWN AEROBIC AND ANAEROBIC Blood Culture results may not be optimal due to an inadequate volume of blood received in culture bottles  Performed at [facility], [place] [address], [place], [place] [address]    Culture   Final    NO GROWTH 5 DAYS Performed at [facility], [place] [address], [place], [place] [address]    Report Status 11/15/2020 FINAL  Final  Culture, blood (routine x 2)     Status: None   Collection Time: 11/10/20 10:27 AM   Specimen: BLOOD  Result Value Ref Range Status   Specimen Description   Final    BLOOD BLOOD RIGHT FOREARM Performed at [place] [address], [place], [place] [address]    Special Requests   Final    BOTTLES DRAWN AEROBIC AND ANAEROBIC Blood Culture adequate volume Performed at [place] [address], [place], [place] [address]    Culture   Final    NO GROWTH 5 DAYS Performed at [facility], [place] [address], [place], [place] [address]    Report Status 11/15/2020 FINAL  Final  Resp Panel by RT-PCR (Flu A&B, Covid) Nasopharyngeal Swab      Status: None   Collection Time: 11/10/20  1:33 PM   Specimen: Nasopharyngeal Swab; Nasopharyngeal(NP) swabs in vial transport medium  Result Value Ref Range Status   SARS Coronavirus 2 by RT PCR NEGATIVE NEGATIVE Final    Comment: (NOTE) SARS-CoV-2 target nucleic acids are NOT DETECTED.  The SARS-CoV-2 RNA is generally detectable in upper respiratory specimens during the acute phase of infection. The lowest concentration of SARS-CoV-2 viral copies this assay can detect is 138 copies/mL. A negative result does not preclude SARS-Cov-2 infection and should not be used as the sole basis for treatment or other patient management decisions. A negative result may occur with  improper specimen collection/handling, submission of specimen other than nasopharyngeal swab, presence of viral mutation(s) within the areas targeted by this assay, and inadequate number of viral copies(<138 copies/mL). A negative result must be combined with clinical observations, patient history, and epidemiological information. The expected result is Negative.  Fact Sheet for Patients:  EntrepreneurPulse.com.au  Fact Sheet for Healthcare Providers:  IncredibleEmployment.be  This test is no t yet approved or cleared by the [place] FDA and  has been authorized for detection and/or diagnosis of SARS-CoV-2 by FDA under an Emergency Use Authorization (EUA). This EUA will remain  in effect (meaning this test can be used) for the duration of the COVID-19 declaration under Section 564(b)(1) of the Act, 21 U.S.C.section 360bbb-3(b)(1), unless the authorization is terminated  or revoked sooner.       Influenza A by PCR NEGATIVE NEGATIVE Final   Influenza B by PCR NEGATIVE NEGATIVE Final    Comment: (NOTE) The Xpert Xpress SARS-CoV-2/FLU/RSV plus assay is intended as an aid in the diagnosis of influenza from Nasopharyngeal swab specimens and should not be used as a sole basis  for treatment. Nasal washings and aspirates are unacceptable for Xpert Xpress SARS-CoV-2/FLU/RSV testing.  Fact Sheet for Patients: EntrepreneurPulse.com.au  Fact Sheet for Healthcare Providers: IncredibleEmployment.be  This test is not yet approved or cleared by the [place] FDA and has been authorized for detection and/or diagnosis of SARS-CoV-2 by FDA under an Emergency Use Authorization (EUA). This EUA will remain in effect (meaning this test can be used) for the duration of the COVID-19 declaration under Section 564(b)(1) of the Act, 21 U.S.C. section 360bbb-3(b)(1), unless the authorization is terminated or revoked.  Performed at [facility], [place] [address] [place],  [address]  Radiology Studies: No results found. CT Angio Aortobifemoral W and/or Wo Contrast  Final Result    DG Chest Port 1 View  Final Result      Scheduled Meds: . cholecalciferol  1,000 Units Oral Daily  . cycloSPORINE  2 drop Both Eyes BID  . diclofenac Sodium  2 g Topical QID  . feeding supplement  237 mL Oral BID BM  . gabapentin  200 mg Oral TID  . latanoprost  1 drop Both Eyes QHS  . mometasone-formoterol  2 puff Inhalation BID  . oxyCODONE  5 mg Oral Q6H  . pantoprazole  40 mg Oral Daily  . pravastatin  20 mg Oral QHS  . senna-docusate  1 tablet Oral BID   PRN Meds: acetaminophen **OR** acetaminophen, albuterol, albuterol, antiseptic oral rinse, benzocaine, haloperidol lactate, meclizine, ondansetron **OR** ondansetron (ZOFRAN) IV, oxyCODONE, polyethylene glycol, tiZANidine Continuous Infusions: . heparin 800 Units/hr (11/13/20 0043)     LOS: 3 days  Time spent: Greater than 50% of the 35 minute visit was spent in counseling/coordination of care for the patient as laid out in the A&P.   Dwyane Dee, MD [facility] 11/15/2020, 12:40 PM

## 2020-11-15 NOTE — NC FL2 (Signed)
[place] MEDICAID FL2 LEVEL OF CARE SCREENING TOOL     IDENTIFICATION  Patient Name: [name] Birthdate: 29-Nov-1927 Sex: female Admission Date (Current Location): 11/10/2020  [facility] and [place] Number:  Herbalist and Address:  [facility], [place] [address], [place], [place] [address]      Provider Number: [id number]  Attending Physician Name and Address:  Dwyane Dee, MD  Relative Name and Phone Number:  Elwyn Lade, [phone]    Current Level of Care: Hospital Recommended Level of Care: [place] Prior Approval Number:    Date Approved/Denied:   PASRR Number: [id number]  Discharge Plan: SNF    Current Diagnoses: Patient Active Problem List   Diagnosis Date Noted  . Arterial occlusion   . PVD (peripheral vascular disease) ([place]) 11/12/2020  . Malnutrition of moderate degree 11/12/2020  . Goals of care, counseling/discussion   . Palliative care by specialist   . Altered mental status   . Ischemic foot 11/10/2020  . Cough 02/15/2016  . Bilateral sensorineural hearing loss 12/22/2015  . Sepsis ([place]) 10/26/2015  . Vertigo 08/03/2015  . Full code status 05/10/2015  . Numbness-Left Heel and Toes  10/28/2014  . Sharp pain- Left Lateral Thigh and Right Stump 10/28/2014  . Cancer of lower lobe of left lung ([place]) 06/07/2013  . COPD GOLD I 04/18/2013  . Hemoptysis 04/10/2013  . Pain in joint, lower leg 03/19/2013  . Iliac artery stenosis, left ([place]) 03/13/2012  . Peripheral vascular disease, unspecified ([place]) 02/28/2012  . Chest pain 10/04/2011  . Hypertension 10/04/2011  . Hyperlipidemia 10/04/2011    Orientation RESPIRATION BLADDER Height & Weight     Self  Normal Incontinent Weight: 147 lb 11.3 oz (67 kg) Height:  5' (152.4 cm)  BEHAVIORAL SYMPTOMS/MOOD NEUROLOGICAL BOWEL NUTRITION STATUS      Incontinent Diet (See DC summary)  AMBULATORY STATUS COMMUNICATION OF NEEDS Skin   Extensive Assist  Verbally Surgical wounds (R BKA w/ prosthetic)                       Personal Care Assistance Level of Assistance  Bathing,Feeding,Dressing Bathing Assistance: Maximum assistance Feeding assistance: Limited assistance Dressing Assistance: Maximum assistance     Functional Limitations Info  Sight,Speech,Hearing Sight Info: Adequate Hearing Info: Impaired Speech Info: Adequate    SPECIAL CARE FACTORS FREQUENCY  PT (By licensed PT),OT (By licensed OT)     PT Frequency: 5x week OT Frequency: 5x week            Contractures Contractures Info: Not present    Additional Factors Info  Code Status,Allergies Code Status Info: DNR Allergies Info: Asprin, Morphine           Current Medications (11/15/2020):  This is the current hospital active medication list Current Facility-Administered Medications  Medication Dose Route Frequency Provider Last Rate Last Admin  . acetaminophen (TYLENOL) tablet 650 mg  650 mg Oral Q6H PRN Marylyn Ishihara, Tyrone A, DO       Or  . acetaminophen (TYLENOL) suppository 650 mg  650 mg Rectal Q6H PRN Marylyn Ishihara, Tyrone A, DO      . albuterol (PROVENTIL) (2.5 MG/3ML) 0.083% nebulizer solution 2.5 mg  2.5 mg Nebulization Q6H PRN Kyle, Tyrone A, DO      . albuterol (VENTOLIN HFA) 108 (90 Base) MCG/ACT inhaler 2 puff  2 puff Inhalation Q6H PRN Kyle, Tyrone A, DO      . antiseptic oral rinse (BIOTENE) solution  15 mL  15 mL Mouth Rinse PRN Dwyane Dee, MD      . benzocaine (ORAJEL) 10 % mucosal gel   Mouth/Throat QID PRN Dwyane Dee, MD   Given at 11/14/20 1039  . cholecalciferol (VITAMIN D3) tablet 1,000 Units  1,000 Units Oral Daily Kyle, Tyrone A, DO   1,000 Units at 11/15/20 0953  . cycloSPORINE (RESTASIS) 0.05 % ophthalmic emulsion 2 drop  2 drop Both Eyes BID Marylyn Ishihara, Tyrone A, DO   2 drop at 11/15/20 0953  . diclofenac Sodium (VOLTAREN) 1 % topical gel 2 g  2 g Topical QID Cooper, Josseline P, PA-C   2 g at 11/15/20 1252  . feeding supplement (ENSURE ENLIVE  / ENSURE PLUS) liquid 237 mL  237 mL Oral BID BM Dwyane Dee, MD   237 mL at 11/15/20 1252  . gabapentin (NEURONTIN) capsule 200 mg  200 mg Oral TID Dwyane Dee, MD   200 mg at 11/15/20 6389  . haloperidol lactate (HALDOL) injection 2 mg  2 mg Intravenous Q6H PRN Dwyane Dee, MD      . heparin ADULT infusion 100 units/mL (25000 units/271mL)  800 Units/hr Intravenous Continuous Horton, Alvin Critchley, DO 8 mL/hr at 11/13/20 0043 800 Units/hr at 11/13/20 0043  . latanoprost (XALATAN) 0.005 % ophthalmic solution 1 drop  1 drop Both Eyes QHS Kyle, Tyrone A, DO   1 drop at 11/14/20 2116  . losartan (COZAAR) tablet 50 mg  50 mg Oral Daily Dwyane Dee, MD      . meclizine (ANTIVERT) tablet 25 mg  25 mg Oral Q8H PRN Marylyn Ishihara, Tyrone A, DO      . mometasone-formoterol (DULERA) 200-5 MCG/ACT inhaler 2 puff  2 puff Inhalation BID Marylyn Ishihara, Tyrone A, DO   2 puff at 11/15/20 0810  . ondansetron (ZOFRAN) tablet 4 mg  4 mg Oral Q6H PRN Marylyn Ishihara, Tyrone A, DO       Or  . ondansetron (ZOFRAN) injection 4 mg  4 mg Intravenous Q6H PRN Marylyn Ishihara, Tyrone A, DO   4 mg at 11/13/20 1153  . oxyCODONE (Oxy IR/ROXICODONE) immediate release tablet 2.5 mg  2.5 mg Oral Q2H PRN Cooper, Josseline P, PA-C   2.5 mg at 11/14/20 2120  . oxyCODONE (Oxy IR/ROXICODONE) immediate release tablet 5 mg  5 mg Oral Q6H Dwyane Dee, MD   5 mg at 11/15/20 1252  . pantoprazole (PROTONIX) EC tablet 40 mg  40 mg Oral Daily Kyle, Tyrone A, DO   40 mg at 11/15/20 0953  . polyethylene glycol (MIRALAX / GLYCOLAX) packet 17 g  17 g Oral Daily PRN Dwyane Dee, MD      . pravastatin (PRAVACHOL) tablet 20 mg  20 mg Oral QHS Kyle, Tyrone A, DO   20 mg at 11/14/20 2121  . senna-docusate (Senokot-S) tablet 1 tablet  1 tablet Oral BID Dwyane Dee, MD   1 tablet at 11/15/20 0954  . tiZANidine (ZANAFLEX) tablet 2 mg  2 mg Oral BID PRN Cherylann Ratel A, DO   2 mg at 11/11/20 1209     Discharge Medications: Please see discharge summary for a list of discharge  medications.  Relevant Imaging Results:  Relevant Lab Results:   Additional Information SS# [place], [place]

## 2020-11-15 NOTE — Progress Notes (Signed)
[place] for Heparin Indication: PVD, possible ischemic foot  Allergies  Allergen Reactions  . Aspirin Anaphylaxis  . Morphine And Related Other (See Comments)    confusion    Patient Measurements: Height: 5' (152.4 cm) Weight: 67 kg (147 lb 11.3 oz) IBW/kg (Calculated) : 45.5 Heparin Dosing Weight: 59.9 kg  Vital Signs: Temp: 97.7 F (36.5 C) (04/18 0402) Temp Source: Oral (04/18 0402) BP: 152/61 (04/18 0402) Pulse Rate: 88 (04/18 0402)  Labs: Recent Labs    11/13/20 0057 11/14/20 0125 11/15/20 0220  HGB 11.5* 11.7* 12.5  HCT 35.0* 35.7* 38.5  PLT 245 243 275  HEPARINUNFRC 0.54 0.46 0.38    Estimated Creatinine Clearance: 34 mL/min (by C-G formula based on SCr of 0.94 mg/dL).   Assessment: 87 yo female with hx of PVD s/p amputation of RLE and L common iliac stenting admitted with L leg pain.  CT shows occlusions of the left common iliac and SFA.  Pharmacy is consulted to dose IV heparin.  Heparin level therapeutic (0.38) on gtt at 800 units/hr. CBC stable. No bleeding noted.   Goal of Therapy:  Heparin level 0.3-0.7 units/ml Monitor platelets by anticoagulation protocol: Yes   Plan:  Continue heparin 800 units/hr  Monitor daily heparin level, CBC   Thank you for allowing [place] to participate in this patients care. Jens Som, PharmD 11/15/2020 7:39 AM  Please check [email or web address] for unit-specific pharmacy phone numbers.

## 2020-11-16 LAB — CBC
HCT: 36.6 % (ref 36.0–46.0)
Hemoglobin: 12 g/dL (ref 12.0–15.0)
MCH: 31.4 pg (ref 26.0–34.0)
MCHC: 32.8 g/dL (ref 30.0–36.0)
MCV: 95.8 fL (ref 80.0–100.0)
Platelets: 276 10*3/uL (ref 150–400)
RBC: 3.82 MIL/uL — ABNORMAL LOW (ref 3.87–5.11)
RDW: 14 % (ref 11.5–15.5)
WBC: 6.3 10*3/uL (ref 4.0–10.5)
nRBC: 0 % (ref 0.0–0.2)

## 2020-11-16 LAB — HEPARIN LEVEL (UNFRACTIONATED)
Heparin Unfractionated: 0.17 IU/mL — ABNORMAL LOW (ref 0.30–0.70)
Heparin Unfractionated: 0.31 IU/mL (ref 0.30–0.70)

## 2020-11-16 MED ORDER — MORPHINE SULFATE (PF) 2 MG/ML IV SOLN
2.0000 mg | Freq: Once | INTRAVENOUS | Status: AC
  Administered 2020-11-16: 2 mg via INTRAVENOUS
  Filled 2020-11-16: qty 1

## 2020-11-16 NOTE — Progress Notes (Signed)
[place] for Heparin Indication: PVD, possible ischemic foot  Labs: Recent Labs    11/14/20 0125 11/15/20 0220 11/16/20 0030  HGB 11.7* 12.5 12.0  HCT 35.7* 38.5 36.6  PLT 243 275 276  HEPARINUNFRC 0.46 0.38 0.17*    Estimated Creatinine Clearance: 34 mL/min (by C-G formula based on SCr of 0.94 mg/dL).   Assessment: 87 yo female with hx of PVD s/p amputation of RLE and L common iliac stenting admitted with L leg pain.  CT shows occlusions of the left common iliac and SFA.  Pharmacy is consulted to dose IV heparin.  Heparin level this morning 0.17 units/ml.  Heparin may have been interrupted last night according to charting.  RN on duty now says has been infusing okay since midnight.  Goal of Therapy:  Heparin level 0.3-0.7 units/ml Monitor platelets by anticoagulation protocol: Yes   Plan:  Increase heparin slightly to 850 units/hr  Check heparin level in 8 hours Monitor daily heparin level, CBC   Thank you for allowing [place] to participate in this patients care. Excell Seltzer, PharmD

## 2020-11-16 NOTE — Care Management Important Message (Signed)
Important Message  Patient Details  Name: [name] MRN: [id number] Date of Birth: 01/12/1928   Medicare Important Message Given:  Yes     Shelda Altes 11/16/2020, 12:11 PM

## 2020-11-16 NOTE — Progress Notes (Signed)
Daily Progress Note   Patient Name: [name]       Date: 11/16/2020 DOB: 31-Mar-1928  Age: 87 y.o. MRN#: [id number] Attending Physician: Dwyane Dee, MD Primary Care Physician: [name], MD Admit Date: 11/10/2020  Reason for Consultation/Follow-up: Establishing goals of care and Psychosocial/spiritual support  Subjective: Medical records reviewed. Assessed patient at the bedside. She appears mildly uncomfortable with a furrowed brow. Difficult to understand her speech. No family present at the bedside.  Called granddaughter [name] to provide support and further delineate goals of care and preferences for patient's discharge. During our last discussion, she was leaning towards SNF with hospice. [name] reports that she is now feeling more hopeful that home hospice will be a feasible solution. This would be preferred, as [facility] has agreed to continue oral heparin for the patient in hospice care.  [name] has received approval for increased cap of personal care hours offered by [facility] through state/DSS resources. She has excellent support while making these arrangements and communication is ongoing. She is also in the process of appealing for home assistance from [facility]. However, she would like to receive a list of SNFs in [place] "if worst comes to worst." She states it would be an honor to care for her grandmother at home but realizes she needs to prepare for all possibilities and needs. She agrees to provide update on expected timeline for these services to be finalized.   We discussed patient's pain management with Oxycodone. Counseled on the regimen's effectiveness and [name] agrees she has seen an improvement in patient's pain. She understands we will continue scheduled Oxycodone  without adjustments at this time. Addressed her request for Seroquel and educated on the risks of sedation without impacting patient's pain levels.   Questions and concerns addressed. PMT will continue to support holistically.     Length of Stay: 4  Current Medications: Scheduled Meds:  . cholecalciferol  1,000 Units Oral Daily  . cycloSPORINE  2 drop Both Eyes BID  . diclofenac Sodium  2 g Topical QID  . feeding supplement  237 mL Oral BID BM  . gabapentin  200 mg Oral TID  . latanoprost  1 drop Both Eyes QHS  . losartan  50 mg Oral Daily  . mometasone-formoterol  2 puff Inhalation BID  . oxyCODONE  5 mg Oral  Q6H  . pantoprazole  40 mg Oral Daily  . pravastatin  20 mg Oral QHS  . senna-docusate  1 tablet Oral BID    Continuous Infusions: . heparin 850 Units/hr (11/16/20 0303)    PRN Meds: acetaminophen **OR** acetaminophen, albuterol, albuterol, antiseptic oral rinse, benzocaine, haloperidol lactate, meclizine, ondansetron **OR** ondansetron (ZOFRAN) IV, oxyCODONE, polyethylene glycol, tiZANidine  Physical Exam Vitals and nursing note reviewed.  Cardiovascular:     Rate and Rhythm: Normal rate.  Pulmonary:     Effort: Pulmonary effort is normal.  Musculoskeletal:     Right Lower Extremity: Right leg is amputated above knee.  Neurological:     Mental Status: She is alert. Mental status is at baseline.  Psychiatric:        Behavior: Behavior is cooperative.             Vital Signs: BP 121/85 (BP Location: Left Arm)   Pulse 70   Temp 98.1 F (36.7 C) (Oral)   Resp 20   Ht 5' (1.524 m)   Wt 67 kg   SpO2 98%   BMI 28.85 kg/m  SpO2: SpO2: 98 % O2 Device: O2 Device: Room Air O2 Flow Rate:    Intake/output summary: No intake or output data in the 24 hours ending 11/16/20 1458 LBM: Last BM Date: 11/15/20 Baseline Weight: Weight: 67 kg Most recent weight: Weight: 67 kg       Palliative Assessment/Data: 30%     Patient Active Problem List   Diagnosis Date  Noted  . Arterial occlusion   . PVD (peripheral vascular disease) ([place]) 11/12/2020  . Malnutrition of moderate degree 11/12/2020  . Goals of care, counseling/discussion   . Palliative care by specialist   . Altered mental status   . Ischemic foot 11/10/2020  . Cough 02/15/2016  . Bilateral sensorineural hearing loss 12/22/2015  . Sepsis ([place]) 10/26/2015  . Vertigo 08/03/2015  . Numbness-Left Heel and Toes  10/28/2014  . Sharp pain- Left Lateral Thigh and Right Stump 10/28/2014  . Cancer of lower lobe of left lung ([place]) 06/07/2013  . COPD GOLD I 04/18/2013  . Hemoptysis 04/10/2013  . Pain in joint, lower leg 03/19/2013  . Iliac artery stenosis, left ([place]) 03/13/2012  . Peripheral vascular disease, unspecified ([place]) 02/28/2012  . Chest pain 10/04/2011  . Hypertension 10/04/2011  . Hyperlipidemia 10/04/2011    Palliative Care Assessment & Plan   Patient Profile: 87 y.o. female  with past medical history of HTN, PVD, PAOD, HLD, COPD, right AKA, lung cancer s/p radiation therapy admitted on 11/10/2020 with left leg pain.   She was found to have occlusions of the left common iliac and SFA. EDP spoke with Vascular surgery (Dr. Carlis Abbott). The patient was started on heparin gtt w/ rec to be admitted to [facility]. Patient is a poor candidate for revascularization. Palliative care has been consulted to assist with determining goals of care.   Assessment: Severe PVD Dementia  LLE pain  Recommendations/Plan: Ongoing goals of care discussions with discharge plan depending on [name]'s ability to secure enough support in the home Home hospice is still preferred, [facility] SNF as second option Discussed with Dr. Sabino Gasser and Steffanie Dunn CN regarding request for Seroquel and [name]'s interest in SNF options Continue Oxycodone 5mg  Q6H with PRN for breakthrough pain Counseled on patient's risk for sedation and delirium   Goals of Care and Additional Recommendations: Limitations on Scope of Treatment:  No Surgical Procedures  Code Status: DNR/DNI   Code Status Orders  (  From admission, onward)         Start     Ordered   11/12/20 1139  Do not attempt resuscitation (DNR)  Continuous       Question Answer Comment  In the event of cardiac or respiratory ARREST Do not call a "code blue"   In the event of cardiac or respiratory ARREST Do not perform Intubation, CPR, defibrillation or ACLS   In the event of cardiac or respiratory ARREST Use medication by any route, position, wound care, and other measures to relive pain and suffering. May use oxygen, suction and manual treatment of airway obstruction as needed for comfort.      11/12/20 1139        Code Status History    Date Active Date Inactive Code Status Order ID Comments User Context   11/10/2020 1810 11/12/2020 1139 Full Code [id number]  Jonnie Finner, DO Inpatient   06/16/2013 1508 06/25/2013 1408 Full Code [id number]  Sara Chu Inpatient   04/10/2013 1116 04/11/2013 1836 Full Code [id number]  Erick Colace, NP Inpatient   Advance Care Planning Activity      Prognosis:  Poor long-term prognosis given functional and cognitive decline, poor candidacy for vascular interventions, advanced age and multiple comorbidities  Discharge Planning: Home with Hospice  Care plan was discussed with patient's granddaughter/HCPOA [name], Dr. Sabino Gasser, Marvetta Gibbons CM  Total time: 15 minutes  Greater than 50% of this time was spent in counseling and coordinating care related to the above assessment and plan.  Dorthy Cooler, PA-C Palliative Medicine Team Team phone # [phone]  Thank you for allowing the Palliative Medicine Team to assist in the care of this patient. Please utilize secure chat with additional questions, if there is no response within 30 minutes please call the above phone number.  Palliative Medicine Team providers are available by phone from 7am to 7pm daily and can be reached through the team cell phone.   Should this patient require assistance outside of these hours, please call the patient's attending physician.

## 2020-11-16 NOTE — Progress Notes (Signed)
Progress Note    EDDITH MENTOR   OIZ:[id number]  DOB: 1928/04/16  DOA: 11/10/2020     4  PCP: [name], MD  CC: left leg pain   Hospital Course: LIS SAVITT is a 87 y.o. female with medical history significant of HTN, PVD, PAOD, HLD. Presenting with left leg pain. Hx from granddtr at bedside. Her granddtr reports that the patient has been complaining of pain more frequently at home and having more difficulty with ambulation.  Over the past several weeks to months, her granddaughter has noticed an overall gradual decline in the patient's ability to thrive in general.  She also notes that the patient has been becoming more confused/forgetful at times (example provided was she is now forgetting to pay bills which was never an issue in the past).  She also has been more dependent recently on pain medications due to the ongoing pain, spasms, cramping in her left leg.  She is also not eating as much as previously due to pain associated with eating as well at times.  She was admitted for vascular surgery evaluation. After evaluation, her occlusive disease was considered rather remarkable and intervention would be high risk with the patient's underlying comorbidities, frailty, and overall debility.  Palliative care was recommended.   Interval History:  No events overnight.  Complaining of more pain this morning.  Long conversation held bedside with patient as well as her granddaughter.  She appears to be becoming more care than might be able to be handled at home.  More consideration is now being given to SNF by her granddaughter. Regardless, patient has been refusing some medicines and not eating well since yesterday.  After discussing that her medications being given to her are to help with pain, the patient was more amenable for trying medicines when administered next time.  Giving her a dose of morphine this morning to help with some breakthrough pain given refusal of oxycodone  yesterday.  ROS: Constitutional: negative for chills and fevers, Respiratory: negative for cough, Cardiovascular: negative for chest pain and Gastrointestinal: positive for abdominal pain  Assessment & Plan:  Severe PVD - patient has occluded celiac trunk with retrograde flow from SMA and PDA branches; calcified plaque at aortic bifurcation and origin of right CIA; right EIA heavily calcified and small; junction of right EIA and proximal CFA has high-grade stenosis w/ short segment occlusion; calcified plaque in right CFA; no significant flow in right SFA (and R AKA noted); Left CIA is stented and occluded; left EIA has diffuse atherosclerotic disease; left CFA is heavily calcified but patent; left SFA occluded at origin (reconstitution of flow in popliteal artery above-knee); no significant flow in PTA; peroneal artery patent to ankle; areas of occlusion involving anterior tibial artery noted; no significant flow in dorsalis pedis artery -Has been evaluated by vascular surgery.  Intervention would require overly aggressive measures (starting in right brachial artery possibly) - vascular surgery and myself have dicussed that palliative care is more appropriate at this time and intervention may be more harm than good at this point; patient is also starting to exhibit some signs of dementia per her granddaughter and she has been also not eating as much probably in part due to her mesenteric ischemia as well -Granddaughter and patient have decided on bringing her home with hospice but now some thought being given to possibly SNF given her level of care being required -Continue heparin drip for now while in hospital given patient associating pain relief with  this -Continue oxycodone and modify as needed -Palliative care now following and has had family discussion on 11/12/2020.  Follow-up ongoing discussions for definitive disposition whether it is home or SNF  HTN -on amlodipine, losartan at home.  On  11/14/2020, blood pressure soft; meds held and blood pressure has recovered -Will resume losartan on 11/15/2020; stable, hold off on resuming amlodipine at this time  HLD -On pravastatin currently.  Does have severe PAD however mortality benefit from statin questionable at this point; if going to pursue less aggressive measures, will probably discontinue statin at that time  Hx NSCLC  - stable; continue inhalers   Elevated troponin  - flat trend; low suspicion for ACS at this nor would be a good candidate - continue heparin drip for now   Old records reviewed in assessment of this patient  Antimicrobials: n/a  DVT prophylaxis: Heparin drip    Code Status:   Code Status: DNR Family Communication: granddaughter   Disposition Plan: Status is: Inpatient  Remains inpatient appropriate because:IV treatments appropriate due to intensity of illness or inability to take PO and Inpatient level of care appropriate due to severity of illness   Dispo: The patient is from: Home              Anticipated d/c is to: Home              Patient currently is not medically stable to d/c.   Difficult to place patient No  Risk of unplanned readmission score: Unplanned Admission- Pilot do not use: 20.52   Objective: Blood pressure 121/85, pulse 70, temperature 98.1 F (36.7 C), temperature source Oral, resp. rate 20, height 5' (1.524 m), weight 67 kg, SpO2 98 %.  Examination: General appearance: Chronically ill-appearing elderly woman laying in bed appearing uncomfortable but no acute distress Head: Normocephalic, without obvious abnormality, atraumatic Eyes: EOMI Lungs: clear to auscultation bilaterally Heart: regular rate and rhythm and S1, S2 normal Abdomen: Thin, nonspecific tenderness to palpation, bowel sounds present Extremities: Cool left foot to the touch with darkening of skin on toes along with further hardening of distal tip of digits consistent with ischemia.  Unable to palpate DP/PT  pulses.  No edema noted.  No ulcers noted.  Right AKA noted.  Left leg tender to palpation throughout Skin: mobility and turgor normal Neurologic: follows commands, moves all 4 extremities   Consultants:   Vascular surgery  PCM  Procedures:   n/a  Data Reviewed: I have personally reviewed following labs and imaging studies Results for orders placed or performed during the hospital encounter of 11/10/20 (from the past 24 hour(s))  Heparin level (unfractionated)     Status: Abnormal   Collection Time: 11/16/20 12:30 AM  Result Value Ref Range   Heparin Unfractionated 0.17 (L) 0.30 - 0.70 IU/mL  CBC     Status: Abnormal   Collection Time: 11/16/20 12:30 AM  Result Value Ref Range   WBC 6.3 4.0 - 10.5 K/uL   RBC 3.82 (L) 3.87 - 5.11 MIL/uL   Hemoglobin 12.0 12.0 - 15.0 g/dL   HCT 36.6 36.0 - 46.0 %   MCV 95.8 80.0 - 100.0 fL   MCH 31.4 26.0 - 34.0 pg   MCHC 32.8 30.0 - 36.0 g/dL   RDW 14.0 11.5 - 15.5 %   Platelets 276 150 - 400 K/uL   nRBC 0.0 0.0 - 0.2 %  Heparin level (unfractionated)     Status: None   Collection Time: 11/16/20 10:58 AM  Result Value Ref Range   Heparin Unfractionated 0.31 0.30 - 0.70 IU/mL    Recent Results (from the past 240 hour(s))  Culture, blood (routine x 2)     Status: None   Collection Time: 11/10/20  9:55 AM   Specimen: BLOOD  Result Value Ref Range Status   Specimen Description   Final    BLOOD LEFT ANTECUBITAL Performed at [facility] [address] [place], [place] [address]    Special Requests   Final    BOTTLES DRAWN AEROBIC AND ANAEROBIC Blood Culture results may not be optimal due to an inadequate volume of blood received in culture bottles Performed at [facility] [address], [place], [place] [address]    Culture   Final    NO GROWTH 5 DAYS Performed at [facility], [place] [address], [place], [place] [address]    Report Status 11/15/2020 FINAL  Final  Culture, blood (routine  x 2)     Status: None   Collection Time: 11/10/20 10:27 AM   Specimen: BLOOD  Result Value Ref Range Status   Specimen Description   Final    BLOOD BLOOD RIGHT FOREARM Performed at [facility] [address], [place], [place] [address]    Special Requests   Final    BOTTLES DRAWN AEROBIC AND ANAEROBIC Blood Culture adequate volume Performed at [facility] [address], [place], [place] [address]    Culture   Final    NO GROWTH 5 DAYS Performed at [facility], [place] [address], [place],  [address]    Report Status 11/15/2020 FINAL  Final  Resp Panel by RT-PCR (Flu A&B, Covid) Nasopharyngeal Swab     Status: None   Collection Time: 11/10/20  1:33 PM   Specimen: Nasopharyngeal Swab; Nasopharyngeal(NP) swabs in vial transport medium  Result Value Ref Range Status   SARS Coronavirus 2 by RT PCR NEGATIVE NEGATIVE Final    Comment: (NOTE) SARS-CoV-2 target nucleic acids are NOT DETECTED.  The SARS-CoV-2 RNA is generally detectable in upper respiratory specimens during the acute phase of infection. The lowest concentration of SARS-CoV-2 viral copies this assay can detect is 138 copies/mL. A negative result does not preclude SARS-Cov-2 infection and should not be used as the sole basis for treatment or other patient management decisions. A negative result may occur with  improper specimen collection/handling, submission of specimen other than nasopharyngeal swab, presence of viral mutation(s) within the areas targeted by this assay, and inadequate number of viral copies(<138 copies/mL). A negative result must be combined with clinical observations, patient history, and epidemiological information. The expected result is Negative.  Fact Sheet for Patients:  EntrepreneurPulse.com.au  Fact Sheet for Healthcare Providers:  IncredibleEmployment.be  This test is no t yet approved or cleared by the  [place] FDA and  has been authorized for detection and/or diagnosis of SARS-CoV-2 by FDA under an Emergency Use Authorization (EUA). This EUA will remain  in effect (meaning this test can be used) for the duration of the COVID-19 declaration under Section 564(b)(1) of the Act, 21 U.S.C.section 360bbb-3(b)(1), unless the authorization is terminated  or revoked sooner.       Influenza A by PCR NEGATIVE NEGATIVE Final   Influenza B by PCR NEGATIVE NEGATIVE Final    Comment: (NOTE) The Xpert Xpress SARS-CoV-2/FLU/RSV plus assay is intended as an aid in the diagnosis of influenza from Nasopharyngeal swab specimens and should not be  used as a sole basis for treatment. Nasal washings and aspirates are unacceptable for Xpert Xpress SARS-CoV-2/FLU/RSV testing.  Fact Sheet for Patients: EntrepreneurPulse.com.au  Fact Sheet for Healthcare Providers: IncredibleEmployment.be  This test is not yet approved or cleared by the [place] FDA and has been authorized for detection and/or diagnosis of SARS-CoV-2 by FDA under an Emergency Use Authorization (EUA). This EUA will remain in effect (meaning this test can be used) for the duration of the COVID-19 declaration under Section 564(b)(1) of the Act, 21 U.S.C. section 360bbb-3(b)(1), unless the authorization is terminated or revoked.  Performed at [facility], [place] [address], [place], [place] [address]      Radiology Studies: No results found. CT Angio Aortobifemoral W and/or Wo Contrast  Final Result    DG Chest Port 1 View  Final Result      Scheduled Meds: . cholecalciferol  1,000 Units Oral Daily  . cycloSPORINE  2 drop Both Eyes BID  . diclofenac Sodium  2 g Topical QID  . feeding supplement  237 mL Oral BID BM  . gabapentin  200 mg Oral TID  . latanoprost  1 drop Both Eyes QHS  . losartan  50 mg Oral Daily  . mometasone-formoterol  2 puff Inhalation BID  .  oxyCODONE  5 mg Oral Q6H  . pantoprazole  40 mg Oral Daily  . pravastatin  20 mg Oral QHS  . senna-docusate  1 tablet Oral BID   PRN Meds: acetaminophen **OR** acetaminophen, albuterol, albuterol, antiseptic oral rinse, benzocaine, haloperidol lactate, meclizine, ondansetron **OR** ondansetron (ZOFRAN) IV, oxyCODONE, polyethylene glycol, tiZANidine Continuous Infusions: . heparin 850 Units/hr (11/16/20 0303)     LOS: 4 days  Time spent: Greater than 50% of the 35 minute visit was spent in counseling/coordination of care for the patient as laid out in the A&P.   Dwyane Dee, MD [facility] 11/16/2020, 1:07 PM

## 2020-11-16 NOTE — Progress Notes (Signed)
[place] for Heparin Indication: PVD, possible ischemic foot  Labs: Recent Labs    11/14/20 0125 11/15/20 0220 11/16/20 0030 11/16/20 1058  HGB 11.7* 12.5 12.0  --   HCT 35.7* 38.5 36.6  --   PLT 243 275 276  --   HEPARINUNFRC 0.46 0.38 0.17* 0.31    Estimated Creatinine Clearance: 34 mL/min (by C-G formula based on SCr of 0.94 mg/dL).   Assessment: 87 yo female with hx of PVD s/p amputation of RLE and L common iliac stenting admitted with L leg pain.  CT shows occlusions of the left common iliac and SFA.  Pharmacy is consulted to dose IV heparin. No procedures planned. Plans noted to continue heparin to help minimize pain -heparin level at goal, CBC stable    Goal of Therapy:  Heparin level 0.3-0.7 units/ml Monitor platelets by anticoagulation protocol: Yes   Plan:  -Continue heparin at 850 units/hr -Daily heparin level and CBC  Hildred Laser, PharmD Clinical Pharmacist **Pharmacist phone directory can now be found on [email or web address] ([email or web address]).  Listed under [place].

## 2020-11-17 LAB — CBC
HCT: 37.8 % (ref 36.0–46.0)
Hemoglobin: 12.4 g/dL (ref 12.0–15.0)
MCH: 30.8 pg (ref 26.0–34.0)
MCHC: 32.8 g/dL (ref 30.0–36.0)
MCV: 93.8 fL (ref 80.0–100.0)
Platelets: 269 10*3/uL (ref 150–400)
RBC: 4.03 MIL/uL (ref 3.87–5.11)
RDW: 13.7 % (ref 11.5–15.5)
WBC: 5.8 10*3/uL (ref 4.0–10.5)
nRBC: 0 % (ref 0.0–0.2)

## 2020-11-17 LAB — HEPARIN LEVEL (UNFRACTIONATED): Heparin Unfractionated: 0.38 IU/mL (ref 0.30–0.70)

## 2020-11-17 MED ORDER — ORAL CARE MOUTH RINSE
15.0000 mL | Freq: Two times a day (BID) | OROMUCOSAL | Status: DC
  Administered 2020-11-17 – 2020-11-19 (×5): 15 mL via OROMUCOSAL

## 2020-11-17 NOTE — TOC Progression Note (Addendum)
Transition of Care [facility]) - Progression Note    Patient Details  Name: [name] MRN: [id number] Date of Birth: 07-Aug-1927  [facility] CM/SW Contact  Vinie Sill, [place] Phone Number: 11/17/2020, 4:00 PM  Clinical Narrative:     CSW visit with patient- granddaughter in the room resting with patient- CSW  Informed Medicare.gov listing is on the table for her to review - she states understanding. CSW exit the room. Copy placed in chart  Thurmond Butts, MSW, LCSW Clinical Social Worker   Expected Discharge Plan: [place] Barriers to Discharge: Continued Medical Work up  Expected Discharge Plan and Services Expected Discharge Plan: [place]   Discharge Planning Services: CM Consult   Living arrangements for the past 2 months: Apartment                                       Social Determinants of Health (SDOH) Interventions    Readmission Risk Interventions No flowsheet data found.

## 2020-11-17 NOTE — TOC Progression Note (Signed)
Transition of Care [facility]) - Progression Note    Patient Details  Name: [name] MRN: [id number] Date of Birth: 08/29/1927  Transition of Care [facility]) CM/SW [place], [place] Phone Number: 11/17/2020, 3:21 PM  Clinical Narrative:     CSW spoke with patient's granddaughter,[name]. She states she is agreeable to SNF as back up plan. She requested SNF search in the [facility] area. CSW faxed clinicals to [facility], [facility], and [facility]. CSW left message to return call for [facility] [place] to obtain fax number).   CSW left [email or web address] SNF listing in the patient's room for granddaughter to review.   CSW will continue to follow and assist with discharge planning.   Thurmond Butts, MSW, LCSW Clinical Social Worker   Expected Discharge Plan: [place] Barriers to Discharge: Continued Medical Work up  Expected Discharge Plan and Services Expected Discharge Plan: [place]   Discharge Planning Services: CM Consult   Living arrangements for the past 2 months: Apartment                                       Social Determinants of Health (SDOH) Interventions    Readmission Risk Interventions No flowsheet data found.

## 2020-11-17 NOTE — [place] Notes (Signed)
PROGRESS NOTE    [name]  VEL:[id number] DOB: 12/18/1927 DOA: 11/10/2020 PCP: [name], MD    Brief Narrative:  87 y.o.femalewith medical history significant ofHTN, PVD, PAOD, HLD. Presenting with left leg pain. Hx from granddtr at bedside. Her granddtr reports that the patient has been complaining of pain more frequently at home and having more difficulty with ambulation.  Over the past several weeks to months, her granddaughter has noticed an overall gradual decline in the patient's ability to thrive in general.  She also notes that the patient has been becoming more confused/forgetful at times (example provided was she is now forgetting to pay bills which was never an issue in the past).  She also has been more dependent recently on pain medications due to the ongoing pain, spasms, cramping in her left leg.  She is also not eating as much as previously due to pain associated with eating as well at times.  She was admitted for vascular surgery evaluation. After evaluation, her occlusive disease was considered rather remarkable and intervention would be high risk with the patient's underlying comorbidities, frailty, and overall debility.  Palliative care was recommended.   Assessment & Plan:   Active Problems:   Hypertension   Ischemic foot   PVD (peripheral vascular disease) (HCC)   Malnutrition of moderate degree   Goals of care, counseling/discussion   Palliative care by specialist   Altered mental status   Arterial occlusion  Severe PVD - patient has occluded celiac trunk with retrograde flow from SMA and PDA branches; calcified plaque at aortic bifurcation and origin of right CIA; right EIA heavily calcified and small; junction of right EIA and proximal CFA has high-grade stenosis w/ short segment occlusion; calcified plaque in right CFA; no significant flow in right SFA (and R AKA noted); Left CIA is stented and occluded; left EIA has diffuse atherosclerotic disease;  left CFA is heavily calcified but patent; left SFA occluded at origin (reconstitution of flow in popliteal artery above-knee); no significant flow in PTA; peroneal artery patent to ankle; areas of occlusion involving anterior tibial artery noted; no significant flow in dorsalis pedis artery -Has been evaluated by vascular surgery.  Intervention would require overly aggressive measures (starting in right brachial artery possibly) - vascular surgery and Dr. Sabino Gasser dicussed that palliative care is more appropriate at this time and intervention may be more harm than good at this point; patient is also starting to exhibit some signs of dementia per her granddaughter and she has been also not eating as much probably in part due to her mesenteric ischemia as well -Continue heparin drip for now while in hospital given patient associating pain relief with this -Continue oxycodone and modify as needed -Palliative care now following to assist determining disposition. Noted plan for home with hospice vs SNF  HTN -on amlodipine, losartan at home.  On 11/14/2020, blood pressure soft; meds held and blood pressure has recovered -Continued losartan on 11/15/2020; stable, presently holding off on resuming amlodipine at this time  HLD -On pravastatin currently.  Does have severe PAD however mortality benefit from statin questionable at this point; if going to pursue less aggressive measures, will probably discontinue statin at that time  Hx NSCLC  - stable; continue inhalers   Elevated troponin  - flat trend; low suspicion for ACS at this nor would be a good candidate - continue heparin drip for now     DVT prophylaxis: Heparin gtt Code Status: DNR Family Communication: Pt in room,  family is not at bedside  Status is: Inpatient  Remains inpatient appropriate because:Unsafe d/c plan   Dispo: The patient is from: Home              Anticipated d/c is to: Home              Patient currently is not  medically stable to d/c.   Difficult to place patient No       Consultants:   Palliative Care  Procedures:     Antimicrobials: Anti-infectives (From admission, onward)   None       Subjective: Confused this AM  Objective: Vitals:   11/16/20 1954 11/16/20 2244 11/17/20 0404 11/17/20 0811  BP:  (!) 128/58 (!) 166/72   Pulse: 70 72 82   Resp: 20 18 18    Temp:  99.8 F (37.7 C) 98 F (36.7 C)   TempSrc:  Axillary Axillary   SpO2: 98% 96% 98% 98%  Weight:      Height:        Intake/Output Summary (Last 24 hours) at 11/17/2020 1700 Last data filed at 11/17/2020 1500 Gross per 24 hour  Intake 1856.37 ml  Output --  Net 1856.37 ml   Filed Weights   11/10/20 0942  Weight: 67 kg    Examination: General exam: Awake, laying in bed, in nad Respiratory system: Normal respiratory effort, no wheezing Cardiovascular system: regular rate, s1, s2 Gastrointestinal system: Soft, nondistended, positive BS Central nervous system: CN2-12 grossly intact, strength intact Extremities: Perfused, no clubbing Skin: Normal skin turgor, no notable skin lesions seen Psychiatry: Mood normal // no visual hallucinations    Data Reviewed: I have personally reviewed following labs and imaging studies  CBC: Recent Labs  Lab 11/13/20 0057 11/14/20 0125 11/15/20 0220 11/16/20 0030 11/17/20 0033  WBC 4.4 5.5 5.5 6.3 5.8  HGB 11.5* 11.7* 12.5 12.0 12.4  HCT 35.0* 35.7* 38.5 36.6 37.8  MCV 94.9 95.2 96.3 95.8 93.8  PLT 245 243 275 276 035   Basic Metabolic Panel: Recent Labs  Lab 11/11/20 0538  NA 132*  K 3.9  CL 101  CO2 23  GLUCOSE 84  BUN 16  CREATININE 0.94  CALCIUM 8.3*   GFR: Estimated Creatinine Clearance: 34 mL/min (by C-G formula based on SCr of 0.94 mg/dL). Liver Function Tests: Recent Labs  Lab 11/11/20 0538  AST 20  ALT 15  ALKPHOS 34*  BILITOT 1.0  PROT 5.0*  ALBUMIN 2.4*   No results for input(s): LIPASE, AMYLASE in the last 168 hours. No  results for input(s): AMMONIA in the last 168 hours. Coagulation Profile: No results for input(s): INR, PROTIME in the last 168 hours. Cardiac Enzymes: No results for input(s): CKTOTAL, CKMB, CKMBINDEX, TROPONINI in the last 168 hours. BNP (last 3 results) No results for input(s): PROBNP in the last 8760 hours. HbA1C: No results for input(s): HGBA1C in the last 72 hours. CBG: No results for input(s): GLUCAP in the last 168 hours. Lipid Profile: No results for input(s): CHOL, HDL, LDLCALC, TRIG, CHOLHDL, LDLDIRECT in the last 72 hours. Thyroid Function Tests: No results for input(s): TSH, T4TOTAL, FREET4, T3FREE, THYROIDAB in the last 72 hours. Anemia Panel: No results for input(s): VITAMINB12, FOLATE, FERRITIN, TIBC, IRON, RETICCTPCT in the last 72 hours. Sepsis Labs: No results for input(s): PROCALCITON, LATICACIDVEN in the last 168 hours.  Recent Results (from the past 240 hour(s))  Culture, blood (routine x 2)     Status: None   Collection Time: 11/10/20  9:55 AM   Specimen: BLOOD  Result Value Ref Range Status   Specimen Description   Final    BLOOD LEFT ANTECUBITAL Performed at [facility] [address], [place], [place] [address]    Special Requests   Final    BOTTLES DRAWN AEROBIC AND ANAEROBIC Blood Culture results may not be optimal due to an inadequate volume of blood received in culture bottles Performed at [facility] [address] [place], [place] [address]    Culture   Final    NO GROWTH 5 DAYS Performed at [facility], [place] [address], [place], [place] [address]    Report Status 11/15/2020 FINAL  Final  Culture, blood (routine x 2)     Status: None   Collection Time: 11/10/20 10:27 AM   Specimen: BLOOD  Result Value Ref Range Status   Specimen Description   Final    BLOOD BLOOD RIGHT FOREARM Performed at [facility] [address] [place], [place] [address]    Special Requests   Final     BOTTLES DRAWN AEROBIC AND ANAEROBIC Blood Culture adequate volume Performed at [facility] [address] [place], [place] [address]    Culture   Final    NO GROWTH 5 DAYS Performed at [facility], [place] [address] [place], [place] [address]    Report Status 11/15/2020 FINAL  Final  Resp Panel by RT-PCR (Flu A&B, Covid) Nasopharyngeal Swab     Status: None   Collection Time: 11/10/20  1:33 PM   Specimen: Nasopharyngeal Swab; Nasopharyngeal(NP) swabs in vial transport medium  Result Value Ref Range Status   SARS Coronavirus 2 by RT PCR NEGATIVE NEGATIVE Final    Comment: (NOTE) SARS-CoV-2 target nucleic acids are NOT DETECTED.  The SARS-CoV-2 RNA is generally detectable in upper respiratory specimens during the acute phase of infection. The lowest concentration of SARS-CoV-2 viral copies this assay can detect is 138 copies/mL. A negative result does not preclude SARS-Cov-2 infection and should not be used as the sole basis for treatment or other patient management decisions. A negative result may occur with  improper specimen collection/handling, submission of specimen other than nasopharyngeal swab, presence of viral mutation(s) within the areas targeted by this assay, and inadequate number of viral copies(<138 copies/mL). A negative result must be combined with clinical observations, patient history, and epidemiological information. The expected result is Negative.  Fact Sheet for Patients:  EntrepreneurPulse.com.au  Fact Sheet for Healthcare Providers:  IncredibleEmployment.be  This test is no t yet approved or cleared by the [place] FDA and  has been authorized for detection and/or diagnosis of SARS-CoV-2 by FDA under an Emergency Use Authorization (EUA). This EUA will remain  in effect (meaning this test can be used) for the duration of the COVID-19 declaration under Section 564(b)(1) of the Act,  21 U.S.C.section 360bbb-3(b)(1), unless the authorization is terminated  or revoked sooner.       Influenza A by PCR NEGATIVE NEGATIVE Final   Influenza B by PCR NEGATIVE NEGATIVE Final    Comment: (NOTE) The Xpert Xpress SARS-CoV-2/FLU/RSV plus assay is intended as an aid in the diagnosis of influenza from Nasopharyngeal swab specimens and should not be used as a sole basis for treatment. Nasal washings and aspirates are unacceptable for Xpert Xpress SARS-CoV-2/FLU/RSV testing.  Fact Sheet for Patients: EntrepreneurPulse.com.au  Fact Sheet for Healthcare Providers: IncredibleEmployment.be  This test is not yet approved or cleared by  the [employer] and has been authorized for detection and/or diagnosis of SARS-CoV-2 by FDA under an Emergency Use Authorization (EUA). This EUA will remain in effect (meaning this test can be used) for the duration of the COVID-19 declaration under Section 564(b)(1) of the Act, 21 U.S.C. section 360bbb-3(b)(1), unless the authorization is terminated or revoked.  Performed at [facility], [place] [address], [place], [place] [address]      Radiology Studies: No results found.  Scheduled Meds: . cholecalciferol  1,000 Units Oral Daily  . cycloSPORINE  2 drop Both Eyes BID  . diclofenac Sodium  2 g Topical QID  . gabapentin  200 mg Oral TID  . latanoprost  1 drop Both Eyes QHS  . losartan  50 mg Oral Daily  . mouth rinse  15 mL Mouth Rinse BID  . mometasone-formoterol  2 puff Inhalation BID  . oxyCODONE  5 mg Oral Q6H  . pantoprazole  40 mg Oral Daily  . pravastatin  20 mg Oral QHS  . senna-docusate  1 tablet Oral BID   Continuous Infusions: . heparin 850 Units/hr (11/17/20 1500)     LOS: 5 days   Nicole Lund, MD [facility] Pager On Amion  If 7PM-7AM, please contact night-coverage 11/17/2020, 5:00 PM

## 2020-11-17 NOTE — [place] Notes (Signed)
Nutrition Follow Up  DOCUMENTATION CODES:   Non-severe (moderate) malnutrition in context of chronic illness  INTERVENTION:   -D/C Ensure as patient does not like taste  - Magic Cup BID with lunch and dinner meals, each supplement provides 290 kcal and 9 grams of protein  - Feeding assistance with meals  NUTRITION DIAGNOSIS:   Moderate Malnutrition related to chronic illness (severe PVD, COPD, dementia) as evidenced by moderate muscle depletion,moderate fat depletion.  Ongoing   GOAL:   Patient will meet greater than or equal to 90% of their needs   Not meeting  MONITOR:   PO intake,Supplement acceptance,Weight trends  REASON FOR ASSESSMENT:   Malnutrition Screening Tool    ASSESSMENT:   87 year old female who presented to the ED on 4/13 with weakness and foot pain. PMH of non-small cell lung cancer, HTN, PVD s/p right AKA, HLD, PAOD, COPD, dementia.   Patient sleeping upon follow up. RN reports patient not taking much by mouth. Does better when daughter helps her eat. Last two meal completions charted as 5%. Does not like Ensure as "it's too sweet." Per last palliative meeting family desires for patient to be kept comfortable. May need to address artifical feeding GOC if intake remains poor.   Admission weight: 67 kg (need current weight)  Medications: vitamin D, senokot  Labs: Na 132 (L)   Diet Order:   Diet Order            Diet regular Room service appropriate? Yes; Fluid consistency: Thin  Diet effective now                 EDUCATION NEEDS:   Not appropriate for education at this time  Skin:  Skin Assessment: Reviewed RN Assessment  Last BM:  4/18  Height:   Ht Readings from Last 1 Encounters:  11/10/20 5' (1.524 m)    Weight:   Wt Readings from Last 1 Encounters:  11/10/20 67 kg    BMI:  Body mass index is 28.85 kg/m.  Estimated Nutritional Needs:   Kcal:  1300-1500  Protein:  65-80 grams  Fluid:  1.3-1.5 L  Mariana Single  RD, LDN Clinical Nutrition Pager listed in [place]

## 2020-11-17 NOTE — TOC [place] Note (Signed)
Transition of Care [facility]) - [place] Note    Patient Details  Name: JAMIRAH ZELAYA MRN: [id number] Date of Birth: August 01, 1927  Transition of Care [facility]) CM/SW [place], [place] Phone Number: 11/17/2020, 4:37 PM  Clinical Narrative:     [facility] - unable to accept patient  Thurmond Butts, MSW, LCSW Clinical Social Worker   Expected Discharge Plan: [place] Barriers to Discharge: Continued Medical Work up  Expected Discharge Plan and Services Expected Discharge Plan: [place]   Discharge Planning Services: CM Consult   Living arrangements for the past 2 months: Apartment                                       Social Determinants of Health (SDOH) Interventions    Readmission Risk Interventions No flowsheet data found.

## 2020-11-17 NOTE — [place] Notes (Signed)
@  0420 Dr. Marlowe Sax, on-call for attending, text-paged regarding pt's SBPs 160s-170s. No orders received, will continue to monitor.

## 2020-11-17 NOTE — [place] Notes (Signed)
[place] for Heparin Indication: PVD, possible ischemic foot  Labs: Recent Labs    11/15/20 0220 11/16/20 0030 11/16/20 1058 11/17/20 0033  HGB 12.5 12.0  --  12.4  HCT 38.5 36.6  --  37.8  PLT 275 276  --  269  HEPARINUNFRC 0.38 0.17* 0.31 0.38    Estimated Creatinine Clearance: 34 mL/min (by C-G formula based on SCr of 0.94 mg/dL).   Assessment: 87 yo female with hx of PVD s/p amputation of RLE and L common iliac stenting admitted with L leg pain.  CT shows occlusions of the left common iliac and SFA.  Pharmacy is consulted to dose IV heparin. No procedures planned. Plans noted to continue heparin to help minimize pain -heparin level at goal, CBC stable    Goal of Therapy:  Heparin level 0.3-0.7 units/ml Monitor platelets by anticoagulation protocol: Yes   Plan:  -Continue heparin at 850 units/hr -Daily heparin level and CBC  Hildred Laser, PharmD Clinical Pharmacist **Pharmacist phone directory can now be found on [email or web address] ([email or web address]).  Listed under [place].

## 2020-11-18 LAB — CBC
HCT: 39 % (ref 36.0–46.0)
Hemoglobin: 12.7 g/dL (ref 12.0–15.0)
MCH: 30.6 pg (ref 26.0–34.0)
MCHC: 32.6 g/dL (ref 30.0–36.0)
MCV: 94 fL (ref 80.0–100.0)
Platelets: 286 10*3/uL (ref 150–400)
RBC: 4.15 MIL/uL (ref 3.87–5.11)
RDW: 13.9 % (ref 11.5–15.5)
WBC: 5 10*3/uL (ref 4.0–10.5)
nRBC: 0 % (ref 0.0–0.2)

## 2020-11-18 LAB — HEPARIN LEVEL (UNFRACTIONATED): Heparin Unfractionated: 0.24 IU/mL — ABNORMAL LOW (ref 0.30–0.70)

## 2020-11-18 MED ORDER — SODIUM CHLORIDE 0.9 % IV SOLN: INTRAVENOUS | Status: DC

## 2020-11-18 MED ORDER — QUETIAPINE FUMARATE 25 MG PO TABS
25.0000 mg | ORAL_TABLET | Freq: Every day | ORAL | Status: DC
  Administered 2020-11-18 – 2020-11-19 (×2): 25 mg via ORAL
  Filled 2020-11-18 (×2): qty 1

## 2020-11-18 NOTE — [place] Notes (Signed)
[place] for Heparin Indication: PVD, possible ischemic foot  Labs: Recent Labs    11/16/20 0030 11/16/20 1058 11/17/20 0033 11/18/20 0152  HGB 12.0  --  12.4 12.7  HCT 36.6  --  37.8 39.0  PLT 276  --  269 286  HEPARINUNFRC 0.17* 0.31 0.38 0.24*    Estimated Creatinine Clearance: 34 mL/min (by C-G formula based on SCr of 0.94 mg/dL).   Assessment: 87 yo female with hx of PVD s/p amputation of RLE and L common iliac stenting admitted with L leg pain.  CT shows occlusions of the left common iliac and SFA.  Pharmacy is consulted to dose IV heparin. No procedures planned. Plans noted to continue heparin to help minimize pain -heparin level below goal, CBC stable   Goal of Therapy:  Heparin level 0.3-0.7 units/ml Monitor platelets by anticoagulation protocol: Yes   Plan:  -Increase heparin to 950 units/hr -Daily heparin level and CBC  Hildred Laser, PharmD Clinical Pharmacist **Pharmacist phone directory can now be found on [email or web address] ([email or web address]).  Listed under [place].

## 2020-11-18 NOTE — Progress Notes (Signed)
Daily Progress Note   Patient Name: [name]       Date: 11/18/2020 DOB: 03/03/1928  Age: 87 y.o. MRN#: [id number] Attending Physician: Donne Hazel, MD Primary Care Physician: [name], MD Admit Date: 11/10/2020  Reason for Consultation/Follow-up: Establishing goals of care and Psychosocial/spiritual support  Subjective: Medical records reviewed. Assessed patient at the bedside. She appears mildly uncomfortable with a furrowed brow. Difficult to understand her speech. No family present at the bedside.  Called granddaughter [name] to provide support and further delineate goals of care and preferences for patient's discharge. During our last discussion, she was leaning towards SNF with hospice. [name] reports that she is now feeling more hopeful that home hospice will be a feasible solution. This would be preferred, as [facility] has agreed to continue oral heparin for the patient in hospice care.  [name] has received approval for increased cap of personal care hours offered by [facility] through state/DSS resources. She has excellent support while making these arrangements and communication is ongoing. She is also in the process of appealing for home assistance from [employer]. However, she would like to receive a list of SNFs in [place] "if worst comes to worst." She states it would be an honor to care for her grandmother at home but realizes she needs to prepare for all possibilities and needs. She agrees to provide update on expected timeline for these services to be finalized.   We discussed patient's pain management with Oxycodone. Counseled on the regimen's effectiveness and [name] agrees she has seen an improvement in patient's pain. She understands we will continue scheduled Oxycodone  without adjustments at this time. Addressed her request for Seroquel and educated on the risks of sedation without impacting patient's pain levels.   Questions and concerns addressed. PMT will continue to support holistically.     Length of Stay: 6  Current Medications: Scheduled Meds:  . cholecalciferol  1,000 Units Oral Daily  . cycloSPORINE  2 drop Both Eyes BID  . diclofenac Sodium  2 g Topical QID  . gabapentin  200 mg Oral TID  . latanoprost  1 drop Both Eyes QHS  . losartan  50 mg Oral Daily  . mouth rinse  15 mL Mouth Rinse BID  . mometasone-formoterol  2 puff Inhalation BID  . oxyCODONE  5 mg  Oral Q6H  . pantoprazole  40 mg Oral Daily  . pravastatin  20 mg Oral QHS  . senna-docusate  1 tablet Oral BID    Continuous Infusions: . heparin 950 Units/hr (11/18/20 1131)    PRN Meds: acetaminophen **OR** acetaminophen, albuterol, albuterol, antiseptic oral rinse, benzocaine, haloperidol lactate, meclizine, ondansetron **OR** ondansetron (ZOFRAN) IV, oxyCODONE, polyethylene glycol, tiZANidine  Physical Exam Vitals and nursing note reviewed.  Cardiovascular:     Rate and Rhythm: Normal rate.  Pulmonary:     Effort: Pulmonary effort is normal.  Musculoskeletal:     Right Lower Extremity: Right leg is amputated above knee.  Neurological:     Mental Status: She is alert. Mental status is at baseline.  Psychiatric:        Behavior: Behavior is cooperative.             Vital Signs: BP (!) 125/50 (BP Location: Left Arm)   Pulse 72   Temp 98.5 F (36.9 C) (Axillary)   Resp 15   Ht 5' (1.524 m)   Wt 67 kg   SpO2 97%   BMI 28.85 kg/m  SpO2: SpO2: 97 % O2 Device: O2 Device: Room Air O2 Flow Rate:    Intake/output summary:   Intake/Output Summary (Last 24 hours) at 11/18/2020 1206 Last data filed at 11/18/2020 0700 Gross per 24 hour  Intake 631.37 ml  Output --  Net 631.37 ml   LBM: Last BM Date: 11/15/20 Baseline Weight: Weight: 67 kg Most recent weight:  Weight: 67 kg       Palliative Assessment/Data: 30%     Patient Active Problem List   Diagnosis Date Noted  . Arterial occlusion   . PVD (peripheral vascular disease) ([place]) 11/12/2020  . Malnutrition of moderate degree 11/12/2020  . Goals of care, counseling/discussion   . Palliative care by specialist   . Altered mental status   . Ischemic foot 11/10/2020  . Cough 02/15/2016  . Bilateral sensorineural hearing loss 12/22/2015  . Sepsis ([place]) 10/26/2015  . Vertigo 08/03/2015  . Numbness-Left Heel and Toes  10/28/2014  . Sharp pain- Left Lateral Thigh and Right Stump 10/28/2014  . Cancer of lower lobe of left lung ([place]) 06/07/2013  . COPD GOLD I 04/18/2013  . Hemoptysis 04/10/2013  . Pain in joint, lower leg 03/19/2013  . Iliac artery stenosis, left ([place]) 03/13/2012  . Peripheral vascular disease, unspecified ([place]) 02/28/2012  . Chest pain 10/04/2011  . Hypertension 10/04/2011  . Hyperlipidemia 10/04/2011    Palliative Care Assessment & Plan   Patient Profile: 87 y.o. female  with past medical history of HTN, PVD, PAOD, HLD, COPD, right AKA, lung cancer s/p radiation therapy admitted on 11/10/2020 with left leg pain.   She was found to have occlusions of the left common iliac and SFA. EDP spoke with Vascular surgery (Dr. Carlis Abbott). The patient was started on heparin gtt w/ rec to be admitted to [facility]. Patient is a poor candidate for revascularization. Palliative care has been consulted to assist with determining goals of care.   Assessment: Severe PVD Dementia  LLE pain  Recommendations/Plan:  Ongoing goals of care discussions with discharge plan depending on [name]'s ability to secure enough support in the home  Home hospice is still preferred,  [facility] as second option  Discussed with Dr. Sabino Gasser and Steffanie Dunn CN regarding request for Seroquel and [name]'s interest in SNF options  Continue Oxycodone 5mg  Q6H with PRN for breakthrough pain  Counseled on  patient's risk for sedation  and delirium   Goals of Care and Additional Recommendations:  Limitations on Scope of Treatment: No Surgical Procedures  Code Status: DNR/DNI   Code Status Orders  (From admission, onward)         Start     Ordered   11/12/20 1139  Do not attempt resuscitation (DNR)  Continuous       Question Answer Comment  In the event of cardiac or respiratory ARREST Do not call a "code blue"   In the event of cardiac or respiratory ARREST Do not perform Intubation, CPR, defibrillation or ACLS   In the event of cardiac or respiratory ARREST Use medication by any route, position, wound care, and other measures to relive pain and suffering. May use oxygen, suction and manual treatment of airway obstruction as needed for comfort.      11/12/20 1139        Code Status History    Date Active Date Inactive Code Status Order ID Comments User Context   11/10/2020 1810 11/12/2020 1139 Full Code [id number]  Jonnie Finner, DO Inpatient   06/16/2013 1508 06/25/2013 1408 Full Code [id number]  Sara Chu Inpatient   04/10/2013 1116 04/11/2013 1836 Full Code [id number]  Erick Colace, NP Inpatient   Advance Care Planning Activity      Prognosis:   Poor long-term prognosis given functional and cognitive decline, poor candidacy for vascular interventions, advanced age and multiple comorbidities  Discharge Planning:  Home with Hospice  Care plan was discussed with patient's granddaughter/HCPOA [name], Dr. Sabino Gasser, Marvetta Gibbons CM  Total time: 15 minutes  Greater than 50% of this time was spent in counseling and coordinating care related to the above assessment and plan.  Dorthy Cooler, PA-C Palliative Medicine Team Team phone # [phone]  Thank you for allowing the Palliative Medicine Team to assist in the care of this patient. Please utilize secure chat with additional questions, if there is no response within 30 minutes please call the above phone  number.  Palliative Medicine Team providers are available by phone from 7am to 7pm daily and can be reached through the team cell phone.  Should this patient require assistance outside of these hours, please call the patient's attending physician.

## 2020-11-18 NOTE — Progress Notes (Signed)
PROGRESS NOTE    [name]  BHA:[id number] DOB: [birth year] DOA: 11/10/2020 PCP: [name], MD    Brief Narrative:  87 y.o.femalewith medical history significant ofHTN, PVD, PAOD, HLD. Presenting with left leg pain. Hx from granddtr at bedside. Her granddtr reports that the patient has been complaining of pain more frequently at home and having more difficulty with ambulation.  Over the past several weeks to months, her granddaughter has noticed an overall gradual decline in the patient's ability to thrive in general.  She also notes that the patient has been becoming more confused/forgetful at times (example provided was she is now forgetting to pay bills which was never an issue in the past).  She also has been more dependent recently on pain medications due to the ongoing pain, spasms, cramping in her left leg.  She is also not eating as much as previously due to pain associated with eating as well at times.  She was admitted for vascular surgery evaluation. After evaluation, her occlusive disease was considered rather remarkable and intervention would be high risk with the patient's underlying comorbidities, frailty, and overall debility.  Palliative care was recommended.   Assessment & Plan:   Active Problems:   Hypertension   Ischemic foot   PVD (peripheral vascular disease) ([facility])   Malnutrition of moderate degree   Goals of care, counseling/discussion   Palliative care by specialist   Altered mental status   Arterial occlusion  Severe PVD - patient has occluded celiac trunk with retrograde flow from SMA and PDA branches; calcified plaque at aortic bifurcation and origin of right CIA; right EIA heavily calcified and small; junction of right EIA and proximal CFA has high-grade stenosis w/ short segment occlusion; calcified plaque in right CFA; no significant flow in right SFA (and R AKA noted); Left CIA is stented and occluded; left EIA has diffuse atherosclerotic disease;  left CFA is heavily calcified but patent; left SFA occluded at origin (reconstitution of flow in popliteal artery above-knee); no significant flow in PTA; peroneal artery patent to ankle; areas of occlusion involving anterior tibial artery noted; no significant flow in dorsalis pedis artery -Has been evaluated by vascular surgery.  Intervention would require overly aggressive measures (starting in right brachial artery possibly) - vascular surgery and Dr. Sabino Gasser dicussed that palliative care is more appropriate at this time and intervention may be more harm than good at this point; patient is also starting to exhibit some signs of dementia per her granddaughter and she has been also not eating as much probably in part due to her mesenteric ischemia as well -Pt had been continued on heparin drip for now while in hospital given patient associating pain relief with this, possible transition to eliquis vs xarelto on d/c -Continue oxycodone and modify as needed -Palliative care now following to assist determining disposition. Noted plan for home with hospice vs SNF  HTN -on amlodipine, losartan at home.  On 11/14/2020, blood pressure soft; meds held and blood pressure has recovered -Continued losartan on 11/15/2020; BP remains stable at this time  HLD -On pravastatin currently.  Does have severe PAD however mortality benefit from statin questionable at this point; if going to pursue less aggressive measures  Hx NSCLC  - stable; continue inhalers   Elevated troponin  - flat trend; low suspicion for ACS at this nor would be a good candidate - continue heparin drip for now   Goals of Care -Appreciate input by Palliative Care, recs for home with hospice  noted -Pt reportedly taking in only 20-25% of meals. Will supplement with gentle IVF for now  DVT prophylaxis: Heparin gtt Code Status: DNR Family Communication: Pt in room, family is not at bedside  Status is: Inpatient  Remains inpatient  appropriate because:Unsafe d/c plan   Dispo: The patient is from: Home              Anticipated d/c is to: Home with hospice vs SNF              Patient currently is not medically stable to d/c.   Difficult to place patient No       Consultants:   Palliative Care  Procedures:     Antimicrobials: Anti-infectives (From admission, onward)   None      Subjective: Remains confused today  Objective: Vitals:   11/17/20 2015 11/17/20 2019 11/17/20 2320 11/18/20 0420  BP: (!) 90/58  (!) 103/56 (!) 125/50  Pulse: 76 76 68 72  Resp: 16 16 14 15   Temp: 98 F (36.7 C)  98.2 F (36.8 C) 98.5 F (36.9 C)  TempSrc: Axillary  Axillary Axillary  SpO2: 94% 94% 92% 97%  Weight:      Height:        Intake/Output Summary (Last 24 hours) at 11/18/2020 1537 Last data filed at 11/18/2020 0700 Gross per 24 hour  Intake 195.98 ml  Output --  Net 195.98 ml   Filed Weights   11/10/20 0942  Weight: 67 kg    Examination: General exam: Conversant, in no acute distress, mucus membranes dry Respiratory system: normal chest rise, clear, no audible wheezing Cardiovascular system: regular rhythm, s1-s2 Gastrointestinal system: Nondistended, nontender, pos BS Central nervous system: No seizures, no tremors Extremities: No cyanosis, no joint deformities Skin: No rashes, no pallor Psychiatry: Affect normal // no auditory hallucinations   Data Reviewed: I have personally reviewed following labs and imaging studies  CBC: Recent Labs  Lab 11/14/20 0125 11/15/20 0220 11/16/20 0030 11/17/20 0033 11/18/20 0152  WBC 5.5 5.5 6.3 5.8 5.0  HGB 11.7* 12.5 12.0 12.4 12.7  HCT 35.7* 38.5 36.6 37.8 39.0  MCV 95.2 96.3 95.8 93.8 94.0  PLT 243 275 276 269 169   Basic Metabolic Panel: No results for input(s): NA, K, CL, CO2, GLUCOSE, BUN, CREATININE, CALCIUM, MG, PHOS in the last 168 hours. GFR: Estimated Creatinine Clearance: 34 mL/min (by C-G formula based on SCr of 0.94 mg/dL). Liver  Function Tests: No results for input(s): AST, ALT, ALKPHOS, BILITOT, PROT, ALBUMIN in the last 168 hours. No results for input(s): LIPASE, AMYLASE in the last 168 hours. No results for input(s): AMMONIA in the last 168 hours. Coagulation Profile: No results for input(s): INR, PROTIME in the last 168 hours. Cardiac Enzymes: No results for input(s): CKTOTAL, CKMB, CKMBINDEX, TROPONINI in the last 168 hours. BNP (last 3 results) No results for input(s): PROBNP in the last 8760 hours. HbA1C: No results for input(s): HGBA1C in the last 72 hours. CBG: No results for input(s): GLUCAP in the last 168 hours. Lipid Profile: No results for input(s): CHOL, HDL, LDLCALC, TRIG, CHOLHDL, LDLDIRECT in the last 72 hours. Thyroid Function Tests: No results for input(s): TSH, T4TOTAL, FREET4, T3FREE, THYROIDAB in the last 72 hours. Anemia Panel: No results for input(s): VITAMINB12, FOLATE, FERRITIN, TIBC, IRON, RETICCTPCT in the last 72 hours. Sepsis Labs: No results for input(s): PROCALCITON, LATICACIDVEN in the last 168 hours.  Recent Results (from the past 240 hour(s))  Culture, blood (routine x 2)  Status: None   Collection Time: 11/10/20  9:55 AM   Specimen: BLOOD  Result Value Ref Range Status   Specimen Description   Final    BLOOD LEFT ANTECUBITAL Performed at [facility] [address], [place], [place] [address]    Special Requests   Final    BOTTLES DRAWN AEROBIC AND ANAEROBIC Blood Culture results may not be optimal due to an inadequate volume of blood received in culture bottles Performed at [facility] [address] [place], [place] [address]    Culture   Final    NO GROWTH 5 DAYS Performed at [facility], [place] [address], [place], [place] [address]    Report Status 11/15/2020 FINAL  Final  Culture, blood (routine x 2)     Status: None   Collection Time: 11/10/20 10:27 AM   Specimen: BLOOD  Result Value Ref Range Status    Specimen Description   Final    BLOOD BLOOD RIGHT FOREARM Performed at [facility] [address] [place], [place] [address]    Special Requests   Final    BOTTLES DRAWN AEROBIC AND ANAEROBIC Blood Culture adequate volume Performed at [facility] [address], [place], [place] [address]    Culture   Final    NO GROWTH 5 DAYS Performed at [facility], [place] [address] [place], [place] [address]    Report Status 11/15/2020 FINAL  Final  Resp Panel by RT-PCR (Flu A&B, Covid) Nasopharyngeal Swab     Status: None   Collection Time: 11/10/20  1:33 PM   Specimen: Nasopharyngeal Swab; Nasopharyngeal(NP) swabs in vial transport medium  Result Value Ref Range Status   SARS Coronavirus 2 by RT PCR NEGATIVE NEGATIVE Final    Comment: (NOTE) SARS-CoV-2 target nucleic acids are NOT DETECTED.  The SARS-CoV-2 RNA is generally detectable in upper respiratory specimens during the acute phase of infection. The lowest concentration of SARS-CoV-2 viral copies this assay can detect is 138 copies/mL. A negative result does not preclude SARS-Cov-2 infection and should not be used as the sole basis for treatment or other patient management decisions. A negative result may occur with  improper specimen collection/handling, submission of specimen other than nasopharyngeal swab, presence of viral mutation(s) within the areas targeted by this assay, and inadequate number of viral copies(<138 copies/mL). A negative result must be combined with clinical observations, patient history, and epidemiological information. The expected result is Negative.  Fact Sheet for Patients:  EntrepreneurPulse.com.au  Fact Sheet for Healthcare Providers:  IncredibleEmployment.be  This test is no t yet approved or cleared by the [place] FDA and  has been authorized for detection and/or diagnosis of SARS-CoV-2 by FDA under an  Emergency Use Authorization (EUA). This EUA will remain  in effect (meaning this test can be used) for the duration of the COVID-19 declaration under Section 564(b)(1) of the Act, 21 U.S.C.section 360bbb-3(b)(1), unless the authorization is terminated  or revoked sooner.       Influenza A by PCR NEGATIVE NEGATIVE Final   Influenza B by PCR NEGATIVE NEGATIVE Final    Comment: (NOTE) The Xpert Xpress SARS-CoV-2/FLU/RSV plus assay is intended as an aid in the diagnosis of influenza from Nasopharyngeal swab specimens and should not be used as a sole basis for treatment. Nasal washings and aspirates are unacceptable for Xpert Xpress SARS-CoV-2/FLU/RSV testing.  Fact Sheet for Patients: EntrepreneurPulse.com.au  Fact Sheet for Healthcare Providers: IncredibleEmployment.be  This  test is not yet approved or cleared by the [place] and has been authorized for detection and/or diagnosis of SARS-CoV-2 by FDA under an Emergency Use Authorization (EUA). This EUA will remain in effect (meaning this test can be used) for the duration of the COVID-19 declaration under Section 564(b)(1) of the Act, 21 U.S.C. section 360bbb-3(b)(1), unless the authorization is terminated or revoked.  Performed at [facility], [place] [address] [place], [place] [address]      Radiology Studies: No results found.  Scheduled Meds: . cholecalciferol  1,000 Units Oral Daily  . cycloSPORINE  2 drop Both Eyes BID  . diclofenac Sodium  2 g Topical QID  . gabapentin  200 mg Oral TID  . latanoprost  1 drop Both Eyes QHS  . losartan  50 mg Oral Daily  . mouth rinse  15 mL Mouth Rinse BID  . mometasone-formoterol  2 puff Inhalation BID  . oxyCODONE  5 mg Oral Q6H  . pantoprazole  40 mg Oral Daily  . pravastatin  20 mg Oral QHS  . senna-docusate  1 tablet Oral BID   Continuous Infusions: . sodium chloride    . heparin 950 Units/hr (11/18/20 1131)      LOS: 6 days   Marylu Lund, MD [facility] Pager On Amion  If 7PM-7AM, please contact night-coverage 11/18/2020, 3:37 PM

## 2020-11-18 NOTE — TOC Benefit Eligibility Note (Signed)
Transition of Care [facility]) Benefit Eligibility Note    Patient Details  Name: [name] MRN: [id number] Date of Birth: 05-06-1928   Medication/Dose: ELIQUIS  5 MG BID -CO-PAY- ZERO DOLLARS   and  XARELTO 20 MG DAILY -[place]  Covered?: Yes  Tier:  (NO TIER)  Prescription Coverage Preferred Pharmacy: [facility] with Person/Company/Phone Number:: LAFAYETT  @   [employer] #  [phone]  Co-Pay: [place] [employer]  Prior Approval: No  Deductible:  (NO DEDUCTIBLE WITH PLAN)  Additional Notes: SECONDARY INS : MEDICAID [place] ACCESS  EFF-DATE: 03-31-2017   CO-PAY- $ 4.00 FOR EACH PRESCRIPTION    Memory Argue Phone Number: 11/18/2020, 4:00 PM

## 2020-11-18 NOTE — Progress Notes (Signed)
Daily Progress Note   Patient Name: [name]       Date: 11/18/2020 DOB: July 25, 1928  Age: 87 y.o. MRN#: [id number] Attending Physician: Donne Hazel, MD Primary Care Physician: [name], MD Admit Date: 11/10/2020  Reason for Consultation/Follow-up: Establishing goals of care and Psychosocial/spiritual support  Subjective: Medical records reviewed. Assessed patient at the bedside. She is resting comfortably and in no acute distress. Patient's granddaughter [name] is at the bedside.  [name] expresses that patient received haldol around 36 hours ago and has remained very sleepy. We revisited her interest in Seroquel and discussed the high risk of sedation. She understands and bargains for occasional doses to see if this could be helpful, as family members report benefit in treating dementia-associated agitation. Offered to discuss with patient's attending provider.  Provided an update on patient's Oxycodone and Gabapentin doses/frequency. [name] reports she has difficulty remembering each adjustment, as she is overwhelmed and grieving. Emotional support and therapeutic listening was provided. [name] continues to feel guilt and anxiety, as she has come to realize she does not want to be present when the patient passes away. She is also understanding that the patient's time is more limited than she expected.  The natural disease trajectory and expectations at EOL were discussed. Educated on sundowning, the body's natural response to dehydration and decreased needs for nutrition. [name] becomes tearful and emotional. Provided with encouragement and Gone From My Sight Booklet for further insight.  Discussed [name]'s plans for arrangements to discharge home with hospice. She continues to express  her goal of securing assistance at home, with SNF as a back up option. She is concerned about making the best possible decision for her grandmother. She restates her willingness to update this PA when she finds out a timeline for expected services to be available.   Questions and concerns addressed. PMT will continue to support holistically.    Length of Stay: 6  Current Medications: Scheduled Meds:  . cholecalciferol  1,000 Units Oral Daily  . cycloSPORINE  2 drop Both Eyes BID  . diclofenac Sodium  2 g Topical QID  . gabapentin  200 mg Oral TID  . latanoprost  1 drop Both Eyes QHS  . losartan  50 mg Oral Daily  . mouth rinse  15 mL Mouth Rinse BID  . mometasone-formoterol  2 puff Inhalation BID  . oxyCODONE  5 mg Oral Q6H  . pantoprazole  40 mg Oral Daily  . pravastatin  20 mg Oral QHS  . senna-docusate  1 tablet Oral BID    Continuous Infusions: . sodium chloride    . heparin 950 Units/hr (11/18/20 1131)    PRN Meds: acetaminophen **OR** acetaminophen, albuterol, albuterol, antiseptic oral rinse, benzocaine, haloperidol lactate, meclizine, ondansetron **OR** ondansetron (ZOFRAN) IV, oxyCODONE, polyethylene glycol, tiZANidine  Physical Exam Vitals and nursing note reviewed.  Cardiovascular:     Rate and Rhythm: Normal rate.  Pulmonary:     Effort: Pulmonary effort is normal.  Musculoskeletal:     Right Lower Extremity: Right leg is amputated above knee.  Neurological:     Mental Status: Mental status is at baseline. She is confused.  Psychiatric:        Behavior: Behavior is cooperative.             Vital Signs: BP (!) 125/50 (BP Location: Left Arm)   Pulse 72   Temp 98.5 F (36.9 C) (Axillary)   Resp 15   Ht 5' (1.524 m)   Wt 67 kg   SpO2 97%   BMI 28.85 kg/m  SpO2: SpO2: 97 % O2 Device: O2 Device: Room Air O2 Flow Rate:    Intake/output summary:   Intake/Output Summary (Last 24 hours) at 11/18/2020 1520 Last data filed at 11/18/2020 0700 Gross per 24  hour  Intake 195.98 ml  Output --  Net 195.98 ml   LBM: Last BM Date: 11/15/20 Baseline Weight: Weight: 67 kg Most recent weight: Weight: 67 kg       Palliative Assessment/Data: 30%     Patient Active Problem List   Diagnosis Date Noted  . Arterial occlusion   . PVD (peripheral vascular disease) ([place]) 11/12/2020  . Malnutrition of moderate degree 11/12/2020  . Goals of care, counseling/discussion   . Palliative care by specialist   . Altered mental status   . Ischemic foot 11/10/2020  . Cough 02/15/2016  . Bilateral sensorineural hearing loss 12/22/2015  . Sepsis ([place]) 10/26/2015  . Vertigo 08/03/2015  . Numbness-Left Heel and Toes  10/28/2014  . Sharp pain- Left Lateral Thigh and Right Stump 10/28/2014  . Cancer of lower lobe of left lung ([place]) 06/07/2013  . COPD GOLD I 04/18/2013  . Hemoptysis 04/10/2013  . Pain in joint, lower leg 03/19/2013  . Iliac artery stenosis, left ([place]) 03/13/2012  . Peripheral vascular disease, unspecified ([name]) 02/28/2012  . Chest pain 10/04/2011  . Hypertension 10/04/2011  . Hyperlipidemia 10/04/2011    Palliative Care Assessment & Plan   Patient Profile: 87 y.o. female  with past medical history of HTN, PVD, PAOD, HLD, COPD, right AKA, lung cancer s/p radiation therapy admitted on 11/10/2020 with left leg pain.   She was found to have occlusions of the left common iliac and SFA. EDP spoke with Vascular surgery (Dr. Carlis Abbott). The patient was started on heparin gtt w/ rec to be admitted to [facility]. Patient is a poor candidate for revascularization. Palliative care has been consulted to assist with determining goals of care.   Assessment: Severe PVD Altered mental status, likely dementia LLE pain Ischemic foot (left)  Recommendations/Plan:  Psychosocial and emotional support provided  Updated [name] on patient's current pain management regimen  Educated on EOL expectations, causes for patient's sleepiness, decreased needs for  nutrition and hydration   Will again discuss Seroquel with attending Dr. Wyline Copas at granddaughter's request  Discussed with CM Kristi, goal continues to be home with hospice at undetermined time  Ongoing support and encouragement while family is faced with difficult decision making  Goals of Care and Additional Recommendations:  Limitations on Scope of Treatment: No Surgical Procedures  Code Status: DNR/DNI   Code Status Orders  (From admission, onward)         Start     Ordered   11/12/20 1139  Do not attempt resuscitation (DNR)  Continuous       Question Answer Comment  In the event of cardiac or respiratory ARREST Do not call a "code blue"   In the event of cardiac or respiratory ARREST Do not perform Intubation, CPR, defibrillation or ACLS   In the event of cardiac or respiratory ARREST Use medication by any route, position, wound care, and other measures to relive pain and suffering. May use oxygen, suction and manual treatment of airway obstruction as needed for comfort.      11/12/20 1139        Code Status History    Date Active Date Inactive Code Status Order ID Comments User Context   11/10/2020 1810 11/12/2020 1139 Full Code [id number]  Jonnie Finner, DO Inpatient   06/16/2013 1508 06/25/2013 1408 Full Code [id number]  Sara Chu Inpatient   04/10/2013 1116 04/11/2013 1836 Full Code [id number]  Erick Colace, NP Inpatient   Advance Care Planning Activity      Prognosis:   Poor long-term prognosis given functional and cognitive decline, poor candidacy for vascular interventions, advanced age and multiple comorbidities  Discharge Planning:  Home with Hospice   Total time: 25 minutes  Greater than 50% of this time was spent in counseling and coordinating care related to the above assessment and plan.  Dorthy Cooler, PA-C Palliative Medicine Team Team phone # [phone]  Thank you for allowing the Palliative Medicine Team to assist in the care  of this patient. Please utilize secure chat with additional questions, if there is no response within 30 minutes please call the above phone number.  Palliative Medicine Team providers are available by phone from 7am to 7pm daily and can be reached through the team cell phone.  Should this patient require assistance outside of these hours, please call the patient's attending physician.

## 2020-11-18 NOTE — TOC Progression Note (Signed)
Transition of Care [facility]) - Progression Note    Patient Details  Name: NOHEA KRAS MRN: [id number] Date of Birth: 1928-06-03  Transition of Care [facility]) CM/SW [place], [place] Phone Number: 11/18/2020, 4:18 PM  Clinical Narrative:     Patient at this time has no bed offers.  Thurmond Butts, MSW, LCSW Clinical Social Worker   Expected Discharge Plan: [place] Barriers to Discharge: Continued Medical Work up  Expected Discharge Plan and Services Expected Discharge Plan: [place]   Discharge Planning Services: CM Consult   Living arrangements for the past 2 months: Apartment                                       Social Determinants of Health (SDOH) Interventions    Readmission Risk Interventions No flowsheet data found.

## 2020-11-18 NOTE — TOC Progression Note (Signed)
Transition of Care (TOC) - Progression Note  Marvetta Gibbons RN, BSN Transitions of Care Unit 4E- RN Case Manager See Treatment Team for direct phone #    Patient Details  Name: [name] MRN: [id number] Date of Birth: 1927-10-21  Transition of Care [facility]) CM/SW Contact  [name], Nicole Rabon, RN Phone Number: 11/18/2020, 4:25 PM  Clinical Narrative:    [facility] RNCM and CSW attempted to speak with granddaughter at the bedside- however she had left the hospital, call made to granddaughter to try and discuss transition of care needs and plans. Granddaughter-[name] requested that she be given time to "process" things that she had discussed with PC- and stated that she needed a couple of hours "to get herself together". Agreed on a time for a return call between 2:30 and 3:00pm this afternoon.   1515- no return call from granddaughter as of 3pm- call made back to grand-daughter as agreed during previous conversation- grand daughter did answer phone- however now stating she is in a meeting and needs further time- requesting 30 min- and she will return call to the [facility] team.   1625- no return call from grand-daughter as agreed for 30 min return call- attempted to reach granddaughter again- phone call went to [place] left asking for return call to discuss transition plan. As per MD today- pt is medically stable for transition home with Hospice vs SNF pending grand-daughter choice. Have also reached out to [place] with [facility] who also still confirms that they are on board for Home Hospice if grand-daughter chooses to take pt home with hospice- per [name] he last spoke with [name] first of the week.  Await return call from Grand-daughter to confirm plan for transition, Benefits check submitted for [facility].    Expected Discharge Plan: [place] Barriers to Discharge: Continued Medical Work up  Expected Discharge Plan and Services Expected Discharge Plan: [place]    Discharge Planning Services: CM Consult   Living arrangements for the past 2 months: Apartment                                       Social Determinants of Health (SDOH) Interventions    Readmission Risk Interventions No flowsheet data found.

## 2020-11-19 LAB — CBC
HCT: 37.3 % (ref 36.0–46.0)
Hemoglobin: 12.1 g/dL (ref 12.0–15.0)
MCH: 30.7 pg (ref 26.0–34.0)
MCHC: 32.4 g/dL (ref 30.0–36.0)
MCV: 94.7 fL (ref 80.0–100.0)
Platelets: 290 10*3/uL (ref 150–400)
RBC: 3.94 MIL/uL (ref 3.87–5.11)
RDW: 13.9 % (ref 11.5–15.5)
WBC: 5.2 10*3/uL (ref 4.0–10.5)
nRBC: 0 % (ref 0.0–0.2)

## 2020-11-19 LAB — BASIC METABOLIC PANEL
Anion gap: 8 (ref 5–15)
BUN: 18 mg/dL (ref 8–23)
CO2: 22 mmol/L (ref 22–32)
Calcium: 8.3 mg/dL — ABNORMAL LOW (ref 8.9–10.3)
Chloride: 103 mmol/L (ref 98–111)
Creatinine, Ser: 1.14 mg/dL — ABNORMAL HIGH (ref 0.44–1.00)
GFR, Estimated: 46 mL/min — ABNORMAL LOW (ref >60)
Glucose, Bld: 95 mg/dL (ref 70–99)
Potassium: 4.3 mmol/L (ref 3.5–5.1)
Sodium: 133 mmol/L — ABNORMAL LOW (ref 135–145)

## 2020-11-19 LAB — HEPARIN LEVEL (UNFRACTIONATED): Heparin Unfractionated: 0.6 IU/mL (ref 0.30–0.70)

## 2020-11-19 NOTE — TOC Progression Note (Signed)
Transition of Care [facility]) - Progression Note    Patient Details  Name: [name] MRN: [id number] Date of Birth: [birth year]  Transition of Care [facility]) CM/SW [place], [place] Phone Number: 11/19/2020, 4:16 PM  Clinical Narrative:     Octavia Bruckner with [facility]ospice- secured placement with [facility]  CSW contacted [facility]- they confirmed conversation w/TIM  And confirmed they could admit the patient tomorrow- no covid test is needed. SNF requested weekend CSW follow up tomorrow morning to confirm plans.   TOC will continue to follow and assist with discharge planing.  Thurmond Butts, MSW, LCSW Clinical Social Worker   Expected Discharge Plan: [place] Barriers to Discharge: Continued Medical Work up  Expected Discharge Plan and Services Expected Discharge Plan: [place]   Discharge Planning Services: CM Consult   Living arrangements for the past 2 months: Apartment                                       Social Determinants of Health (SDOH) Interventions    Readmission Risk Interventions No flowsheet data found.

## 2020-11-19 NOTE — TOC Progression Note (Signed)
Transition of Care (TOC) - Progression Note  Marvetta Gibbons RN, BSN Transitions of Care Unit 4E- RN Case Manager See Treatment Team for direct phone #    Patient Details  Name: [name] MRN: [id number] Date of Birth: October 01, 1927  Transition [facility]) CM/SW Contact  Dahlia Client, Romeo Rabon, RN Phone Number: 11/19/2020, 10:28 PM  Clinical Narrative:    Granddaughter returned call with VM that she is on her way to hospital, this CM went to room and grand-daughter present. Attempted to discuss transition plan with grand-daughter to confirm plan to go home with hospice however grand-daughter now asking about SNF search in [place]. Explained that no SNF search has been done in [place] as she had not requested one. She is now saying that her primary plan is SNF and would like to look in [place].   1600- received call from Esther Hardy at [facility] that he had spoken with [name] and can offer a SNF bed at [facility]. Grand-daughter is agreeable to this plan. [facility] would f/u with Hospice needs once pt gets to SNF. CSW has already faxed out to [facility] and will f/u with them.  Pt will need GOLD DNR for transport.   Have notified MD of current plan to d/c tomorrow to [facility] SN[facility] with f/u by [facility].    Expected Discharge Plan: [place] Barriers to Discharge: Continued Medical Work up  Expected Discharge Plan and Services Expected Discharge Plan: [place]   Discharge Planning Services: CM Consult   Living arrangements for the past 2 months: Apartment                                       Social Determinants of Health (SDOH) Interventions    Readmission Risk Interventions No flowsheet data found.

## 2020-11-19 NOTE — Progress Notes (Signed)
PROGRESS NOTE    [name]  OIN:[id number] DOB: 1928-03-14 DOA: 11/10/2020 PCP: [name], MD    Brief Narrative:  87 y.o.femalewith medical history significant ofHTN, PVD, PAOD, HLD. Presenting with left leg pain. Hx from granddtr at bedside. Her granddtr reports that the patient has been complaining of pain more frequently at home and having more difficulty with ambulation.  Over the past several weeks to months, her granddaughter has noticed an overall gradual decline in the patient's ability to thrive in general.  She also notes that the patient has been becoming more confused/forgetful at times (example provided was she is now forgetting to pay bills which was never an issue in the past).  She also has been more dependent recently on pain medications due to the ongoing pain, spasms, cramping in her left leg.  She is also not eating as much as previously due to pain associated with eating as well at times.  She was admitted for vascular surgery evaluation. After evaluation, her occlusive disease was considered rather remarkable and intervention would be high risk with the patient's underlying comorbidities, frailty, and overall debility.  Palliative care was recommended.   Assessment & Plan:   Active Problems:   Hypertension   Ischemic foot   PVD (peripheral vascular disease) (HCC)   Malnutrition of moderate degree   Goals of care, counseling/discussion   Palliative care by specialist   Altered mental status   Arterial occlusion  Severe PVD - patient has occluded celiac trunk with retrograde flow from SMA and PDA branches; calcified plaque at aortic bifurcation and origin of right CIA; right EIA heavily calcified and small; junction of right EIA and proximal CFA has high-grade stenosis w/ short segment occlusion; calcified plaque in right CFA; no significant flow in right SFA (and R AKA noted); Left CIA is stented and occluded; left EIA has diffuse atherosclerotic disease;  left CFA is heavily calcified but patent; left SFA occluded at origin (reconstitution of flow in popliteal artery above-knee); no significant flow in PTA; peroneal artery patent to ankle; areas of occlusion involving anterior tibial artery noted; no significant flow in dorsalis pedis artery -Has been evaluated by vascular surgery.  Intervention would require overly aggressive measures (starting in right brachial artery possibly) - vascular surgery and Dr. Sabino Gasser dicussed that palliative care is more appropriate at this time and intervention may be more harm than good at this point; patient is also starting to exhibit some signs of dementia per her granddaughter and she has been also not eating as much probably in part due to her mesenteric ischemia as well -Pt had been continued on heparin drip for now while in hospital given patient associating pain relief with this, possible transition to eliquis vs xarelto on d/c -Continue oxycodone and modify as needed -Palliative care now following. Appreciate assistance. Discussed with TOC. Plan for SNF  HTN -on amlodipine, losartan at home.  On 11/14/2020, blood pressure soft; meds held and blood pressure has recovered -Continued losartan on 11/15/2020; BP remains stable at this time  HLD -On pravastatin currently.  Does have severe PAD however mortality benefit from statin questionable at this point; if going to pursue less aggressive measures  Hx NSCLC  - stable; continue inhalers   Elevated troponin  - flat trend; low suspicion for ACS at this nor would be a good candidate - continue heparin drip for now   Goals of Care -Appreciate input by Palliative Care, recs for home with hospice noted -Pt reportedly taking  in only 20-25% of meals. Have supplemented with gentle IVF for now  DVT prophylaxis: Heparin gtt Code Status: DNR Family Communication: Pt in room, family is not at bedside  Status is: Inpatient  Remains inpatient appropriate  because:Unsafe d/c plan   Dispo: The patient is from: Home              Anticipated d/c is to: SNF               Patient currently is not medically stable to d/c.   Difficult to place patient No    Consultants:   Palliative Care  Procedures:     Antimicrobials: Anti-infectives (From admission, onward)   None      Subjective: Without complaints this AM  Objective: Vitals:   11/19/20 0738 11/19/20 0802 11/19/20 1050 11/19/20 1612  BP: (!) 127/96  134/68 104/76  Pulse: 79  79 83  Resp: 20  20 20   Temp: 97.7 F (36.5 C)  98.4 F (36.9 C) 97.6 F (36.4 C)  TempSrc: Oral  Oral Axillary  SpO2: 99% 97% 100% 97%  Weight:      Height:        Intake/Output Summary (Last 24 hours) at 11/19/2020 1713 Last data filed at 11/19/2020 0605 Gross per 24 hour  Intake 234 ml  Output 700 ml  Net -466 ml   Filed Weights   11/10/20 0942  Weight: 67 kg    Examination: General exam: Conversant, in no acute distress Respiratory system: normal chest rise, clear, no audible wheezing Cardiovascular system: regular rhythm, s1-s2 Gastrointestinal system: Nondistended, nontender, pos BS Central nervous system: No seizures, no tremors Extremities: No cyanosis, no joint deformities Skin: No rashes, no pallor Psychiatry: Affect normal // no auditory hallucinations   Data Reviewed: I have personally reviewed following labs and imaging studies  CBC: Recent Labs  Lab 11/15/20 0220 11/16/20 0030 11/17/20 0033 11/18/20 0152 11/19/20 0003  WBC 5.5 6.3 5.8 5.0 5.2  HGB 12.5 12.0 12.4 12.7 12.1  HCT 38.5 36.6 37.8 39.0 37.3  MCV 96.3 95.8 93.8 94.0 94.7  PLT 275 276 269 286 160   Basic Metabolic Panel: Recent Labs  Lab 11/19/20 0003  NA 133*  K 4.3  CL 103  CO2 22  GLUCOSE 95  BUN 18  CREATININE 1.14*  CALCIUM 8.3*   GFR: Estimated Creatinine Clearance: 28 mL/min (A) (by C-G formula based on SCr of 1.14 mg/dL (H)). Liver Function Tests: No results for input(s): AST,  ALT, ALKPHOS, BILITOT, PROT, ALBUMIN in the last 168 hours. No results for input(s): LIPASE, AMYLASE in the last 168 hours. No results for input(s): AMMONIA in the last 168 hours. Coagulation Profile: No results for input(s): INR, PROTIME in the last 168 hours. Cardiac Enzymes: No results for input(s): CKTOTAL, CKMB, CKMBINDEX, TROPONINI in the last 168 hours. BNP (last 3 results) No results for input(s): PROBNP in the last 8760 hours. HbA1C: No results for input(s): HGBA1C in the last 72 hours. CBG: No results for input(s): GLUCAP in the last 168 hours. Lipid Profile: No results for input(s): CHOL, HDL, LDLCALC, TRIG, CHOLHDL, LDLDIRECT in the last 72 hours. Thyroid Function Tests: No results for input(s): TSH, T4TOTAL, FREET4, T3FREE, THYROIDAB in the last 72 hours. Anemia Panel: No results for input(s): VITAMINB12, FOLATE, FERRITIN, TIBC, IRON, RETICCTPCT in the last 72 hours. Sepsis Labs: No results for input(s): PROCALCITON, LATICACIDVEN in the last 168 hours.  Recent Results (from the past 240 hour(s))  Culture, blood (routine x  2)     Status: None   Collection Time: 11/10/20  9:55 AM   Specimen: BLOOD  Result Value Ref Range Status   Specimen Description   Final    BLOOD LEFT ANTECUBITAL Performed at [facility] [address], [place], [place] [address]    Special Requests   Final    BOTTLES DRAWN AEROBIC AND ANAEROBIC Blood Culture results may not be optimal due to an inadequate volume of blood received in culture bottles Performed at [facility] [address], [place], [place] [address]    Culture   Final    NO GROWTH 5 DAYS Performed at [facility], [place] [address], [place], [place] [address]    Report Status 11/15/2020 FINAL  Final  Culture, blood (routine x 2)     Status: None   Collection Time: 11/10/20 10:27 AM   Specimen: BLOOD  Result Value Ref Range Status   Specimen Description   Final    BLOOD BLOOD  RIGHT FOREARM Performed at [facility] [address], [place], [place] [address]    Special Requests   Final    BOTTLES DRAWN AEROBIC AND ANAEROBIC Blood Culture adequate volume Performed at [facility] [address] [place], [place] [address]    Culture   Final    NO GROWTH 5 DAYS Performed at [facility], [place] [address], [place], [place] [address]    Report Status 11/15/2020 FINAL  Final  Resp Panel by RT-PCR (Flu A&B, Covid) Nasopharyngeal Swab     Status: None   Collection Time: 11/10/20  1:33 PM   Specimen: Nasopharyngeal Swab; Nasopharyngeal(NP) swabs in vial transport medium  Result Value Ref Range Status   SARS Coronavirus 2 by RT PCR NEGATIVE NEGATIVE Final    Comment: (NOTE) SARS-CoV-2 target nucleic acids are NOT DETECTED.  The SARS-CoV-2 RNA is generally detectable in upper respiratory specimens during the acute phase of infection. The lowest concentration of SARS-CoV-2 viral copies this assay can detect is 138 copies/mL. A negative result does not preclude SARS-Cov-2 infection and should not be used as the sole basis for treatment or other patient management decisions. A negative result may occur with  improper specimen collection/handling, submission of specimen other than nasopharyngeal swab, presence of viral mutation(s) within the areas targeted by this assay, and inadequate number of viral copies(<138 copies/mL). A negative result must be combined with clinical observations, patient history, and epidemiological information. The expected result is Negative.  Fact Sheet for Patients:  EntrepreneurPulse.com.au  Fact Sheet for Healthcare Providers:  IncredibleEmployment.be  This test is no t yet approved or cleared by the [place] FDA and  has been authorized for detection and/or diagnosis of SARS-CoV-2 by FDA under an Emergency Use Authorization (EUA). This EUA will  remain  in effect (meaning this test can be used) for the duration of the COVID-19 declaration under Section 564(b)(1) of the Act, 21 U.S.C.section 360bbb-3(b)(1), unless the authorization is terminated  or revoked sooner.       Influenza A by PCR NEGATIVE NEGATIVE Final   Influenza B by PCR NEGATIVE NEGATIVE Final    Comment: (NOTE) The Xpert Xpress SARS-CoV-2/FLU/RSV plus assay is intended as an aid in the diagnosis of influenza from Nasopharyngeal swab specimens and should not be used as a sole basis for treatment. Nasal washings and aspirates are unacceptable for Xpert Xpress SARS-CoV-2/FLU/RSV testing.  Fact Sheet for Patients: EntrepreneurPulse.com.au  Fact Sheet for  Healthcare Providers: IncredibleEmployment.be  This test is not yet approved or cleared by the [place] and has been authorized for detection and/or diagnosis of SARS-CoV-2 by FDA under an Emergency Use Authorization (EUA). This EUA will remain in effect (meaning this test can be used) for the duration of the COVID-19 declaration under Section 564(b)(1) of the Act, 21 U.S.C. section 360bbb-3(b)(1), unless the authorization is terminated or revoked.  Performed at [facility] [place] [address], [place], [place] [address]      Radiology Studies: No results found.  Scheduled Meds: . cholecalciferol  1,000 Units Oral Daily  . cycloSPORINE  2 drop Both Eyes BID  . diclofenac Sodium  2 g Topical QID  . gabapentin  200 mg Oral TID  . latanoprost  1 drop Both Eyes QHS  . losartan  50 mg Oral Daily  . mouth rinse  15 mL Mouth Rinse BID  . mometasone-formoterol  2 puff Inhalation BID  . oxyCODONE  5 mg Oral Q6H  . pantoprazole  40 mg Oral Daily  . pravastatin  20 mg Oral QHS  . QUEtiapine  25 mg Oral QHS  . senna-docusate  1 tablet Oral BID   Continuous Infusions: . sodium chloride 75 mL/hr at 11/19/20 0600  . heparin 950 Units/hr (11/18/20  1610)     LOS: 7 days   Marylu Lund, MD [facility] Pager On Amion  If 7PM-7AM, please contact night-coverage 11/19/2020, 5:13 PM

## 2020-11-19 NOTE — Care Management Important Message (Signed)
Important Message  Patient Details  Name: BRIANNA BENNETT MRN: [id number] Date of Birth: [birth year]   Medicare Important Message Given:  Yes     Shelda Altes 11/19/2020, 10:47 AM

## 2020-11-19 NOTE — Progress Notes (Signed)
[facility] for Heparin Indication: PVD, possible ischemic foot  Labs: Recent Labs    11/17/20 0033 11/18/20 0152 11/19/20 0003  HGB 12.4 12.7 12.1  HCT 37.8 39.0 37.3  PLT 269 286 290  HEPARINUNFRC 0.38 0.24* 0.60  CREATININE  --   --  1.14*    Estimated Creatinine Clearance: 28 mL/min (A) (by C-G formula based on SCr of 1.14 mg/dL (H)).   Assessment: 87 yo female with hx of PVD s/p amputation of RLE and L common iliac stenting admitted with L leg pain.  CT shows occlusions of the left common iliac and SFA.  Pharmacy is consulted to dose IV heparin. No procedures planned. Plans noted to continue heparin  -heparin level below goal, CBC stable  If oral anticoagulation considered then Xarelto 2.5mg  po bid may be suitable as this is the dosing use in PAD trials (COMPASS and VOYAGER). She was on plavix PTA (allergy noted to aspirin) but If apixaban is added would not continue plavix at discharge due to bleeding risk.   Goal of Therapy:  Heparin level 0.3-0.7 units/ml Monitor platelets by anticoagulation protocol: Yes   Plan:  -Continue heparin at 950 units/hr -Daily heparin level and CBC  Hildred Laser, PharmD Clinical Pharmacist **Pharmacist phone directory can now be found on [email or web address] ([email or web address]).  Listed under [place].

## 2020-11-20 LAB — CBC
HCT: 44.2 % (ref 36.0–46.0)
Hemoglobin: 14.2 g/dL (ref 12.0–15.0)
MCH: 30.7 pg (ref 26.0–34.0)
MCHC: 32.1 g/dL (ref 30.0–36.0)
MCV: 95.5 fL (ref 80.0–100.0)
Platelets: 327 10*3/uL (ref 150–400)
RBC: 4.63 MIL/uL (ref 3.87–5.11)
RDW: 13.9 % (ref 11.5–15.5)
WBC: 5.5 10*3/uL (ref 4.0–10.5)
nRBC: 0 % (ref 0.0–0.2)

## 2020-11-20 LAB — HEPARIN LEVEL (UNFRACTIONATED): Heparin Unfractionated: 0.74 IU/mL — ABNORMAL HIGH (ref 0.30–0.70)

## 2020-11-20 LAB — SARS CORONAVIRUS 2 (TAT 6-24 HRS): SARS Coronavirus 2: NEGATIVE

## 2020-11-20 MED ORDER — RIVAROXABAN 2.5 MG PO TABS: 2.5000 mg | ORAL_TABLET | Freq: Two times a day (BID) | ORAL | 0 refills | Status: AC

## 2020-11-20 MED ORDER — RIVAROXABAN 2.5 MG PO TABS
2.5000 mg | ORAL_TABLET | Freq: Two times a day (BID) | ORAL | Status: DC
  Administered 2020-11-20: 2.5 mg via ORAL
  Filled 2020-11-20 (×2): qty 1

## 2020-11-20 MED ORDER — OXYCODONE HCL 5 MG PO TABS: 2.5000 mg | ORAL_TABLET | Freq: Four times a day (QID) | ORAL | 0 refills | Status: AC | PRN

## 2020-11-20 MED ORDER — QUETIAPINE FUMARATE 25 MG PO TABS: 25.0000 mg | ORAL_TABLET | Freq: Every day | ORAL | 0 refills | Status: AC

## 2020-11-20 NOTE — Discharge Summary (Addendum)
Physician Discharge Summary  [name] AVW:[id number] DOB: Mar 27, 1928 DOA: 11/10/2020  PCP: [name], MD  Admit date: 11/10/2020 Discharge date: 11/20/2020  Admitted From: home  Disposition:  snf  Recommendations for Outpatient Follow-up:  1. Follow up with PCP in 1-2 weeks  Discharge Condition:stable  CODE STATUS:DNR Diet recommendation: regular   Brief/Interim Summary: 87 y.o.femalewith medical history significant ofHTN, PVD, PAOD, HLD. Presenting with left leg pain. Hx from granddtr at bedside. Her granddtr reports that the patient has been complaining of pain more frequently at home and having more difficulty with ambulation. Over the past several weeks to months, her granddaughter has noticed an overall gradual decline in the patient's ability to thrive in general. She also notes that the patient has been becoming more confused/forgetful at times (example provided was she is now forgetting to pay bills which was never an issue in the past). She also has been more dependent recently on pain medications due to the ongoing pain, spasms, cramping in her left leg. She is also not eating as much as previously due to pain associated with eating as well at times.  She was admitted for vascular surgery evaluation. After evaluation, her occlusive disease was considered rather remarkable and intervention would be high risk with the patient's underlying comorbidities, frailty, and overall debility.  Palliative care was recommended.   Discharge Diagnoses:  Active Problems:   Hypertension   Ischemic foot   PVD (peripheral vascular disease) (HCC)   Malnutrition of moderate degree   Goals of care, counseling/discussion   Palliative care by specialist   Altered mental status   Arterial occlusion   Severe PVD - patient has occluded celiac trunk with retrograde flow from SMA and PDA branches; calcified plaque at aortic bifurcation and origin of right CIA; right EIA heavily calcified  and small; junction of right EIA and proximal CFA has high-grade stenosis w/ short segment occlusion; calcified plaque in right CFA; no significant flow in right SFA (and R AKA noted); Left CIA is stented and occluded; left EIA has diffuse atherosclerotic disease; left CFA is heavily calcified but patent; left SFA occluded at origin (reconstitution of flow in popliteal artery above-knee); no significant flow in PTA; peroneal artery patent to ankle; areas of occlusion involving anterior tibial artery noted; no significant flow in dorsalis pedis artery -Has been evaluated by vascular surgery. Intervention would require overly aggressive measures (starting in right brachial artery possibly) - vascular surgery and Dr. Sabino Gasser both dicussed that palliative care is more appropriate at this time and intervention may be more harm than good at this point; patient is also starting to exhibit some signs of dementia per her granddaughter and she has been also not eating as much probably in part due to her mesenteric ischemia as well -Pt had been continued on heparin drip for now while in hospital given patient associating pain relief with this, discussed with pharmacy with recommendations to transition to xarelto on d/c, holding plavix -Continue oxycodone and modify as needed -Palliative care had been following. Appreciate assistance. Discussed with TOC. Plan for SNF  HTN -on amlodipine, losartan at home. On 11/14/2020, blood pressure soft; meds held and blood pressure has recovered -Continued losartanon 11/15/2020;BP remains stable at this time  HLD -On pravastatin currently. Does have severe PAD however mortality benefit from statin questionable at this point; if going to pursue less aggressive measures  Hx NSCLC  - stable; continue inhalers   Elevated troponin  - flat trend; low suspicion for ACS at  this nor would be a good candidate  Goals of Care -Appreciate input by Palliative Care, recs for  home with hospice noted -Pt reportedly taking in only 20-25% of meals. Had supplemented with gentle IVF   Discharge Instructions   Allergies as of 11/20/2020      Reactions   Aspirin Anaphylaxis   Morphine And Related Other (See Comments)   confusion      Medication List    STOP taking these medications   amLODipine 5 MG tablet Commonly known as: NORVASC   clopidogrel 75 MG tablet Commonly known as: PLAVIX     TAKE these medications   albuterol (2.5 MG/3ML) 0.083% nebulizer solution Commonly known as: PROVENTIL Take 2.5 mg by nebulization every 6 (six) hours as needed for wheezing or shortness of breath.   ProAir HFA 108 (90 Base) MCG/ACT inhaler Generic drug: albuterol Inhale 2 puffs into the lungs every 6 (six) hours as needed.   budesonide-formoterol 160-4.5 MCG/ACT inhaler Commonly known as: SYMBICORT Inhale 2 puffs into the lungs 2 (two) times daily.   diclofenac sodium 1 % Gel Commonly known as: VOLTAREN Apply 2 g topically 4 (four) times daily.   gabapentin 100 MG capsule Commonly known as: NEURONTIN Take 1 capsule (100 mg total) by mouth 3 (three) times daily.   losartan 50 MG tablet Commonly known as: COZAAR Take 50 mg by mouth daily.   Lumigan 0.01 % Soln Generic drug: bimatoprost Place 1 drop into both eyes at bedtime.   meclizine 25 MG tablet Commonly known as: ANTIVERT Take 25 mg by mouth every 8 (eight) hours as needed for dizziness (dizziness).   omeprazole 40 MG capsule Commonly known as: PRILOSEC Take 40 mg by mouth daily.   oxyCODONE 5 MG immediate release tablet Commonly known as: Roxicodone Take 0.5 tablets (2.5 mg total) by mouth every 6 (six) hours as needed for severe pain. What changed: how much to take   pravastatin 20 MG tablet Commonly known as: PRAVACHOL Take 20 mg by mouth at bedtime.   QUEtiapine 25 MG tablet Commonly known as: SEROQUEL Take 1 tablet (25 mg total) by mouth at bedtime.   Restasis 0.05 % ophthalmic  emulsion Generic drug: cycloSPORINE Place 2 drops into both eyes 2 (two) times daily.   rivaroxaban 2.5 MG Tabs tablet Commonly known as: XARELTO Take 1 tablet (2.5 mg total) by mouth 2 (two) times daily.   tiZANidine 2 MG tablet Commonly known as: ZANAFLEX Take 2 mg by mouth 2 (two) times daily as needed for muscle spasms.   Travatan Z 0.004 % Soln ophthalmic solution Generic drug: Travoprost (BAK Free) Place 1 drop into both eyes at bedtime.   Vitamin D3 25 MCG (1000 UT) Caps Take 1,000 capsules by mouth daily.       Contact information for after-discharge care    Destination    [facility] .   Service: Skilled Nursing Contact information: [place] [phone]                 Allergies  Allergen Reactions  . Aspirin Anaphylaxis  . Morphine And Related Other (See Comments)    confusion    Consultations:  Vascular Surgery  Palliative Care  Procedures/Studies: CT Angio Aortobifemoral W and/or Wo Contrast  Result Date: 11/10/2020 CLINICAL DATA:  Left foot pain and redness. EXAM: CT ANGIOGRAPHY OF ABDOMINAL AORTA WITH ILIOFEMORAL RUNOFF TECHNIQUE: Multidetector CT imaging of the abdomen, pelvis and lower extremities was performed using the standard  protocol during bolus administration of intravenous contrast. Multiplanar CT image reconstructions and MIPs were obtained to evaluate the vascular anatomy. CONTRAST:  162mL OMNIPAQUE IOHEXOL 350 MG/ML SOLN COMPARISON:  CT without contrast 11/07/2016 FINDINGS: VASCULAR Aorta: Diffuse atherosclerotic disease in the abdominal aorta without aneurysm or dissection. Mild narrowing in the distal abdominal aorta. Celiac: Origin of the celiac trunk is occluded. There is flow in the distal celiac trunk which is likely retrograde flow from the SMA and pancreatic-duodenal branches. GDA is prominent. SMA: SMA is patent. Prominent pancreatic-duodenal branches. Atherosclerotic disease  in the SMA. Renals: Atherosclerotic disease involving the bilateral renal arteries and difficult to exclude renal artery stenosis particularly at the origin of the right renal artery. No evidence for renal artery aneurysm or dissection. IMA: Patent RIGHT Lower Extremity Inflow: Large amount of calcified plaque at the aortic bifurcation and origin of the right common iliac artery. Right common iliac artery is patent with at least mild stenosis. Right external iliac artery is heavily calcified and small. There is evidence for high-grade stenosis or short segment occlusion near the junction of the right external iliac artery and proximal common femoral artery. Outflow: Limited evaluation of the right common femoral artery due to a large amount of calcified plaque. Flow in the right profunda femoral arteries. No significant flow in the right SFA. Above the knee amputation. LEFT Lower Extremity Inflow: Left common iliac artery has been stented at the origin. The left common iliac artery stent and artery are occluded. There is flow in the left internal and external iliac arteries. Diffuse atherosclerotic disease in the left external iliac artery. Outflow: Left common femoral artery is heavily calcified but patent. Flow in the left profunda femoral arteries. Left SFA is occluded at the origin. Reconstitution of flow in the popliteal artery above the knee. Runoff: Scattered calcifications involving the runoff vessels. No significant flow in the posterior tibial artery. Peroneal artery is patent down to the ankle. Areas of occlusion involving the anterior tibial artery. No significant flow in the dorsalis pedis artery. Veins: No obvious venous abnormality within the limitations of this arterial phase study. Review of the MIP images confirms the above findings. NON-VASCULAR Lower chest: Right heart is enlarged. Contrast refluxes into the hepatic veins and compatible with increased right heart pressures. Lung bases are clear.  Hepatobiliary: Normal appearance of the gallbladder. Normal appearance of the liver. Pancreas: Unremarkable. No pancreatic ductal dilatation or surrounding inflammatory changes. Spleen: Normal in size without focal abnormality. Adrenals/Urinary Tract: No gross abnormality to the adrenal glands. 2.2 cm low-density structure in left kidney lower pole is suggestive for a cyst. Small amount of fluid in the urinary bladder. Stomach/Bowel: Stomach is within normal limits. Appendix appears normal. No evidence of bowel wall thickening, distention, or inflammatory changes. Lymphatic: No lymph node enlargement in the abdomen or pelvis. Reproductive: There may be a small uterus but difficult to evaluate on this examination. No evidence for an adnexal mass. Other: Negative for ascites. Musculoskeletal: Multilevel degenerative disc disease in the lumbar spine. IMPRESSION: VASCULAR 1. Left inflow disease demonstrated by occlusion of the left common iliac artery and left common iliac artery stent. 2. Left outflow disease demonstrated by occlusion of the left SFA. There is reconstituted flow in the left popliteal artery. 3. Left runoff disease. Primary runoff in the left lower extremity is the left peroneal artery. 4. Right amputation above the knee. Occlusive disease involving the right common femoral artery but this area is difficult to evaluate due to heavily calcified plaque.  5. Diffuse atherosclerotic disease in the abdominal aorta. Aortic Atherosclerosis (ICD10-I70.0). 6. 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 7. Enlargement of the right heart with contrast refluxing into the hepatic veins. Findings are suggestive for increased right heart pressures. NON-VASCULAR 1. No acute abnormality in the abdomen or pelvis. Electronically Signed   By: Markus Daft M.D.   On: 11/10/2020 13:04   DG Chest Port 1 View  Result Date: 11/10/2020 CLINICAL DATA:  Fever, weakness EXAM:  PORTABLE CHEST 1 VIEW COMPARISON:  10/09/2020 FINDINGS: Heart and mediastinal contours are within normal limits. No focal opacities or effusions. No acute bony abnormality. Aortic atherosclerosis. IMPRESSION: No active disease. Electronically Signed   By: Rolm Baptise M.D.   On: 11/10/2020 10:52    Subjective: Without complaints  Discharge Exam: Vitals:   11/20/20 0929 11/20/20 1328  BP: (!) 143/68 (!) 138/110  Pulse: 69 79  Resp: 20 20  Temp: 98.6 F (37 C) 98 F (36.7 C)  SpO2: 98% 99%   Vitals:   11/19/20 2140 11/19/20 2359 11/20/20 0929 11/20/20 1328  BP:  (!) 130/59 (!) 143/68 (!) 138/110  Pulse:  70 69 79  Resp:  19 20 20   Temp:  97.8 F (36.6 C) 98.6 F (37 C) 98 F (36.7 C)  TempSrc:  Oral Oral Oral  SpO2: 99% 99% 98% 99%  Weight:      Height:        General: Pt is alert, awake, not in acute distress Cardiovascular: RRR, S1/S2 +, no rubs, no gallops Respiratory: CTA bilaterally, no wheezing, no rhonchi Abdominal: Soft, NT, ND, bowel sounds + Extremities: no edema, no cyanosis   The results of significant diagnostics from this hospitalization (including imaging, microbiology, ancillary and laboratory) are listed below for reference.     Microbiology: Recent Results (from the past 240 hour(s))  SARS CORONAVIRUS 2 (TAT 6-24 HRS) Nasopharyngeal Nasopharyngeal Swab     Status: None   Collection Time: 11/19/20  4:21 PM   Specimen: Nasopharyngeal Swab  Result Value Ref Range Status   SARS Coronavirus 2 NEGATIVE NEGATIVE Final    Comment: (NOTE) SARS-CoV-2 target nucleic acids are NOT DETECTED.  The SARS-CoV-2 RNA is generally detectable in upper and lower respiratory specimens during the acute phase of infection. Negative results do not preclude SARS-CoV-2 infection, do not rule out co-infections with other pathogens, and should not be used as the sole basis for treatment or other patient management decisions. Negative results must be combined with clinical  observations, patient history, and epidemiological information. The expected result is Negative.  Fact Sheet for Patients: SugarRoll.be  Fact Sheet for Healthcare Providers: https://www.woods-mathews.com/  This test is not yet approved or cleared by the [place] FDA and  has been authorized for detection and/or diagnosis of SARS-CoV-2 by FDA under an Emergency Use Authorization (EUA). This EUA will remain  in effect (meaning this test can be used) for the duration of the COVID-19 declaration under Se ction 564(b)(1) of the Act, 21 U.S.C. section 360bbb-3(b)(1), unless the authorization is terminated or revoked sooner.  Performed at [facility], [place] [address] [place], [place] [address]      Labs: BNP (last 3 results) No results for input(s): BNP in the last 8760 hours. Basic Metabolic Panel: Recent Labs  Lab 11/19/20 0003  NA 133*  K 4.3  CL 103  CO2 22  GLUCOSE 95  BUN 18  CREATININE 1.14*  CALCIUM 8.3*   Liver  Function Tests: No results for input(s): AST, ALT, ALKPHOS, BILITOT, PROT, ALBUMIN in the last 168 hours. No results for input(s): LIPASE, AMYLASE in the last 168 hours. No results for input(s): AMMONIA in the last 168 hours. CBC: Recent Labs  Lab 11/16/20 0030 11/17/20 0033 11/18/20 0152 11/19/20 0003 11/20/20 0209  WBC 6.3 5.8 5.0 5.2 5.5  HGB 12.0 12.4 12.7 12.1 14.2  HCT 36.6 37.8 39.0 37.3 44.2  MCV 95.8 93.8 94.0 94.7 95.5  PLT 276 269 286 290 327   Cardiac Enzymes: No results for input(s): CKTOTAL, CKMB, CKMBINDEX, TROPONINI in the last 168 hours. BNP: Invalid input(s): POCBNP CBG: No results for input(s): GLUCAP in the last 168 hours. D-Dimer No results for input(s): DDIMER in the last 72 hours. Hgb A1c No results for input(s): HGBA1C in the last 72 hours. Lipid Profile No results for input(s): CHOL, HDL, LDLCALC, TRIG, CHOLHDL, LDLDIRECT in the last 72 hours. Thyroid function  studies No results for input(s): TSH, T4TOTAL, T3FREE, THYROIDAB in the last 72 hours.  Invalid input(s): FREET3 Anemia work up No results for input(s): VITAMINB12, FOLATE, FERRITIN, TIBC, IRON, RETICCTPCT in the last 72 hours. Urinalysis    Component Value Date/Time   COLORURINE YELLOW 11/10/2020 1337   APPEARANCEUR CLEAR 11/10/2020 1337   LABSPEC >1.046 (H) 11/10/2020 1337   PHURINE 5.0 11/10/2020 1337   GLUCOSEU NEGATIVE 11/10/2020 1337   HGBUR NEGATIVE 11/10/2020 1337   BILIRUBINUR NEGATIVE 11/10/2020 1337   KETONESUR 5 (A) 11/10/2020 1337   PROTEINUR NEGATIVE 11/10/2020 1337   UROBILINOGEN 1.0 06/12/2013 1507   NITRITE NEGATIVE 11/10/2020 1337   LEUKOCYTESUR NEGATIVE 11/10/2020 1337   Sepsis Labs Invalid input(s): PROCALCITONIN,  WBC,  LACTICIDVEN Microbiology Recent Results (from the past 240 hour(s))  SARS CORONAVIRUS 2 (TAT 6-24 HRS) Nasopharyngeal Nasopharyngeal Swab     Status: None   Collection Time: 11/19/20  4:21 PM   Specimen: Nasopharyngeal Swab  Result Value Ref Range Status   SARS Coronavirus 2 NEGATIVE NEGATIVE Final    Comment: (NOTE) SARS-CoV-2 target nucleic acids are NOT DETECTED.  The SARS-CoV-2 RNA is generally detectable in upper and lower respiratory specimens during the acute phase of infection. Negative results do not preclude SARS-CoV-2 infection, do not rule out co-infections with other pathogens, and should not be used as the sole basis for treatment or other patient management decisions. Negative results must be combined with clinical observations, patient history, and epidemiological information. The expected result is Negative.  Fact Sheet for Patients: SugarRoll.be  Fact Sheet for Healthcare Providers: https://www.woods-mathews.com/  This test is not yet approved or cleared by the [place] FDA and  has been authorized for detection and/or diagnosis of SARS-CoV-2 by FDA under an Emergency  Use Authorization (EUA). This EUA will remain  in effect (meaning this test can be used) for the duration of the COVID-19 declaration under Se ction 564(b)(1) of the Act, 21 U.S.C. section 360bbb-3(b)(1), unless the authorization is terminated or revoked sooner.  Performed at [facility], [place] [address], [place], [place] [address]    Time spent: 30 min  SIGNED:   Marylu Lund, MD  [facility] 11/20/2020, 1:49 PM  If 7PM-7AM, please contact night-coverage

## 2020-11-20 NOTE — Progress Notes (Signed)
[facility] for Heparin Indication: PVD, possible ischemic foot  Labs: Recent Labs    11/18/20 0152 11/19/20 0003 11/20/20 0209  HGB 12.7 12.1 14.2  HCT 39.0 37.3 44.2  PLT 286 290 327  HEPARINUNFRC 0.24* 0.60 0.74*  CREATININE  --  1.14*  --     Estimated Creatinine Clearance: 28 mL/min (A) (by C-G formula based on SCr of 1.14 mg/dL (H)).   Assessment: 87 yo female with hx of PVD s/p amputation of RLE and L common iliac stenting admitted with L leg pain.  CT shows occlusions of the left common iliac and SFA.  Pharmacy is consulted to dose IV heparin. No procedures planned. Plans noted to continue heparin. Heparin level above goal, CBC stable. No issues with bleeding or infusion noted.  If oral anticoagulation considered then Xarelto 2.5mg  po bid may be suitable as this is the dosing use in PAD trials (COMPASS and VOYAGER). She was on plavix PTA (allergy noted to aspirin) but If apixaban is added would not continue plavix at discharge due to bleeding risk.   Goal of Therapy:  Heparin level 0.3-0.7 units/ml Monitor platelets by anticoagulation protocol: Yes   Plan:  -Reduce heparin to 850 units/hr -8 hour heparin level -Daily heparin level and CBC  Norina Buzzard, PharmD PGY1 Pharmacy Resident 11/20/2020 7:06 AM  **Pharmacist phone directory can now be found on [email or web address] ([email or web address]).  Listed under [place].

## 2020-11-20 NOTE — Progress Notes (Signed)
Discharged to [facility] report called

## 2020-11-20 NOTE — TOC Transition Note (Addendum)
Transition of Care [facility]) - CM/SW Discharge Note   Patient Details  Name: [name] MRN: [id number] Date of Birth: 03/27/1928  Transition of [facility] CM/SW Contact:  Ina Homes, [place] Phone Number: 11/20/2020, 10:25 AM   Clinical Narrative:     SW confirmed with Wynonia Lawman [facility] [phone]) they are able to accept pt today with [facility].   SW spoke with granddaughter and Silverio Decamp [phone]) aware of d/c plan and agreeable  SW confirmed with [facility]) plans to start care once pt at facility.   D/c summary sent via inbasket. PTAR called  Call report: [phone] Room: 203A   Final next level of care: [facility] (With [facility]) Barriers to Discharge: Barriers Resolved   Patient Goals and CMS Choice     Choice offered to / list presented to :  (Granddaughter)  Discharge Placement              Patient chooses bed at: [facility] Patient to be transferred to facility by: [place] Name of family member notified: [name] Patient and family notified of of transfer: 11/20/20  Discharge Plan and Services   Discharge Planning Services: CM Consult                                 Social Determinants of Health (SDOH) Interventions     Readmission Risk Interventions No flowsheet data found.

## 2020-12-29 DEATH — deceased

## 2021-01-21 ENCOUNTER — Encounter: Payer: Medicare Other | Admitting: Psychology

## 2021-02-02 ENCOUNTER — Encounter: Payer: Medicare Other | Admitting: Psychology
